# Patient Record
Sex: Female | Born: 1970
Health system: Southern US, Community
[De-identification: ages and names within clinical notes are randomized; demographics above are authoritative.]

## PROBLEM LIST (undated history)

## (undated) ENCOUNTER — Ambulatory Visit (HOSPITAL_COMMUNITY): Admission: EM | Payer: 59

## (undated) DIAGNOSIS — R011 Cardiac murmur, unspecified: Secondary | ICD-10-CM

## (undated) DIAGNOSIS — Z9889 Other specified postprocedural states: Secondary | ICD-10-CM

## (undated) DIAGNOSIS — J45909 Unspecified asthma, uncomplicated: Secondary | ICD-10-CM

## (undated) DIAGNOSIS — F32A Depression, unspecified: Secondary | ICD-10-CM

## (undated) DIAGNOSIS — E079 Disorder of thyroid, unspecified: Secondary | ICD-10-CM

## (undated) DIAGNOSIS — G43909 Migraine, unspecified, not intractable, without status migrainosus: Secondary | ICD-10-CM

## (undated) DIAGNOSIS — E063 Autoimmune thyroiditis: Secondary | ICD-10-CM

## (undated) DIAGNOSIS — L409 Psoriasis, unspecified: Secondary | ICD-10-CM

## (undated) DIAGNOSIS — K219 Gastro-esophageal reflux disease without esophagitis: Secondary | ICD-10-CM

## (undated) DIAGNOSIS — F329 Major depressive disorder, single episode, unspecified: Secondary | ICD-10-CM

## (undated) DIAGNOSIS — T7840XA Allergy, unspecified, initial encounter: Secondary | ICD-10-CM

## (undated) DIAGNOSIS — R112 Nausea with vomiting, unspecified: Secondary | ICD-10-CM

## (undated) DIAGNOSIS — M1990 Unspecified osteoarthritis, unspecified site: Secondary | ICD-10-CM

## (undated) DIAGNOSIS — R7303 Prediabetes: Secondary | ICD-10-CM

## (undated) DIAGNOSIS — Z8489 Family history of other specified conditions: Secondary | ICD-10-CM

## (undated) HISTORY — DX: Migraine, unspecified, not intractable, without status migrainosus: G43.909

## (undated) HISTORY — DX: Disorder of thyroid, unspecified: E07.9

## (undated) HISTORY — PX: BREAST SURGERY: SHX581

## (undated) HISTORY — PX: CHOLECYSTECTOMY: SHX55

## (undated) HISTORY — PX: WISDOM TOOTH EXTRACTION: SHX21

## (undated) HISTORY — DX: Psoriasis, unspecified: L40.9

## (undated) HISTORY — DX: Depression, unspecified: F32.A

## (undated) HISTORY — DX: Allergy, unspecified, initial encounter: T78.40XA

## (undated) HISTORY — DX: Autoimmune thyroiditis: E06.3

## (undated) HISTORY — PX: KNEE ARTHROSCOPY W/ LATERAL RELEASE: SHX1873

## (undated) HISTORY — PX: REDUCTION MAMMAPLASTY: SUR839

## (undated) HISTORY — DX: Unspecified asthma, uncomplicated: J45.909

## (undated) HISTORY — DX: Cardiac murmur, unspecified: R01.1

---
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
## 2015-12-13 ENCOUNTER — Encounter: Payer: Self-pay | Admitting: Gastroenterology

## 2019-07-29 ENCOUNTER — Ambulatory Visit (INDEPENDENT_AMBULATORY_CARE_PROVIDER_SITE_OTHER): Payer: No Typology Code available for payment source | Admitting: Family Medicine

## 2019-07-29 ENCOUNTER — Encounter: Payer: Self-pay | Admitting: Family Medicine

## 2019-07-29 DIAGNOSIS — M7918 Myalgia, other site: Secondary | ICD-10-CM | POA: Diagnosis not present

## 2019-07-29 DIAGNOSIS — J302 Other seasonal allergic rhinitis: Secondary | ICD-10-CM | POA: Diagnosis not present

## 2019-07-29 DIAGNOSIS — F988 Other specified behavioral and emotional disorders with onset usually occurring in childhood and adolescence: Secondary | ICD-10-CM | POA: Diagnosis not present

## 2019-07-29 DIAGNOSIS — G43909 Migraine, unspecified, not intractable, without status migrainosus: Secondary | ICD-10-CM

## 2019-07-29 DIAGNOSIS — L409 Psoriasis, unspecified: Secondary | ICD-10-CM | POA: Insufficient documentation

## 2019-07-29 DIAGNOSIS — M542 Cervicalgia: Secondary | ICD-10-CM | POA: Insufficient documentation

## 2019-07-29 DIAGNOSIS — E063 Autoimmune thyroiditis: Secondary | ICD-10-CM | POA: Insufficient documentation

## 2019-07-29 DIAGNOSIS — J45909 Unspecified asthma, uncomplicated: Secondary | ICD-10-CM | POA: Insufficient documentation

## 2019-07-29 DIAGNOSIS — Z7689 Persons encountering health services in other specified circumstances: Secondary | ICD-10-CM

## 2019-07-29 DIAGNOSIS — J4520 Mild intermittent asthma, uncomplicated: Secondary | ICD-10-CM

## 2019-07-29 NOTE — Progress Notes (Signed)
Virtual Visit via Video Note  I connected with Barbara Wood on 07/29/19 at 11:00 AM EDT by a video enabled telemedicine application and verified that I am speaking with the correct person using two identifiers. Location patient: home Location provider: home office Persons participating in the virtual visit: patient, provider  I discussed the limitations of evaluation and management by telemedicine and the availability of in person appointments. The patient expressed understanding and agreed to proceed.  Chief Complaint  Patient presents with  . Establish Care  . Allergies     HPI: Barbara Wood is a 49 y.o. female to establish care with our office. 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 She is married, 2 daughters (2 grandchildren), 3 dogs.  She has a h/o hypothyroidism, diagnosed in her 77's. She has allergies, asthma, and migraine headaches (complex migraines). She needs referral to allergy and neuro. Pt also notes lots of "arthritis issues" along with strong fam hx of autoimmune disorders. She has never seen rheum.  Pt states she is taking zyrtec BID, rhinocort 1 spray BID, singulair 10mg  daily but is still having significant allergy symptoms. This is triggering her asthma and is requiring her to use her albuterol inhaler once daily most days. She has a steroid inhaler but typically uses it spring and fall. She was recommended to have allergy shots in the past but has not been able to follow thru with this as she has moved often.   She takes skelaxin, phenergan or zofran, and rarely norco for migraines. She cannot tolerate triptans. She has taken 1 norco tab in the past 2 mo and less than 30 tabs per year. No refills needed at this time. She is on atenolol 100mg  BID for prophylaxis. She gets 1-2 migraines per week, previously 5/wk without atenolol. She tried botox injections in the past, but she had 3 days of consistent headache after each treatment.  She is interested  in finding a D.O. who does OMT specifically related to myofascial release treating cervical spine and upper thoracic region as muscular tightness in this area increases her migraine frequency.  She had ADD diagnosed via neuropsych testing and she has been on meds since college. She was on adderall x 3-4 years and has been on vyvanse 50mg  daily x 3 years.  Past Medical History:  Diagnosis Date  . Allergy Age 13  . Asthma Since age 89  . Depression Age 59  . Heart murmur Mitral valve prolapse age 78  . Thyroid disease Since age 23    Past Surgical History:  Procedure Laterality Date  . BREAST SURGERY  Reduction age 36  . CHOLECYSTECTOMY  Age 29    Family History  Problem Relation Age of Onset  . Asthma Sister   . Miscarriages / Stillbirths Sister   . Cancer Mother   . Depression Mother     Social History   Tobacco Use  . Smoking status: Never Smoker  . Smokeless tobacco: Never Used  Substance Use Topics  . Alcohol use: Yes    Comment: 1-2 drinks per month  . Drug use: Never     Current Outpatient Medications:  .  albuterol (PROVENTIL HFA) 108 (90 Base) MCG/ACT inhaler, , Disp: , Rfl:  .  ARIPiprazole (ABILIFY) 5 MG tablet, Take 1 tablet by mouth daily., Disp: , Rfl:  .  atenolol (TENORMIN) 100 MG tablet, Take 100 mg by mouth 2 (two) times daily., Disp: , Rfl:  .  Budesonide (RHINOCORT  ALLERGY NA), Use 1 spray in each nostril twice daily., Disp: , Rfl:  .  cetirizine (ZYRTEC ALLERGY) 10 MG tablet, Take 1 tablet by mouth 2 (two) times a day., Disp: , Rfl:  .  DICLOFENAC PO, Take 75 mg by mouth as needed., Disp: , Rfl:  .  HYDROcodone-acetaminophen (NORCO/VICODIN) 5-325 MG tablet, Take by mouth., Disp: , Rfl:  .  levothyroxine (SYNTHROID) 125 MCG tablet, Take 1 tablet by mouth daily., Disp: , Rfl:  .  lidocaine (LIDODERM) 5 %, , Disp: , Rfl:  .  liothyronine (CYTOMEL) 5 MCG tablet, Take 1 tablet by mouth 3 (three) times daily., Disp: , Rfl:  .  metaxalone (SKELAXIN) 800 MG  tablet, Take by mouth., Disp: , Rfl:  .  montelukast (SINGULAIR) 10 MG tablet, Take 1 tablet by mouth at bedtime., Disp: , Rfl:  .  promethazine (PHENERGAN) 25 MG tablet, , Disp: , Rfl:  .  traMADol (ULTRAM) 50 MG tablet, Take 100 mg by mouth 2 (two) times a day., Disp: , Rfl:  .  VYVANSE 50 MG capsule, Take 1 capsule by mouth daily., Disp: , Rfl:   Allergies  Allergen Reactions  . Amoxicillin-Pot Clavulanate Nausea And Vomiting  . Sertraline Other (See Comments)    hallucinations   . Sulfamethoxazole-Trimethoprim Hives  . Triptans Other (See Comments)    Chest pain       ROS: See pertinent positives and negatives per HPI.   EXAM:  VITALS per patient if applicable: There were no vitals taken for this visit.   GENERAL: alert, oriented, appears well and in no acute distress  HEENT: atraumatic, conjunctiva clear, no obvious abnormalities on inspection of external nose and ears  NECK: normal movements of the head and neck  LUNGS: on inspection no signs of respiratory distress, breathing rate appears normal, no obvious gross SOB, gasping or wheezing, no conversational dyspnea  CV: no obvious cyanosis  MS: moves all visible extremities without noticeable abnormality  PSYCH/NEURO: pleasant and cooperative, no obvious depression or anxiety, speech and thought processing grossly intact   ASSESSMENT AND PLAN: 1. Encounter to establish care with new doctor  2. Seasonal allergies - pt will increase use of nasal saline spray - pt will increase rhinocort to 2 sprays BID x 2 wks - switch from zyrtec to xyzal  - cont singulair, PRN benadryl - Ambulatory referral to Allergy  3. Myofascial pain - pt looking for D.O. who does OMT, specifically related to myofascial release to treat cervical and upper thoracic regions. I explained to pt that I do not do OMT but will check with Dr. Zigmund Daniel to see if he does  4. Migraine without status migrainosus, not intractable, unspecified  migraine type - cont atenolol 100mg  BID, as well as PRN skelaxin, norco, phenergan - Ambulatory referral to Neurology  5. Attention deficit disorder, unspecified hyperactivity presence - stable x 3 years on Vyvanse 50mg  daily - no refill needed at this time, but will refill when appropriate  6. Mild intermittent asthma without complication - cont with albuterol PRN - Ambulatory referral to Allergy    I discussed the assessment and treatment plan with the patient. The patient was provided an opportunity to ask questions and all were answered. The patient agreed with the plan and demonstrated an understanding of the instructions.   The patient was advised to call back or seek an in-person evaluation if the symptoms worsen or if the condition fails to improve as anticipated.   Letta Median, DO

## 2019-08-21 ENCOUNTER — Other Ambulatory Visit: Payer: Self-pay | Admitting: Family Medicine

## 2019-08-21 MED ORDER — NURTEC 75 MG PO TBDP: 75.0000 mg | ORAL_TABLET | Freq: Every day | ORAL | 1 refills | Status: DC | PRN

## 2019-08-21 MED ORDER — SYNTHROID 125 MCG PO TABS: 125.0000 ug | ORAL_TABLET | Freq: Every day | ORAL | 3 refills | Status: DC

## 2019-08-21 NOTE — Telephone Encounter (Signed)
Dr.C please advise

## 2019-08-21 NOTE — Telephone Encounter (Signed)
Both Rx sent to pharm on file

## 2019-08-21 NOTE — Telephone Encounter (Signed)
Requested medication (s) are due for refill today:  yes  Requested medication (s) are on the active medication list:  yes  Future visit scheduled:  No   Last Refill: historical provider  Note to Clinic:  Pt. Needs the brand name Synthroid.  Also is requesting Rx for [name] 75 mg. Tablet, for Migraine headache.  Stated she could not remember the name of this when she had Virtual appt. With Dr. Bryan Lemma.  Requested Prescriptions  Pending Prescriptions Disp Refills   levothyroxine (SYNTHROID) 125 MCG tablet       Sig: Take 1 tablet (125 mcg total) by mouth daily.     Endocrinology:  Hypothyroid Agents Failed - 08/21/2019 12:22 PM      Failed - TSH needs to be rechecked within 3 months after an abnormal result. Refill until TSH is due.      Failed - TSH in normal range and within 360 days    No results found for: TSH       Passed - Valid encounter within last 12 months    Recent Outpatient Visits          3 weeks ago Seasonal allergies   [facility], [place], [place]

## 2019-08-21 NOTE — Telephone Encounter (Signed)
Call placed to pt. To discuss her request for refill on Levothyroxine and [name].  Pt. wanted to make Dr. Bryan Lemma aware of the name of the migraine medication -[name] 75 mg.  Reported her previous dose was to take one tab prn for migraine headache, and could take up to 2 x/ day.  Also stated she needs to have the brand name Synthroid, and not the generic.   Will forward pt's request to Dr. Bryan Lemma.

## 2019-08-21 NOTE — Telephone Encounter (Signed)
Sent mychart message to pt to make her aware.

## 2019-08-21 NOTE — Telephone Encounter (Signed)
Medication: levothyroxine (SYNTHROID) 125 MCG tablet [id number] , Nurtic 75mg -Dr C. Is not the historical provider for these medications.   Has the patient contacted their pharmacy? Yes  (Agent: If no, request that the patient contact the pharmacy for the refill.) (Agent: If yes, when and what did the pharmacy advise?)  Preferred Pharmacy (with phone number or street name): [id number] - [place], [place] [phone] (Phone) [phone] (Fax    Agent: Please be advised that RX refills may take up to 3 business days. We ask that you follow-up with your pharmacy.

## 2019-09-02 ENCOUNTER — Ambulatory Visit: Payer: No Typology Code available for payment source | Admitting: Allergy

## 2019-09-03 ENCOUNTER — Telehealth: Payer: Self-pay | Admitting: Family Medicine

## 2019-09-03 DIAGNOSIS — L0292 Furuncle, unspecified: Secondary | ICD-10-CM

## 2019-09-03 MED ORDER — CEPHALEXIN 500 MG PO CAPS: 500.0000 mg | ORAL_CAPSULE | Freq: Two times a day (BID) | ORAL | 0 refills | Status: AC

## 2019-09-03 NOTE — Telephone Encounter (Signed)
Patient called and complains of a "bad boil" on her lower abdomen x 3 days. Increasing pain, redness, slightly warm to touch. No drainage. No fever, chills, n/v. Pt had not done anything or applied anything. 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 Agreed to try abx and warm compress BID but advised pt if symptoms worsen (spreading redness, fever, chills, increasing pain) especially after 48 hrs on abx, she need to be seen. Will send Rx for keflex x 10 days to pharm on file. Pt will start warm compress BID and ibuprofen.

## 2019-09-10 ENCOUNTER — Telehealth: Payer: Self-pay

## 2019-09-10 NOTE — Telephone Encounter (Signed)
Questions for Screening COVID-19  Symptom onset: None  Travel or Contacts: None  During this illness, did/does the patient experience any of the following symptoms? Fever >100.62F []   Yes [x]   No []   Unknown Subjective fever (felt feverish) []   Yes [x]   No []   Unknown Chills []   Yes [x]   No []   Unknown Muscle aches (myalgia) []   Yes [x]   No []   Unknown Runny nose (rhinorrhea) []   Yes [x]   No []   Unknown Sore throat []   Yes [x]   No []   Unknown Cough (new onset or worsening of chronic cough) []   Yes [x]   No []   Unknown Shortness of breath (dyspnea) []   Yes [x]   No []   Unknown Nausea or vomiting []   Yes [x]   No []   Unknown Headache []   Yes [x]   No []   Unknown Abdominal pain  []   Yes [x]   No []   Unknown Diarrhea (?3 loose/looser than normal stools/24hr period) []   Yes [x]   No []   Unknown Other, specify:  Patient risk factors: Smoker? []   Current []   Former []   Never If female, currently pregnant? []   Yes []   No  Patient Active Problem List   Diagnosis Date Noted  . Asthma 07/29/2019  . Hashimoto's disease 07/29/2019  . Myofascial pain 07/29/2019  . Neck pain 07/29/2019  . Psoriasis 07/29/2019  . Migraine 07/26/1986    Plan:  []   High risk for COVID-19 with red flags go to ED (with CP, SOB, weak/lightheaded, or fever > 101.5). Call ahead.  []   High risk for COVID-19 but stable. Inform provider and coordinate time for [facility] visit.   []   No red flags but URI signs or symptoms okay for [facility] visit.

## 2019-09-11 ENCOUNTER — Ambulatory Visit: Payer: No Typology Code available for payment source | Admitting: Family Medicine

## 2019-09-21 ENCOUNTER — Other Ambulatory Visit: Payer: Self-pay | Admitting: Family Medicine

## 2019-09-21 ENCOUNTER — Encounter: Payer: Self-pay | Admitting: Family Medicine

## 2019-09-21 MED ORDER — LISDEXAMFETAMINE DIMESYLATE 50 MG PO CAPS: 50.0000 mg | ORAL_CAPSULE | Freq: Every day | ORAL | 0 refills | Status: DC

## 2019-10-07 ENCOUNTER — Encounter: Payer: Self-pay | Admitting: Family Medicine

## 2019-10-08 ENCOUNTER — Encounter: Payer: Self-pay | Admitting: Podiatry

## 2019-10-08 ENCOUNTER — Other Ambulatory Visit: Payer: Self-pay

## 2019-10-08 ENCOUNTER — Ambulatory Visit: Payer: No Typology Code available for payment source | Admitting: Podiatry

## 2019-10-08 VITALS — BP 103/60 | HR 65

## 2019-10-08 DIAGNOSIS — M79671 Pain in right foot: Secondary | ICD-10-CM | POA: Diagnosis not present

## 2019-10-08 DIAGNOSIS — G5761 Lesion of plantar nerve, right lower limb: Secondary | ICD-10-CM | POA: Diagnosis not present

## 2019-10-08 MED ORDER — TRAMADOL HCL 50 MG PO TABS: 100.0000 mg | ORAL_TABLET | Freq: Two times a day (BID) | ORAL | 0 refills | Status: DC

## 2019-10-08 NOTE — Patient Instructions (Signed)

## 2019-10-09 ENCOUNTER — Other Ambulatory Visit: Payer: Self-pay | Admitting: Family Medicine

## 2019-10-09 ENCOUNTER — Encounter: Payer: Self-pay | Admitting: Family Medicine

## 2019-10-09 MED ORDER — TRAMADOL HCL 50 MG PO TABS: 100.0000 mg | ORAL_TABLET | Freq: Two times a day (BID) | ORAL | 0 refills | Status: DC

## 2019-10-09 NOTE — Progress Notes (Signed)
Rx sent yesterday was for #60 which is 15 days supply for pt, not 30 day. New Rx sent now with correct tab number

## 2019-10-19 NOTE — Progress Notes (Signed)
Subjective:   Patient ID: Barbara Wood, female   DOB: 50 y.o.   MRN: [id number]   HPI 49 year old female presents the office today for concerns of right foot pain and concern for neuroma.  She states that she gets burning and numbness of her toes particularly when hiking.  During the day when she is working she does not get significant discomfort.  She had no recent treatment.  No other complaints.   Review of Systems  All other systems reviewed and are negative.  Past Medical History:  Diagnosis Date  . Allergy Age 74  . Asthma Since age 67  . Depression Age 58  . Heart murmur Mitral valve prolapse age 69  . Thyroid disease Since age 23    Past Surgical History:  Procedure Laterality Date  . BREAST SURGERY  Reduction age 81  . CHOLECYSTECTOMY  Age 84     Current Outpatient Medications:  .  albuterol (PROVENTIL HFA) 108 (90 Base) MCG/ACT inhaler, , Disp: , Rfl:  .  ARIPiprazole (ABILIFY) 5 MG tablet, Take 1 tablet by mouth daily., Disp: , Rfl:  .  atenolol (TENORMIN) 100 MG tablet, Take 100 mg by mouth 2 (two) times daily., Disp: , Rfl:  .  Budesonide (RHINOCORT ALLERGY NA), Use 1 spray in each nostril twice daily., Disp: , Rfl:  .  budesonide-formoterol (SYMBICORT) 160-4.5 MCG/ACT inhaler, , Disp: , Rfl:  .  cetirizine (ZYRTEC ALLERGY) 10 MG tablet, Take 1 tablet by mouth 2 (two) times a day., Disp: , Rfl:  .  diclofenac (VOLTAREN) 75 MG EC tablet, , Disp: , Rfl:  .  DICLOFENAC PO, Take 75 mg by mouth as needed., Disp: , Rfl:  .  fluconazole (DIFLUCAN) 150 MG tablet, TAKE 1 TABLET BY MOUTH EVERY 3 DAYS FOR 2 DOSES, Disp: , Rfl:  .  Fluocinolone Acetonide Scalp 0.01 % OIL, , Disp: , Rfl:  .  HYDROcodone-acetaminophen (NORCO/VICODIN) 5-325 MG tablet, Take by mouth., Disp: , Rfl:  .  lidocaine (LIDODERM) 5 %, , Disp: , Rfl:  .  liothyronine (CYTOMEL) 5 MCG tablet, Take 1 tablet by mouth 3 (three) times daily., Disp: , Rfl:  .  lisdexamfetamine (VYVANSE) 50 MG capsule, Take 1  capsule (50 mg total) by mouth daily., Disp: 30 capsule, Rfl: 0 .  metaxalone (SKELAXIN) 800 MG tablet, Take by mouth., Disp: , Rfl:  .  montelukast (SINGULAIR) 10 MG tablet, Take 1 tablet by mouth at bedtime., Disp: , Rfl:  .  promethazine (PHENERGAN) 25 MG tablet, , Disp: , Rfl:  .  Rimegepant Sulfate ([name]) 75 MG TBDP, Take 75 mg by mouth daily as needed., Disp: 30 tablet, Rfl: 1 .  SYNTHROID 125 MCG tablet, Take 1 tablet (125 mcg total) by mouth daily before breakfast., Disp: 90 tablet, Rfl: 3 .  traMADol (ULTRAM) 50 MG tablet, Take 2 tablets (100 mg total) by mouth 2 (two) times daily., Disp: 120 tablet, Rfl: 0  Allergies  Allergen Reactions  . Amoxicillin-Pot Clavulanate Nausea And Vomiting  . Sertraline Other (See Comments)    hallucinations   . Sulfamethoxazole-Trimethoprim Hives  . Triptans Other (See Comments)    Chest pain          Objective:  Physical Exam  General: AAO x3, NAD  Dermatological: Skin is warm, dry and supple bilateral. Nails x 10 are well manicured; remaining integument appears unremarkable at this time. There are no open sores, no preulcerative lesions, no rash or signs of infection present.  Vascular: Dorsalis  Pedis artery and Posterior Tibial artery pedal pulses are 2/4 bilateral with immedate capillary fill time. Pedal hair growth present. No varicosities and no lower extremity edema present bilateral. There is no pain with calf compression, swelling, warmth, erythema.   Neruologic: Grossly intact via light touch bilateral. Vibratory intact via tuning fork bilateral. Protective threshold with Semmes Wienstein monofilament intact to all pedal sites bilateral.  Negative Tinel sign.  Musculoskeletal: There is tenderness on the third interspace of the right foot and there is a palpable neuroma identified with a positive click.  There is no area pinpoint tenderness.  No significant edema.  Muscular strength 5/5 in all groups tested bilateral.  Gait:  Unassisted, Nonantalgic.       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  -Steroid injection performed today.  See procedure note below.  Neuroma pad.  Discussed shoe modifications as well. We discussed surgical intervention if needed.  Also discussed dehydrated alcohol injection.  Procedure: Injection Tendon/Ligament Discussed alternatives, risks, complications and verbal consent was obtained.  Location: Right foot neuroma Skin Prep: Alcohol Injectate: 0.5cc 0.5% marcaine plain, 0.5 cc 2% lidocaine plain and, 1 cc kenalog 10. Disposition: Patient tolerated procedure well. Injection site dressed with a band-aid.  Post-injection care was discussed and return precautions discussed.   Trula Slade DPM

## 2019-10-22 ENCOUNTER — Other Ambulatory Visit: Payer: Self-pay

## 2019-10-22 ENCOUNTER — Ambulatory Visit (INDEPENDENT_AMBULATORY_CARE_PROVIDER_SITE_OTHER): Payer: 59 | Admitting: Family Medicine

## 2019-10-22 ENCOUNTER — Encounter: Payer: Self-pay | Admitting: Family Medicine

## 2019-10-22 DIAGNOSIS — G43909 Migraine, unspecified, not intractable, without status migrainosus: Secondary | ICD-10-CM

## 2019-10-22 DIAGNOSIS — K529 Noninfective gastroenteritis and colitis, unspecified: Secondary | ICD-10-CM

## 2019-10-22 MED ORDER — PROMETHAZINE HCL 25 MG PO TABS: 25.0000 mg | ORAL_TABLET | Freq: Four times a day (QID) | ORAL | 3 refills | Status: DC | PRN

## 2019-10-22 NOTE — Progress Notes (Signed)
Virtual Visit via Telephone Note  I connected with Barbara Wood on 10/22/19 at  1:30 PM EDT by telephone and verified that I am speaking with the correct person using two identifiers.   I discussed the limitations, risks, security and privacy concerns of performing an evaluation and management service by telephone and the availability of in person appointments. I also discussed with the patient that there may be a patient responsible charge related to this service. The patient expressed understanding and agreed to proceed.  Location patient: home Location provider: work or home office Participants present for the call: patient, provider Patient did not have a visit in the prior 7 days to address this/these issue(s).  Chief Complaint  Patient presents with  . Nausea  . Emesis  . Diarrhea    History of Present Illness: Barbara Wood is a 49 y.o. female who complains of 3 days of n/v/d, chills. Pt denies fever, headache, cough, SOB, myalgias, rash. She denies blood in stool. No abdominal cramping. Vomiting has resolved. Still w/ nausea and diarrhea. covid test - negative. Pt states she was exposed last week to a patient who has since tested positive for covid.  She has phenergan for nausea and states that helps but does make her very tired. zofran causes her significant constipation.  No recent abx use. Pt would like to return to work Monday and requests note for this.  Past Medical History:  Diagnosis Date  . Allergy Age 79  . Asthma Since age 49  . Depression Age 50  . Heart murmur Mitral valve prolapse age 11  . Thyroid disease Since age 69    Past Surgical History:  Procedure Laterality Date  . BREAST SURGERY  Reduction age 3  . CHOLECYSTECTOMY  Age 31    Social History   Tobacco Use  . Smoking status: Never Smoker  . Smokeless tobacco: Never Used  Substance Use Topics  . Alcohol use: Yes    Comment: 1-2 drinks per month  . Drug use: Never    Family History  Problem  Relation Age of Onset  . Asthma Sister   . Miscarriages / Stillbirths Sister   . Cancer Mother   . Depression Mother     Outpatient Encounter Medications as of 10/22/2019  Medication Sig  . albuterol (PROVENTIL HFA) 108 (90 Base) MCG/ACT inhaler   . ARIPiprazole (ABILIFY) 5 MG tablet Take 1 tablet by mouth daily.  Marland Kitchen atenolol (TENORMIN) 100 MG tablet Take 100 mg by mouth 2 (two) times daily.  . Budesonide (RHINOCORT ALLERGY NA) Use 1 spray in each nostril twice daily.  . budesonide-formoterol (SYMBICORT) 160-4.5 MCG/ACT inhaler   . cetirizine (ZYRTEC ALLERGY) 10 MG tablet Take 1 tablet by mouth 2 (two) times a day.  . diclofenac (VOLTAREN) 75 MG EC tablet   . DICLOFENAC PO Take 75 mg by mouth as needed.  . fluconazole (DIFLUCAN) 150 MG tablet TAKE 1 TABLET BY MOUTH EVERY 3 DAYS FOR 2 DOSES  . Fluocinolone Acetonide Scalp 0.01 % OIL   . HYDROcodone-acetaminophen (NORCO/VICODIN) 5-325 MG tablet Take by mouth.  . lidocaine (LIDODERM) 5 %   . liothyronine (CYTOMEL) 5 MCG tablet Take 1 tablet by mouth 3 (three) times daily.  Marland Kitchen lisdexamfetamine (VYVANSE) 50 MG capsule Take 1 capsule (50 mg total) by mouth daily.  . metaxalone (SKELAXIN) 800 MG tablet Take by mouth.  . montelukast (SINGULAIR) 10 MG tablet Take 1 tablet by mouth at bedtime.  . promethazine (PHENERGAN) 25 MG tablet Take  1 tablet (25 mg total) by mouth every 6 (six) hours as needed for nausea or vomiting.  . Rimegepant Sulfate ([name]) 75 MG TBDP Take 75 mg by mouth daily as needed.  Marland Kitchen SYNTHROID 125 MCG tablet Take 1 tablet (125 mcg total) by mouth daily before breakfast.  . traMADol (ULTRAM) 50 MG tablet Take 2 tablets (100 mg total) by mouth 2 (two) times daily.  . [DISCONTINUED] promethazine (PHENERGAN) 25 MG tablet    No facility-administered encounter medications on file as of 10/22/2019.      Allergies  Allergen Reactions  . Amoxicillin-Pot Clavulanate Nausea And Vomiting  . Sertraline Other (See Comments)     hallucinations   . Sulfamethoxazole-Trimethoprim Hives  . Triptans Other (See Comments)    Chest pain       ROS: See pertinent positives and negatives per HPI.   Observations/Objective: Patient sounds cheerful and well on the phone. I do not appreciate any SOB. Speech and thought processing are grossly intact. Patient reported vitals:  There were no vitals taken for this visit.   Assessment and Plan:  1. Migraine without status migrainosus, not intractable, unspecified migraine type Refill: - promethazine (PHENERGAN) 25 MG tablet; Take 1 tablet (25 mg total) by mouth every 6 (six) hours as needed for nausea or vomiting.  Dispense: 30 tablet; Refill: 3  2. Gastroenteritis - x 3 days, slow improvement - cont with increased fluid intake, BRAT diet, rest Refill: - promethazine (PHENERGAN) 25 MG tablet; Take 1 tablet (25 mg total) by mouth every 6 (six) hours as needed for nausea or vomiting.  Dispense: 30 tablet; Refill: 3 - work note done and sent via MyChart to patient - f/u PRN or if symptoms worsen or do not continue to improve over the next 3-4 days  I did not refer this patient for an OV in the next 24 hours for this/these issue(s).  I discussed the assessment and treatment plan with the patient. The patient was provided an opportunity to ask questions and all were answered. The patient agreed with the plan and demonstrated an understanding of the instructions.   The patient was advised to call back or seek an in-person evaluation if the symptoms worsen or if the condition fails to improve as anticipated.  I provided 11 minutes of non-face-to-face time during this encounter.   Letta Median, DO

## 2019-11-02 ENCOUNTER — Other Ambulatory Visit: Payer: Self-pay

## 2019-11-02 ENCOUNTER — Ambulatory Visit: Payer: No Typology Code available for payment source | Admitting: Podiatry

## 2019-11-02 DIAGNOSIS — G5761 Lesion of plantar nerve, right lower limb: Secondary | ICD-10-CM | POA: Diagnosis not present

## 2019-11-02 NOTE — Progress Notes (Signed)
Subjective: 49 year old female presents the office today for evaluation of right foot neuroma.  Overall she said that she is doing better the injection was helpful.  She states that she was wearing her hiking boots today and she is able to wear them for about 3-1/2 hours before she started get numbness of the third and fourth toes.  No significant burning. Denies any systemic complaints such as fevers, chills, nausea, vomiting. No acute changes since last appointment, and no other complaints at this time.   Objective: AAO x3, NAD DP/PT pulses palpable bilaterally, CRT less than 3 seconds There is tenderness on the third interspace of the right foot.  There is a clicking sensation with medial to lateral compression metatarsals.  No area pinpoint tenderness.  No edema, erythema.  No pain with calf compression, swelling, warmth, erythema  Assessment: Right foot neuroma  Plan: -All treatment options discussed with the patient including all alternatives, risks, complications.  -Steroid injection performed today.  See procedure note below.  Dispensed and applied neuroma pads to her hiking boot insert.  We discussed other treatment options including alcohol sclerosing injections but she does not want to do this.  She states that if we have to go that route she would rather have surgery.  Discussed with her the surgery.  If she elects to have surgery we will get an ultrasound for surgical planning. -Patient encouraged to call the office with any questions, concerns, change in symptoms.   Trula Slade DPM

## 2019-11-02 NOTE — Patient Instructions (Signed)

## 2019-11-09 ENCOUNTER — Other Ambulatory Visit: Payer: Self-pay

## 2019-11-09 MED ORDER — LIOTHYRONINE SODIUM 5 MCG PO TABS: 5.0000 ug | ORAL_TABLET | Freq: Three times a day (TID) | ORAL | 2 refills | Status: DC

## 2019-11-16 ENCOUNTER — Emergency Department (HOSPITAL_COMMUNITY): Payer: 59

## 2019-11-16 ENCOUNTER — Other Ambulatory Visit: Payer: Self-pay

## 2019-11-16 ENCOUNTER — Emergency Department (HOSPITAL_COMMUNITY)
Admission: EM | Admit: 2019-11-16 | Discharge: 2019-11-17 | Disposition: A | Discharge status: Home / Self Care | Payer: 59 | Attending: Emergency Medicine | Admitting: Emergency Medicine

## 2019-11-16 ENCOUNTER — Other Ambulatory Visit: Payer: Self-pay | Admitting: Family Medicine

## 2019-11-16 ENCOUNTER — Encounter (HOSPITAL_COMMUNITY): Payer: Self-pay | Admitting: Emergency Medicine

## 2019-11-16 ENCOUNTER — Encounter: Payer: Self-pay | Admitting: Family Medicine

## 2019-11-16 DIAGNOSIS — Z79899 Other long term (current) drug therapy: Secondary | ICD-10-CM | POA: Diagnosis not present

## 2019-11-16 DIAGNOSIS — J4541 Moderate persistent asthma with (acute) exacerbation: Secondary | ICD-10-CM | POA: Diagnosis not present

## 2019-11-16 DIAGNOSIS — Z20828 Contact with and (suspected) exposure to other viral communicable diseases: Secondary | ICD-10-CM | POA: Insufficient documentation

## 2019-11-16 DIAGNOSIS — Z20822 Contact with and (suspected) exposure to covid-19: Secondary | ICD-10-CM

## 2019-11-16 DIAGNOSIS — R000 Tachycardia, unspecified: Secondary | ICD-10-CM | POA: Diagnosis not present

## 2019-11-16 DIAGNOSIS — J40 Bronchitis, not specified as acute or chronic: Secondary | ICD-10-CM

## 2019-11-16 DIAGNOSIS — R0602 Shortness of breath: Secondary | ICD-10-CM | POA: Diagnosis not present

## 2019-11-16 DIAGNOSIS — R05 Cough: Secondary | ICD-10-CM | POA: Diagnosis not present

## 2019-11-16 LAB — CBC WITH DIFFERENTIAL/PLATELET
Abs Immature Granulocytes: 0.04 10*3/uL (ref 0.00–0.07)
Basophils Absolute: 0 10*3/uL (ref 0.0–0.1)
Basophils Relative: 0 %
Eosinophils Absolute: 0 10*3/uL (ref 0.0–0.5)
Eosinophils Relative: 0 %
HCT: 38.9 % (ref 36.0–46.0)
Hemoglobin: 12.6 g/dL (ref 12.0–15.0)
Immature Granulocytes: 0 %
Lymphocytes Relative: 10 %
Lymphs Abs: 1 10*3/uL (ref 0.7–4.0)
MCH: 28.2 pg (ref 26.0–34.0)
MCHC: 32.4 g/dL (ref 30.0–36.0)
MCV: 87 fL (ref 80.0–100.0)
Monocytes Absolute: 0.3 10*3/uL (ref 0.1–1.0)
Monocytes Relative: 3 %
Neutro Abs: 8.4 10*3/uL — ABNORMAL HIGH (ref 1.7–7.7)
Neutrophils Relative %: 87 %
Platelets: 287 10*3/uL (ref 150–400)
RBC: 4.47 MIL/uL (ref 3.87–5.11)
RDW: 12.4 % (ref 11.5–15.5)
WBC: 9.8 10*3/uL (ref 4.0–10.5)
nRBC: 0 % (ref 0.0–0.2)

## 2019-11-16 LAB — D-DIMER, QUANTITATIVE: D-Dimer, Quant: 0.3 ug/mL-FEU (ref 0.00–0.50)

## 2019-11-16 LAB — BASIC METABOLIC PANEL
Anion gap: 12 (ref 5–15)
BUN: 12 mg/dL (ref 6–20)
CO2: 20 mmol/L — ABNORMAL LOW (ref 22–32)
Calcium: 9.4 mg/dL (ref 8.9–10.3)
Chloride: 108 mmol/L (ref 98–111)
Creatinine, Ser: 0.78 mg/dL (ref 0.44–1.00)
GFR calc Af Amer: >60 mL/min (ref >60)
GFR calc non Af Amer: >60 mL/min (ref >60)
Glucose, Bld: 173 mg/dL — ABNORMAL HIGH (ref 70–99)
Potassium: 4 mmol/L (ref 3.5–5.1)
Sodium: 140 mmol/L (ref 135–145)

## 2019-11-16 LAB — HEPATIC FUNCTION PANEL
ALT: 18 U/L (ref 0–44)
AST: 23 U/L (ref 15–41)
Albumin: 3.6 g/dL (ref 3.5–5.0)
Alkaline Phosphatase: 81 U/L (ref 38–126)
Bilirubin, Direct: 0.3 mg/dL — ABNORMAL HIGH (ref 0.0–0.2)
Indirect Bilirubin: 0.6 mg/dL (ref 0.3–0.9)
Total Bilirubin: 0.9 mg/dL (ref 0.3–1.2)
Total Protein: 7.1 g/dL (ref 6.5–8.1)

## 2019-11-16 LAB — TROPONIN I (HIGH SENSITIVITY): Troponin I (High Sensitivity): 3 ng/L (ref <18)

## 2019-11-16 MED ORDER — ALBUTEROL SULFATE (2.5 MG/3ML) 0.083% IN NEBU: 5.0000 mg | INHALATION_SOLUTION | Freq: Once | RESPIRATORY_TRACT | Status: DC

## 2019-11-16 MED ORDER — PREDNISONE 20 MG PO TABS: ORAL_TABLET | ORAL | 0 refills | Status: DC

## 2019-11-16 MED ORDER — ALBUTEROL SULFATE HFA 108 (90 BASE) MCG/ACT IN AERS
6.0000 | INHALATION_SPRAY | Freq: Once | RESPIRATORY_TRACT | Status: AC
  Administered 2019-11-16: 23:00:00 6 via RESPIRATORY_TRACT
  Filled 2019-11-16: qty 6.7

## 2019-11-16 MED ORDER — MAGNESIUM SULFATE 2 GM/50ML IV SOLN
2.0000 g | Freq: Once | INTRAVENOUS | Status: AC
  Administered 2019-11-16: 2 g via INTRAVENOUS
  Filled 2019-11-16: qty 50

## 2019-11-16 MED ORDER — SODIUM CHLORIDE 0.9 % IV SOLN
Freq: Once | INTRAVENOUS | Status: AC
  Administered 2019-11-16: 23:00:00 via INTRAVENOUS

## 2019-11-16 MED ORDER — DIPHENHYDRAMINE HCL 25 MG PO CAPS
50.0000 mg | ORAL_CAPSULE | Freq: Once | ORAL | Status: AC
  Administered 2019-11-17: 50 mg via ORAL
  Filled 2019-11-16: qty 2

## 2019-11-16 MED ORDER — DEXAMETHASONE SODIUM PHOSPHATE 10 MG/ML IJ SOLN
10.0000 mg | Freq: Once | INTRAMUSCULAR | Status: AC
  Administered 2019-11-17: 10 mg via INTRAVENOUS
  Filled 2019-11-16: qty 1

## 2019-11-16 MED ORDER — AEROCHAMBER PLUS FLO-VU LARGE MISC
1.0000 | Freq: Once | Status: AC
  Administered 2019-11-16: 23:00:00 1

## 2019-11-16 MED ORDER — ALBUTEROL SULFATE HFA 108 (90 BASE) MCG/ACT IN AERS: 2.0000 | INHALATION_SPRAY | RESPIRATORY_TRACT | 3 refills | Status: DC | PRN

## 2019-11-16 MED ORDER — BENZONATATE 100 MG PO CAPS: 100.0000 mg | ORAL_CAPSULE | Freq: Three times a day (TID) | ORAL | 1 refills | Status: DC | PRN

## 2019-11-16 MED ORDER — IPRATROPIUM BROMIDE HFA 17 MCG/ACT IN AERS
2.0000 | INHALATION_SPRAY | Freq: Once | RESPIRATORY_TRACT | Status: AC
  Administered 2019-11-17: 01:00:00 2 via RESPIRATORY_TRACT
  Filled 2019-11-16: qty 12.9

## 2019-11-16 NOTE — ED Provider Notes (Signed)
[facility] EMERGENCY DEPARTMENT Provider Note   CSN: [id number] Arrival date & time: 11/16/19  2137     History   Chief Complaint Chief Complaint  Patient presents with  . Shortness of Breath    HPI Barbara Wood is a 50 y.o. female with a past medical history of Hashimoto's disease, asthma, depression, ADD, who presents today for evaluation of cough and shortness of breath.  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   She reports that 3 days ago she started developing cough and shortness of breath.  Her cough is productive, she reports it is producing foul tasting sputum that takes like prednisone.  She denies any fevers.  She reports she has taken hydrocodone cough syrup without significant relief in her symptoms.  She reports that her daughter and husband are both sick also, however states that they do not have asthma and her not as severe as she is.  She reports that her sense of taste is diminished, however her sense of smell is increased.  She reports that she coughs so much that she gags, however denies vomiting, constipation, or diarrhea.  No abdominal pain.  She reports that she took 60 mg of prednisone p.o. this morning.  She is also taking a total of 75 mg of Benadryl today.  She has been using her albuterol nebulizer once, and her albuterol inhaler 2 puffs 3 times today without significant relief.  She has never required hospitalization, ER visits, BiPAP or intubation for her asthma before.  She denies any new exposures.       HPI  Past Medical History:  Diagnosis Date  . Allergy Age 53  . Asthma Since age 28  . Depression Age 51  . Heart murmur Mitral valve prolapse age 44  . Thyroid disease Since age 65    Patient Active Problem List   Diagnosis Date Noted  . Asthma 07/29/2019  . Hashimoto's disease 07/29/2019  . Myofascial pain 07/29/2019  . Neck pain 07/29/2019  . Psoriasis 07/29/2019  . Migraine 07/26/1986    Past  Surgical History:  Procedure Laterality Date  . BREAST SURGERY  Reduction age 77  . CHOLECYSTECTOMY  Age 79     OB History   No obstetric history on file.      Home Medications    Prior to Admission medications   Medication Sig Start Date End Date Taking? Authorizing Provider  albuterol (PROVENTIL HFA) 108 (90 Base) MCG/ACT inhaler Inhale 2 puffs into the lungs every 4 (four) hours as needed for wheezing or shortness of breath. 11/16/19   Cirigliano, Mary K, DO  ARIPiprazole (ABILIFY) 5 MG tablet Take 1 tablet by mouth daily. 07/26/10   [provider]  atenolol (TENORMIN) 100 MG tablet Take 100 mg by mouth 2 (two) times daily. 07/23/19   [provider]  benzonatate (TESSALON) 100 MG capsule Take 1 capsule (100 mg total) by mouth 3 (three) times daily as needed for cough. 11/16/19   Cirigliano, Garvin Fila, DO  Budesonide (RHINOCORT ALLERGY NA) Use 1 spray in each nostril twice daily.    [provider]  budesonide-formoterol [employer]) 160-4.5 MCG/ACT inhaler  04/22/19   [provider]  cetirizine (ZYRTEC ALLERGY) 10 MG tablet Take 1 tablet by mouth 2 (two) times a day.    [provider]  diclofenac (VOLTAREN) 75 MG EC tablet  06/09/19   [provider]  DICLOFENAC PO Take 75 mg by mouth as  needed.    [provider]  fluconazole (DIFLUCAN) 150 MG tablet TAKE 1 TABLET BY MOUTH EVERY 3 DAYS FOR 2 DOSES 09/12/19   [provider]  Fluocinolone Acetonide Scalp 0.01 % OIL  05/04/19   [provider]  HYDROcodone-acetaminophen (NORCO/VICODIN) 5-325 MG tablet Take by mouth.    [provider]  HYDROcodone-homatropine (HYCODAN) 5-1.5 MG/5ML syrup Take 5 mLs by mouth every 6 (six) hours as needed for cough. 11/17/19   Lorin Glass, PA-C  lidocaine (LIDODERM) 5 %     [provider]  liothyronine (CYTOMEL) 5 MCG tablet Take 1 tablet (5 mcg total) by mouth 3 (three) times daily. 11/09/19    Cirigliano, Garvin Fila, DO  lisdexamfetamine (VYVANSE) 50 MG capsule Take 1 capsule (50 mg total) by mouth daily. 09/21/19   Luetta Nutting, DO  metaxalone (SKELAXIN) 800 MG tablet Take by mouth.    [provider]  montelukast (SINGULAIR) 10 MG tablet Take 1 tablet by mouth at bedtime.    [provider]  predniSONE (DELTASONE) 20 MG tablet 3 tabs po x 3 days, then 2 tabs po x 3 days, then 1 tab po x 3 days, then 1/2 tab po x 3 days 11/16/19   Cirigliano, Garvin Fila, DO  promethazine (PHENERGAN) 25 MG tablet Take 1 tablet (25 mg total) by mouth every 6 (six) hours as needed for nausea or vomiting. 10/22/19   Cirigliano, Garvin Fila, DO  Rimegepant Sulfate ([name]) 75 MG TBDP Take 75 mg by mouth daily as needed. 08/21/19   Cirigliano, Garvin Fila, DO  SYNTHROID 125 MCG tablet Take 1 tablet (125 mcg total) by mouth daily before breakfast. 08/21/19   Cirigliano, Garvin Fila, DO  traMADol (ULTRAM) 50 MG tablet Take 2 tablets (100 mg total) by mouth 2 (two) times daily. 10/09/19   CiriglianoGarvin Fila, DO    Family History Family History  Problem Relation Age of Onset  . Asthma Sister   . Miscarriages / Stillbirths Sister   . Cancer Mother   . Depression Mother     Social History Social History   Tobacco Use  . Smoking status: Never Smoker  . Smokeless tobacco: Never Used  Substance Use Topics  . Alcohol use: Yes    Comment: 1-2 drinks per month  . Drug use: Never     Allergies   Amoxicillin-pot clavulanate, Sertraline, Sulfamethoxazole-trimethoprim, and Triptans   Review of Systems Review of Systems  Constitutional: Negative for chills and fever.  HENT: Negative for congestion, sinus pressure and sinus pain.   Eyes: Negative for visual disturbance.  Respiratory: Positive for cough, chest tightness, shortness of breath and wheezing.   Cardiovascular: Negative for palpitations and leg swelling.  Gastrointestinal: Negative for abdominal pain, diarrhea, nausea and vomiting.  Genitourinary:  Negative for dysuria.  Musculoskeletal: Negative for back pain and neck pain.  Skin: Negative for color change, rash and wound.  Neurological: Negative for weakness and headaches.  Psychiatric/Behavioral: Negative for confusion.  All other systems reviewed and are negative.    Physical Exam Updated Vital Signs BP 119/66   Pulse 92   Temp 97.8 F (36.6 C) (Oral)   Resp 17   Ht 5\' 7"  (1.702 m)   Wt 95.3 kg   SpO2 97%   BMI 32.89 kg/m   Physical Exam Vitals signs and nursing note reviewed.  Constitutional:      General: She is not in acute distress.    Appearance: She is well-developed.  HENT:  Head: Normocephalic and atraumatic.  Eyes:     Conjunctiva/sclera: Conjunctivae normal.  Neck:     Musculoskeletal: Neck supple.  Cardiovascular:     Rate and Rhythm: Regular rhythm. Tachycardia present.     Pulses:          Radial pulses are 2+ on the right side and 2+ on the left side.       Dorsalis pedis pulses are 2+ on the right side and 2+ on the left side.       Posterior tibial pulses are 2+ on the right side and 2+ on the left side.     Heart sounds: No murmur.  Pulmonary:     Effort: Tachypnea and accessory muscle usage (mild) present. No respiratory distress.     Breath sounds: Examination of the right-upper field reveals wheezing. Examination of the left-upper field reveals wheezing. Examination of the right-middle field reveals wheezing. Examination of the left-middle field reveals wheezing. Examination of the right-lower field reveals wheezing. Examination of the left-lower field reveals wheezing. Wheezing present.  Chest:     Chest wall: No tenderness.  Abdominal:     Palpations: Abdomen is soft.     Tenderness: There is no abdominal tenderness.  Musculoskeletal:     Right lower leg: She exhibits no tenderness. No edema.     Left lower leg: She exhibits no tenderness. No edema.  Skin:    General: Skin is warm and dry.  Neurological:     General: No focal  deficit present.     Mental Status: She is alert.     Cranial Nerves: No cranial nerve deficit.  Psychiatric:        Mood and Affect: Mood normal.        Behavior: Behavior normal.      ED Treatments / Results  Labs (all labs ordered are listed, but only abnormal results are displayed) Labs Reviewed  CBC WITH DIFFERENTIAL/PLATELET - Abnormal; Notable for the following components:      Result Value   Neutro Abs 8.4 (*)    All other components within normal limits  BASIC METABOLIC PANEL - Abnormal; Notable for the following components:   CO2 20 (*)    Glucose, Bld 173 (*)    All other components within normal limits  HEPATIC FUNCTION PANEL - Abnormal; Notable for the following components:   Bilirubin, Direct 0.3 (*)    All other components within normal limits  SARS CORONAVIRUS 2 (TAT 6-24 HRS)  RESPIRATORY PANEL BY PCR  D-DIMER, QUANTITATIVE (NOT AT [facility])  I-STAT BETA HCG BLOOD, ED ([facility], [facility], [facility] ONLY)  TROPONIN I (HIGH SENSITIVITY)  TROPONIN I (HIGH SENSITIVITY)    EKG EKG Interpretation  Date/Time:  Monday November 16 2019 21:51:04 EST Ventricular Rate:  106 PR Interval:  148 QRS Duration: 84 QT Interval:  332 QTC Calculation: 441 R Axis:   28 Text Interpretation: Sinus tachycardia Cannot rule out Anterior infarct , age undetermined Abnormal ECG No previous ECGs available Confirmed by Wandra Arthurs [id number]) on 11/16/2019 10:23:58 PM   Radiology Dg Chest 2 View  Result Date: 11/16/2019 CLINICAL DATA:  Cough, shortness of breath EXAM: CHEST - 2 VIEW COMPARISON:  None. FINDINGS: Heart and mediastinal contours are within normal limits. No focal opacities or effusions. No acute bony abnormality. IMPRESSION: No active cardiopulmonary disease. Electronically Signed   By: Rolm Baptise M.D.   On: 11/16/2019 22:15    Procedures Procedures (including critical care time)  Medications Ordered in ED  Medications  AeroChamber Plus Flo-Vu Large MISC 1 each (1 each Other Given  11/16/19 2317)  ipratropium (ATROVENT HFA) inhaler 2 puff (2 puffs Inhalation Given 11/17/19 0053)  albuterol (VENTOLIN HFA) 108 (90 Base) MCG/ACT inhaler 6 puff (6 puffs Inhalation Given 11/16/19 2307)  magnesium sulfate IVPB 2 g 50 mL (0 g Intravenous Stopped 11/17/19 0006)  0.9 %  sodium chloride infusion ( Intravenous Stopped 11/17/19 0243)  dexamethasone (DECADRON) injection 10 mg (10 mg Intravenous Given 11/17/19 0013)  diphenhydrAMINE (BENADRYL) capsule 50 mg (50 mg Oral Given 11/17/19 0012)     Initial Impression / Assessment and Plan / ED Course  I have reviewed the triage vital signs and the nursing notes.  Pertinent labs & imaging results that were available during my care of the patient were reviewed by me and considered in my medical decision making (see chart for details).  Clinical Course as of Nov 17 243  Mon Nov 16, 2019  2353 Patient reevaluated lung sounds are slightly improved.  She just got her albuterol, still is waiting ipratropium and Decadron.  She last had Benadryl about 6 hours ago and is requesting additional dose due to rhinorrhea.  P.o. Benadryl ordered.   [EH]  Tue Nov 17, 2019  0126 Patient was able to ambulate at bedside without shortness of breath or desaturation.  She feels better.  Lungs are significantly improved.   [EH]  [id number] Troponin I (High Sensitivity) [EH]    Clinical Course User Index [EH] Lorin Glass, PA-C      Patient presents today for evaluation of shortness of breath and wheezing. Over the past 3 days she has had worsening cough and shortness of breath that is productive. On initial evaluation she had significant diffuse bilateral wheezing. While in the emergency room she was treated with albuterol, IV Decadron, Atrovent, IV magnesium, p.o. Benadryl and given maintenance fluids. Labs are obtained and reviewed, troponin x2 is normal. D-dimer is not elevated. BMP shows mild hyperglycemia at 173, CBC is only remarkable for  neutrophils of 8.4 which I suspect is secondary to prednisone this morning. Chest x-ray without evidence of infiltrate, edema or other acute abnormalities. EKG without evidence of ischemia, arrhythmia, or other cause for her symptoms today.  Given CXR with out infiltrate, normal white count and afebrile no indication for antibiotics at this time.   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 Coronavirus test and RVP were both sent.    After treatment patient was able to ambulate at the bedside without hypoxia or becoming symptomatic and reported feeling overall much better.    She already has prescriptions at home for p.o. steroids, albuterol. She is given a prescription for Hycodan cough syrup (after [place] PMP was consulted)  for use at night as needed. She did have mild tachycardia, however this is attributable to albuterol use in the setting of negative D-dimer.   Return precautions were discussed with patient who states their understanding.  At the time of discharge patient denied any unaddressed complaints or concerns.  Patient is agreeable for discharge home.  Marilou Bo was evaluated in Emergency Department on 11/17/2019 for the symptoms described in the history of present illness. She was evaluated in the context of the global COVID-19 pandemic, which necessitated consideration that the patient might be at risk for infection with the SARS-CoV-2 virus that causes COVID-19. Institutional protocols and algorithms that pertain to the evaluation of patients at risk for COVID-19  are in a state of rapid change based on information released by regulatory bodies including the CDC and federal and state organizations. These policies and algorithms were followed during the patient's care in the ED.    Final Clinical Impressions(s) / ED Diagnoses   Final diagnoses:  Moderate persistent asthma with exacerbation  Suspected COVID-19 virus infection     ED Discharge Orders         Ordered    HYDROcodone-homatropine (HYCODAN) 5-1.5 MG/5ML syrup  Every 6 hours PRN     11/17/19 0231           Lorin Glass, PA-C 11/17/19 [id number]    Merryl Hacker, MD 11/17/19 [phone]

## 2019-11-16 NOTE — ED Triage Notes (Addendum)
Pt here for SOB since today. Pt tested for COVID today. Results are not back yet. Pt has been taking her nebulizer treatments and inhalers at home. Pt has a dry cough. No known COVID exposures. Pt has history of asthma.

## 2019-11-17 ENCOUNTER — Encounter: Payer: Self-pay | Admitting: Family Medicine

## 2019-11-17 ENCOUNTER — Encounter (HOSPITAL_COMMUNITY): Payer: Self-pay

## 2019-11-17 DIAGNOSIS — J4541 Moderate persistent asthma with (acute) exacerbation: Secondary | ICD-10-CM | POA: Diagnosis not present

## 2019-11-17 DIAGNOSIS — Z20828 Contact with and (suspected) exposure to other viral communicable diseases: Secondary | ICD-10-CM | POA: Diagnosis not present

## 2019-11-17 DIAGNOSIS — Z79899 Other long term (current) drug therapy: Secondary | ICD-10-CM | POA: Diagnosis not present

## 2019-11-17 LAB — RESPIRATORY PANEL BY PCR

## 2019-11-17 LAB — I-STAT BETA HCG BLOOD, ED (MC, WL, AP ONLY): I-stat hCG, quantitative: <5 m[IU]/mL (ref <5)

## 2019-11-17 LAB — TROPONIN I (HIGH SENSITIVITY): Troponin I (High Sensitivity): 5 ng/L (ref <18)

## 2019-11-17 LAB — SARS CORONAVIRUS 2 (TAT 6-24 HRS): SARS Coronavirus 2: NEGATIVE

## 2019-11-17 MED ORDER — HYDROCODONE-HOMATROPINE 5-1.5 MG/5ML PO SYRP: 5.0000 mL | ORAL_SOLUTION | Freq: Four times a day (QID) | ORAL | 0 refills | Status: DC | PRN

## 2019-11-17 NOTE — ED Notes (Signed)
Patient verbalizes understanding of discharge instructions. Opportunity for questioning and answers were provided. Armband removed by staff, pt discharged from ED ambulatory.   

## 2019-11-17 NOTE — Discharge Instructions (Addendum)
Thank you for allowing me to take care of you today.  You may use your atrovent (ipratropium) inhaler 2 puffs every 6 hours as needed, however this does not replace albuterol and albuterol should remain your primary rescue type inhaler.    As you know benadryl and hycodan may make you sleepy so please do not drive or operate heavy equipment while taking it.    You have a COVID and respiratory viral panel pending.   Please quarantine at home.   If your symptoms worsen, or you have additional concerns please seek additional medical care and evaluation.  If your test is negative and you still have symptoms I would recommend getting retested in three days.

## 2019-11-22 ENCOUNTER — Encounter: Payer: Self-pay | Admitting: Family Medicine

## 2019-11-23 ENCOUNTER — Other Ambulatory Visit: Payer: Self-pay | Admitting: Family Medicine

## 2019-11-23 NOTE — Telephone Encounter (Signed)
Medication: traMADol (ULTRAM) 50 MG tablet [id number] , lisdexamfetamine (VYVANSE) 50 MG capsule [id number]   Has the patient contacted their pharmacy? YES (Agent: If no, request that the patient contact the pharmacy for the refill.) (Agent: If yes, when and what did the pharmacy advise?)  Preferred Pharmacy (with phone number or street name): [id number] [place], [place] [phone] (Phone) [phone] (Fax)    Agent: Please be advised that RX refills may take up to 3 business days. We ask that you follow-up with your pharmacy.

## 2019-11-24 NOTE — Telephone Encounter (Signed)
Patient is calling about her medication refill she is all out and would like the prescription to be refilled as soon as possible

## 2019-11-24 NOTE — Telephone Encounter (Signed)
Requested medication (s) are due for refill today: tramadol yes  Requested medication (s) are on the active medication list: yes  Last refill: 10/09/2019  Future visit scheduled: no  Notes to clinic: not delegated  Requested medication (s) are due for refill today: lisedxamfetamine  Requested medication (s) are on the active medication list: yes  Last refill: 09/21/2019  Future visit scheduled: no  Notes to clinic: not delegated   Requested Prescriptions  Pending Prescriptions Disp Refills   traMADol (ULTRAM) 50 MG tablet 120 tablet 0    Sig: Take 2 tablets (100 mg total) by mouth 2 (two) times daily.     Not Delegated - Analgesics:  Opioid Agonists Failed - 11/23/2019  4:43 PM      Failed - This refill cannot be delegated      Failed - Urine Drug Screen completed in last 360 days.      Passed - Valid encounter within last 6 months    Recent Outpatient Visits          1 month ago Migraine without status migrainosus, not intractable, unspecified migraine type   [facility] [place], [place] K, DO   3 months ago Seasonal allergies   [facility] [place], Mary K, DO              lisdexamfetamine (VYVANSE) 50 MG capsule 30 capsule 0    Sig: Take 1 capsule (50 mg total) by mouth daily.     Not Delegated - Psychiatry:  Stimulants/ADHD Failed - 11/23/2019  4:43 PM      Failed - This refill cannot be delegated      Failed - Urine Drug Screen completed in last 360 days.      Passed - Valid encounter within last 3 months    Recent Outpatient Visits          1 month ago Migraine without status migrainosus, not intractable, unspecified migraine type   [facility] [place], [place] K, DO   3 months ago Seasonal allergies   [facility] [place] [place], [place]

## 2019-11-25 MED ORDER — LISDEXAMFETAMINE DIMESYLATE 50 MG PO CAPS: 50.0000 mg | ORAL_CAPSULE | Freq: Every day | ORAL | 0 refills | Status: DC

## 2019-11-25 MED ORDER — TRAMADOL HCL 50 MG PO TABS: 100.0000 mg | ORAL_TABLET | Freq: Two times a day (BID) | ORAL | 0 refills | Status: DC

## 2019-11-30 ENCOUNTER — Other Ambulatory Visit: Payer: Self-pay

## 2019-11-30 ENCOUNTER — Ambulatory Visit: Payer: No Typology Code available for payment source | Admitting: Podiatry

## 2019-11-30 DIAGNOSIS — G5761 Lesion of plantar nerve, right lower limb: Secondary | ICD-10-CM | POA: Diagnosis not present

## 2019-12-06 DIAGNOSIS — G5761 Lesion of plantar nerve, right lower limb: Secondary | ICD-10-CM | POA: Insufficient documentation

## 2019-12-06 NOTE — Progress Notes (Signed)
Subjective: 49 year old female presents the office today for evaluation of right foot neuroma.  She states that overall she is doing much better.  She has been wearing her hiking boots more and she can wear them for at least 3 to 4 hours.  She has no pain currently injections have been helpful.  She has no new concerns denies any recent changes otherwise. Denies any systemic complaints such as fevers, chills, nausea, vomiting. No acute changes since last appointment, and no other complaints at this time.   Objective: AAO x3, NAD DP/PT pulses palpable bilaterally, CRT less than 3 seconds There is currently no tenderness on the third interspace of the right foot.  There is a minimal clicking sensation with medial to lateral compression metatarsals.  No area pinpoint tenderness.  No edema, erythema.  No pain with calf compression, swelling, warmth, erythema  Assessment: Right foot neuroma-with improvement  Plan: -All treatment options discussed with the patient including all alternatives, risks, complications.  -Injections have been helpful.  Will hold off another injection today.  I do want her to wear an insert inside of her hiking boots and we can include a neuroma pad.  Return if symptoms worsen or fail to improve.  Trula Slade DPM

## 2019-12-21 ENCOUNTER — Other Ambulatory Visit: Payer: Self-pay | Admitting: Family Medicine

## 2019-12-21 ENCOUNTER — Encounter: Payer: Self-pay | Admitting: Family Medicine

## 2019-12-21 MED ORDER — LISDEXAMFETAMINE DIMESYLATE 50 MG PO CAPS: 50.0000 mg | ORAL_CAPSULE | Freq: Every day | ORAL | 0 refills | Status: DC

## 2019-12-21 MED ORDER — TRAMADOL HCL 50 MG PO TABS: 100.0000 mg | ORAL_TABLET | Freq: Two times a day (BID) | ORAL | 0 refills | Status: DC

## 2020-01-02 ENCOUNTER — Other Ambulatory Visit: Payer: Self-pay | Admitting: Family Medicine

## 2020-01-08 ENCOUNTER — Telehealth: Payer: Self-pay | Admitting: Family Medicine

## 2020-01-08 MED ORDER — LISDEXAMFETAMINE DIMESYLATE 50 MG PO CAPS: 50.0000 mg | ORAL_CAPSULE | Freq: Every day | ORAL | 0 refills | Status: DC

## 2020-01-08 MED ORDER — TRAMADOL HCL 50 MG PO TABS: 100.0000 mg | ORAL_TABLET | Freq: Two times a day (BID) | ORAL | 0 refills | Status: DC

## 2020-01-08 NOTE — Telephone Encounter (Signed)
Last OV 10/22/19 Last fill for both 12/21/19 #30/0   #120/0

## 2020-01-08 NOTE — Telephone Encounter (Signed)
Rx refilled as requested.  

## 2020-01-08 NOTE — Telephone Encounter (Signed)
Patient is calling and wanted to get a medication refill for Tramadol and [facility]se [facility] CB is [phone].

## 2020-01-13 ENCOUNTER — Telehealth: Payer: Self-pay | Admitting: Family Medicine

## 2020-01-13 NOTE — Telephone Encounter (Signed)
Please call pts pharm ([facility]) to give permission to fill pts tramadol and vyvanse early as pt is going out of town 1/16-1/24. Refills were sent in on 01/08/20 but verbal ok/permission is needed from office to fill.

## 2020-01-15 NOTE — Telephone Encounter (Signed)
Pt is aware of potential insurance issue.

## 2020-01-15 NOTE — Telephone Encounter (Signed)
I called pharmacy and they informed me that insurance may not cover for med to be filled this early.  Patient may have to pay out of pocket. They placed the verbal order to have it filled.

## 2020-02-04 ENCOUNTER — Other Ambulatory Visit: Payer: Self-pay

## 2020-02-05 ENCOUNTER — Other Ambulatory Visit: Payer: Self-pay | Admitting: Family Medicine

## 2020-02-05 ENCOUNTER — Ambulatory Visit (INDEPENDENT_AMBULATORY_CARE_PROVIDER_SITE_OTHER): Payer: 59 | Admitting: Family Medicine

## 2020-02-05 ENCOUNTER — Encounter: Payer: Self-pay | Admitting: Family Medicine

## 2020-02-05 VITALS — BP 124/80 | HR 82 | Temp 96.7°F | Ht 67.0 in | Wt 219.0 lb

## 2020-02-05 DIAGNOSIS — R140 Abdominal distension (gaseous): Secondary | ICD-10-CM

## 2020-02-05 DIAGNOSIS — D2260 Melanocytic nevi of unspecified upper limb, including shoulder: Secondary | ICD-10-CM

## 2020-02-05 DIAGNOSIS — R5383 Other fatigue: Secondary | ICD-10-CM | POA: Diagnosis not present

## 2020-02-05 DIAGNOSIS — L409 Psoriasis, unspecified: Secondary | ICD-10-CM | POA: Diagnosis not present

## 2020-02-05 DIAGNOSIS — G43909 Migraine, unspecified, not intractable, without status migrainosus: Secondary | ICD-10-CM | POA: Diagnosis not present

## 2020-02-05 DIAGNOSIS — R6881 Early satiety: Secondary | ICD-10-CM | POA: Diagnosis not present

## 2020-02-05 MED ORDER — TRAZODONE HCL 50 MG PO TABS: 25.0000 mg | ORAL_TABLET | Freq: Every evening | ORAL | 1 refills | Status: DC | PRN

## 2020-02-05 MED ORDER — EMGALITY 120 MG/ML ~~LOC~~ SOAJ: 1.0000 mL | SUBCUTANEOUS | 5 refills | Status: DC

## 2020-02-05 MED ORDER — MONTELUKAST SODIUM 10 MG PO TABS: 10.0000 mg | ORAL_TABLET | Freq: Every day | ORAL | 3 refills | Status: DC

## 2020-02-05 MED ORDER — LISDEXAMFETAMINE DIMESYLATE 50 MG PO CAPS: 50.0000 mg | ORAL_CAPSULE | Freq: Every day | ORAL | 0 refills | Status: DC

## 2020-02-05 MED ORDER — BETAMETHASONE DIPROPIONATE 0.05 % EX CREA: TOPICAL_CREAM | Freq: Two times a day (BID) | CUTANEOUS | 5 refills | Status: DC

## 2020-02-05 MED ORDER — TRAMADOL HCL 50 MG PO TABS: 100.0000 mg | ORAL_TABLET | Freq: Two times a day (BID) | ORAL | 0 refills | Status: DC

## 2020-02-05 MED ORDER — ATENOLOL 100 MG PO TABS: 100.0000 mg | ORAL_TABLET | Freq: Two times a day (BID) | ORAL | 3 refills | Status: DC

## 2020-02-05 MED ORDER — FLUOCINOLONE ACETONIDE SCALP 0.01 % EX OIL: TOPICAL_OIL | CUTANEOUS | 5 refills | Status: DC

## 2020-02-05 NOTE — Progress Notes (Signed)
Barbara Wood is a 50 y.o. female  Chief Complaint  Patient presents with  . Establish Care    Pt is not fasting for labs today, she is asking for TSH and Vit D.  Pt c/o nasusea everytime she drinks water x 6 months.  Pt is requesting something for sleep.  Pt also is requesting a mole to be looked at on rt side collar bone x 30yr.    HPI: Barbara Wood is a 50 y.o. female  1. She goes to bed 8-9pm and is exhausted. Symptoms x years/chronic. Wants to TSH and Vit D checked, CBC as well.  2. Mole on Rt collarbone area x 1 year. It has increased in size. It is "bothersome" if something touches it. No bleeding. + color change - strong fam h/o basal cell  3.Raynaud's acting during recent winter months. Pt cannot diltiazem d/t increase migraines and hole in teeth and hair falling out. 4. Pt complains of nausea with water intake if she drinks it on an empty stomach x 36mo. No issue with lemonade or other drinks. Less heartburn symptoms - takes prilosec 1 tab every 1-2 wks. No unintentional weight loss. + early satiety - not new, had CT abd, EGD (mild gastritis) & colonoscopy and given rifaximin for SIBO which helped for 6 mo or so. + bloated and abdominal distention. BRBPR with BM - not new or worse. She has h/o IBS.  5.  Migraine 3-4x/wk. Long (10+ year) h/o migraines w/ nausea. Tried multiple oral meds, botox but could not tolerate d/t side effects. Was on emgality x 4 mo and had significant improvement in migraine frequency and intensity. Would like to restart that.  6. Needs refills of singulair, betamethasone cream, derma-sooth oil for psoriasis.  7. Was on trazodone 25-50mg  qHS PRN for sleep in the past. Has been using melatonin which is helpful but not great. She goes to bed early but has difficulty sleep thru the night.   She is due for mammo, PAP. Will RTO for this    Past Medical History:  Diagnosis Date  . Allergy Age 32  . Asthma Since age 16  . Depression Age 89  . Heart murmur Mitral  valve prolapse age 64  . Thyroid disease Since age 61    Past Surgical History:  Procedure Laterality Date  . BREAST SURGERY  Reduction age 30  . CHOLECYSTECTOMY  Age 79    Social History   Socioeconomic History  . Marital status: Married    Spouse name: Not on file  . Number of children: Not on file  . Years of education: Not on file  . Highest education level: Not on file  Occupational History  . Not on file  Tobacco Use  . Smoking status: Never Smoker  . Smokeless tobacco: Never Used  Substance and Sexual Activity  . Alcohol use: Yes    Comment: 1-2 drinks per month  . Drug use: Never  . Sexual activity: Yes    Comment: Infertile  Other Topics Concern  . Not on file  Social History Narrative  . Not on file   Social Determinants of Health   Financial Resource Strain:   . Difficulty of Paying Living Expenses: Not on file  Food Insecurity:   . Worried About Charity fundraiser in the Last Year: Not on file  . Ran Out of Food in the Last Year: Not on file  Transportation Needs:   . Lack of Transportation (Medical): Not on file  .  Lack of Transportation (Non-Medical): Not on file  Physical Activity:   . Days of Exercise per Week: Not on file  . Minutes of Exercise per Session: Not on file  Stress:   . Feeling of Stress : Not on file  Social Connections:   . Frequency of Communication with Friends and Family: Not on file  . Frequency of Social Gatherings with Friends and Family: Not on file  . Attends Religious Services: Not on file  . Active Member of Clubs or Organizations: Not on file  . Attends Archivist Meetings: Not on file  . Marital Status: Not on file  Intimate Partner Violence:   . Fear of Current or Ex-Partner: Not on file  . Emotionally Abused: Not on file  . Physically Abused: Not on file  . Sexually Abused: Not on file    Family History  Problem Relation Age of Onset  . Asthma Sister   . Miscarriages / Stillbirths Sister   .  Cancer Mother   . Depression Mother      Immunization History  Administered Date(s) Administered  . Influenza,inj,Quad PF,6+ Mos 09/30/2019  . Influenza-Unspecified 11/02/2013    Outpatient Encounter Medications as of 02/05/2020  Medication Sig  . albuterol (PROVENTIL HFA) 108 (90 Base) MCG/ACT inhaler Inhale 2 puffs into the lungs every 4 (four) hours as needed for wheezing or shortness of breath.  . ARIPiprazole (ABILIFY) 5 MG tablet Take 1 tablet by mouth daily.  Marland Kitchen atenolol (TENORMIN) 100 MG tablet Take 100 mg by mouth 2 (two) times daily.  . benzonatate (TESSALON) 100 MG capsule Take 1 capsule (100 mg total) by mouth 3 (three) times daily as needed for cough.  . Budesonide (RHINOCORT ALLERGY NA) Use 1 spray in each nostril twice daily.  . budesonide-formoterol (SYMBICORT) 160-4.5 MCG/ACT inhaler   . cetirizine (ZYRTEC ALLERGY) 10 MG tablet Take 1 tablet by mouth 2 (two) times a day.  . diclofenac (VOLTAREN) 75 MG EC tablet   . DICLOFENAC PO Take 75 mg by mouth as needed.  . fluconazole (DIFLUCAN) 150 MG tablet TAKE 1 TABLET BY MOUTH EVERY 3 DAYS FOR 2 DOSES  . Fluocinolone Acetonide Scalp 0.01 % OIL   . HYDROcodone-acetaminophen (NORCO/VICODIN) 5-325 MG tablet Take by mouth.  Marland Kitchen HYDROcodone-homatropine (HYCODAN) 5-1.5 MG/5ML syrup Take 5 mLs by mouth every 6 (six) hours as needed for cough.  . lidocaine (LIDODERM) 5 %   . liothyronine (CYTOMEL) 5 MCG tablet TAKE 1 TABLET (5 MCG TOTAL) BY MOUTH 3 (THREE) TIMES DAILY.  Marland Kitchen lisdexamfetamine (VYVANSE) 50 MG capsule Take 1 capsule (50 mg total) by mouth daily.  . metaxalone (SKELAXIN) 800 MG tablet Take by mouth.  . montelukast (SINGULAIR) 10 MG tablet Take 1 tablet by mouth at bedtime.  . predniSONE (DELTASONE) 20 MG tablet 3 tabs po x 3 days, then 2 tabs po x 3 days, then 1 tab po x 3 days, then 1/2 tab po x 3 days  . promethazine (PHENERGAN) 25 MG tablet Take 1 tablet (25 mg total) by mouth every 6 (six) hours as needed for nausea or  vomiting.  . Rimegepant Sulfate ([name]) 75 MG TBDP Take 75 mg by mouth daily as needed.  Marland Kitchen SYNTHROID 125 MCG tablet Take 1 tablet (125 mcg total) by mouth daily before breakfast.  . traMADol (ULTRAM) 50 MG tablet Take 2 tablets (100 mg total) by mouth 2 (two) times daily.   No facility-administered encounter medications on file as of 02/05/2020.  ROS: Pertinent positives and negatives noted in HPI. Remainder of ROS non-contributory    Allergies  Allergen Reactions  . Amoxicillin-Pot Clavulanate Nausea And Vomiting  . Sertraline Other (See Comments)    hallucinations   . Sulfamethoxazole-Trimethoprim Hives  . Triptans Other (See Comments)    Chest pain     BP 124/80 (BP Location: Left Arm, Patient Position: Sitting, Cuff Size: Normal)   Pulse 82   Temp (!) 96.7 F (35.9 C) (Temporal)   Ht 5\' 7"  (1.702 m)   Wt 219 lb (99.3 kg)   SpO2 96%   BMI 34.30 kg/m   Physical Exam  Constitutional: She is oriented to person, place, and time. She appears well-developed and well-nourished. No distress.  Pulmonary/Chest: No respiratory distress.  Neurological: She is alert and oriented to person, place, and time.  Psychiatric: She has a normal mood and affect. Her behavior is normal.     A/P:  1. Atypical nevus of clavicle 2. Psoriasis - Ambulatory referral to Dermatology - refill derma-soothe oil  3. Early satiety 4. Abdominal bloating - Ambulatory referral to Gastroenterology - work up for early satiety 1+ year ago in [place] (EGD - mild gastritis as per pt, colo WNL, CT abd WNL, Rx'd rifaximin) - now significant nausea w/ water intake, bloating, distention, early satiery   5. Migraine without status migrainosus, not intractable, unspecified migraine type - x 10+ years - tried on PO meds as well as botox but unable to tolerate d/t side effects or not effective - has great improvement/relief with emgality in the past and would like to restart as she is currently having 3-4  migraines/wk Rx: - Galcanezumab-gnlm (EMGALITY) 120 MG/ML SOAJ; Inject 1 mL into the skin every 30 (thirty) days.  Dispense: 3 mL; Refill: 5  6. Fatigue, unspecified type - CBC - TSH - VITAMIN D 25 Hydroxy (Vit-D Deficiency, Fractures)    This visit occurred during the SARS-CoV-2 public health emergency.  Safety protocols were in place, including screening questions prior to the visit, additional usage of staff PPE, and extensive cleaning of exam room while observing appropriate contact time as indicated for disinfecting solutions.

## 2020-02-06 LAB — CBC
HCT: 37.1 % (ref 35.0–45.0)
Hemoglobin: 12.3 g/dL (ref 11.7–15.5)
MCH: 28.7 pg (ref 27.0–33.0)
MCHC: 33.2 g/dL (ref 32.0–36.0)
MCV: 86.7 fL (ref 80.0–100.0)
MPV: 10.2 fL (ref 7.5–12.5)
Platelets: 270 10*3/uL (ref 140–400)
RBC: 4.28 10*6/uL (ref 3.80–5.10)
RDW: 12.3 % (ref 11.0–15.0)
WBC: 9.4 10*3/uL (ref 3.8–10.8)

## 2020-02-06 LAB — TSH: TSH: 0.32 mIU/L — ABNORMAL LOW

## 2020-02-06 LAB — VITAMIN D 25 HYDROXY (VIT D DEFICIENCY, FRACTURES): Vit D, 25-Hydroxy: 15 ng/mL — ABNORMAL LOW (ref 30–100)

## 2020-02-11 ENCOUNTER — Encounter: Payer: Self-pay | Admitting: Family Medicine

## 2020-02-11 NOTE — Telephone Encounter (Signed)
Rivkah Dayrit Key: [id number] - PA Case [id number] - Rx #[id number] Need help? Call us at [phone] Outcome Approvedon February 5 Your PA request has been approved. Additional information will be provided in the approval communication. (Message 1145) Drug Emgality 120MG /ML auto-injectors (migraine) Form Merchandiser, retail PA Form Original Claim Info

## 2020-02-17 NOTE — Telephone Encounter (Signed)
[employer] sent letter of approval for medication, [name] ODT 75MG   effective for a maximum of 6 refills from 02/11/2020 to 08/09/2020.

## 2020-02-19 ENCOUNTER — Telehealth: Payer: Self-pay | Admitting: Family Medicine

## 2020-02-19 DIAGNOSIS — M7918 Myalgia, other site: Secondary | ICD-10-CM

## 2020-02-19 DIAGNOSIS — F988 Other specified behavioral and emotional disorders with onset usually occurring in childhood and adolescence: Secondary | ICD-10-CM

## 2020-02-19 MED ORDER — LISDEXAMFETAMINE DIMESYLATE 50 MG PO CAPS: 50.0000 mg | ORAL_CAPSULE | Freq: Every day | ORAL | 0 refills | Status: DC

## 2020-02-19 MED ORDER — TRAMADOL HCL 50 MG PO TABS: 100.0000 mg | ORAL_TABLET | Freq: Two times a day (BID) | ORAL | 0 refills | Status: DC

## 2020-02-19 MED FILL — VYVANSE 50 MG CAPSULE: 50 | 30 days supply | Qty: 30 | Fill #0

## 2020-02-19 NOTE — Telephone Encounter (Signed)
Pt states insurance is requiring her to switch all of her chronic/maintenance meds from [facility] to [facility]. Currently she just needs vyvanse and tramadol sent to [place].

## 2020-02-22 ENCOUNTER — Encounter: Payer: Self-pay | Admitting: Gastroenterology

## 2020-03-19 ENCOUNTER — Other Ambulatory Visit: Payer: Self-pay | Admitting: Family Medicine

## 2020-03-19 DIAGNOSIS — F988 Other specified behavioral and emotional disorders with onset usually occurring in childhood and adolescence: Secondary | ICD-10-CM

## 2020-03-19 DIAGNOSIS — M7918 Myalgia, other site: Secondary | ICD-10-CM

## 2020-03-21 NOTE — Telephone Encounter (Signed)
Refill request patients last visit 02/05/20 medications pending for your approval/denial.

## 2020-03-23 ENCOUNTER — Other Ambulatory Visit: Payer: Self-pay | Admitting: Family Medicine

## 2020-03-23 DIAGNOSIS — F988 Other specified behavioral and emotional disorders with onset usually occurring in childhood and adolescence: Secondary | ICD-10-CM

## 2020-03-23 DIAGNOSIS — M7918 Myalgia, other site: Secondary | ICD-10-CM

## 2020-03-23 NOTE — Telephone Encounter (Signed)
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 She will be taking the last dose of medications tomorrow. She said that she understands and respects Dr. Vivia Ewing schedule being Wednesday-Friday but asked if there is any coverage on her off days & vacation days to prevent pts from running out of medications.   Pt asking that medication be sent in the morning 03/24/2020 please.  [place], [place] - [address]. Phone:  [phone]  Fax:  [phone]

## 2020-03-24 MED ORDER — TRAMADOL HCL 50 MG PO TABS: 100.0000 mg | ORAL_TABLET | Freq: Two times a day (BID) | ORAL | 0 refills | Status: DC

## 2020-03-24 MED ORDER — VITAMIN D (ERGOCALCIFEROL) 1.25 MG (50000 UNIT) PO CAPS: 50000.0000 [IU] | ORAL_CAPSULE | ORAL | 0 refills | Status: DC

## 2020-03-24 MED ORDER — LISDEXAMFETAMINE DIMESYLATE 50 MG PO CAPS: 50.0000 mg | ORAL_CAPSULE | Freq: Every day | ORAL | 0 refills | Status: DC

## 2020-03-24 MED FILL — traMADol HCL 50 MG TABS: 50 | 30 days supply | Qty: 120 | Fill #0

## 2020-03-24 MED FILL — VYVANSE 50 MG CAPSULE: 50 | 30 days supply | Qty: 30 | Fill #0

## 2020-03-24 MED FILL — VIT D2 1.25 MG (50,000 UNIT: 1.25 MG | 84 days supply | Qty: 12 | Fill #0

## 2020-03-24 NOTE — Telephone Encounter (Signed)
Dr. Loletha Grayer please advise, pt stated she never got VIt D you were going ot send in to [facility] as mention below.   [place] for me to send in new rx? Please advise

## 2020-03-24 NOTE — Telephone Encounter (Signed)
Both meds refilled as requested. In an instance like this, request could be forwarded to "doc of the day" to handle in my absence so pt is not left without meds.

## 2020-03-24 NOTE — Addendum Note (Signed)
Addended byShawnie Pons on: 03/24/2020 03:22 PM   Modules accepted: Orders

## 2020-03-24 NOTE — Telephone Encounter (Signed)
Dr. Loletha Grayer please advise  Last refill was 02/19/2020 for both meds Last 3 PMP checked was  Tramadol:  [facility]   [facility]   [facility]  [facility]19/20[facility]   [facility]   [facility]

## 2020-03-27 ENCOUNTER — Other Ambulatory Visit: Payer: Self-pay | Admitting: Family Medicine

## 2020-03-28 MED FILL — traZODone HCL 50 MG TABS: 50 | 90 days supply | Qty: 90 | Fill #0

## 2020-03-28 MED FILL — MONTELUKAST SOD 10 MG TAB: 10 | 90 days supply | Qty: 90 | Fill #0

## 2020-03-28 MED FILL — SYNTHROID 125 MCG TABLET: 125 | 90 days supply | Qty: 90 | Fill #0

## 2020-03-28 NOTE — Telephone Encounter (Signed)
Last OV 02/05/20 Last fill 02/05/20 #90/1

## 2020-03-29 MED FILL — NURTEC 75 MG TBDP: 75 | 30 days supply | Qty: 8 | Fill #0

## 2020-03-29 NOTE — Telephone Encounter (Deleted)
Thank you :)

## 2020-04-06 ENCOUNTER — Encounter: Payer: Self-pay | Admitting: Gastroenterology

## 2020-04-06 ENCOUNTER — Ambulatory Visit: Payer: 59 | Admitting: Gastroenterology

## 2020-04-06 VITALS — BP 120/80 | HR 88 | Temp 98.5°F | Ht 67.0 in | Wt 220.0 lb

## 2020-04-06 DIAGNOSIS — Z9049 Acquired absence of other specified parts of digestive tract: Secondary | ICD-10-CM

## 2020-04-06 DIAGNOSIS — R110 Nausea: Secondary | ICD-10-CM

## 2020-04-06 DIAGNOSIS — K580 Irritable bowel syndrome with diarrhea: Secondary | ICD-10-CM

## 2020-04-06 DIAGNOSIS — R140 Abdominal distension (gaseous): Secondary | ICD-10-CM

## 2020-04-06 DIAGNOSIS — K219 Gastro-esophageal reflux disease without esophagitis: Secondary | ICD-10-CM

## 2020-04-06 DIAGNOSIS — K9089 Other intestinal malabsorption: Secondary | ICD-10-CM

## 2020-04-06 MED ORDER — COLESTIPOL HCL 1 G PO TABS: 1.0000 g | ORAL_TABLET | Freq: Two times a day (BID) | ORAL | 3 refills | Status: DC

## 2020-04-06 MED ORDER — OMEPRAZOLE 20 MG PO CPDR
20.0000 mg | DELAYED_RELEASE_CAPSULE | Freq: Every day | ORAL | 3 refills | Status: AC
Start: 2020-04-06 — End: ?

## 2020-04-06 MED FILL — OMEPRAZOLE DR 20 MG CAPSULE: 20 | 30 days supply | Qty: 30 | Fill #0

## 2020-04-06 MED FILL — COLESTIPOL HCL 1 GM TABLET: 1 | 30 days supply | Qty: 60 | Fill #0

## 2020-04-06 NOTE — Patient Instructions (Signed)
Please have your fasting lab work on or around 04/14/20. Go directly to the basement lab.   We have sent the following medications to your pharmacy for you to pick up at your convenience: omeprazole and colestid.   Avoid taking the colestid within 3-4 hours with any other meds to avoid drug interaction.   Due to recent changes in healthcare laws, you may see the results of your imaging and laboratory studies on MyChart before your provider has had a chance to review them.  We understand that in some cases there may be results that are confusing or concerning to you. Not all laboratory results come back in the same time frame and the provider may be waiting for multiple results in order to interpret others.  Please give [place] 48 hours in order for your provider to thoroughly review all the results before contacting the office for clarification of your results.

## 2020-04-06 NOTE — Progress Notes (Signed)
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  She recently moved here from [place] last year in August.  Change in bowel habits with increased bowel frequency, diarrhea 3-4 bowel movements per day associated with abdominal bloating in the past 3 to 4 years  S/p cholecystectomy at age 50  She underwent EGD and colonoscopy in [place] 2 years ago for abdominal bloating, showed gastritis and biopsies were negative for H.pylori No colon polyps and recall recommended in 10 years.  Reports are not available to review during this visit   She was treated with Xifaxin which improved her symptoms significantly but she started having recurrent abdominal bloating in the past few months but not as worse as before  Started having nausea in the morning when she tries to drink water on empty stomach since October 2020.  Nausea gets better when she eats.  No history of diabetes or hypoglycemia  Denies any  vomiting, abdominal pain, melena or bright red blood per rectum   She has Hashimoto's thyroiditis and is on thyroid replacement  She has family history of autoimmune disease and allergies Her siblings have history of eosinophilic esophagitis, ankylosing spondylitis  and celiac disease   Outpatient Encounter Medications as of 04/06/2020  Medication Sig  . albuterol (PROVENTIL HFA) 108 (90 Base) MCG/ACT inhaler Inhale 2 puffs into the lungs every 4 (four) hours as needed for wheezing or shortness of breath.  . ARIPiprazole (ABILIFY) 5 MG tablet Take 1 tablet by mouth daily.  Marland Kitchen atenolol (TENORMIN) 100 MG tablet Take 1 tablet (100 mg total) by mouth 2 (two) times daily.  . benzonatate (TESSALON) 100 MG capsule Take 1 capsule (100 mg total) by mouth 3 (three)  times daily as needed for cough.  . betamethasone dipropionate 0.05 % cream Apply topically 2 (two) times daily.  . Budesonide (RHINOCORT ALLERGY NA) Use 1 spray in each nostril twice daily.  . budesonide-formoterol (SYMBICORT) 160-4.5 MCG/ACT inhaler   . cetirizine (ZYRTEC ALLERGY) 10 MG tablet Take 1 tablet by mouth 2 (two) times a day.  . diclofenac (VOLTAREN) 75 MG EC tablet   . Fluocinolone Acetonide Scalp (DERMA-SMOOTHE/FS SCALP) 0.01 % OIL Apply TID  . Galcanezumab-gnlm (EMGALITY) 120 MG/ML SOAJ Inject 1 mL into the skin every 30 (thirty) days.  Marland Kitchen lidocaine (LIDODERM) 5 %   . liothyronine (CYTOMEL) 5 MCG tablet TAKE 1 TABLET (5 MCG TOTAL) BY MOUTH 3 (THREE) TIMES DAILY.  Marland Kitchen lisdexamfetamine (VYVANSE) 50 MG capsule Take 1 capsule (50 mg total) by mouth daily.  . metaxalone (SKELAXIN) 800 MG tablet Take by mouth.  . montelukast (SINGULAIR) 10 MG tablet TAKE 1 TABLET BY MOUTH EVERYDAY AT BEDTIME  . promethazine (PHENERGAN) 25 MG tablet Take 1 tablet (25 mg total) by mouth every 6 (six) hours as needed for nausea or vomiting.  . Rimegepant Sulfate ([name]) 75 MG TBDP Take 75 mg by mouth daily as needed.  Marland Kitchen SYNTHROID 125 MCG tablet Take 1 tablet (125 mcg total) by mouth daily before breakfast.  . traMADol (ULTRAM) 50 MG tablet Take 2 tablets (100 mg total) by mouth 2 (two) times daily.  . traZODone (DESYREL) 50 MG tablet TAKE 0.5-1 TABLETS (25-50 MG TOTAL) BY MOUTH  AT BEDTIME AS NEEDED FOR SLEEP.  . Vitamin D, Ergocalciferol, (DRISDOL) 1.25 MG (50000 UNIT) CAPS capsule Take 1 capsule (50,000 Units total) by mouth every 7 (seven) days. After 16 wks--otc vitamin D 2000-5000 IU daily.  Marland Kitchen HYDROcodone-acetaminophen (NORCO/VICODIN) 5-325 MG tablet Take by mouth.  . [DISCONTINUED] DICLOFENAC PO Take 75 mg by mouth as needed.  . [DISCONTINUED] fluconazole (DIFLUCAN) 150 MG tablet TAKE 1 TABLET BY MOUTH EVERY 3 DAYS FOR 2 DOSES  . [DISCONTINUED] Fluocinolone Acetonide Scalp 0.01 % OIL   .  [DISCONTINUED] HYDROcodone-homatropine (HYCODAN) 5-1.5 MG/5ML syrup Take 5 mLs by mouth every 6 (six) hours as needed for cough.  . [DISCONTINUED] predniSONE (DELTASONE) 20 MG tablet 3 tabs po x 3 days, then 2 tabs po x 3 days, then 1 tab po x 3 days, then 1/2 tab po x 3 days   No facility-administered encounter medications on file as of 04/06/2020.    Allergies as of 04/06/2020 - Review Complete 02/05/2020  Allergen Reaction Noted  . Amoxicillin-pot clavulanate Nausea And Vomiting 04/21/2013  . Sertraline Other (See Comments) 04/21/2013  . Sulfamethoxazole-trimethoprim Hives 07/26/2017  . Triptans Other (See Comments) 04/21/2013    Past Medical History:  Diagnosis Date  . Allergy Age 5  . Asthma Since age 50  . Depression Age 50  . Heart murmur Mitral valve prolapse age 50  . Thyroid disease Since age 50    Past Surgical History:  Procedure Laterality Date  . BREAST SURGERY  Reduction age 50  . CHOLECYSTECTOMY  Age 50    Family History  Problem Relation Age of Onset  . Asthma Sister   . Miscarriages / Stillbirths Sister   . Cancer Mother   . Depression Mother     Social History   Socioeconomic History  . Marital status: Married    Spouse name: Not on file  . Number of children: Not on file  . Years of education: Not on file  . Highest education level: Not on file  Occupational History  . Not on file  Tobacco Use  . Smoking status: Never Smoker  . Smokeless tobacco: Never Used  Substance and Sexual Activity  . Alcohol use: Yes    Comment: 1-2 drinks per month  . Drug use: Never  . Sexual activity: Yes    Comment: Infertile  Other Topics Concern  . Not on file  Social History Narrative  . Not on file   Social Determinants of Health   Financial Resource Strain:   . Difficulty of Paying Living Expenses:   Food Insecurity:   . Worried About Charity fundraiser in the Last Year:   . Arboriculturist in the Last Year:   Transportation Needs:   . Lexicographer (Medical):   Marland Kitchen Lack of Transportation (Non-Medical):   Physical Activity:   . Days of Exercise per Week:   . Minutes of Exercise per Session:   Stress:   . Feeling of Stress :   Social Connections:   . Frequency of Communication with Friends and Family:   . Frequency of Social Gatherings with Friends and Family:   . Attends Religious Services:   . Active Member of Clubs or Organizations:   . Attends Archivist Meetings:   Marland Kitchen Marital Status:   Intimate Partner Violence:   . Fear of Current or Ex-Partner:   . Emotionally Abused:   Marland Kitchen Physically Abused:   . Sexually Abused:  Review of systems: Positive for allergy/sinus trouble, neck pain with arthritis, depression stable, fatigue and chronic headaches All other review of systems negative except as mentioned in the HPI.   Physical Exam: Vitals:   04/06/20 1408  BP: 120/80  Pulse: 88  Temp: 98.5 F (36.9 C)   Body mass index is 34.46 kg/m. Gen:      No acute distress Neuro: alert and oriented x 3 Psych: normal mood and affect  Data Reviewed:  Reviewed labs, radiology imaging, old records and pertinent past GI work up   Assessment and Plan/Recommendations:  51 year old female with history of Hashimoto thyroiditis on levothyroxine, s/p cholecystectomy here with complaints of nausea, abdominal bloating and increased bowel frequency  Nausea: Unclear etiology Uncontrolled GERD or silent gastroesophageal reflux possible trigger We will do trial of omeprazole 20 mg daily for 8 weeks, advised patient to take in the morning before breakfast Antireflux measures Also check fasting blood glucose, BMP and a.m. cortisol level to exclude adrenal insufficiency  Abdominal bloating: Family history of celiac disease, check TTG IgA antibody and IgA level Possible small intestinal bacterial overgrowth/IBS, given symptoms are stable will hold off repeat course of Xifaxan  Increased bowel  frequency/diarrhea: Possible bile salt induced diarrhea versus irritable bowel syndrome predominant diarrhea Start Colestid 1 g twice daily Avoid taking Colestid within 3 to 4 hours of other meds to avoid drug interaction  Will obtain records of EGD and colonoscopy from previous gastroenterology office in [place]  Return in 2 months   The patient was provided an opportunity to ask questions and all were answered. The patient agreed with the plan and demonstrated an understanding of the instructions.  Damaris Hippo , MD    CC: Ronnald Nian, DO

## 2020-04-12 ENCOUNTER — Encounter: Payer: Self-pay | Admitting: Nurse Practitioner

## 2020-04-12 ENCOUNTER — Other Ambulatory Visit: Payer: Self-pay

## 2020-04-12 ENCOUNTER — Telehealth (INDEPENDENT_AMBULATORY_CARE_PROVIDER_SITE_OTHER): Payer: 59 | Admitting: Nurse Practitioner

## 2020-04-12 VITALS — Ht 67.0 in | Wt 215.0 lb

## 2020-04-12 DIAGNOSIS — N3000 Acute cystitis without hematuria: Secondary | ICD-10-CM

## 2020-04-12 MED ORDER — FLUCONAZOLE 150 MG PO TABS: 150.0000 mg | ORAL_TABLET | Freq: Every day | ORAL | 0 refills | Status: DC

## 2020-04-12 MED ORDER — CEPHALEXIN 500 MG PO CAPS: 500.0000 mg | ORAL_CAPSULE | Freq: Three times a day (TID) | ORAL | 0 refills | Status: DC

## 2020-04-12 MED FILL — FLUCONAZOLE 150 MG TABLET: 150 | 4 days supply | Qty: 2 | Fill #0

## 2020-04-12 MED FILL — CEPHALEXIN 500 MG CAPSULE: 500 | 5 days supply | Qty: 15 | Fill #0

## 2020-04-12 NOTE — Progress Notes (Signed)
Virtual Visit via Video Note  I connected with@ on 04/12/20 at 10:00 AM EDT by a video enabled telemedicine application and verified that I am speaking with the correct person using two identifiers.  Location: Patient:Home Provider: Office Participants: patient and provider  I discussed the limitations of evaluation and management by telemedicine and the availability of in person appointments. I also discussed with the patient that there may be a patient responsible charge related to this service. The patient expressed understanding and agreed to proceed.  CC:Pt states over last 24 hours frequent urination she said 15X so far//pt reports no pain or blood in urine//pt at work so no means to get vitals.  History of Present Illness: Urinary Tract Infection  This is a new problem. The current episode started yesterday. The problem occurs every urination. The problem has been unchanged. The patient is experiencing no pain. There has been no fever. She is sexually active. There is no history of pyelonephritis. Associated symptoms include frequency and urgency. Pertinent negatives include no chills, discharge, flank pain, hematuria, hesitancy, nausea, possible pregnancy, sweats or vomiting. She has tried nothing for the symptoms. There is no history of catheterization, kidney stones, recurrent UTIs, a single kidney, urinary stasis or a urological procedure.  LMP 4months ago, infertile per medical history. Denies any vaginal symptoms No recent oral abx used  Reviewed medication list, medical and surgical hx.  Observations/Objective: Physical Exam  Constitutional: She is oriented to person, place, and time. No distress.  Pulmonary/Chest: Effort normal.  Neurological: She is alert and oriented to person, place, and time.  limited exam due to video appt  Assessment and Plan: Barbara Wood was seen today for urinary tract infection.  Diagnoses and all orders for this visit:  Acute cystitis without  hematuria -     cephALEXin (KEFLEX) 500 MG capsule; Take 1 capsule (500 mg total) by mouth 3 (three) times daily. -     fluconazole (DIFLUCAN) 150 MG tablet; Take 1 tablet (150 mg total) by mouth daily. Take second tab 3days apart from first tab   Follow Up Instructions: See avs   I discussed the assessment and treatment plan with the patient. The patient was provided an opportunity to ask questions and all were answered. The patient agreed with the plan and demonstrated an understanding of the instructions.   The patient was advised to call back or seek an in-person evaluation if the symptoms worsen or if the condition fails to improve as anticipated.  Wilfred Lacy, NP

## 2020-04-18 ENCOUNTER — Other Ambulatory Visit: Payer: Self-pay | Admitting: Family Medicine

## 2020-04-18 DIAGNOSIS — F988 Other specified behavioral and emotional disorders with onset usually occurring in childhood and adolescence: Secondary | ICD-10-CM

## 2020-04-18 DIAGNOSIS — M7918 Myalgia, other site: Secondary | ICD-10-CM

## 2020-04-19 MED ORDER — LISDEXAMFETAMINE DIMESYLATE 50 MG PO CAPS: 50.0000 mg | ORAL_CAPSULE | Freq: Every day | ORAL | 0 refills | Status: DC

## 2020-04-19 MED ORDER — TRAMADOL HCL 50 MG PO TABS: 100.0000 mg | ORAL_TABLET | Freq: Two times a day (BID) | ORAL | 0 refills | Status: DC

## 2020-04-19 NOTE — Telephone Encounter (Signed)
Last OV 02/05/20 Last fill for Tramadol 03/24/20 #120/0 Last fill for Vyvanse 03/24/20 #30/0

## 2020-04-20 ENCOUNTER — Other Ambulatory Visit: Payer: Self-pay | Admitting: Family Medicine

## 2020-04-20 DIAGNOSIS — G43909 Migraine, unspecified, not intractable, without status migrainosus: Secondary | ICD-10-CM

## 2020-04-20 MED ORDER — PROMETHAZINE HCL 25 MG PO TABS: 25.0000 mg | ORAL_TABLET | Freq: Four times a day (QID) | ORAL | 3 refills | Status: DC | PRN

## 2020-04-20 MED ORDER — ARIPIPRAZOLE 5 MG PO TABS: 10.0000 mg | ORAL_TABLET | Freq: Every day | ORAL | 3 refills | Status: DC

## 2020-04-20 MED ORDER — HYDROCODONE-ACETAMINOPHEN 5-325 MG PO TABS: 1.0000 | ORAL_TABLET | Freq: Four times a day (QID) | ORAL | 0 refills | Status: DC | PRN

## 2020-04-20 MED FILL — HYDROCODON-APAP 5-325: 5-325 | 7 days supply | Qty: 30 | Fill #0

## 2020-04-20 MED FILL — PROMETHAZINE 25 MG TABLET: 25 | 7 days supply | Qty: 30 | Fill #0

## 2020-04-21 ENCOUNTER — Other Ambulatory Visit (INDEPENDENT_AMBULATORY_CARE_PROVIDER_SITE_OTHER): Payer: 59

## 2020-04-21 DIAGNOSIS — K580 Irritable bowel syndrome with diarrhea: Secondary | ICD-10-CM | POA: Diagnosis not present

## 2020-04-21 DIAGNOSIS — K9089 Other intestinal malabsorption: Secondary | ICD-10-CM

## 2020-04-21 DIAGNOSIS — R110 Nausea: Secondary | ICD-10-CM

## 2020-04-21 DIAGNOSIS — R140 Abdominal distension (gaseous): Secondary | ICD-10-CM

## 2020-04-21 LAB — BASIC METABOLIC PANEL
BUN: 10 mg/dL (ref 6–23)
CO2: 30 mEq/L (ref 19–32)
Calcium: 9.2 mg/dL (ref 8.4–10.5)
Chloride: 103 mEq/L (ref 96–112)
Creatinine, Ser: 0.7 mg/dL (ref 0.40–1.20)
GFR: 89.56 mL/min (ref >60.00)
Glucose, Bld: 101 mg/dL — ABNORMAL HIGH (ref 70–99)
Potassium: 3.9 mEq/L (ref 3.5–5.1)
Sodium: 141 mEq/L (ref 135–145)

## 2020-04-21 LAB — CORTISOL: Cortisol, Plasma: 10.9 ug/dL

## 2020-04-21 LAB — IGA: IgA: 209 mg/dL (ref 68–378)

## 2020-04-21 MED FILL — traMADol HCL 50 MG TABS: 50 | 30 days supply | Qty: 120 | Fill #0

## 2020-04-21 MED FILL — VYVANSE 50 MG CAPSULE: 50 | 30 days supply | Qty: 30 | Fill #0

## 2020-04-22 LAB — TISSUE TRANSGLUTAMINASE, IGA: (tTG) Ab, IgA: 1 U/mL

## 2020-04-26 ENCOUNTER — Telehealth: Payer: Self-pay | Admitting: Family Medicine

## 2020-04-26 NOTE — Telephone Encounter (Signed)
Barbara Wood is calling and wanted to speak to someone regarding a aripiprazole that was sent for pharmacy. CB is [phone]

## 2020-04-26 NOTE — Telephone Encounter (Signed)
I spoke with Barbara Wood from, [place] and he informed me that pt's insurance will only pay for pt to take 1, 10mg  tablet daily, verses pt taking 2, 5mg  tablets daily.  I called pt to make her aware of change.

## 2020-04-27 ENCOUNTER — Other Ambulatory Visit: Payer: Self-pay | Admitting: Family Medicine

## 2020-04-27 DIAGNOSIS — L910 Hypertrophic scar: Secondary | ICD-10-CM

## 2020-04-27 MED FILL — ARIPIPRAZOLE 10 MG TABS: 10 | 90 days supply | Qty: 90 | Fill #0

## 2020-05-17 ENCOUNTER — Other Ambulatory Visit: Payer: Self-pay | Admitting: Family Medicine

## 2020-05-17 DIAGNOSIS — M7918 Myalgia, other site: Secondary | ICD-10-CM

## 2020-05-17 DIAGNOSIS — F988 Other specified behavioral and emotional disorders with onset usually occurring in childhood and adolescence: Secondary | ICD-10-CM

## 2020-05-19 ENCOUNTER — Other Ambulatory Visit: Payer: Self-pay | Admitting: Family Medicine

## 2020-05-19 DIAGNOSIS — M7918 Myalgia, other site: Secondary | ICD-10-CM

## 2020-05-19 DIAGNOSIS — F988 Other specified behavioral and emotional disorders with onset usually occurring in childhood and adolescence: Secondary | ICD-10-CM

## 2020-05-19 MED ORDER — LISDEXAMFETAMINE DIMESYLATE 50 MG PO CAPS: 50.0000 mg | ORAL_CAPSULE | Freq: Every day | ORAL | 0 refills | Status: DC

## 2020-05-19 MED ORDER — NURTEC 75 MG PO TBDP: 75.0000 mg | ORAL_TABLET | Freq: Every day | ORAL | 3 refills | Status: DC | PRN

## 2020-05-19 MED ORDER — TRAMADOL HCL 50 MG PO TABS: 100.0000 mg | ORAL_TABLET | Freq: Two times a day (BID) | ORAL | 0 refills | Status: DC

## 2020-05-19 MED FILL — VYVANSE 50 MG CAPSULE: 50 | 30 days supply | Qty: 30 | Fill #0

## 2020-05-19 MED FILL — traMADol HCL 50 MG TABS: 50 | 30 days supply | Qty: 120 | Fill #0

## 2020-05-19 MED FILL — NURTEC 75 MG TBDP: 75 | 30 days supply | Qty: 8 | Fill #0

## 2020-05-23 ENCOUNTER — Ambulatory Visit: Payer: 59 | Admitting: Gastroenterology

## 2020-06-08 ENCOUNTER — Telehealth: Payer: Self-pay | Admitting: Family Medicine

## 2020-06-08 ENCOUNTER — Other Ambulatory Visit: Payer: Self-pay

## 2020-06-08 MED ORDER — ATENOLOL 100 MG PO TABS: 100.0000 mg | ORAL_TABLET | Freq: Two times a day (BID) | ORAL | 3 refills | Status: DC

## 2020-06-08 NOTE — Telephone Encounter (Signed)
Barbara Wood is calling and requesting a new prescription for atenolol be sent to [facility]. CB is [phone]

## 2020-06-08 NOTE — Telephone Encounter (Signed)
Rx filled and sent pharmacy.

## 2020-06-08 NOTE — Telephone Encounter (Signed)
Last VV 04/12/20 Last fill 02/15/20  #180/3

## 2020-06-20 ENCOUNTER — Telehealth: Payer: Self-pay | Admitting: Family Medicine

## 2020-06-20 DIAGNOSIS — F988 Other specified behavioral and emotional disorders with onset usually occurring in childhood and adolescence: Secondary | ICD-10-CM

## 2020-06-20 DIAGNOSIS — M7918 Myalgia, other site: Secondary | ICD-10-CM

## 2020-06-20 MED ORDER — LISDEXAMFETAMINE DIMESYLATE 50 MG PO CAPS: 50.0000 mg | ORAL_CAPSULE | Freq: Every day | ORAL | 0 refills | Status: DC

## 2020-06-20 MED ORDER — TRAMADOL HCL 50 MG PO TABS: 100.0000 mg | ORAL_TABLET | Freq: Two times a day (BID) | ORAL | 0 refills | Status: DC

## 2020-06-20 MED FILL — traMADol HCL 50 MG TABS: 50 | 8 days supply | Qty: 30 | Fill #0

## 2020-06-20 MED FILL — VYVANSE 50 MG CAPSULE: 50 | 30 days supply | Qty: 30 | Fill #0

## 2020-06-20 NOTE — Telephone Encounter (Signed)
Patient states she is out of Tramadol and Vyvanse and needs refills today. Cannot wait until Dr. Bryan Lemma comes back on Wednesday.

## 2020-06-20 NOTE — Telephone Encounter (Signed)
Please see message and advise.  Thank you. Last VV 04/16/20 Last fill for Tram 05/19/20  #120/0 Last fill for Vy 05/19/20 #30/0

## 2020-06-20 NOTE — Telephone Encounter (Signed)
Pt informed

## 2020-06-20 NOTE — Telephone Encounter (Signed)
Sent 30tabs for each medication. Need to discuss additional refill and need for next OV with pcp.

## 2020-06-20 NOTE — Telephone Encounter (Signed)
Patient states she needs 75 more tramadol pills to get her to her next appt which is scheduled on July 15th. She has already picked up the 30 pill refill sent today.

## 2020-06-21 NOTE — Telephone Encounter (Signed)
Dr C please advise.  Pt is asking for 75 more tramadol pills. [facility] sent in 14 yesterday 06/20/20. Shes asking for enough to last her till her appointment with you on 07/14/20.

## 2020-06-22 MED ORDER — TRAMADOL HCL 50 MG PO TABS: 100.0000 mg | ORAL_TABLET | Freq: Two times a day (BID) | ORAL | 0 refills | Status: DC

## 2020-06-22 NOTE — Telephone Encounter (Signed)
Rx sent 

## 2020-06-22 NOTE — Telephone Encounter (Signed)
Pt was notified an verbally understood. 

## 2020-06-22 NOTE — Addendum Note (Signed)
Addended by: Ronnald Nian on: 06/22/2020 07:39 AM   Modules accepted: Orders

## 2020-06-24 ENCOUNTER — Telehealth: Payer: Self-pay | Admitting: Family Medicine

## 2020-06-24 MED FILL — traMADol HCL 50 MG TABS: 50 | 18 days supply | Qty: 75 | Fill #0

## 2020-06-24 NOTE — Telephone Encounter (Signed)
I sent 75 tab of tramdaol on 06/22/20 and [facility] sent 30 tabs on 6/21 while I was out of the office. I'm unclear as to what the issue/confusion is

## 2020-06-24 NOTE — Telephone Encounter (Signed)
I spoke with Erin from pharmacy and she just wanted to inform Dr. Loletha Grayer that they had fill the pt's rx 3 days early.  Dr. Loletha Grayer is aware.

## 2020-06-24 NOTE — Telephone Encounter (Signed)
Notified patient of lab results.  Patient verbalized understanding.  

## 2020-06-24 NOTE — Telephone Encounter (Signed)
Barbara Wood with [facility] called to say the tramadol prescription was mistakenly sent in for just 7 days supply. She wanted to let you know they are filling it today which is three days early. Please call pharmacy to clarify prescription. [phone].

## 2020-06-28 MED FILL — NURTEC 75 MG TBDP: 75 | 30 days supply | Qty: 8 | Fill #1

## 2020-06-29 MED FILL — MONTELUKAST SOD 10 MG TAB: 10 | 90 days supply | Qty: 90 | Fill #1

## 2020-06-29 MED FILL — SYNTHROID 125 MCG TABLET: 125 | 90 days supply | Qty: 90 | Fill #1

## 2020-07-13 ENCOUNTER — Other Ambulatory Visit: Payer: Self-pay

## 2020-07-14 ENCOUNTER — Encounter: Payer: Self-pay | Admitting: Family Medicine

## 2020-07-14 ENCOUNTER — Ambulatory Visit: Payer: 59 | Admitting: Family Medicine

## 2020-07-14 ENCOUNTER — Ambulatory Visit (INDEPENDENT_AMBULATORY_CARE_PROVIDER_SITE_OTHER): Payer: 59

## 2020-07-14 ENCOUNTER — Other Ambulatory Visit: Payer: Self-pay | Admitting: Family Medicine

## 2020-07-14 VITALS — BP 118/80 | HR 74 | Temp 96.8°F | Ht 67.0 in | Wt 213.8 lb

## 2020-07-14 DIAGNOSIS — F988 Other specified behavioral and emotional disorders with onset usually occurring in childhood and adolescence: Secondary | ICD-10-CM

## 2020-07-14 DIAGNOSIS — M79643 Pain in unspecified hand: Secondary | ICD-10-CM

## 2020-07-14 DIAGNOSIS — D2260 Melanocytic nevi of unspecified upper limb, including shoulder: Secondary | ICD-10-CM

## 2020-07-14 DIAGNOSIS — Z832 Family history of diseases of the blood and blood-forming organs and certain disorders involving the immune mechanism: Secondary | ICD-10-CM

## 2020-07-14 DIAGNOSIS — M7918 Myalgia, other site: Secondary | ICD-10-CM | POA: Diagnosis not present

## 2020-07-14 DIAGNOSIS — M7989 Other specified soft tissue disorders: Secondary | ICD-10-CM

## 2020-07-14 DIAGNOSIS — B0229 Other postherpetic nervous system involvement: Secondary | ICD-10-CM | POA: Diagnosis not present

## 2020-07-14 DIAGNOSIS — M79641 Pain in right hand: Secondary | ICD-10-CM | POA: Diagnosis not present

## 2020-07-14 DIAGNOSIS — L409 Psoriasis, unspecified: Secondary | ICD-10-CM | POA: Diagnosis not present

## 2020-07-14 DIAGNOSIS — G43909 Migraine, unspecified, not intractable, without status migrainosus: Secondary | ICD-10-CM | POA: Diagnosis not present

## 2020-07-14 DIAGNOSIS — M79642 Pain in left hand: Secondary | ICD-10-CM | POA: Diagnosis not present

## 2020-07-14 LAB — C-REACTIVE PROTEIN: CRP: 1.3 mg/dL (ref 0.5–20.0)

## 2020-07-14 LAB — SEDIMENTATION RATE: Sed Rate: 20 mm/hr (ref 0–20)

## 2020-07-14 MED ORDER — LISDEXAMFETAMINE DIMESYLATE 50 MG PO CAPS: 50.0000 mg | ORAL_CAPSULE | Freq: Every day | ORAL | 0 refills | Status: DC

## 2020-07-14 MED ORDER — LIDOCAINE 5 % EX PTCH: 1.0000 | MEDICATED_PATCH | CUTANEOUS | 2 refills | Status: DC

## 2020-07-14 MED ORDER — TRAMADOL HCL 50 MG PO TABS: 100.0000 mg | ORAL_TABLET | Freq: Two times a day (BID) | ORAL | 0 refills | Status: DC

## 2020-07-14 MED ORDER — EMGALITY 120 MG/ML ~~LOC~~ SOAJ: 1.0000 mL | SUBCUTANEOUS | 3 refills | Status: DC

## 2020-07-14 MED ORDER — PROMETHAZINE HCL 25 MG PO TABS: 25.0000 mg | ORAL_TABLET | Freq: Four times a day (QID) | ORAL | 3 refills | Status: DC | PRN

## 2020-07-14 MED FILL — traMADol HCL 50 MG TABS: 50 | 30 days supply | Qty: 120 | Fill #0

## 2020-07-14 MED FILL — LIDOCAINE PATCH 5%: 5 | 30 days supply | Qty: 30 | Fill #0

## 2020-07-14 MED FILL — PROMETHAZINE 25 MG TABLET: 25 | 7 days supply | Qty: 30 | Fill #0

## 2020-07-14 NOTE — Progress Notes (Signed)
Barbara Wood is a 50 y.o. female  Chief Complaint  Patient presents with  . Follow-up    Medication refill.  Pt c/o moles, pt's mole on her lt collar bone has started bleeding x 3weeks ago, Pt has one on her bottom that has gotten bigger in the past yr.  Pt need another referral to Dermatology.      HPI: Barbara Wood is a 50 y.o. female  1. Derm referral needed for nevus on Lt clavicle x 1.5 years. Recently had episode of spontaneous bleeding. Also has a nevus on lower back just above buttock x years. Pt has a h/o psoriasis.  2. She has appt with Dr. Marla Roe (plastic surgery) on 07/19/20 - hypertrophic scars from breast reduction surgery 3. She needs refills - lidocaine patches, vyvanse, tramadol, emgality. Pt states [facility] told her 90 day supplies of controlled meds can now be sent 4. She is now having intermittent hand swelling, stiffness, pain. She also notes fam h/o multiple autoimmune conditions. She has psoriasis and wonders about psoriatic arthritis.    Past Medical History:  Diagnosis Date  . Allergy Age 49  . Asthma Since age 53  . Depression Age 66  . Heart murmur Mitral valve prolapse age 67  . Thyroid disease Since age 21    Past Surgical History:  Procedure Laterality Date  . BREAST SURGERY  Reduction age 43  . CHOLECYSTECTOMY  Age 44    Social History   Socioeconomic History  . Marital status: Married    Spouse name: Not on file  . Number of children: Not on file  . Years of education: Not on file  . Highest education level: Not on file  Occupational History  . Not on file  Tobacco Use  . Smoking status: Never Smoker  . Smokeless tobacco: Never Used  Substance and Sexual Activity  . Alcohol use: Yes    Comment: 1-2 drinks per month  . Drug use: Never  . Sexual activity: Yes    Comment: Infertile  Other Topics Concern  . Not on file  Social History Narrative  . Not on file   Social Determinants of Health   Financial Resource Strain:   .  Difficulty of Paying Living Expenses:   Food Insecurity:   . Worried About Charity fundraiser in the Last Year:   . Arboriculturist in the Last Year:   Transportation Needs:   . Film/video editor (Medical):   Marland Kitchen Lack of Transportation (Non-Medical):   Physical Activity:   . Days of Exercise per Week:   . Minutes of Exercise per Session:   Stress:   . Feeling of Stress :   Social Connections:   . Frequency of Communication with Friends and Family:   . Frequency of Social Gatherings with Friends and Family:   . Attends Religious Services:   . Active Member of Clubs or Organizations:   . Attends Archivist Meetings:   Marland Kitchen Marital Status:   Intimate Partner Violence:   . Fear of Current or Ex-Partner:   . Emotionally Abused:   Marland Kitchen Physically Abused:   . Sexually Abused:     Family History  Problem Relation Age of Onset  . Asthma Sister   . Miscarriages / Stillbirths Sister   . Cancer Mother   . Depression Mother      Immunization History  Administered Date(s) Administered  . Influenza,inj,Quad PF,6+ Mos 09/30/2019  . Influenza-Unspecified 11/02/2013  Outpatient Encounter Medications as of 07/14/2020  Medication Sig  . albuterol (PROVENTIL HFA) 108 (90 Base) MCG/ACT inhaler Inhale 2 puffs into the lungs every 4 (four) hours as needed for wheezing or shortness of breath.  . ARIPiprazole (ABILIFY) 10 MG tablet Take 10 mg by mouth daily.  . ARIPiprazole (ABILIFY) 5 MG tablet Take 2 tablets (10 mg total) by mouth daily.  Marland Kitchen atenolol (TENORMIN) 100 MG tablet Take 1 tablet (100 mg total) by mouth 2 (two) times daily.  . benzonatate (TESSALON) 100 MG capsule Take 1 capsule (100 mg total) by mouth 3 (three) times daily as needed for cough.  . betamethasone dipropionate 0.05 % cream Apply topically 2 (two) times daily.  . Budesonide (RHINOCORT ALLERGY NA) Use 1 spray in each nostril twice daily.  . budesonide-formoterol (SYMBICORT) 160-4.5 MCG/ACT inhaler   . cetirizine  (ZYRTEC ALLERGY) 10 MG tablet Take 1 tablet by mouth 2 (two) times a day.  . diclofenac (VOLTAREN) 75 MG EC tablet   . Fluocinolone Acetonide Scalp (DERMA-SMOOTHE/FS SCALP) 0.01 % OIL Apply TID  . Galcanezumab-gnlm (EMGALITY) 120 MG/ML SOAJ Inject 1 mL into the skin every 30 (thirty) days.  Marland Kitchen lidocaine (LIDODERM) 5 %   . liothyronine (CYTOMEL) 5 MCG tablet TAKE 1 TABLET (5 MCG TOTAL) BY MOUTH 3 (THREE) TIMES DAILY.  Marland Kitchen lisdexamfetamine (VYVANSE) 50 MG capsule Take 1 capsule (50 mg total) by mouth daily.  . metaxalone (SKELAXIN) 800 MG tablet Take by mouth.  . montelukast (SINGULAIR) 10 MG tablet TAKE 1 TABLET BY MOUTH EVERYDAY AT BEDTIME  . omeprazole (PRILOSEC) 20 MG capsule Take 1 capsule (20 mg total) by mouth daily.  . promethazine (PHENERGAN) 25 MG tablet Take 1 tablet (25 mg total) by mouth every 6 (six) hours as needed for nausea or vomiting.  . Rimegepant Sulfate ([name]) 75 MG TBDP Take 75 mg by mouth daily as needed.  Marland Kitchen SYNTHROID 125 MCG tablet Take 1 tablet (125 mcg total) by mouth daily before breakfast.  . traMADol (ULTRAM) 50 MG tablet Take 2 tablets (100 mg total) by mouth 2 (two) times daily.  . traZODone (DESYREL) 50 MG tablet TAKE 0.5-1 TABLETS (25-50 MG TOTAL) BY MOUTH AT BEDTIME AS NEEDED FOR SLEEP.  . cephALEXin (KEFLEX) 500 MG capsule Take 1 capsule (500 mg total) by mouth 3 (three) times daily.  . colestipol (COLESTID) 1 g tablet Take 1 tablet (1 g total) by mouth 2 (two) times daily. (Patient not taking: Reported on 07/14/2020)  . fluconazole (DIFLUCAN) 150 MG tablet Take 1 tablet (150 mg total) by mouth daily. Take second tab 3days apart from first tab (Patient not taking: Reported on 07/14/2020)  . Vitamin D, Ergocalciferol, (DRISDOL) 1.25 MG (50000 UNIT) CAPS capsule Take 1 capsule (50,000 Units total) by mouth every 7 (seven) days. After 16 wks--otc vitamin D 2000-5000 IU daily. (Patient not taking: Reported on 07/14/2020)   No facility-administered encounter medications  on file as of 07/14/2020.     ROS: Pertinent positives and negatives noted in HPI. Remainder of ROS non-contributory    Allergies  Allergen Reactions  . Amoxicillin-Pot Clavulanate Nausea And Vomiting  . Sertraline Other (See Comments)    hallucinations   . Sulfamethoxazole-Trimethoprim Hives  . Triptans Other (See Comments)    Chest pain     BP 118/80 (BP Location: Left Arm, Patient Position: Sitting, Cuff Size: Normal)   Pulse 74   Temp (!) 96.8 F (36 C) (Temporal)   Ht 5\' 7"  (1.702 m)   Wt  213 lb 12.8 oz (97 kg)   LMP 10/31/2019   SpO2 99%   BMI 33.49 kg/m   Physical Exam   A/P:  1. Myofascial pain - at baseline - database reviewed and appropriate Refill: - traMADol (ULTRAM) 50 MG tablet; Take 2 tablets (100 mg total) by mouth 2 (two) times daily.  Dispense: 360 tablet; Refill: 0 - f/u in 3 mo  2. Attention deficit disorder, unspecified hyperactivity presence - stable, controlled - database reviewed and appropriate Refill: - lisdexamfetamine (VYVANSE) 50 MG capsule; Take 1 capsule (50 mg total) by mouth daily.  Dispense: 90 capsule; Refill: 0 - f/u in 3 mo  3. Migraine without status migrainosus, not intractable, unspecified migraine type - stable and improved on emgality Refill: - Galcanezumab-gnlm (EMGALITY) 120 MG/ML SOAJ; Inject 1 mL into the skin every 30 (thirty) days.  Dispense: 3 mL; Refill: 3 - promethazine (PHENERGAN) 25 MG tablet; Take 1 tablet (25 mg total) by mouth every 6 (six) hours as needed for nausea or vomiting.  Dispense: 30 tablet; Refill: 3  4. Atypical nevus of clavicle - Ambulatory referral to Dermatology  5. Psoriasis 6. Intermittent pain and swelling of hand 7. Family history of autoimmune disorder - ANA - C-reactive protein - Sedimentation rate - Rheumatoid factor - Cyclic citrul peptide antibody, IgG (QUEST) - DG Hand Complete Right - DG Hand Complete Left - lidocaine (LIDODERM) 5 %; Place 1 patch onto the skin daily.  Remove & Discard patch within 12 hours or as directed by MD  Dispense: 30 patch; Refill: 2 - Ambulatory referral to Rheumatology  8. Postherpetic neuralgia Refill: - lidocaine (LIDODERM) 5 %; Place 1 patch onto the skin daily. Remove & Discard patch within 12 hours or as directed by MD  Dispense: 30 patch; Refill: 2   This visit occurred during the SARS-CoV-2 public health emergency.  Safety protocols were in place, including screening questions prior to the visit, additional usage of staff PPE, and extensive cleaning of exam room while observing appropriate contact time as indicated for disinfecting solutions.

## 2020-07-15 ENCOUNTER — Encounter: Payer: Self-pay | Admitting: Family Medicine

## 2020-07-15 DIAGNOSIS — H52223 Regular astigmatism, bilateral: Secondary | ICD-10-CM | POA: Diagnosis not present

## 2020-07-15 DIAGNOSIS — H524 Presbyopia: Secondary | ICD-10-CM | POA: Diagnosis not present

## 2020-07-15 DIAGNOSIS — H5212 Myopia, left eye: Secondary | ICD-10-CM | POA: Diagnosis not present

## 2020-07-15 LAB — RHEUMATOID FACTOR: Rheumatoid fact SerPl-aCnc: <14 IU/mL (ref <14)

## 2020-07-15 LAB — CYCLIC CITRUL PEPTIDE ANTIBODY, IGG: Cyclic Citrullin Peptide Ab: <16 UNITS

## 2020-07-15 LAB — ANA: Anti Nuclear Antibody (ANA): NEGATIVE

## 2020-07-18 MED FILL — traMADol HCL 50 MG TABS: 50 | 90 days supply | Qty: 360 | Fill #0

## 2020-07-18 MED FILL — VYVANSE 50 MG CAPSULE: 50 | 90 days supply | Qty: 90 | Fill #0

## 2020-07-19 ENCOUNTER — Encounter: Payer: Self-pay | Admitting: Plastic Surgery

## 2020-07-19 ENCOUNTER — Ambulatory Visit: Payer: 59 | Admitting: Plastic Surgery

## 2020-07-19 ENCOUNTER — Other Ambulatory Visit: Payer: Self-pay

## 2020-07-19 VITALS — BP 127/84 | HR 103 | Temp 98.3°F | Ht 67.0 in | Wt 218.6 lb

## 2020-07-19 DIAGNOSIS — M7918 Myalgia, other site: Secondary | ICD-10-CM

## 2020-07-19 DIAGNOSIS — Z719 Counseling, unspecified: Secondary | ICD-10-CM

## 2020-07-19 DIAGNOSIS — L989 Disorder of the skin and subcutaneous tissue, unspecified: Secondary | ICD-10-CM | POA: Insufficient documentation

## 2020-07-19 NOTE — Progress Notes (Signed)
Patient ID: Barbara Wood, female    DOB: 12/14/1970, 50 y.o.   MRN: [id number]   Chief Complaint  Patient presents with  . Advice Only    Patient is a 50 year old white female here for evaluation of her breasts.  The patient underwent a breast reduction when she was 50 years old.  She has noticed some tenderness around the scar areas and was concerned the something might need to be done.  Her sternal notch to right nipple distance is 27 cm and on the left 28 cm.  She is 5 feet 7 inches tall weighs 218 pounds.  She is not a smoker.  She has an autoimmune thyroid disease, migraines and psoriasis.  The scars seem to have healed very nicely.  There are no signs of skin breakdown.  She is due for a mammogram.  She does have some point tenderness around her chest area.  She works as a Archivist.   She is a Rodney Booze 1 also has an area that she is concerned about on her left chest it is a little pearly around the edges with an inner crater.  This is concerning for basal cell carcinoma.   Review of Systems  Constitutional: Negative for activity change and appetite change.  Eyes: Negative.   Respiratory: Negative.   Cardiovascular: Negative.   Genitourinary: Negative.   Musculoskeletal: Negative.   Hematological: Negative.     Past Medical History:  Diagnosis Date  . Allergy Age 18  . Asthma Since age 34  . Depression Age 6  . Heart murmur Mitral valve prolapse age 50  . Thyroid disease Since age 19    Past Surgical History:  Procedure Laterality Date  . BREAST SURGERY  Reduction age 50  . CHOLECYSTECTOMY  Age 50      Current Outpatient Medications:  .  albuterol (PROVENTIL HFA) 108 (90 Base) MCG/ACT inhaler, Inhale 2 puffs into the lungs every 4 (four) hours as needed for wheezing or shortness of breath., Disp: 8 g, Rfl: 3 .  ARIPiprazole (ABILIFY) 10 MG tablet, Take 10 mg by mouth daily., Disp: , Rfl:  .  ARIPiprazole (ABILIFY) 5 MG tablet, Take 2 tablets (10 mg  total) by mouth daily., Disp: 180 tablet, Rfl: 3 .  atenolol (TENORMIN) 100 MG tablet, Take 1 tablet (100 mg total) by mouth 2 (two) times daily., Disp: 180 tablet, Rfl: 3 .  benzonatate (TESSALON) 100 MG capsule, Take 1 capsule (100 mg total) by mouth 3 (three) times daily as needed for cough., Disp: 30 capsule, Rfl: 1 .  betamethasone dipropionate 0.05 % cream, Apply topically 2 (two) times daily., Disp: 45 g, Rfl: 5 .  Budesonide (RHINOCORT ALLERGY NA), Use 1 spray in each nostril twice daily., Disp: , Rfl:  .  budesonide-formoterol (SYMBICORT) 160-4.5 MCG/ACT inhaler, , Disp: , Rfl:  .  cetirizine (ZYRTEC ALLERGY) 10 MG tablet, Take 1 tablet by mouth 2 (two) times a day., Disp: , Rfl:  .  diclofenac (VOLTAREN) 75 MG EC tablet, , Disp: , Rfl:  .  Fluocinolone Acetonide Scalp (DERMA-SMOOTHE/FS SCALP) 0.01 % OIL, Apply TID, Disp: 118.28 mL, Rfl: 5 .  Galcanezumab-gnlm (EMGALITY) 120 MG/ML SOAJ, Inject 1 mL into the skin every 30 (thirty) days., Disp: 3 mL, Rfl: 3 .  lidocaine (LIDODERM) 5 %, , Disp: , Rfl:  .  lidocaine (LIDODERM) 5 %, Place 1 patch onto the skin daily. Remove & Discard patch within 12 hours or as directed by MD,  Disp: 30 patch, Rfl: 2 .  liothyronine (CYTOMEL) 5 MCG tablet, TAKE 1 TABLET (5 MCG TOTAL) BY MOUTH 3 (THREE) TIMES DAILY., Disp: 270 tablet, Rfl: 1 .  lisdexamfetamine (VYVANSE) 50 MG capsule, Take 1 capsule (50 mg total) by mouth daily., Disp: 90 capsule, Rfl: 0 .  metaxalone (SKELAXIN) 800 MG tablet, Take by mouth., Disp: , Rfl:  .  montelukast (SINGULAIR) 10 MG tablet, TAKE 1 TABLET BY MOUTH EVERYDAY AT BEDTIME, Disp: 90 tablet, Rfl: 3 .  omeprazole (PRILOSEC) 20 MG capsule, Take 1 capsule (20 mg total) by mouth daily., Disp: 30 capsule, Rfl: 3 .  promethazine (PHENERGAN) 25 MG tablet, Take 1 tablet (25 mg total) by mouth every 6 (six) hours as needed for nausea or vomiting., Disp: 30 tablet, Rfl: 3 .  Rimegepant Sulfate ([name]) 75 MG TBDP, Take 75 mg by mouth daily  as needed., Disp: 30 tablet, Rfl: 3 .  SYNTHROID 125 MCG tablet, Take 1 tablet (125 mcg total) by mouth daily before breakfast., Disp: 90 tablet, Rfl: 3 .  traMADol (ULTRAM) 50 MG tablet, Take 2 tablets (100 mg total) by mouth 2 (two) times daily., Disp: 360 tablet, Rfl: 0 .  traZODone (DESYREL) 50 MG tablet, TAKE 0.5-1 TABLETS (25-50 MG TOTAL) BY MOUTH AT BEDTIME AS NEEDED FOR SLEEP., Disp: 90 tablet, Rfl: 3   Objective:   Vitals:   07/19/20 1424  BP: 127/84  Pulse: (!) 103  Temp: 98.3 F (36.8 C)  SpO2: 98%    Physical Exam Vitals and nursing note reviewed.  Constitutional:      Appearance: Normal appearance.  Cardiovascular:     Rate and Rhythm: Normal rate.     Pulses: Normal pulses.  Pulmonary:     Effort: Pulmonary effort is normal. No respiratory distress.  Chest:    Skin:    General: Skin is warm.  Neurological:     Mental Status: She is alert and oriented to person, place, and time.  Psychiatric:        Mood and Affect: Mood normal.        Behavior: Behavior normal.        Thought Content: Thought content normal.     Assessment & Plan:  Myofascial pain  Changing skin lesion  Recommend excision of changing skin lesion that is concerning for basal cell carcinoma on her left tenderness.  A week or so for the excision.  I will plan to see her a month after her PT in follow up.  She agrees with the plan.    Pictures were obtained of the patient and placed in the chart with the patient's or guardian's permission.   [place], DO

## 2020-07-25 MED FILL — EMGALITY 120 MG/ML SOAJ: 120 | 90 days supply | Qty: 3 | Fill #0

## 2020-07-29 DIAGNOSIS — L578 Other skin changes due to chronic exposure to nonionizing radiation: Secondary | ICD-10-CM | POA: Diagnosis not present

## 2020-07-29 DIAGNOSIS — C44519 Basal cell carcinoma of skin of other part of trunk: Secondary | ICD-10-CM | POA: Diagnosis not present

## 2020-07-29 DIAGNOSIS — Z85828 Personal history of other malignant neoplasm of skin: Secondary | ICD-10-CM | POA: Diagnosis not present

## 2020-07-29 DIAGNOSIS — D225 Melanocytic nevi of trunk: Secondary | ICD-10-CM | POA: Diagnosis not present

## 2020-08-10 MED FILL — EMGALITY 120 MG/ML SOAJ: 120 | 90 days supply | Qty: 3 | Fill #0

## 2020-09-08 DIAGNOSIS — D485 Neoplasm of uncertain behavior of skin: Secondary | ICD-10-CM | POA: Diagnosis not present

## 2020-09-08 DIAGNOSIS — D225 Melanocytic nevi of trunk: Secondary | ICD-10-CM | POA: Diagnosis not present

## 2020-09-08 DIAGNOSIS — Z85828 Personal history of other malignant neoplasm of skin: Secondary | ICD-10-CM | POA: Diagnosis not present

## 2020-09-08 MED FILL — ATENOLOL 100 MG TAB: 100 | 90 days supply | Qty: 180 | Fill #1

## 2020-09-19 ENCOUNTER — Telehealth: Payer: Self-pay

## 2020-09-19 MED FILL — NURTEC 75 MG TBDP: 75 | 30 days supply | Qty: 8 | Fill #2

## 2020-09-19 MED FILL — MONTELUKAST SOD 10 MG TAB: 10 | 90 days supply | Qty: 90 | Fill #2

## 2020-09-19 NOTE — Telephone Encounter (Signed)
PA started for [name] 75mg  dispersible tablets.  Key: [id number]

## 2020-09-20 ENCOUNTER — Ambulatory Visit: Payer: 59 | Admitting: Plastic Surgery

## 2020-09-20 NOTE — Telephone Encounter (Signed)
PA for [name] ODT 75MG  tablet was approved, qty limit 18.0 with a day supply limit of 30.0

## 2020-09-20 NOTE — Telephone Encounter (Signed)
[employer] letter received for Approval with maxiumum refills of 12 from 09/19/2020 to 09/18/2021.

## 2020-09-22 MED FILL — FLUARIX QUADRIVALENT 0.5 ML: 0.5 | 1 days supply | Qty: 1 | Fill #0

## 2020-09-26 ENCOUNTER — Other Ambulatory Visit: Payer: Self-pay | Admitting: Family Medicine

## 2020-09-26 MED FILL — SYNTHROID 125 MCG TABLET: 125 | 30 days supply | Qty: 30 | Fill #0

## 2020-10-12 ENCOUNTER — Other Ambulatory Visit: Payer: Self-pay | Admitting: Family Medicine

## 2020-10-12 ENCOUNTER — Telehealth: Payer: Self-pay | Admitting: Family Medicine

## 2020-10-12 DIAGNOSIS — M7918 Myalgia, other site: Secondary | ICD-10-CM

## 2020-10-12 DIAGNOSIS — F988 Other specified behavioral and emotional disorders with onset usually occurring in childhood and adolescence: Secondary | ICD-10-CM

## 2020-10-12 MED ORDER — TRAMADOL HCL 50 MG PO TABS: 100.0000 mg | ORAL_TABLET | Freq: Two times a day (BID) | ORAL | 0 refills | Status: DC

## 2020-10-12 MED ORDER — LISDEXAMFETAMINE DIMESYLATE 50 MG PO CAPS: 50.0000 mg | ORAL_CAPSULE | Freq: Every day | ORAL | 0 refills | Status: DC

## 2020-10-12 NOTE — Telephone Encounter (Signed)
Patient notified VIA phone that prescriptions were sent to the pharmacy.  Dm/cma

## 2020-10-12 NOTE — Telephone Encounter (Signed)
Patient is calling and requesting a refill for Vyvanse and Tramadol sent to [facility], please advise. CB is [phone]

## 2020-10-12 NOTE — Telephone Encounter (Signed)
rxs refilled. Pt has appt in 1 wk

## 2020-10-12 NOTE — Telephone Encounter (Signed)
Hey Dr C,  Patient is requesting a refill on her two meds:  Vyvance   LR 07/14/20, #90, 0 rf  Tramadol LR 07/14/20, #360, 0 rf  LOV 07/14/20 FOV 10/19/20  Please review and advise.  Thanks.  Dm/cma

## 2020-10-17 MED FILL — VYVANSE 50 MG CAPSULE: 50 | 90 days supply | Qty: 90 | Fill #0

## 2020-10-17 MED FILL — traMADol HCL 50 MG TABS: 50 | 90 days supply | Qty: 360 | Fill #0

## 2020-10-18 ENCOUNTER — Other Ambulatory Visit: Payer: Self-pay

## 2020-10-19 ENCOUNTER — Encounter: Payer: Self-pay | Admitting: Family Medicine

## 2020-10-19 ENCOUNTER — Other Ambulatory Visit: Payer: Self-pay | Admitting: Family Medicine

## 2020-10-19 ENCOUNTER — Ambulatory Visit: Payer: 59 | Admitting: Family Medicine

## 2020-10-19 VITALS — BP 120/76 | HR 76 | Temp 97.8°F | Ht 67.0 in | Wt 213.6 lb

## 2020-10-19 DIAGNOSIS — F988 Other specified behavioral and emotional disorders with onset usually occurring in childhood and adolescence: Secondary | ICD-10-CM

## 2020-10-19 DIAGNOSIS — Z1231 Encounter for screening mammogram for malignant neoplasm of breast: Secondary | ICD-10-CM | POA: Diagnosis not present

## 2020-10-19 DIAGNOSIS — I340 Nonrheumatic mitral (valve) insufficiency: Secondary | ICD-10-CM

## 2020-10-19 DIAGNOSIS — G43909 Migraine, unspecified, not intractable, without status migrainosus: Secondary | ICD-10-CM | POA: Diagnosis not present

## 2020-10-19 DIAGNOSIS — Z79899 Other long term (current) drug therapy: Secondary | ICD-10-CM

## 2020-10-19 DIAGNOSIS — E559 Vitamin D deficiency, unspecified: Secondary | ICD-10-CM | POA: Diagnosis not present

## 2020-10-19 DIAGNOSIS — E063 Autoimmune thyroiditis: Secondary | ICD-10-CM | POA: Diagnosis not present

## 2020-10-19 DIAGNOSIS — M7918 Myalgia, other site: Secondary | ICD-10-CM | POA: Diagnosis not present

## 2020-10-19 DIAGNOSIS — R7301 Impaired fasting glucose: Secondary | ICD-10-CM

## 2020-10-19 DIAGNOSIS — R079 Chest pain, unspecified: Secondary | ICD-10-CM | POA: Diagnosis not present

## 2020-10-19 LAB — ALT: ALT: 11 U/L (ref 0–35)

## 2020-10-19 LAB — AST: AST: 16 U/L (ref 0–37)

## 2020-10-19 LAB — VITAMIN D 25 HYDROXY (VIT D DEFICIENCY, FRACTURES): VITD: 29.5 ng/mL — ABNORMAL LOW (ref 30.00–100.00)

## 2020-10-19 MED ORDER — LIOTHYRONINE SODIUM 5 MCG PO TABS: 5.0000 ug | ORAL_TABLET | Freq: Three times a day (TID) | ORAL | 1 refills | Status: DC

## 2020-10-19 MED ORDER — ATENOLOL 50 MG PO TABS: 50.0000 mg | ORAL_TABLET | Freq: Two times a day (BID) | ORAL | 3 refills | Status: DC

## 2020-10-19 MED ORDER — METAXALONE 800 MG PO TABS: 800.0000 mg | ORAL_TABLET | Freq: Three times a day (TID) | ORAL | 3 refills | Status: DC

## 2020-10-19 MED ORDER — SYNTHROID 125 MCG PO TABS: 125.0000 ug | ORAL_TABLET | Freq: Every day | ORAL | 3 refills | Status: DC

## 2020-10-19 MED FILL — LIOTHYRONINE SODIUM 5 MCG T: 5 | 90 days supply | Qty: 270 | Fill #0

## 2020-10-19 MED FILL — METAXALONE 800 MG TABLET: 800 | 30 days supply | Qty: 90 | Fill #0

## 2020-10-19 MED FILL — ATENOLOL 50 MG TABLET: 50 | 90 days supply | Qty: 180 | Fill #0

## 2020-10-19 NOTE — Progress Notes (Signed)
Barbara Wood is a 50 y.o. female  Chief Complaint  Patient presents with  . Follow-up    3 month f/u ADHD/med refills.     HPI: Barbara Wood is a 50 y.o. female seen today for routine f/u on chronic medical issues including ADD, myofascial pain, migraine headaches, hypothyroidism. She had an episode recently of non-reproducible chest pain that lasted a coupld of hours then resolved. She took as aspirin and a muscle relaxant. No associated symptoms. No fam h/o CAD, MI, CVA. fam h/o HTN.  Pt notes h/o ? Mitral regurg dx decades ago. Migraines overall well-controlled on current regimen. She would like to decrease her metoprolol from 100mg  BID which she takes soleley for migraine prophylaxis.  TFTs slightly low in 02/2020 but pt states this is typical for current med dose and where she feels best. Will cont.  Increased stress - in marital counseling, daughter is questioning if she identifies more as a female vs female. Periods had stopped x 11 mo and then pt started having period every 3+ wks.  Needs mammo referral. Overdue and mother has a h/o breast cancer s/p lumpectomy.  Pt is UTD on flu and covid vaccines.   Patient Active Problem List   Diagnosis Date Noted  . Changing skin lesion 07/19/2020  . 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 Asthma 07/29/2019  . Hashimoto's disease 07/29/2019  . Myofascial pain 07/29/2019  . Neck pain 07/29/2019  . Psoriasis 07/29/2019  . Migraine 07/26/1986   Past Medical History:  Diagnosis Date  . Allergy Age 24  . Asthma Since age 35  . Depression Age 89  . Heart murmur Mitral valve prolapse age 74  . Thyroid disease Since age 55    Past Surgical History:  Procedure Laterality Date  . BREAST SURGERY  Reduction age 77  . CHOLECYSTECTOMY  Age 47    Social History   Socioeconomic History  . Marital status: Married    Spouse name: Not on file  . Number of children: Not on file  . Years of education: Not on file  . Highest education level: Not  on file  Occupational History  . Not on file  Tobacco Use  . Smoking status: Never Smoker  . Smokeless tobacco: Never Used  Substance and Sexual Activity  . Alcohol use: Yes    Comment: 1-2 drinks per month  . Drug use: Never  . Sexual activity: Yes    Comment: Infertile  Other Topics Concern  . Not on file  Social History Narrative  . Not on file   Social Determinants of Health   Financial Resource Strain:   . Difficulty of Paying Living Expenses: Not on file  Food Insecurity:   . Worried About Charity fundraiser in the Last Year: Not on file  . Ran Out of Food in the Last Year: Not on file  Transportation Needs:   . Lack of Transportation (Medical): Not on file  . Lack of Transportation (Non-Medical): Not on file  Physical Activity:   . Days of Exercise per Week: Not on file  . Minutes of Exercise per Session: Not on file  Stress:   . Feeling of Stress : Not on file  Social Connections:   . Frequency of Communication with Friends and Family: Not on file  . Frequency of Social Gatherings with Friends and Family: Not on file  . Attends Religious Services: Not on file  . Active Member of Clubs or Organizations: Not on file  .  Attends Archivist Meetings: Not on file  . Marital Status: Not on file  Intimate Partner Violence:   . Fear of Current or Ex-Partner: Not on file  . Emotionally Abused: Not on file  . Physically Abused: Not on file  . Sexually Abused: Not on file    Family History  Problem Relation Age of Onset  . Asthma Sister   . Miscarriages / Stillbirths Sister   . Cancer Mother   . Depression Mother      Immunization History  Administered Date(s) Administered  . Influenza,inj,Quad PF,6+ Mos 09/30/2019, 09/22/2020  . Influenza-Unspecified 11/02/2013  . PFIZER SARS-COV-2 Vaccination 12/29/2019, 01/26/2020    Outpatient Encounter Medications as of 10/19/2020  Medication Sig  . albuterol (PROVENTIL HFA) 108 (90 Base) MCG/ACT inhaler  Inhale 2 puffs into the lungs every 4 (four) hours as needed for wheezing or shortness of breath.  . ARIPiprazole (ABILIFY) 10 MG tablet Take 10 mg by mouth daily.  . ARIPiprazole (ABILIFY) 5 MG tablet Take 2 tablets (10 mg total) by mouth daily.  . betamethasone dipropionate 0.05 % cream Apply topically 2 (two) times daily.  . Budesonide (RHINOCORT ALLERGY NA) Use 1 spray in each nostril twice daily.  . budesonide-formoterol (SYMBICORT) 160-4.5 MCG/ACT inhaler   . cetirizine (ZYRTEC ALLERGY) 10 MG tablet Take 1 tablet by mouth 2 (two) times a day.  . diclofenac (VOLTAREN) 75 MG EC tablet   . Fluocinolone Acetonide Scalp (DERMA-SMOOTHE/FS SCALP) 0.01 % OIL Apply TID  . Galcanezumab-gnlm (EMGALITY) 120 MG/ML SOAJ Inject 1 mL into the skin every 30 (thirty) days.  Marland Kitchen lidocaine (LIDODERM) 5 %   . lidocaine (LIDODERM) 5 % Place 1 patch onto the skin daily. Remove & Discard patch within 12 hours or as directed by MD  . liothyronine (CYTOMEL) 5 MCG tablet Take 1 tablet (5 mcg total) by mouth 3 (three) times daily.  Marland Kitchen lisdexamfetamine (VYVANSE) 50 MG capsule Take 1 capsule (50 mg total) by mouth daily.  . metaxalone (SKELAXIN) 800 MG tablet Take 1 tablet (800 mg total) by mouth 3 (three) times daily.  . montelukast (SINGULAIR) 10 MG tablet TAKE 1 TABLET BY MOUTH EVERYDAY AT BEDTIME  . omeprazole (PRILOSEC) 20 MG capsule Take 1 capsule (20 mg total) by mouth daily.  . promethazine (PHENERGAN) 25 MG tablet Take 1 tablet (25 mg total) by mouth every 6 (six) hours as needed for nausea or vomiting.  . Rimegepant Sulfate ([name]) 75 MG TBDP Take 75 mg by mouth daily as needed.  Marland Kitchen SYNTHROID 125 MCG tablet Take 1 tablet (125 mcg total) by mouth daily before breakfast.  . traMADol (ULTRAM) 50 MG tablet Take 2 tablets (100 mg total) by mouth 2 (two) times daily.  . traZODone (DESYREL) 50 MG tablet TAKE 0.5-1 TABLETS (25-50 MG TOTAL) BY MOUTH AT BEDTIME AS NEEDED FOR SLEEP.  . [DISCONTINUED] atenolol (TENORMIN)  100 MG tablet Take 1 tablet (100 mg total) by mouth 2 (two) times daily.  . [DISCONTINUED] liothyronine (CYTOMEL) 5 MCG tablet TAKE 1 TABLET (5 MCG TOTAL) BY MOUTH 3 (THREE) TIMES DAILY.  . [DISCONTINUED] metaxalone (SKELAXIN) 800 MG tablet Take by mouth.  . [DISCONTINUED] SYNTHROID 125 MCG tablet TAKE 1 TABLET BY MOUTH DAILY BEFORE BREAKFAST  . atenolol (TENORMIN) 50 MG tablet Take 1 tablet (50 mg total) by mouth 2 (two) times daily.  . benzonatate (TESSALON) 100 MG capsule Take 1 capsule (100 mg total) by mouth 3 (three) times daily as needed for cough. (Patient not taking:  Reported on 10/19/2020)   No facility-administered encounter medications on file as of 10/19/2020.     ROS: Pertinent positives and negatives noted in HPI. Remainder of ROS non-contributory  Allergies  Allergen Reactions  . Amoxicillin-Pot Clavulanate Nausea And Vomiting  . Sertraline Other (See Comments)    hallucinations   . Sulfamethoxazole-Trimethoprim Hives  . Triptans Other (See Comments)    Chest pain     BP 120/76   Pulse 76   Temp 97.8 F (36.6 C) (Temporal)   Ht 5\' 7"  (1.702 m)   Wt 213 lb 9.6 oz (96.9 kg)   SpO2 97%   BMI 33.45 kg/m    BP Readings from Last 3 Encounters:  10/19/20 120/76  07/19/20 127/84  07/14/20 118/80   Pulse Readings from Last 3 Encounters:  10/19/20 76  07/19/20 (!) 103  07/14/20 74   Wt Readings from Last 3 Encounters:  10/19/20 213 lb 9.6 oz (96.9 kg)  07/19/20 218 lb 9.6 oz (99.2 kg)  07/14/20 213 lb 12.8 oz (97 kg)    Physical Exam Constitutional:      General: She is not in acute distress.    Appearance: Normal appearance. She is not ill-appearing.  Cardiovascular:     Rate and Rhythm: Normal rate and regular rhythm.     Pulses: Normal pulses.  Pulmonary:     Effort: Pulmonary effort is normal. No respiratory distress.     Breath sounds: Normal breath sounds. No wheezing.  Chest:     Chest wall: No tenderness.  Musculoskeletal:     Right lower  leg: No edema.     Left lower leg: No edema.  Neurological:     Mental Status: She is alert and oriented to person, place, and time.  Psychiatric:        Mood and Affect: Mood normal.        Behavior: Behavior normal.      A/P:  1. High risk medications (not anticoagulants) long-term use - DRUG MONITORING, PANEL 8 WITH CONFIRMATION, URINE  2. Attention deficit disorder, unspecified hyperactivity presence - cont vyvanse - database reviewed and appropriate - needs UDS and controlled substance agreement - done today - f/u in 3 mo  3. Myofascial pain - at baseline Refill: - metaxalone (SKELAXIN) 800 MG tablet; Take 1 tablet (800 mg total) by mouth 3 (three) times daily.  Dispense: 90 tablet; Refill: 3 - cont ultram 100mg  BID - database reviewed and appropriate - needs UDS and controlled substance agreement - done today - f/u in 3 mo  4. Migraine without status migrainosus, not intractable, unspecified migraine type - well-controlled on current med regimen Decrease: - atenolol (TENORMIN) 50 MG tablet; Take 1 tablet (50 mg total) by mouth 2 (two) times daily.  Dispense: 180 tablet; Refill: 3 - down from 100mg  BID - cont emgality, PRN nurtec. If decrease in atenolol causes increase in headaches, will change nurtec to QOD maintenance dosing  5. Hashimoto's disease - stable Refill: - liothyronine (CYTOMEL) 5 MCG tablet; Take 1 tablet (5 mcg total) by mouth 3 (three) times daily.  Dispense: 270 tablet; Refill: 1 - SYNTHROID 125 MCG tablet; Take 1 tablet (125 mcg total) by mouth daily before breakfast.  Dispense: 90 tablet; Refill: 3  6. Screening mammogram for breast cancer - MM DIGITAL SCREENING BILATERAL; Future  7. Elevated fasting glucose - Hemoglobin A1c  8. Vitamin D deficiency - VITAMIN D 25 Hydroxy (Vit-D Deficiency, Fractures)  9. Chest pain, unspecified type 10. Mitral valve  insufficiency, unspecified etiology - unclear etiology - ? MSK vs stress/anxiety vs  possibly cardiac - pt needs FLP but is not fasting and just ate lunch - EKG 12-Lead - NSR @ 66bpm, no acute changes and no q waves - ECHOCARDIOGRAM COMPLETE; Future   This visit occurred during the SARS-CoV-2 public health emergency.  Safety protocols were in place, including screening questions prior to the visit, additional usage of staff PPE, and extensive cleaning of exam room while observing appropriate contact time as indicated for disinfecting solutions.

## 2020-10-20 ENCOUNTER — Encounter: Payer: Self-pay | Admitting: Family Medicine

## 2020-10-20 LAB — HEMOGLOBIN A1C: Hgb A1c MFr Bld: 6.2 % (ref 4.6–6.5)

## 2020-10-20 MED FILL — SYNTHROID 125 MCG TABLET: 125 | 90 days supply | Qty: 90 | Fill #0

## 2020-10-21 LAB — DRUG MONITORING, PANEL 8 WITH CONFIRMATION, URINE
6 Acetylmorphine: NEGATIVE ng/mL (ref <10)
Alcohol Metabolites: NEGATIVE ng/mL
Amphetamine: 7198 ng/mL — ABNORMAL HIGH (ref <250)
Amphetamines: POSITIVE ng/mL — AB (ref <500)
Benzodiazepines: NEGATIVE ng/mL (ref <100)
Buprenorphine, Urine: NEGATIVE ng/mL (ref <5)
Cocaine Metabolite: NEGATIVE ng/mL (ref <150)
Creatinine: 84.5 mg/dL
MDMA: NEGATIVE ng/mL (ref <500)
Marijuana Metabolite: NEGATIVE ng/mL (ref <20)
Methamphetamine: NEGATIVE ng/mL (ref <250)
Opiates: NEGATIVE ng/mL (ref <100)
Oxidant: NEGATIVE ug/mL
Oxycodone: NEGATIVE ng/mL (ref <100)
pH: 5.2 (ref 4.5–9.0)

## 2020-10-21 LAB — DM TEMPLATE

## 2020-10-26 ENCOUNTER — Encounter (HOSPITAL_COMMUNITY): Payer: Self-pay | Admitting: Family Medicine

## 2020-11-07 ENCOUNTER — Telehealth (HOSPITAL_COMMUNITY): Payer: Self-pay | Admitting: Family Medicine

## 2020-11-07 NOTE — Telephone Encounter (Signed)
Just an FYI. We have made several attempts to contact this patient including sending a letter to schedule or reschedule their echocardiogram. We will be removing the patient from the echo [place].   10/26/2020 MAILED LETTER [facility]  10/26/20 Called and NVM set up @ 9:36/[facility]  10/24/20 Called and NVM set up @ 3:46/[facility]  10/20/19 called and NVM set up @ 3:13/[facility]      Thank you

## 2020-11-09 NOTE — Telephone Encounter (Signed)
Spoke to patient regarding them trying to get in touch with her for the Echo.  Found out the phone number for her to call [facility] [phone] and number for [place] for mammogram [phone].   Patient will call and schedule those appointments.   Dm/cma

## 2020-11-09 NOTE — Telephone Encounter (Signed)
Please call pt to let her know they called her 3 times to scheduled echo. If she wants to proceed with having it done, please re-enter referral

## 2020-11-14 MED FILL — ARIPIPRAZOLE 10 MG TABS: 10 | 90 days supply | Qty: 90 | Fill #1

## 2020-11-14 MED FILL — NURTEC 75 MG TBDP: 75 | 30 days supply | Qty: 8 | Fill #3

## 2020-11-14 MED FILL — EMGALITY 120 MG/ML SOAJ: 120 | 90 days supply | Qty: 3 | Fill #1

## 2020-12-05 NOTE — Progress Notes (Signed)
Office Visit Note  Patient: Barbara Wood             Date of Birth: [birth year]           MRN: [id number]             PCP: Ronnald Nian, DO Referring: Ronnald Nian, DO Visit Date: 12/16/2020 Occupation: _0 @  Subjective:  No chief complaint on file.   History of Present Illness: Barbara Wood is a 50 y.o. female seen in consultation per request of her PCP.  According to her she started having symptoms of psoriasis when she was in her 75s.  It has been treated with topical agents over the years.  Psoriasis is mostly in the scalp and lower extremities.  She was recently evaluated by Dr. Martin Majestic at [facility].  Who recommended methotrexate but she was hesitant to go on methotrexate due to the pandemic and also history of frequent upper respiratory tract infections.  She states for the last 3 years she has been experiencing increased pain and stiffness in her hands and decreased grip strength.  She also has discomfort in her knee joints she has history of right lateral release in 2019.  She has history of discomfort in her cervical spine, shoulders, lateral epicondylitis, SI joint discomfort, hip joint pain, trochanteric bursitis and plantar fasciitis.  She states she gets plantar fasciitis episodes about every couple of years and the last for about 6 months.  She notices swelling in her hands.  She gives history of Raynaud's phenomenon for the last 10 years.  She had an episode of iritis about 10 years ago which was associated with an infection without any recurrence.  She has a strong family history of psoriatic arthritis, ankylosing spondylitis and eosinophilic esophagitis.  He is gravida 1, para 1.  Activities of Daily Living:  Patient reports morning stiffness for 1-2 hours.   Patient Reports nocturnal pain.  Difficulty dressing/grooming: Denies Difficulty climbing stairs: Reports Difficulty getting out of chair: Reports Difficulty using hands for taps,  buttons, cutlery, and/or writing: Reports  Review of Systems  Constitutional: Positive for fatigue. Negative for night sweats, weight gain and weight loss.  HENT: Positive for mouth dryness. Negative for mouth sores, trouble swallowing, trouble swallowing and nose dryness.   Eyes: Positive for itching and dryness. Negative for pain, redness and visual disturbance.  Respiratory: Negative for cough, shortness of breath and difficulty breathing.   Cardiovascular: Negative for chest pain, palpitations, hypertension, irregular heartbeat and swelling in legs/feet.  Gastrointestinal: Negative for blood in stool, constipation and diarrhea.  Endocrine: Negative for increased urination.  Genitourinary: Negative for difficulty urinating and vaginal dryness.  Musculoskeletal: Positive for arthralgias, joint pain, joint swelling, myalgias, morning stiffness, muscle tenderness and myalgias. Negative for muscle weakness.  Skin: Positive for color change, rash and sensitivity to sunlight. Negative for hair loss, skin tightness and ulcers.  Allergic/Immunologic: Negative for susceptible to infections.  Neurological: Positive for dizziness, numbness, headaches and weakness. Negative for memory loss and night sweats.  Hematological: Positive for bruising/bleeding tendency. Negative for swollen glands.  Psychiatric/Behavioral: Positive for depressed mood and sleep disturbance. Negative for confusion. The patient is not nervous/anxious.     PMFS History:  Patient Active Problem List   Diagnosis Date Noted  . Changing skin lesion 07/19/2020  . 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 Asthma 07/29/2019  . Hashimoto's disease 07/29/2019  . Myofascial pain 07/29/2019  . Neck pain 07/29/2019  . Psoriasis 07/29/2019  . Migraine  07/26/1986    Past Medical History:  Diagnosis Date  . Allergy Age 34  . Asthma Since age 6  . Depression Age 89  . Heart murmur Mitral valve prolapse age 55  . Thyroid disease Since age  48    Family History  Problem Relation Age of Onset  . Asthma Sister   . Miscarriages / Stillbirths Sister   . Depression Sister   . Psoriasis Sister   . Cancer Mother   . Depression Mother   . Hashimoto's thyroiditis Mother   . Diabetes Mother   . Bipolar disorder Mother   . Hypertension Father   . Ankylosing spondylitis Father   . Psoriasis Father   . Psoriasis Brother   . Migraines Brother   . ADD / ADHD Brother   . Eczema Brother   . Allergies Brother   . Psoriasis Maternal Aunt   . Psoriasis Sister   . Arthritis Sister        psoriatic arthritis   . Psoriasis Sister   . Rheum arthritis Sister   . Depression Sister   . Anxiety disorder Sister   . Eczema Sister   . Psoriasis Brother   . ADD / ADHD Daughter   . Eczema Daughter   . Asthma Daughter    Past Surgical History:  Procedure Laterality Date  . BREAST SURGERY  Reduction age 72  . CHOLECYSTECTOMY  Age 61  . KNEE ARTHROSCOPY W/ LATERAL RELEASE Right    Social History   Social History Narrative  . Not on file   Immunization History  Administered Date(s) Administered  . Influenza,inj,Quad PF,6+ Mos 09/30/2019, 09/22/2020  . Influenza-Unspecified 11/02/2013  . PFIZER SARS-COV-2 Vaccination 12/29/2019, 01/26/2020, 11/28/2020     Objective: Vital Signs: BP 127/86 (BP Location: Right Arm, Patient Position: Sitting, Cuff Size: Normal)   Pulse 76   Resp 16   Ht 5' 6.5" (1.689 m)   Wt 212 lb 12.8 oz (96.5 kg)   BMI 33.83 kg/m    Physical Exam Vitals and nursing note reviewed.  Constitutional:      Appearance: She is well-developed and well-nourished.  HENT:     Head: Normocephalic and atraumatic.  Eyes:     Extraocular Movements: EOM normal.     Conjunctiva/sclera: Conjunctivae normal.  Cardiovascular:     Rate and Rhythm: Normal rate and regular rhythm.     Pulses: Intact distal pulses.     Heart sounds: Normal heart sounds.  Pulmonary:     Effort: Pulmonary effort is normal.     Breath  sounds: Normal breath sounds.  Abdominal:     General: Bowel sounds are normal.     Palpations: Abdomen is soft.  Musculoskeletal:     Cervical back: Normal range of motion.  Lymphadenopathy:     Cervical: No cervical adenopathy.  Skin:    General: Skin is warm and dry.     Capillary Refill: Capillary refill takes less than 2 seconds.     Comments: Psoriasis patches were noted in the scalp.  No nail pitting was noted.  No sclerodactyly or nailbed capillary changes were noted.  Neurological:     Mental Status: She is alert and oriented to person, place, and time.  Psychiatric:        Mood and Affect: Mood and affect normal.        Behavior: Behavior normal.      Musculoskeletal Exam: Stiffness and discomfort range of motion of the cervical spine.  Shoulder joints, elbow  joints, wrist joints, MCPs PIPs and DIPs with good range of motion with no synovitis.  Hip joints, knee joints, ankles, MTPs and PIPs with good range of motion with no synovitis.  She had tenderness over bilateral trochanteric bursa and some tenderness over SI joints and lower lumbar region.  She had bilateral pes cavus with callus formation on the plantar surface of her MTPs.  CDAI Exam: CDAI Score: -- Patient Global: --; Provider Global: -- Swollen: --; Tender: -- Joint Exam 12/16/2020   No joint exam has been documented for this visit   There is currently no information documented on the homunculus. Go to the Rheumatology activity and complete the homunculus joint exam.  Investigation: No additional findings.  Imaging: ECHOCARDIOGRAM COMPLETE  Result Date: 12/07/2020    ECHOCARDIOGRAM REPORT   Patient Name:   DR. Courtney Heys Date of Exam: 12/07/2020 Medical Rec #:  [id number]        Height:       67.0 in Accession #:    [id number]       Weight:       213.6 lb Date of Birth:  [birth year]       BSA:          2.080 m Patient Age:    76 years         BP:           120/76 mmHg Patient Gender: F                HR:            68 bpm. Exam Location:  [facility] [place] Procedure: 2D Echo, Cardiac Doppler, Color Doppler and Intracardiac            Opacification Agent Indications:    R07.9 Chest pain  History:        Patient has no prior history of Echocardiogram examinations.  Sonographer:    Jessee Avers, RDCS Referring Phys: [id number] Marquez Comments: Technically difficult study due to poor echo windows and suboptimal apical window. IMPRESSIONS  1. Left ventricular ejection fraction, by estimation, is 60 to 65%. The left ventricle has normal function. The left ventricle has no regional wall motion abnormalities. Left ventricular diastolic parameters were normal.  2. Right ventricular systolic function is normal. The right ventricular size is normal. Tricuspid regurgitation signal is inadequate for assessing PA pressure.  3. The mitral valve is normal in structure. Trace mitral valve regurgitation. No evidence of mitral stenosis.  4. The aortic valve is tricuspid. Aortic valve regurgitation is not visualized. No aortic stenosis is present.  5. The inferior vena cava is normal in size with greater than 50% respiratory variability, suggesting right atrial pressure of 3 mmHg. Conclusion(s)/Recommendation(s): Normal biventricular function without evidence of hemodynamically significant valvular heart disease. FINDINGS  Left Ventricle: Left ventricular ejection fraction, by estimation, is 60 to 65%. The left ventricle has normal function. The left ventricle has no regional wall motion abnormalities. Definity contrast agent was given IV to delineate the left ventricular  endocardial borders. The left ventricular internal cavity size was normal in size. There is no left ventricular hypertrophy. Left ventricular diastolic parameters were normal. Right Ventricle: The right ventricular size is normal. No increase in right ventricular wall thickness. Right ventricular systolic function is normal. Tricuspid regurgitation  signal is inadequate for assessing PA pressure. Left Atrium: Left atrial size was normal in size. Right Atrium: Right atrial size was normal in size. Pericardium: There is  no evidence of pericardial effusion. Mitral Valve: The mitral valve is normal in structure. Trivial mitral valve regurgitation. No evidence of mitral valve stenosis. Tricuspid Valve: The tricuspid valve is normal in structure. Tricuspid valve regurgitation is trivial. No evidence of tricuspid stenosis. Aortic Valve: The aortic valve is tricuspid. Aortic valve regurgitation is not visualized. No aortic stenosis is present. Pulmonic Valve: The pulmonic valve was normal in structure. Pulmonic valve regurgitation is not visualized. No evidence of pulmonic stenosis. Aorta: The aortic root is normal in size and structure. Venous: The inferior vena cava was not well visualized. The inferior vena cava is normal in size with greater than 50% respiratory variability, suggesting right atrial pressure of 3 mmHg. IAS/Shunts: The atrial septum is grossly normal.  LEFT VENTRICLE PLAX 2D LVIDd:         4.00 cm  Diastology LVIDs:         2.80 cm  LV e' medial:    8.05 cm/s LV PW:         0.90 cm  LV E/e' medial:  9.0 LV IVS:        0.70 cm  LV e' lateral:   11.60 cm/s LVOT diam:     1.90 cm  LV E/e' lateral: 6.3 LV SV:         54 LV SV Index:   26 LVOT Area:     2.84 cm  RIGHT VENTRICLE RV Basal diam:  3.20 cm RV S prime:     11.30 cm/s TAPSE (M-mode): 2.5 cm LEFT ATRIUM             Index       RIGHT ATRIUM           Index LA diam:        4.10 cm 1.97 cm/m  RA Pressure: 3.00 mmHg LA Vol (A2C):   26.4 ml 12.69 ml/m RA Area:     13.30 cm LA Vol (A4C):   45.0 ml 21.64 ml/m RA Volume:   30.60 ml  14.71 ml/m LA Biplane Vol: 36.0 ml 17.31 ml/m  AORTIC VALVE LVOT Vmax:   96.60 cm/s LVOT Vmean:  66.600 cm/s LVOT VTI:    0.190 m  AORTA Ao Root diam: 3.25 cm Ao Asc diam:  3.60 cm MITRAL VALVE               TRICUSPID VALVE                            Estimated RAP:   3.00 mmHg  MV E velocity: 72.80 cm/s  SHUNTS MV A velocity: 74.60 cm/s  Systemic VTI:  0.19 m MV E/A ratio:  0.98        Systemic Diam: 1.90 cm Cherlynn Kaiser MD Electronically signed by Cherlynn Kaiser MD Signature Date/Time: 12/07/2020/2:30:22 PM    Final     Recent Labs: Lab Results  Component Value Date   WBC 9.4 02/05/2020   HGB 12.3 02/05/2020   PLT 270 02/05/2020   NA 141 04/21/2020   K 3.9 04/21/2020   CL 103 04/21/2020   CO2 30 04/21/2020   GLUCOSE 101 (H) 04/21/2020   BUN 10 04/21/2020   CREATININE 0.70 04/21/2020   BILITOT 0.9 11/16/2019   ALKPHOS 81 11/16/2019   AST 16 10/19/2020   ALT 11 10/19/2020   PROT 7.1 11/16/2019   ALBUMIN 3.6 11/16/2019   CALCIUM 9.2 04/21/2020   GFRAA >60 11/16/2019  Speciality Comments: No specialty comments available.  Procedures:  No procedures performed Allergies: Amoxicillin-pot clavulanate, Sertraline, Sulfamethoxazole-trimethoprim, and Triptans   Assessment / Plan:     Visit Diagnoses: Intermittent pain and swelling of hand - 07/14/20: ANA negative, CRP 1.3, ESR 20, anti-CCP:16, RF<14 -she gives history of intermittent pain and swelling in her hands.  She had no synovitis on my examination today.  Plan: XR Hand 2 View Right, XR Hand 2 View Left,  x-ray of bilateral hands were consistent with osteoarthritis.  Sedimentation rate.  High plan to schedule ultrasound of her bilateral hands.  Chronic pain of both knees -she had right knee joint lateral release when she was a teenager.  She continues to have pain and discomfort in her bilateral knee joints.  No warmth swelling or effusion was noted.  Plan: XR KNEE 3 VIEW RIGHT, XR KNEE 3 VIEW LEFT.  Left mild chondromalacia patella was noted.  Neck pain -she has discomfort and stiffness in her cervical spine.  Plan: XR Cervical Spine 2 or 3 views.  C5-C6 narrowing and anterior osteophytes were noted.  Chronic SI joint pain -she complains of off and on SI joint discomfort.  Plan: XR Pelvis  1-2 Views.  X-ray of SI joints were unremarkable.   Pes cavus-she has bilateral pes cavus and callus formation under the ball of her feet.  Wearing shoes with good arch support was discussed.  She also gives history of frequent plantar fasciitis.  Psoriasis-she has psoriasis patches on her scalp.  She states she has psoriasis on her lower extremities at times.  She states her scalp psoriasis was quite severe this year.  Dr. April Manson discussed methotrexate use but the patient declined that she is concerned about the pandemic and frequent upper respiratory tract infections.  She states she was given steroids for bronchitis which cleared up her psoriasis.  She uses topical agents.  Bilateral trochanteric bursitis-she has ongoing trochanteric bursitis which could be related to myofascial pain.  She also gives history of recurrent lateral epicondylitis, plantar fasciitis and bicipital tendinitis.  Raynaud's disease without gangrene-she states her hands does take cold and sometimes change color when she is in the grocery store but otherwise Raynauds is not very active.  Myofascial pain-she continues to have some generalized pain and discomfort.  Other fatigue-related to insomnia and myofascial pain.  Primary insomnia-she takes medications.  Plantar neuroma, right  Hashimoto's disease  History of asthma  Hx of migraines  History of depression  Attention deficit hyperactivity disorder (ADHD), combined type  Gastroesophageal reflux disease without esophagitis  Family history of psoriatic arthritis - Sister.  Family history of ankylosing spondylitis - Father  Family history of eosinophilic esophagitis - Brother and sister  Orders: Orders Placed This Encounter  Procedures  . XR Cervical Spine 2 or 3 views  . XR Pelvis 1-2 Views  . XR Hand 2 View Right  . XR Hand 2 View Left  . XR KNEE 3 VIEW RIGHT  . XR KNEE 3 VIEW LEFT  . Sedimentation rate   No orders of the defined types were  placed in this encounter. .  Follow-Up Instructions: Return for Psoriasis, joint pain.   Bo Merino, MD  Note - This record has been created using Editor, commissioning.  Chart creation errors have been sought, but may not always  have been located. Such creation errors do not reflect on  the standard of medical care.,

## 2020-12-07 ENCOUNTER — Ambulatory Visit (HOSPITAL_COMMUNITY): Payer: 59 | Attending: Internal Medicine

## 2020-12-07 ENCOUNTER — Other Ambulatory Visit: Payer: Self-pay

## 2020-12-07 DIAGNOSIS — I340 Nonrheumatic mitral (valve) insufficiency: Secondary | ICD-10-CM | POA: Diagnosis not present

## 2020-12-07 DIAGNOSIS — R079 Chest pain, unspecified: Secondary | ICD-10-CM | POA: Diagnosis not present

## 2020-12-07 LAB — ECHOCARDIOGRAM COMPLETE
Area-P 1/2: 2.62 cm2
S' Lateral: 2.8 cm

## 2020-12-07 MED ORDER — PERFLUTREN LIPID MICROSPHERE
1.0000 mL | INTRAVENOUS | Status: AC | PRN
  Administered 2020-12-07: 1 mL via INTRAVENOUS

## 2020-12-16 ENCOUNTER — Ambulatory Visit: Payer: Self-pay

## 2020-12-16 ENCOUNTER — Ambulatory Visit: Payer: 59 | Admitting: Rheumatology

## 2020-12-16 ENCOUNTER — Encounter: Payer: Self-pay | Admitting: Rheumatology

## 2020-12-16 ENCOUNTER — Other Ambulatory Visit: Payer: Self-pay

## 2020-12-16 VITALS — BP 127/86 | HR 76 | Resp 16 | Ht 66.5 in | Wt 212.8 lb

## 2020-12-16 DIAGNOSIS — L409 Psoriasis, unspecified: Secondary | ICD-10-CM | POA: Diagnosis not present

## 2020-12-16 DIAGNOSIS — M79643 Pain in unspecified hand: Secondary | ICD-10-CM

## 2020-12-16 DIAGNOSIS — G8929 Other chronic pain: Secondary | ICD-10-CM

## 2020-12-16 DIAGNOSIS — M7918 Myalgia, other site: Secondary | ICD-10-CM

## 2020-12-16 DIAGNOSIS — K219 Gastro-esophageal reflux disease without esophagitis: Secondary | ICD-10-CM

## 2020-12-16 DIAGNOSIS — Z8379 Family history of other diseases of the digestive system: Secondary | ICD-10-CM

## 2020-12-16 DIAGNOSIS — Z8261 Family history of arthritis: Secondary | ICD-10-CM

## 2020-12-16 DIAGNOSIS — M79642 Pain in left hand: Secondary | ICD-10-CM

## 2020-12-16 DIAGNOSIS — Q667 Congenital pes cavus, unspecified foot: Secondary | ICD-10-CM

## 2020-12-16 DIAGNOSIS — M79641 Pain in right hand: Secondary | ICD-10-CM | POA: Diagnosis not present

## 2020-12-16 DIAGNOSIS — R5383 Other fatigue: Secondary | ICD-10-CM

## 2020-12-16 DIAGNOSIS — M25562 Pain in left knee: Secondary | ICD-10-CM

## 2020-12-16 DIAGNOSIS — E063 Autoimmune thyroiditis: Secondary | ICD-10-CM

## 2020-12-16 DIAGNOSIS — M7062 Trochanteric bursitis, left hip: Secondary | ICD-10-CM

## 2020-12-16 DIAGNOSIS — M7989 Other specified soft tissue disorders: Secondary | ICD-10-CM

## 2020-12-16 DIAGNOSIS — Z8709 Personal history of other diseases of the respiratory system: Secondary | ICD-10-CM

## 2020-12-16 DIAGNOSIS — F5101 Primary insomnia: Secondary | ICD-10-CM | POA: Diagnosis not present

## 2020-12-16 DIAGNOSIS — F902 Attention-deficit hyperactivity disorder, combined type: Secondary | ICD-10-CM

## 2020-12-16 DIAGNOSIS — Z8669 Personal history of other diseases of the nervous system and sense organs: Secondary | ICD-10-CM

## 2020-12-16 DIAGNOSIS — G5761 Lesion of plantar nerve, right lower limb: Secondary | ICD-10-CM

## 2020-12-16 DIAGNOSIS — M25561 Pain in right knee: Secondary | ICD-10-CM

## 2020-12-16 DIAGNOSIS — Z8659 Personal history of other mental and behavioral disorders: Secondary | ICD-10-CM

## 2020-12-16 DIAGNOSIS — M7061 Trochanteric bursitis, right hip: Secondary | ICD-10-CM

## 2020-12-16 DIAGNOSIS — I7300 Raynaud's syndrome without gangrene: Secondary | ICD-10-CM

## 2020-12-16 DIAGNOSIS — M533 Sacrococcygeal disorders, not elsewhere classified: Secondary | ICD-10-CM | POA: Diagnosis not present

## 2020-12-16 DIAGNOSIS — Z8269 Family history of other diseases of the musculoskeletal system and connective tissue: Secondary | ICD-10-CM

## 2020-12-16 DIAGNOSIS — M542 Cervicalgia: Secondary | ICD-10-CM

## 2020-12-16 DIAGNOSIS — Z840 Family history of diseases of the skin and subcutaneous tissue: Secondary | ICD-10-CM

## 2020-12-16 LAB — SEDIMENTATION RATE: Sed Rate: 11 mm/h (ref 0–20)

## 2020-12-16 NOTE — Patient Instructions (Signed)
Journal for Nurse Practitioners, 15(4), 263-267. Retrieved October 06, 2018 from [email or web address].com/[email or web address]">  °Knee Exercises °Ask your health care provider which exercises are safe for you. Do exercises exactly as told by your health care provider and adjust them as directed. It is normal to feel mild stretching, pulling, tightness, or discomfort as you do these exercises. Stop right away if you feel sudden pain or your pain gets worse. Do not begin these exercises until told by your health care provider. °Stretching and range-of-motion exercises °These exercises warm up your muscles and joints and improve the movement and flexibility of your knee. These exercises also help to relieve pain and swelling. °Knee extension, prone °1. Lie on your abdomen (prone position) on a bed. °2. Place your left / right knee just beyond the edge of the surface so your knee is not on the bed. You can put a towel under your left / right thigh just above your kneecap for comfort. °3. Relax your leg muscles and allow gravity to straighten your knee (extension). You should feel a stretch behind your left / right knee. °4. Hold this position for __________ seconds. °5. Scoot up so your knee is supported between repetitions. °Repeat __________ times. Complete this exercise __________ times a day. °Knee flexion, active ° °1. Lie on your back with both legs straight. If this causes back discomfort, bend your left / right knee so your foot is flat on the floor. °2. Slowly slide your left / right heel back toward your buttocks. Stop when you feel a gentle stretch in the front of your knee or thigh (flexion). °3. Hold this position for __________ seconds. °4. Slowly slide your left / right heel back to the starting position. °Repeat __________ times. Complete this exercise __________ times a day. °Quadriceps stretch, prone ° °1. Lie on your abdomen on a firm surface, such as a bed or padded floor. °2. Bend your left / right knee and hold  your ankle. If you cannot reach your ankle or pant leg, loop a belt around your foot and grab the belt instead. °3. Gently pull your heel toward your buttocks. Your knee should not slide out to the side. You should feel a stretch in the front of your thigh and knee (quadriceps). °4. Hold this position for __________ seconds. °Repeat __________ times. Complete this exercise __________ times a day. °Hamstring, supine °1. Lie on your back (supine position). °2. Loop a belt or towel over the ball of your left / right foot. The ball of your foot is on the walking surface, right under your toes. °3. Straighten your left / right knee and slowly pull on the belt to raise your leg until you feel a gentle stretch behind your knee (hamstring). °? Do not let your knee bend while you do this. °? Keep your other leg flat on the floor. °4. Hold this position for __________ seconds. °Repeat __________ times. Complete this exercise __________ times a day. °Strengthening exercises °These exercises build strength and endurance in your knee. Endurance is the ability to use your muscles for a long time, even after they get tired. °Quadriceps, isometric °This exercise stretches the muscles in front of your thigh (quadriceps) without moving your knee joint (isometric). °1. Lie on your back with your left / right leg extended and your other knee bent. Put a rolled towel or small pillow under your knee if told by your health care provider. °2. Slowly tense the muscles in the front of your left /   right thigh. You should see your kneecap slide up toward your hip or see increased dimpling just above the knee. This motion will push the back of the knee toward the floor. °3. For __________ seconds, hold the muscle as tight as you can without increasing your pain. °4. Relax the muscles slowly and completely. °Repeat __________ times. Complete this exercise __________ times a day. °Straight leg raises °This exercise stretches the muscles in front  of your thigh (quadriceps) and the muscles that move your hips (hip flexors). °1. Lie on your back with your left / right leg extended and your other knee bent. °2. Tense the muscles in the front of your left / right thigh. You should see your kneecap slide up or see increased dimpling just above the knee. Your thigh may even shake a bit. °3. Keep these muscles tight as you raise your leg 4-6 inches (10-15 cm) off the floor. Do not let your knee bend. °4. Hold this position for __________ seconds. °5. Keep these muscles tense as you lower your leg. °6. Relax your muscles slowly and completely after each repetition. °Repeat __________ times. Complete this exercise __________ times a day. °Hamstring, isometric °1. Lie on your back on a firm surface. °2. Bend your left / right knee about __________ degrees. °3. Dig your left / right heel into the surface as if you are trying to pull it toward your buttocks. Tighten the muscles in the back of your thighs (hamstring) to "dig" as hard as you can without increasing any pain. °4. Hold this position for __________ seconds. °5. Release the tension gradually and allow your muscles to relax completely for __________ seconds after each repetition. °Repeat __________ times. Complete this exercise __________ times a day. °Hamstring curls °If told by your health care provider, do this exercise while wearing ankle weights. Begin with __________ lb weights. Then increase the weight by 1 lb (0.5 kg) increments. Do not wear ankle weights that are more than __________ lb. °1. Lie on your abdomen with your legs straight. °2. Bend your left / right knee as far as you can without feeling pain. Keep your hips flat against the floor. °3. Hold this position for __________ seconds. °4. Slowly lower your leg to the starting position. °Repeat __________ times. Complete this exercise __________ times a day. °Squats °This exercise strengthens the muscles in front of your thigh and knee  (quadriceps). °1. Stand in front of a table, with your feet and knees pointing straight ahead. You may rest your hands on the table for balance but not for support. °2. Slowly bend your knees and lower your hips like you are going to sit in a chair. °? Keep your weight over your heels, not over your toes. °? Keep your lower legs upright so they are parallel with the table legs. °? Do not let your hips go lower than your knees. °? Do not bend lower than told by your health care provider. °? If your knee pain increases, do not bend as low. °3. Hold the squat position for __________ seconds. °4. Slowly push with your legs to return to standing. Do not use your hands to pull yourself to standing. °Repeat __________ times. Complete this exercise __________ times a day. °Wall slides °This exercise strengthens the muscles in front of your thigh and knee (quadriceps). °1. Lean your back against a smooth wall or door, and walk your feet out 18-24 inches (46-61 cm) from it. °2. Place your feet hip-width apart. °3.   Slowly slide down the wall or door until your knees bend __________ degrees. Keep your knees over your heels, not over your toes. Keep your knees in line with your hips. 4. Hold this position for __________ seconds. Repeat __________ times. Complete this exercise __________ times a day. Straight leg raises This exercise strengthens the muscles that rotate the leg at the hip and move it away from your body (hip abductors). 1. Lie on your side with your left / right leg in the top position. Lie so your head, shoulder, knee, and hip line up. You may bend your bottom knee to help you keep your balance. 2. Roll your hips slightly forward so your hips are stacked directly over each other and your left / right knee is facing forward. 3. Leading with your heel, lift your top leg 4-6 inches (10-15 cm). You should feel the muscles in your outer hip lifting. ? Do not let your foot drift forward. ? Do not let your knee  roll toward the ceiling. 4. Hold this position for __________ seconds. 5. Slowly return your leg to the starting position. 6. Let your muscles relax completely after each repetition. Repeat __________ times. Complete this exercise __________ times a day. Straight leg raises This exercise stretches the muscles that move your hips away from the front of the pelvis (hip extensors). 1. Lie on your abdomen on a firm surface. You can put a pillow under your hips if that is more comfortable. 2. Tense the muscles in your buttocks and lift your left / right leg about 4-6 inches (10-15 cm). Keep your knee straight as you lift your leg. 3. Hold this position for __________ seconds. 4. Slowly lower your leg to the starting position. 5. Let your leg relax completely after each repetition. Repeat __________ times. Complete this exercise __________ times a day. This information is not intended to replace advice given to you by your health care provider. Make sure you discuss any questions you have with your health care provider. Document Revised: 10/07/2018 Document Reviewed: 10/07/2018 Elsevier Patient Education  [place]. Cervical Strain and Sprain Rehab Ask your health care provider which exercises are safe for you. Do exercises exactly as told by your health care provider and adjust them as directed. It is normal to feel mild stretching, pulling, tightness, or discomfort as you do these exercises. Stop right away if you feel sudden pain or your pain gets worse. Do not begin these exercises until told by your health care provider. Stretching and range-of-motion exercises Cervical side bending  6. Using good posture, sit on a stable chair or stand up. 7. Without moving your shoulders, slowly tilt your left / right ear to your shoulder until you feel a stretch in the opposite side neck muscles. You should be looking straight ahead. 8. Hold for __________ seconds. 9. Repeat with the other side of  your neck. Repeat __________ times. Complete this exercise __________ times a day. Cervical rotation  5. Using good posture, sit on a stable chair or stand up. 6. Slowly turn your head to the side as if you are looking over your left / right shoulder. ? Keep your eyes level with the ground. ? Stop when you feel a stretch along the side and the back of your neck. 7. Hold for __________ seconds. 8. Repeat this by turning to your other side. Repeat __________ times. Complete this exercise __________ times a day. Thoracic extension and pectoral stretch 5. Roll a towel or a  small blanket so it is about 4 inches (10 cm) in diameter. 6. Lie down on your back on a firm surface. 7. Put the towel lengthwise, under your spine in the middle of your back. It should not be under your shoulder blades. The towel should line up with your spine from your middle back to your lower back. 8. Put your hands behind your head and let your elbows fall out to your sides. 9. Hold for __________ seconds. Repeat __________ times. Complete this exercise __________ times a day. Strengthening exercises Isometric upper cervical flexion 5. Lie on your back with a thin pillow behind your head and a small rolled-up towel under your neck. 6. Gently tuck your chin toward your chest and nod your head down to look toward your feet. Do not lift your head off the pillow. 7. Hold for __________ seconds. 8. Release the tension slowly. Relax your neck muscles completely before you repeat this exercise. Repeat __________ times. Complete this exercise __________ times a day. Isometric cervical extension  5. Stand about 6 inches (15 cm) away from a wall, with your back facing the wall. 6. Place a soft object, about 6-8 inches (15-20 cm) in diameter, between the back of your head and the wall. A soft object could be a small pillow, a ball, or a folded towel. 7. Gently tilt your head back and press into the soft object. Keep your jaw  and forehead relaxed. 8. Hold for __________ seconds. 9. Release the tension slowly. Relax your neck muscles completely before you repeat this exercise. Repeat __________ times. Complete this exercise __________ times a day. Posture and body mechanics Body mechanics refers to the movements and positions of your body while you do your daily activities. Posture is part of body mechanics. Good posture and healthy body mechanics can help to relieve stress in your body's tissues and joints. Good posture means that your spine is in its natural S-curve position (your spine is neutral), your shoulders are pulled back slightly, and your head is not tipped forward. The following are general guidelines for applying improved posture and body mechanics to your everyday activities. Sitting  7. When sitting, keep your spine neutral and keep your feet flat on the floor. Use a footrest, if necessary, and keep your thighs parallel to the floor. Avoid rounding your shoulders, and avoid tilting your head forward. 8. When working at a desk or a computer, keep your desk at a height where your hands are slightly lower than your elbows. Slide your chair under your desk so you are close enough to maintain good posture. 9. When working at a computer, place your monitor at a height where you are looking straight ahead and you do not have to tilt your head forward or downward to look at the screen. Standing   When standing, keep your spine neutral and keep your feet about hip-width apart. Keep a slight bend in your knees. Your ears, shoulders, and hips should line up.  When you do a task in which you stand in one place for a long time, place one foot up on a stable object that is 2-4 inches (5-10 cm) high, such as a footstool. This helps keep your spine neutral. Resting When lying down and resting, avoid positions that are most painful for you. Try to support your neck in a neutral position. You can use a contour pillow or a  small rolled-up towel. Your pillow should support your neck but not push on it.  This information is not intended to replace advice given to you by your health care provider. Make sure you discuss any questions you have with your health care provider. Document Revised: 04/08/2019 Document Reviewed: 09/17/2018 Elsevier Patient Education  [place]. Hand Exercises Hand exercises can be helpful for almost anyone. These exercises can strengthen the hands, improve flexibility and movement, and increase blood flow to the hands. These results can make work and daily tasks easier. Hand exercises can be especially helpful for people who have joint pain from arthritis or have nerve damage from overuse (carpal tunnel syndrome). These exercises can also help people who have injured a hand. Exercises Most of these hand exercises are gentle stretching and motion exercises. It is usually safe to do them often throughout the day. Warming up your hands before exercise may help to reduce stiffness. You can do this with gentle massage or by placing your hands in warm water for 10-15 minutes. It is normal to feel some stretching, pulling, tightness, or mild discomfort as you begin new exercises. This will gradually improve. Stop an exercise right away if you feel sudden, severe pain or your pain gets worse. Ask your health care provider which exercises are best for you. Knuckle bend or "claw" fist 1. Stand or sit with your arm, hand, and all five fingers pointed straight up. Make sure to keep your wrist straight during the exercise. 2. Gently bend your fingers down toward your palm until the tips of your fingers are touching the top of your palm. Keep your big knuckle straight and just bend the small knuckles in your fingers. 3. Hold this position for __________ seconds. 4. Straighten (extend) your fingers back to the starting position. Repeat this exercise 5-10 times with each hand. Full finger fist 1. Stand or  sit with your arm, hand, and all five fingers pointed straight up. Make sure to keep your wrist straight during the exercise. 2. Gently bend your fingers into your palm until the tips of your fingers are touching the middle of your palm. 3. Hold this position for __________ seconds. 4. Extend your fingers back to the starting position, stretching every joint fully. Repeat this exercise 5-10 times with each hand. Straight fist 1. Stand or sit with your arm, hand, and all five fingers pointed straight up. Make sure to keep your wrist straight during the exercise. 2. Gently bend your fingers at the big knuckle, where your fingers meet your hand, and the middle knuckle. Keep the knuckle at the tips of your fingers straight and try to touch the bottom of your palm. 3. Hold this position for __________ seconds. 4. Extend your fingers back to the starting position, stretching every joint fully. Repeat this exercise 5-10 times with each hand. Tabletop 1. Stand or sit with your arm, hand, and all five fingers pointed straight up. Make sure to keep your wrist straight during the exercise. 2. Gently bend your fingers at the big knuckle, where your fingers meet your hand, as far down as you can while keeping the small knuckles in your fingers straight. Think of forming a tabletop with your fingers. 3. Hold this position for __________ seconds. 4. Extend your fingers back to the starting position, stretching every joint fully. Repeat this exercise 5-10 times with each hand. Finger spread 1. Place your hand flat on a table with your palm facing down. Make sure your wrist stays straight as you do this exercise. 2. Spread your fingers and thumb apart from  each other as far as you can until you feel a gentle stretch. Hold this position for __________ seconds. 3. Bring your fingers and thumb tight together again. Hold this position for __________ seconds. Repeat this exercise 5-10 times with each hand. Making  circles 1. Stand or sit with your arm, hand, and all five fingers pointed straight up. Make sure to keep your wrist straight during the exercise. 2. Make a circle by touching the tip of your thumb to the tip of your index finger. 3. Hold for __________ seconds. Then open your hand wide. 4. Repeat this motion with your thumb and each finger on your hand. Repeat this exercise 5-10 times with each hand. Thumb motion 1. Sit with your forearm resting on a table and your wrist straight. Your thumb should be facing up toward the ceiling. Keep your fingers relaxed as you move your thumb. 2. Lift your thumb up as high as you can toward the ceiling. Hold for __________ seconds. 3. Bend your thumb across your palm as far as you can, reaching the tip of your thumb for the small finger (pinkie) side of your palm. Hold for __________ seconds. Repeat this exercise 5-10 times with each hand. Grip strengthening  1. Hold a stress ball or other soft ball in the middle of your hand. 2. Slowly increase the pressure, squeezing the ball as much as you can without causing pain. Think of bringing the tips of your fingers into the middle of your palm. All of your finger joints should bend when doing this exercise. 3. Hold your squeeze for __________ seconds, then relax. Repeat this exercise 5-10 times with each hand. Contact a health care provider if:  Your hand pain or discomfort gets much worse when you do an exercise.  Your hand pain or discomfort does not improve within 2 hours after you exercise. If you have any of these problems, stop doing these exercises right away. Do not do them again unless your health care provider says that you can. Get help right away if:  You develop sudden, severe hand pain or swelling. If this happens, stop doing these exercises right away. Do not do them again unless your health care provider says that you can. This information is not intended to replace advice given to you by your  health care provider. Make sure you discuss any questions you have with your health care provider. Document Revised: 04/09/2019 Document Reviewed: 12/18/2018 Elsevier Patient Education  [place].

## 2020-12-18 NOTE — Progress Notes (Signed)
Sed rate is normal

## 2020-12-20 MED FILL — MONTELUKAST SOD 10 MG TAB: 10 | 60 days supply | Qty: 60 | Fill #3

## 2020-12-21 DIAGNOSIS — F411 Generalized anxiety disorder: Secondary | ICD-10-CM | POA: Diagnosis not present

## 2020-12-21 DIAGNOSIS — F330 Major depressive disorder, recurrent, mild: Secondary | ICD-10-CM | POA: Diagnosis not present

## 2021-01-02 MED FILL — traZODone HCL 50 MG TABS: 50 | 59 days supply | Qty: 59 | Fill #1

## 2021-01-02 MED FILL — NURTEC 75 MG TBDP: 75 | 30 days supply | Qty: 8 | Fill #4

## 2021-01-04 ENCOUNTER — Other Ambulatory Visit: Payer: Self-pay

## 2021-01-04 ENCOUNTER — Ambulatory Visit
Admission: RE | Admit: 2021-01-04 | Discharge: 2021-01-04 | Disposition: A | Discharge status: Home / Self Care | Payer: 59 | Source: Ambulatory Visit | Attending: Family Medicine | Admitting: Family Medicine

## 2021-01-04 DIAGNOSIS — Z1231 Encounter for screening mammogram for malignant neoplasm of breast: Secondary | ICD-10-CM | POA: Diagnosis not present

## 2021-01-05 ENCOUNTER — Other Ambulatory Visit: Payer: Self-pay | Admitting: Family Medicine

## 2021-01-05 ENCOUNTER — Telehealth: Payer: Self-pay | Admitting: Family Medicine

## 2021-01-05 ENCOUNTER — Telehealth: Payer: Self-pay | Admitting: Pharmacist

## 2021-01-05 DIAGNOSIS — M7918 Myalgia, other site: Secondary | ICD-10-CM

## 2021-01-05 DIAGNOSIS — F988 Other specified behavioral and emotional disorders with onset usually occurring in childhood and adolescence: Secondary | ICD-10-CM

## 2021-01-05 DIAGNOSIS — L409 Psoriasis, unspecified: Secondary | ICD-10-CM

## 2021-01-05 MED ORDER — APREMILAST 10 & 20 & 30 MG PO TBPK
ORAL_TABLET | ORAL | 0 refills | Status: DC
Start: 2021-01-05 — End: 2021-01-16

## 2021-01-05 MED ORDER — TRAMADOL HCL 50 MG PO TABS: 100.0000 mg | ORAL_TABLET | Freq: Two times a day (BID) | ORAL | 0 refills | Status: DC

## 2021-01-05 MED ORDER — LISDEXAMFETAMINE DIMESYLATE 50 MG PO CAPS
50.0000 mg | ORAL_CAPSULE | Freq: Every day | ORAL | 0 refills | Status: DC
Start: 2021-01-05 — End: 2021-01-25

## 2021-01-05 NOTE — Telephone Encounter (Signed)
Called patient to schedule an appointment for the [facility]. I was unable to reach the patient so I left a HIPAA-compliant message requesting that the patient return my call.   

## 2021-01-05 NOTE — Telephone Encounter (Signed)
Patient notified VIA phone.  No questions.  Dm/cma ? ?

## 2021-01-05 NOTE — Telephone Encounter (Signed)
Spoke to patient and she hasn't taken the [place] before.  But with her psoriasis is flaring up and would like to try it.  She is willing to make an appt if needed.   Dm/cma

## 2021-01-05 NOTE — Telephone Encounter (Signed)
Is patient requesting 30mg  dose of otezla or the starter/titration pack? Is this something she is currently on, as it is not on active med list?

## 2021-01-05 NOTE — Telephone Encounter (Signed)
Rx sent for start pack as pt needs to titrate to maintenance dose of 30mg  BID

## 2021-01-05 NOTE — Telephone Encounter (Signed)
Refill request for: Vyvance 50 mg LR 10/12/20. #90, 0 rfs  Tramadol 50 mg LR 10/12/20, #360, 0 rfs LOV 10/19/20 FOV 01/25/21  Also wants RX for the [facility] for psoriasis.   Please review and advise.   Thanks. Dm/cma

## 2021-01-05 NOTE — Telephone Encounter (Signed)
Caller Name: Myrle Call back phone #: [phone]  MEDICATION(S): Vyvanse & Tramadol - has 1 wk left but knows Dr. Salena Saner is out Monday and Tuesday  Pt asking if she can get RX for [facility] for psoriasis  Preferred Pharmacy:[facility]

## 2021-01-11 ENCOUNTER — Ambulatory Visit: Payer: 59 | Admitting: Dermatology

## 2021-01-16 ENCOUNTER — Ambulatory Visit (HOSPITAL_BASED_OUTPATIENT_CLINIC_OR_DEPARTMENT_OTHER): Payer: 59 | Admitting: Pharmacist

## 2021-01-16 ENCOUNTER — Other Ambulatory Visit: Payer: Self-pay | Admitting: Internal Medicine

## 2021-01-16 ENCOUNTER — Other Ambulatory Visit: Payer: Self-pay

## 2021-01-16 DIAGNOSIS — L409 Psoriasis, unspecified: Secondary | ICD-10-CM

## 2021-01-16 DIAGNOSIS — Z79899 Other long term (current) drug therapy: Secondary | ICD-10-CM

## 2021-01-16 MED ORDER — APREMILAST 10 & 20 & 30 MG PO TBPK: ORAL_TABLET | ORAL | 0 refills | Status: DC

## 2021-01-16 MED FILL — VYVANSE 50 MG CAPSULE: 50 | 90 days supply | Qty: 90 | Fill #0

## 2021-01-16 MED FILL — ATENOLOL 50 MG TABLET: 50 | 90 days supply | Qty: 180 | Fill #1

## 2021-01-16 MED FILL — traMADol HCL 50 MG TABS: 50 | 90 days supply | Qty: 360 | Fill #0

## 2021-01-16 NOTE — Progress Notes (Signed)
  S: Patient presents for review of their specialty medication therapy.  Patient is currently taking Otezla for psoriasis. Patient is managed by Dr. Bryan Lemma for this.   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 Titrate upward by additional 10 mg per day on days 2 to 5 as follows: Day 2: 10 mg twice daily; Day 3: 10 mg in the morning and 20 mg in the evening; Day 4: 20 mg twice daily; Day 5: 20 mg in the morning and 30 mg in the evening. Maintenance dose: 30 mg twice daily starting on day 6  CrCl <30 mL/minute: Initial: 10 mg in the morning on days 1 to 3; titrate using morning doses only (skip evening doses) to 20 mg on days 4 and 5. Maintenance dose: 30 mg once daily in the morning starting on day 6.    Current adverse effects: Headache: has not yet started; counseling given GI upset: has not yet started; counseling given Weight loss: has not yet started; counseling given Neuropsychiatric effects: has not yet started; counseling given  O:     Lab Results  Component Value Date   WBC 9.4 02/05/2020   HGB 12.3 02/05/2020   HCT 37.1 02/05/2020   MCV 86.7 02/05/2020   PLT 270 02/05/2020      Chemistry      Component Value Date/Time   NA 141 04/21/2020 0733   K 3.9 04/21/2020 0733   CL 103 04/21/2020 0733   CO2 30 04/21/2020 0733   BUN 10 04/21/2020 0733   CREATININE 0.70 04/21/2020 0733      Component Value Date/Time   CALCIUM 9.2 04/21/2020 0733   ALKPHOS 81 11/16/2019 2235   AST 16 10/19/2020 1444   ALT 11 10/19/2020 1444   BILITOT 0.9 11/16/2019 2235       A/P: 1. Medication review: patient is currently prescribed Otezla for psoriasis. Reviewed the medication including the following: apremilast inhibits phosphodiesterase 4 (PDE4) specific for cyclic adenosine monophosphate (cAMP) which results in increased intracellular cAMP levels  and regulation of numerous inflammatory mediators (eg, decreased expression of nitric oxide synthase, TNF-alpha, and interleukin [IL]-23, as well as increased IL-10. Patient educated on purpose, proper use and potential adverse effects of Otezla. Possible adverse effects include weight loss, GI upset, headache, and mood changes. Renal function should be routinely monitored. Administer without regard to food. Do not crush, chew, or split tablets. No recommendations for any changes at this time.   Benard Halsted, PharmD, [place], [place] [phone]

## 2021-01-22 NOTE — Progress Notes (Addendum)
Office Visit Note  Patient: Barbara Wood             Date of Birth: 27-Apr-1970           MRN: [id number]             PCP: Ronnald Nian, DO Referring: Ronnald Nian, DO Visit Date: 02/01/2021 Occupation: @GUAROCC @  Subjective:  Follow-up (Bil hand pain and swelling)   History of Present Illness: Alessandra Sawdey is a 51 y.o. female with history of psoriasis and osteoarthritis.  She states she has been having intermittent swelling in her hands.  Today she presented with some inflammation in her left hand.  She is left-handed and and is on computer all day seeing patients.  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  She continues to have some trochanteric bursitis.  None of the other joints are painful today.  She states she was seen by her PCP recently and will be starting [facility] for ongoing severe psoriasis.  Activities of Daily Living:  Patient reports morning stiffness for 1.5 hours.   Patient Reports nocturnal pain.  Difficulty dressing/grooming: Denies Difficulty climbing stairs: Denies Difficulty getting out of chair: Denies Difficulty using hands for taps, buttons, cutlery, and/or writing: Reports  Review of Systems  Constitutional: Positive for fatigue.  HENT: Positive for mouth dryness.   Eyes: Negative for dryness.  Respiratory: Negative for shortness of breath.   Cardiovascular: Negative for swelling in legs/feet.  Gastrointestinal: Positive for constipation and diarrhea.  Endocrine: Positive for cold intolerance and heat intolerance.  Genitourinary: Negative for difficulty urinating.  Musculoskeletal: Positive for arthralgias, joint pain, joint swelling and morning stiffness.  Skin: Positive for redness.  Allergic/Immunologic: Positive for susceptible to infections.  Neurological: Negative for numbness.  Hematological: Negative for bruising/bleeding tendency.  Psychiatric/Behavioral: Negative for sleep disturbance.     PMFS History:  Patient Active Problem List   Diagnosis Date Noted  . Changing skin lesion 07/19/2020  . 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 Asthma 07/29/2019  . Hashimoto's disease 07/29/2019  . Myofascial pain 07/29/2019  . Neck pain 07/29/2019  . Psoriasis 07/29/2019  . Migraine 07/26/1986    Past Medical History:  Diagnosis Date  . Allergy Age 56  . Asthma Since age 23  . Depression Age 89  . Hashimoto's disease   . Heart murmur Mitral valve prolapse age 20  . Migraine   . Psoriasis   . Thyroid disease Since age 53    Family History  Problem Relation Age of Onset  . Asthma Sister   . Miscarriages / Stillbirths Sister   . Depression Sister   . Psoriasis Sister   . Cancer Mother   . Depression Mother   . Hashimoto's thyroiditis Mother   . Diabetes Mother   . Bipolar disorder Mother   . Hypertension Father   . Ankylosing spondylitis Father   . Psoriasis Father   . Psoriasis Brother   . Migraines Brother   . ADD / ADHD Brother   . Eczema Brother   . Allergies Brother   . Psoriasis Maternal Aunt   . Psoriasis Sister   . Arthritis Sister        psoriatic arthritis   . Psoriasis Sister   . Rheum arthritis Sister   . Depression Sister   . Anxiety disorder Sister   . Eczema Sister   . Psoriasis Brother   . ADD / ADHD Daughter   .  Eczema Daughter   . Asthma Daughter    Past Surgical History:  Procedure Laterality Date  . BREAST SURGERY  Reduction age 87  . CHOLECYSTECTOMY  Age 5  . KNEE ARTHROSCOPY W/ LATERAL RELEASE Right   . WISDOM TOOTH EXTRACTION     Social History   Social History Narrative  . Not on file   Immunization History  Administered Date(s) Administered  . Influenza,inj,Quad PF,6+ Mos 09/30/2019, 09/22/2020  . Influenza-Unspecified 11/02/2013  . PFIZER(Purple Top)SARS-COV-2 Vaccination 12/29/2019, 01/26/2020, 11/28/2020     Objective: Vital Signs: BP 113/70 (BP Location: Left Arm, Patient Position: Sitting, Cuff Size: Normal)    Pulse 89   Resp 15   Ht 5' 7"  (1.702 m)   Wt 210 lb (95.3 kg)   BMI 32.89 kg/m    Physical Exam Vitals and nursing note reviewed.  Constitutional:      Appearance: She is well-developed and well-nourished.  HENT:     Head: Normocephalic and atraumatic.  Eyes:     Extraocular Movements: EOM normal.     Conjunctiva/sclera: Conjunctivae normal.  Cardiovascular:     Rate and Rhythm: Normal rate and regular rhythm.     Pulses: Intact distal pulses.     Heart sounds: Normal heart sounds.  Pulmonary:     Effort: Pulmonary effort is normal.     Breath sounds: Normal breath sounds.  Abdominal:     General: Bowel sounds are normal.     Palpations: Abdomen is soft.  Musculoskeletal:     Cervical back: Normal range of motion.  Lymphadenopathy:     Cervical: No cervical adenopathy.  Skin:    General: Skin is warm and dry.     Capillary Refill: Capillary refill takes less than 2 seconds.  Neurological:     Mental Status: She is alert and oriented to person, place, and time.  Psychiatric:        Mood and Affect: Mood and affect normal.        Behavior: Behavior normal.      Musculoskeletal Exam: C-spine was in good range of motion.  Shoulder joints, elbow joints, wrist joints, MCPs PIPs and DIPs with good range of motion.  She had tendinitis of left extensor indicis.  No synovitis was noted over MCPs PIPs or DIPs.  Hip joints, knee joints were in good range of motion.  She had no tenderness over ankles or MTPs.  CDAI Exam: CDAI Score: - Patient Global: -; Provider Global: - Swollen: -; Tender: - Joint Exam 02/01/2021   No joint exam has been documented for this visit   There is currently no information documented on the homunculus. Go to the Rheumatology activity and complete the homunculus joint exam.  Investigation: No additional findings.  Imaging: [place] COMPLETE JOINT SPACE STRUCTURES UP BILAT  Result Date: 02/01/2021 Ultrasound examination of the bilateral hands was  performed per EULAR recommendations. Using 15 MHz transducer, grayscale and power Doppler bilateral second, third, and fifth MCP joints, bilateral PIPs and DIPs of second, third and fifth digits and bilateral wrist joints both dorsal and volar aspects were evaluated to look for synovitis or tenosynovitis. The findings were there was no synovitis on ultrasound examination.  Left extensor indices tendon showed  tenosynovitis.  Right median nerve was 0.09 cm squares which was within normal limits and left median nerve was 0.10 cm squares which was within normal limits. Impression: Ultrasound examination showed left extensor indicis tendinitis.  Bilateral median nerves within normal limits.  MM 3D SCREEN  BREAST BILATERAL  Result Date: 01/04/2021 CLINICAL DATA:  Screening. EXAM: DIGITAL SCREENING BILATERAL MAMMOGRAM WITH TOMO AND CAD COMPARISON:  Previous exam(s). ACR Breast Density Category a: The breast tissue is almost entirely fatty. FINDINGS: There are no findings suspicious for malignancy. Images were processed with CAD. IMPRESSION: No mammographic evidence of malignancy. A result letter of this screening mammogram will be mailed directly to the patient. RECOMMENDATION: Screening mammogram in one year. (Code:SM-B-01Y) BI-RADS CATEGORY  1: Negative. Electronically Signed   By: Audie Pinto M.D.   On: 01/04/2021 14:15    Recent Labs: Lab Results  Component Value Date   WBC 9.4 02/05/2020   HGB 12.3 02/05/2020   PLT 270 02/05/2020   NA 141 04/21/2020   K 3.9 04/21/2020   CL 103 04/21/2020   CO2 30 04/21/2020   GLUCOSE 101 (H) 04/21/2020   BUN 10 04/21/2020   CREATININE 0.70 04/21/2020   BILITOT 0.9 11/16/2019   ALKPHOS 81 11/16/2019   AST 16 10/19/2020   ALT 11 10/19/2020   PROT 7.1 11/16/2019   ALBUMIN 3.6 11/16/2019   CALCIUM 9.2 04/21/2020   GFRAA >60 11/16/2019   07/14/20: ANA negative, CRP 1.3, ESR 20, anti-CCP:16, RF<14   Speciality Comments: No specialty comments  available.  Procedures:  No procedures performed Allergies: Amoxicillin-pot clavulanate, Sertraline, Sulfamethoxazole-trimethoprim, and Triptans   Assessment / Plan:     Visit Diagnoses: Intermittent pain and swelling of hand - all autoimmune labs were negative.  X-rays were consistent with osteoarthritis.  She is left-handed.  Left extensor tendon tendinitis was noted on the physical examination.  Ultrasound of bilateral hands was obtained today. - Plan: [place] COMPLETE JOINT SPACE STRUCTURES UP BILAT.  No synovitis was noted in the MCPs, PIPs or DIPs.  Wrist joint did not show any synovitis.  She had tenosynovitis of the left extensor indices tendon.  Use of topical diclofenac gel was discussed.  She will be starting [place] for psoriasis.  I believe the tenosynovitis may improve it is related to psoriasis.  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  She also does  glass work.  Which could be strenuous on her left hand.  She will be returning on as needed basis  Chronic pain of both knees - X-ray of left knee joint showed mild chondromalacia patella.  History of right knee joint lateral release as a teenager.  DDD (degenerative disc disease), cervical - C5-C6 narrowing and anterior osteophytes are noted.  She has some chronic discomfort in her cervical spine.  She will continue with the C-spine exercises.  Chronic SI joint pain - X-ray was unremarkable.  Trochanteric bursitis of both hips-she has off-and-on discomfort in bilateral trochanteric bursa.  Doing stretching exercises will be helpful.  Pes cavus - Bilateral pes cavus and callus formation was noted.  History of plantar fasciitis.  She has been trying to wear shoes with good arch support.  Plantar neuroma, right-history of intermittent discomfort.  Psoriasis - History of psoriasis on his scalp and on lower extremities.  Followed by Dr. April Manson.  She is planning to start [facility] for psoriasis.  Raynaud's disease without  gangrene-mildly active.  Myofascial pain - She has generalized arthralgias, history of recurrent epicondylitis, bicipital tendinitis, trochanteric bursitis and plantar fasciitis.  Other fatigue  Primary insomnia  History of asthma  Hx of migraines  Hashimoto's disease  History of depression  Attention deficit hyperactivity disorder (ADHD), combined type  Gastroesophageal reflux disease without esophagitis  Family history of psoriatic  arthritis - Her sister  Family history of ankylosing spondylitis - Father  Family history of eosinophilic esophagitis - Brother and sister  Orders: Orders Placed This Encounter  Procedures  . [place] COMPLETE JOINT SPACE STRUCTURES UP BILAT   No orders of the defined types were placed in this encounter.    Follow-Up Instructions: Return if symptoms worsen or fail to improve, for RSS, osteoarthritis, tendinitis.   Bo Merino, MD  Note - This record has been created using Editor, commissioning.  Chart creation errors have been sought, but may not always  have been located. Such creation errors do not reflect on  the standard of medical care.

## 2021-01-25 ENCOUNTER — Other Ambulatory Visit: Payer: Self-pay

## 2021-01-25 ENCOUNTER — Encounter: Payer: Self-pay | Admitting: Family Medicine

## 2021-01-25 ENCOUNTER — Other Ambulatory Visit: Payer: Self-pay | Admitting: Family Medicine

## 2021-01-25 ENCOUNTER — Ambulatory Visit: Payer: 59 | Admitting: Family Medicine

## 2021-01-25 VITALS — BP 118/84 | HR 86 | Temp 97.5°F | Ht 66.5 in | Wt 210.0 lb

## 2021-01-25 DIAGNOSIS — E559 Vitamin D deficiency, unspecified: Secondary | ICD-10-CM

## 2021-01-25 DIAGNOSIS — F988 Other specified behavioral and emotional disorders with onset usually occurring in childhood and adolescence: Secondary | ICD-10-CM

## 2021-01-25 DIAGNOSIS — R7301 Impaired fasting glucose: Secondary | ICD-10-CM

## 2021-01-25 DIAGNOSIS — M7918 Myalgia, other site: Secondary | ICD-10-CM | POA: Diagnosis not present

## 2021-01-25 DIAGNOSIS — E063 Autoimmune thyroiditis: Secondary | ICD-10-CM

## 2021-01-25 DIAGNOSIS — G43909 Migraine, unspecified, not intractable, without status migrainosus: Secondary | ICD-10-CM | POA: Diagnosis not present

## 2021-01-25 MED ORDER — TRAZODONE HCL 50 MG PO TABS
75.0000 mg | ORAL_TABLET | Freq: Every evening | ORAL | 3 refills | Status: DC | PRN
Start: 2021-01-25 — End: 2021-01-25

## 2021-01-25 MED ORDER — ATENOLOL 25 MG PO TABS
25.0000 mg | ORAL_TABLET | Freq: Two times a day (BID) | ORAL | 1 refills | Status: DC
Start: 2021-01-25 — End: 2021-01-25

## 2021-01-25 MED ORDER — TRAMADOL HCL 50 MG PO TABS
100.0000 mg | ORAL_TABLET | Freq: Two times a day (BID) | ORAL | 0 refills | Status: DC
Start: 2021-01-25 — End: 2021-01-25

## 2021-01-25 MED ORDER — LISDEXAMFETAMINE DIMESYLATE 50 MG PO CAPS
50.0000 mg | ORAL_CAPSULE | Freq: Every day | ORAL | 0 refills | Status: DC
Start: 2021-01-25 — End: 2021-01-25

## 2021-01-25 MED ORDER — NURTEC 75 MG PO TBDP: 75.0000 mg | ORAL_TABLET | Freq: Every day | ORAL | 11 refills | Status: DC | PRN

## 2021-01-25 MED FILL — ATENOLOL 25 MG TABLET: 25 | 90 days supply | Qty: 180 | Fill #0

## 2021-01-25 NOTE — Progress Notes (Signed)
Barbara Wood is a 51 y.o. female  Chief Complaint  Patient presents with  . Follow-up    3 month f/u meds.      HPI: Barbara Wood is a 51 y.o. female seen today for routine f/u on chronic medical issues including ADD, chronic myofascial pain, migraines. She is due for medication refills. She takes vyvnase 50mg  daily for ADD. She takes tramadol 100mg  BID for her chronic myofascial pain.  Migraines overall controlled. She is on emgality and PRN nurtec. She needs refills of these. She is taking Vit D 10,000units daily.   She is also due for f/u A1C. Lab Results  Component Value Date   HGBA1C 6.2 10/19/2020  she has lost 3.5lbs since last OV. She has down on carbs. She is down to 1 8oz can of Dr. Malachi Bonds daily.  She cut out chocolate milk which she used to drink daily in afternoon. She has cut down on sweets about 50%. She has increased protein.   She is due for repeat TFTs, last in 02/2020. She is on synthroid 137mcg daily.  Lab Results  Component Value Date   TSH 0.32 (L) 02/05/2020    Past Medical History:  Diagnosis Date  . Allergy Age 11  . Asthma Since age 70  . Depression Age 77  . Heart murmur Mitral valve prolapse age 82  . Thyroid disease Since age 59    Past Surgical History:  Procedure Laterality Date  . BREAST SURGERY  Reduction age 38  . CHOLECYSTECTOMY  Age 56  . KNEE ARTHROSCOPY W/ LATERAL RELEASE Right     Social History   Socioeconomic History  . Marital status: Married    Spouse name: Not on file  . Number of children: Not on file  . Years of education: Not on file  . Highest education level: Not on file  Occupational History  . Not on file  Tobacco Use  . Smoking status: Never Smoker  . Smokeless tobacco: Never Used  Vaping Use  . Vaping Use: Never used  Substance and Sexual Activity  . Alcohol use: Yes    Comment: 1-2 drinks per month  . Drug use: Never  . Sexual activity: Yes    Comment: Infertile  Other Topics Concern  . Not on file   Social History Narrative  . Not on file   Social Determinants of Health   Financial Resource Strain: Not on file  Food Insecurity: Not on file  Transportation Needs: Not on file  Physical Activity: Not on file  Stress: Not on file  Social Connections: Not on file  Intimate Partner Violence: Not on file    Family History  Problem Relation Age of Onset  . Asthma Sister   . Miscarriages / Stillbirths Sister   . Depression Sister   . Psoriasis Sister   . Cancer Mother   . Depression Mother   . Hashimoto's thyroiditis Mother   . Diabetes Mother   . Bipolar disorder Mother   . Hypertension Father   . Ankylosing spondylitis Father   . Psoriasis Father   . Psoriasis Brother   . Migraines Brother   . ADD / ADHD Brother   . Eczema Brother   . Allergies Brother   . Psoriasis Maternal Aunt   . Psoriasis Sister   . Arthritis Sister        psoriatic arthritis   . Psoriasis Sister   . Rheum arthritis Sister   . Depression Sister   . Anxiety  disorder Sister   . Eczema Sister   . Psoriasis Brother   . ADD / ADHD Daughter   . Eczema Daughter   . Asthma Daughter      Immunization History  Administered Date(s) Administered  . Influenza,inj,Quad PF,6+ Mos 09/30/2019, 09/22/2020  . Influenza-Unspecified 11/02/2013  . PFIZER(Purple Top)SARS-COV-2 Vaccination 12/29/2019, 01/26/2020, 11/28/2020    Outpatient Encounter Medications as of 01/25/2021  Medication Sig  . albuterol (PROVENTIL HFA) 108 (90 Base) MCG/ACT inhaler Inhale 2 puffs into the lungs every 4 (four) hours as needed for wheezing or shortness of breath.  . Apremilast 10 & 20 & 30 MG TBPK Take as directed  . ARIPiprazole (ABILIFY) 5 MG tablet Take 2 tablets (10 mg total) by mouth daily.  Marland Kitchen atenolol (TENORMIN) 50 MG tablet Take 1 tablet (50 mg total) by mouth 2 (two) times daily.  . betamethasone dipropionate 0.05 % cream Apply topically 2 (two) times daily.  . Budesonide (RHINOCORT ALLERGY NA) Use 1 spray in each  nostril twice daily.  . budesonide-formoterol (SYMBICORT) 160-4.5 MCG/ACT inhaler as needed.  . cetirizine (ZYRTEC) 10 MG tablet Take 1 tablet by mouth 2 (two) times a day.  . diclofenac (VOLTAREN) 75 MG EC tablet as needed.  . Fluocinolone Acetonide Scalp (DERMA-SMOOTHE/FS SCALP) 0.01 % OIL Apply TID  . Galcanezumab-gnlm (EMGALITY) 120 MG/ML SOAJ Inject 1 mL into the skin every 30 (thirty) days.  Marland Kitchen lidocaine (LIDODERM) 5 % Place 1 patch onto the skin daily. Remove & Discard patch within 12 hours or as directed by MD  . liothyronine (CYTOMEL) 5 MCG tablet Take 1 tablet (5 mcg total) by mouth 3 (three) times daily.  Marland Kitchen lisdexamfetamine (VYVANSE) 50 MG capsule Take 1 capsule (50 mg total) by mouth daily.  . metaxalone (SKELAXIN) 800 MG tablet Take 1 tablet (800 mg total) by mouth 3 (three) times daily. (Patient taking differently: Take 800 mg by mouth as needed.)  . montelukast (SINGULAIR) 10 MG tablet TAKE 1 TABLET BY MOUTH EVERYDAY AT BEDTIME  . omeprazole (PRILOSEC) 20 MG capsule Take 1 capsule (20 mg total) by mouth daily.  . promethazine (PHENERGAN) 25 MG tablet Take 1 tablet (25 mg total) by mouth every 6 (six) hours as needed for nausea or vomiting.  . Rimegepant Sulfate ([name]) 75 MG TBDP Take 75 mg by mouth daily as needed.  Marland Kitchen SYNTHROID 125 MCG tablet Take 1 tablet (125 mcg total) by mouth daily before breakfast.  . traMADol (ULTRAM) 50 MG tablet Take 2 tablets (100 mg total) by mouth 2 (two) times daily.  . traZODone (DESYREL) 50 MG tablet TAKE 0.5-1 TABLETS (25-50 MG TOTAL) BY MOUTH AT BEDTIME AS NEEDED FOR SLEEP. (Patient taking differently: Take 75 mg by mouth at bedtime as needed for sleep.)   No facility-administered encounter medications on file as of 01/25/2021.     ROS: Pertinent positives and negatives noted in HPI. Remainder of ROS non-contributory    Allergies  Allergen Reactions  . Amoxicillin-Pot Clavulanate Nausea And Vomiting  . Sertraline Other (See Comments)     hallucinations   . Sulfamethoxazole-Trimethoprim Hives  . Triptans Other (See Comments)    Chest pain     BP 118/84   Pulse 86   Temp (!) 97.5 F (36.4 C) (Temporal)   Ht 5' 6.5" (1.689 m)   Wt 210 lb (95.3 kg)   SpO2 99%   BMI 33.39 kg/m    BP Readings from Last 3 Encounters:  01/25/21 118/84  12/16/20 127/86  10/19/20 120/76  Pulse Readings from Last 3 Encounters:  01/25/21 86  12/16/20 76  10/19/20 76   Wt Readings from Last 3 Encounters:  01/25/21 210 lb (95.3 kg)  12/16/20 212 lb 12.8 oz (96.5 kg)  10/19/20 213 lb 9.6 oz (96.9 kg)     Physical Exam Constitutional:      General: She is not in acute distress.    Appearance: Normal appearance. She is not ill-appearing.  HENT:     Head: Normocephalic and atraumatic.  Cardiovascular:     Rate and Rhythm: Normal rate and regular rhythm.     Heart sounds: Normal heart sounds.  Pulmonary:     Effort: Pulmonary effort is normal. No respiratory distress.     Breath sounds: Normal breath sounds. No wheezing or rhonchi.  Neurological:     Mental Status: She is alert and oriented to person, place, and time.  Psychiatric:        Mood and Affect: Mood normal.        Behavior: Behavior normal.      A/P:  1. Attention deficit disorder, unspecified hyperactivity presence - stable, controlled - database reviewed and appropriate - UDS UTD, controlled substance agreement on file Refill: - lisdexamfetamine (VYVANSE) 50 MG capsule; Take 1 capsule (50 mg total) by mouth daily.  Dispense: 90 capsule; Refill: 0 - f/u in 3 mo or sooner PRN  2. Myofascial pain - stable - cont tramadol 100mg  BID  3. Migraine without status migrainosus, not intractable, unspecified migraine type - controlled, at baseline Refill: - atenolol (TENORMIN) 25 MG tablet; Take 1 tablet (25 mg total) by mouth 2 (two) times daily.  Dispense: 180 tablet; Refill: 1 - Rimegepant Sulfate ([name]) 75 MG TBDP; Take 75 mg by mouth daily as needed.   Dispense: 8 tablet; Refill: 11 - cont emgality  4. IFG (impaired fasting glucose) - Hemoglobin A1c  5. Hashimoto's disease - on synthroid 158mcg daily - TSH - T4, free  6. Vitamin D deficiency - taking 10,000IU daily - VITAMIN D 25 Hydroxy (Vit-D Deficiency, Fractures)   This visit occurred during the SARS-CoV-2 public health emergency.  Safety protocols were in place, including screening questions prior to the visit, additional usage of staff PPE, and extensive cleaning of exam room while observing appropriate contact time as indicated for disinfecting solutions.

## 2021-01-26 ENCOUNTER — Encounter: Payer: Self-pay | Admitting: Family Medicine

## 2021-01-26 LAB — T4, FREE: Free T4: 0.64 ng/dL (ref 0.60–1.60)

## 2021-01-26 LAB — VITAMIN D 25 HYDROXY (VIT D DEFICIENCY, FRACTURES): VITD: 56.51 ng/mL (ref 30.00–100.00)

## 2021-01-26 LAB — TSH: TSH: 0.6 u[IU]/mL (ref 0.35–4.50)

## 2021-01-26 LAB — HEMOGLOBIN A1C: Hgb A1c MFr Bld: 5.9 % (ref 4.6–6.5)

## 2021-01-26 MED FILL — NURTEC 75 MG TBDP: 75 | 8 days supply | Qty: 8 | Fill #0

## 2021-01-30 MED FILL — SYNTHROID 125 MCG TABLET: 125 | 90 days supply | Qty: 90 | Fill #1

## 2021-01-30 MED FILL — LIOTHYRONINE SODIUM 5 MCG T: 5 | 90 days supply | Qty: 270 | Fill #1

## 2021-02-01 ENCOUNTER — Encounter: Payer: Self-pay | Admitting: Rheumatology

## 2021-02-01 ENCOUNTER — Ambulatory Visit: Payer: 59

## 2021-02-01 ENCOUNTER — Ambulatory Visit (INDEPENDENT_AMBULATORY_CARE_PROVIDER_SITE_OTHER): Payer: 59 | Admitting: Rheumatology

## 2021-02-01 ENCOUNTER — Other Ambulatory Visit: Payer: Self-pay

## 2021-02-01 VITALS — BP 113/70 | HR 89 | Resp 15 | Ht 67.0 in | Wt 210.0 lb

## 2021-02-01 DIAGNOSIS — M25562 Pain in left knee: Secondary | ICD-10-CM

## 2021-02-01 DIAGNOSIS — G8929 Other chronic pain: Secondary | ICD-10-CM

## 2021-02-01 DIAGNOSIS — M79643 Pain in unspecified hand: Secondary | ICD-10-CM | POA: Diagnosis not present

## 2021-02-01 DIAGNOSIS — M25561 Pain in right knee: Secondary | ICD-10-CM | POA: Diagnosis not present

## 2021-02-01 DIAGNOSIS — Z8379 Family history of other diseases of the digestive system: Secondary | ICD-10-CM

## 2021-02-01 DIAGNOSIS — M7918 Myalgia, other site: Secondary | ICD-10-CM

## 2021-02-01 DIAGNOSIS — Z8709 Personal history of other diseases of the respiratory system: Secondary | ICD-10-CM

## 2021-02-01 DIAGNOSIS — M79642 Pain in left hand: Secondary | ICD-10-CM | POA: Diagnosis not present

## 2021-02-01 DIAGNOSIS — Z8261 Family history of arthritis: Secondary | ICD-10-CM

## 2021-02-01 DIAGNOSIS — M7061 Trochanteric bursitis, right hip: Secondary | ICD-10-CM | POA: Diagnosis not present

## 2021-02-01 DIAGNOSIS — L409 Psoriasis, unspecified: Secondary | ICD-10-CM | POA: Diagnosis not present

## 2021-02-01 DIAGNOSIS — Z8659 Personal history of other mental and behavioral disorders: Secondary | ICD-10-CM

## 2021-02-01 DIAGNOSIS — M5030 Other cervical disc degeneration, unspecified cervical region: Secondary | ICD-10-CM

## 2021-02-01 DIAGNOSIS — E063 Autoimmune thyroiditis: Secondary | ICD-10-CM

## 2021-02-01 DIAGNOSIS — R5383 Other fatigue: Secondary | ICD-10-CM

## 2021-02-01 DIAGNOSIS — Z8669 Personal history of other diseases of the nervous system and sense organs: Secondary | ICD-10-CM

## 2021-02-01 DIAGNOSIS — M7989 Other specified soft tissue disorders: Secondary | ICD-10-CM

## 2021-02-01 DIAGNOSIS — M7062 Trochanteric bursitis, left hip: Secondary | ICD-10-CM

## 2021-02-01 DIAGNOSIS — G5761 Lesion of plantar nerve, right lower limb: Secondary | ICD-10-CM

## 2021-02-01 DIAGNOSIS — Z840 Family history of diseases of the skin and subcutaneous tissue: Secondary | ICD-10-CM

## 2021-02-01 DIAGNOSIS — M659 Synovitis and tenosynovitis, unspecified: Secondary | ICD-10-CM | POA: Diagnosis not present

## 2021-02-01 DIAGNOSIS — M533 Sacrococcygeal disorders, not elsewhere classified: Secondary | ICD-10-CM | POA: Diagnosis not present

## 2021-02-01 DIAGNOSIS — F5101 Primary insomnia: Secondary | ICD-10-CM

## 2021-02-01 DIAGNOSIS — Q667 Congenital pes cavus, unspecified foot: Secondary | ICD-10-CM | POA: Diagnosis not present

## 2021-02-01 DIAGNOSIS — F902 Attention-deficit hyperactivity disorder, combined type: Secondary | ICD-10-CM

## 2021-02-01 DIAGNOSIS — Z8269 Family history of other diseases of the musculoskeletal system and connective tissue: Secondary | ICD-10-CM

## 2021-02-01 DIAGNOSIS — M79641 Pain in right hand: Secondary | ICD-10-CM

## 2021-02-01 DIAGNOSIS — I7300 Raynaud's syndrome without gangrene: Secondary | ICD-10-CM

## 2021-02-01 DIAGNOSIS — K219 Gastro-esophageal reflux disease without esophagitis: Secondary | ICD-10-CM

## 2021-02-01 MED FILL — NURTEC 75 MG TBDP: 75 | 8 days supply | Qty: 8 | Fill #0

## 2021-02-08 ENCOUNTER — Telehealth: Payer: Self-pay

## 2021-02-08 NOTE — Telephone Encounter (Signed)
PA for [place] filled out through cover my meds.  Awaiting response.  Dm/cma  Key: [id number]

## 2021-02-09 NOTE — Telephone Encounter (Signed)
PA approved from 02/09/21 - 08/08/2021. Pharmacy notified [place] phone. Dm/cma

## 2021-02-13 ENCOUNTER — Other Ambulatory Visit: Payer: Self-pay | Admitting: Family Medicine

## 2021-02-13 MED FILL — traZODone HCL 50 MG TABS: 50 | 90 days supply | Qty: 135 | Fill #0

## 2021-02-13 MED FILL — OTEZLA 10 & 20 & 30 MG TBPK: 10 & 20 & 3 | 30 days supply | Qty: 55 | Fill #0

## 2021-02-13 NOTE — Telephone Encounter (Signed)
Patient is calling to get a PA on her Emgality. She said that she needs this done as soon as possible and it can't wait until Dr. Bryan Lemma comes back on Wednesday. Please call her at [phone] if you have any questions.

## 2021-02-15 ENCOUNTER — Other Ambulatory Visit: Payer: Self-pay | Admitting: Family Medicine

## 2021-02-15 ENCOUNTER — Telehealth: Payer: Self-pay | Admitting: Family Medicine

## 2021-02-15 ENCOUNTER — Telehealth: Payer: Self-pay

## 2021-02-15 MED FILL — MONTELUKAST SOD 10 MG TAB: 10 | 90 days supply | Qty: 90 | Fill #0

## 2021-02-15 NOTE — Telephone Encounter (Signed)
Can you please f/u on emgality PA asap? Thanks

## 2021-02-15 NOTE — Telephone Encounter (Signed)
Pt is due for a refill of her emgality and insurance is requiring PA. This has been done before but is due again. Pt states she has called office x 2 but has not heard back. Please f/u with pt regarding status of PA.

## 2021-02-15 NOTE — Telephone Encounter (Signed)
Patient notified VIA phone that PA was submitted this morning and just waiting to hear back..  It can take 24-72 hours.    She states that she was due to take it on Monday and gets feeling bad when she doesn't take it on time. Dm/cma

## 2021-02-15 NOTE — Telephone Encounter (Signed)
PA was done this morning.  Awaiting response.  Patient aware. Dm/cma

## 2021-02-15 NOTE — Telephone Encounter (Signed)
PA for [facility]  submitted through cover my meds to [place]. Waiting on response. Dm/cma  Key: [id number])

## 2021-02-17 NOTE — Telephone Encounter (Signed)
Received a form asking for additional information from provider.  Filled out and faxed back to [place] @ [phone]. Patient notified [place] phone.   Dm/cma

## 2021-02-20 MED FILL — METAXALONE 800 MG TABS: 800 | 30 days supply | Qty: 90 | Fill #1

## 2021-02-20 MED FILL — EMGALITY 120 MG/ML SOAJ: 120 | 90 days supply | Qty: 3 | Fill #2

## 2021-02-20 NOTE — Telephone Encounter (Signed)
PA was approved for Emgality 120 mg/ml from 02/18/21 - 02/17/22.  Pharmacy notified VIA phone and unable to leave VM for patient due to no mailbox set up.   Dm/cma

## 2021-02-22 ENCOUNTER — Encounter: Payer: Self-pay | Admitting: Family Medicine

## 2021-02-22 ENCOUNTER — Other Ambulatory Visit: Payer: Self-pay | Admitting: Family Medicine

## 2021-02-22 DIAGNOSIS — M7918 Myalgia, other site: Secondary | ICD-10-CM

## 2021-02-22 MED ORDER — DICLOFENAC SODIUM 75 MG PO TBEC
75.0000 mg | DELAYED_RELEASE_TABLET | Freq: Two times a day (BID) | ORAL | 2 refills | Status: DC | PRN
Start: 2021-02-22 — End: 2021-02-22

## 2021-02-22 MED ORDER — METAXALONE 800 MG PO TABS: 800.0000 mg | ORAL_TABLET | Freq: Three times a day (TID) | ORAL | 2 refills | Status: DC | PRN

## 2021-02-22 MED FILL — DICLOFENAC SOD EC 75 MG TAB: 75 | 30 days supply | Qty: 60 | Fill #0

## 2021-02-28 NOTE — Progress Notes (Deleted)
[place] Phone: [phone] Subjective:    I'm seeing this patient by the request  of:  Cirigliano, Garvin Fila, DO  CC:   [id number]  Barbara Wood is a 51 y.o. female coming in with complaint of neck pain. Patient states      Past Medical History:  Diagnosis Date  . Allergy Age 71  . Asthma Since age 79  . Depression Age 50  . Hashimoto's disease   . Heart murmur Mitral valve prolapse age 42  . Migraine   . Psoriasis   . Thyroid disease Since age 50   Past Surgical History:  Procedure Laterality Date  . BREAST SURGERY  Reduction age 70  . CHOLECYSTECTOMY  Age 54  . KNEE ARTHROSCOPY W/ LATERAL RELEASE Right   . WISDOM TOOTH EXTRACTION     Social History   Socioeconomic History  . Marital status: Married    Spouse name: Not on file  . Number of children: Not on file  . Years of education: Not on file  . Highest education level: Not on file  Occupational History  . Not on file  Tobacco Use  . Smoking status: Never Smoker  . Smokeless tobacco: Never Used  Vaping Use  . Vaping Use: Never used  Substance and Sexual Activity  . Alcohol use: Yes    Comment: Rarely  . Drug use: Never  . Sexual activity: Yes    Comment: Infertile  Other Topics Concern  . Not on file  Social History Narrative  . Not on file   Social Determinants of Health   Financial Resource Strain: Not on file  Food Insecurity: Not on file  Transportation Needs: Not on file  Physical Activity: Not on file  Stress: Not on file  Social Connections: Not on file   Allergies  Allergen Reactions  . Amoxicillin-Pot Clavulanate Nausea And Vomiting  . Sertraline Other (See Comments)    hallucinations   . Sulfamethoxazole-Trimethoprim Hives  . Triptans Other (See Comments)    Chest pain    Family History  Problem Relation Age of Onset  . Asthma Sister   . Miscarriages / Stillbirths Sister   . Depression Sister   .  Psoriasis Sister   . Cancer Mother   . Depression Mother   . Hashimoto's thyroiditis Mother   . Diabetes Mother   . Bipolar disorder Mother   . Hypertension Father   . Ankylosing spondylitis Father   . Psoriasis Father   . Psoriasis Brother   . Migraines Brother   . ADD / ADHD Brother   . Eczema Brother   . Allergies Brother   . Psoriasis Maternal Aunt   . Psoriasis Sister   . Arthritis Sister        psoriatic arthritis   . Psoriasis Sister   . Rheum arthritis Sister   . Depression Sister   . Anxiety disorder Sister   . Eczema Sister   . Psoriasis Brother   . ADD / ADHD Daughter   . Eczema Daughter   . Asthma Daughter     Current Outpatient Medications (Endocrine & Metabolic):  .  liothyronine (CYTOMEL) 5 MCG tablet, Take 1 tablet (5 mcg total) by mouth 3 (three) times daily. Marland Kitchen  SYNTHROID 125 MCG tablet, Take 1 tablet (125 mcg total) by mouth daily before breakfast.  Current Outpatient Medications (Cardiovascular):  .  atenolol (TENORMIN) 25 MG tablet, Take 1 tablet (25 mg total)  by mouth 2 (two) times daily.  Current Outpatient Medications (Respiratory):  .  albuterol (PROVENTIL HFA) 108 (90 Base) MCG/ACT inhaler, Inhale 2 puffs into the lungs every 4 (four) hours as needed for wheezing or shortness of breath. .  Budesonide (RHINOCORT ALLERGY NA), Use 1 spray in each nostril twice daily. .  budesonide-formoterol (SYMBICORT) 160-4.5 MCG/ACT inhaler, as needed. .  cetirizine (ZYRTEC) 10 MG tablet, Take 1 tablet by mouth 2 (two) times a day. .  montelukast (SINGULAIR) 10 MG tablet, TAKE 1 TABLET BY MOUTH AT BEDTIME EVERY DAY .  promethazine (PHENERGAN) 25 MG tablet, Take 1 tablet (25 mg total) by mouth every 6 (six) hours as needed for nausea or vomiting.  Current Outpatient Medications (Analgesics):  Marland Kitchen  Apremilast 10 & 20 & 30 MG TBPK, Take as directed .  diclofenac (VOLTAREN) 75 MG EC tablet, Take 1 tablet (75 mg total) by mouth 2 (two) times daily as needed. .   Galcanezumab-gnlm (EMGALITY) 120 MG/ML SOAJ, Inject 1 mL into the skin every 30 (thirty) days. .  Rimegepant Sulfate ([name]) 75 MG TBDP, Take 75 mg by mouth daily as needed. .  traMADol (ULTRAM-ER) 100 MG 24 hr tablet, Take 100 mg by mouth in the morning and at bedtime.   Current Outpatient Medications (Other):  Marland Kitchen  ARIPiprazole (ABILIFY) 5 MG tablet, Take 2 tablets (10 mg total) by mouth daily. .  betamethasone dipropionate 0.05 % cream, Apply topically 2 (two) times daily. .  Fluocinolone Acetonide Scalp (DERMA-SMOOTHE/FS SCALP) 0.01 % OIL, Apply TID .  lidocaine (LIDODERM) 5 %, Place 1 patch onto the skin daily. Remove & Discard patch within 12 hours or as directed by MD .  lisdexamfetamine (VYVANSE) 50 MG capsule, Take 1 capsule (50 mg total) by mouth daily. .  metaxalone (SKELAXIN) 800 MG tablet, Take 1 tablet (800 mg total) by mouth 3 (three) times daily as needed for muscle spasms. Marland Kitchen  omeprazole (PRILOSEC) 20 MG capsule, Take 1 capsule (20 mg total) by mouth daily. .  traZODone (DESYREL) 50 MG tablet, Take 1.5 tablets (75 mg total) by mouth at bedtime as needed for sleep.   Reviewed prior external information including notes and imaging from  primary care provider As well as notes that were available from care everywhere and other healthcare systems.  Past medical history, social, surgical and family history all reviewed in electronic medical record.  No pertanent information unless stated regarding to the chief complaint.   Review of Systems:  No headache, visual changes, nausea, vomiting, diarrhea, constipation, dizziness, abdominal pain, skin rash, fevers, chills, night sweats, weight loss, swollen lymph nodes, body aches, joint swelling, chest pain, shortness of breath, mood changes. POSITIVE muscle aches  Objective  There were no vitals taken for this visit.   General: No apparent distress alert and oriented x3 mood and affect normal, dressed appropriately.  HEENT: Pupils  equal, extraocular movements intact  Respiratory: Patient's speak in full sentences and does not appear short of breath  Cardiovascular: No lower extremity edema, non tender, no erythema  Gait normal with good balance and coordination.  MSK:  Non tender with full range of motion and good stability and symmetric strength and tone of shoulders, elbows, wrist, hip, knee and ankles bilaterally.     Impression and Recommendations:     The above documentation has been reviewed and is accurate and complete Lyndal Pulley, DO

## 2021-03-01 ENCOUNTER — Ambulatory Visit: Payer: 59 | Admitting: Family Medicine

## 2021-03-02 ENCOUNTER — Encounter: Payer: Self-pay | Admitting: Family Medicine

## 2021-03-07 NOTE — Progress Notes (Signed)
Barbara Wood Phone: [phone] Subjective:   [name], am serving as a scribe for Dr. Hulan Saas. This visit occurred during the SARS-CoV-2 public health emergency.  Safety protocols were in place, including screening questions prior to the visit, additional usage of staff PPE, and extensive cleaning of exam room while observing appropriate contact time as indicated for disinfecting solutions.   I'm seeing this patient by the request  of:  Cirigliano, Garvin Fila, DO  CC: Neck pain   [id number]  Barbara Wood is a 51 y.o. female coming in with complaint of cervical spine pain. Pain for past 15 years. Also suffers from migraines that are secondary to neck pain and tightness. Has about one a week which is down from 25 a month. No radicular symptoms. Using theracane and lidocaine patches.  Patient has had chronic lung difficulty for quite some time in her life.  Was diagnosed with more of a myofascial pain and potential fibromyalgia in her 42s.  Was found to have Hashimoto disease but continues to have myofascial pain.  Has responded to osteopathic manipulation in the past.  Continues to be able to stay active.  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  Xray cervical 11/2020 C5-C6 narrowing with anterior osteophytes was noted. Syndesmophytes were  noted. Mild facet joint arthropathy was noted.   Impression: C5-C6 narrowing with anterior osteophytes was noted.       Past Medical History:  Diagnosis Date  . Allergy Age 50  . Asthma Since age 6  . Depression Age 63  . Hashimoto's disease   . Heart murmur Mitral valve prolapse age 63  . Migraine   . Psoriasis   . Thyroid disease Since age 84   Past Surgical History:  Procedure Laterality Date  . BREAST SURGERY  Reduction age 51  . CHOLECYSTECTOMY  Age 73  . KNEE ARTHROSCOPY W/ LATERAL  RELEASE Right   . WISDOM TOOTH EXTRACTION     Social History   Socioeconomic History  . Marital status: Married    Spouse name: Not on file  . Number of children: Not on file  . Years of education: Not on file  . Highest education level: Not on file  Occupational History  . Not on file  Tobacco Use  . Smoking status: Never Smoker  . Smokeless tobacco: Never Used  Vaping Use  . Vaping Use: Never used  Substance and Sexual Activity  . Alcohol use: Yes    Comment: Rarely  . Drug use: Never  . Sexual activity: Yes    Comment: Infertile  Other Topics Concern  . Not on file  Social History Narrative  . Not on file   Social Determinants of Health   Financial Resource Strain: Not on file  Food Insecurity: Not on file  Transportation Needs: Not on file  Physical Activity: Not on file  Stress: Not on file  Social Connections: Not on file   Allergies  Allergen Reactions  . Amoxicillin-Pot Clavulanate Nausea And Vomiting  . Sertraline Other (See Comments)    hallucinations   . Sulfamethoxazole-Trimethoprim Hives  . Triptans Other (See Comments)    Chest pain    Family History  Problem Relation Age of Onset  . Asthma Sister   . Miscarriages / Stillbirths Sister   . Depression Sister   . Psoriasis Sister   . Cancer  Mother   . Depression Mother   . Hashimoto's thyroiditis Mother   . Diabetes Mother   . Bipolar disorder Mother   . Hypertension Father   . Ankylosing spondylitis Father   . Psoriasis Father   . Psoriasis Brother   . Migraines Brother   . ADD / ADHD Brother   . Eczema Brother   . Allergies Brother   . Psoriasis Maternal Aunt   . Psoriasis Sister   . Arthritis Sister        psoriatic arthritis   . Psoriasis Sister   . Rheum arthritis Sister   . Depression Sister   . Anxiety disorder Sister   . Eczema Sister   . Psoriasis Brother   . ADD / ADHD Daughter   . Eczema Daughter   . Asthma Daughter     Current Outpatient Medications (Endocrine &  Metabolic):  .  liothyronine (CYTOMEL) 5 MCG tablet, Take 1 tablet (5 mcg total) by mouth 3 (three) times daily. Marland Kitchen  SYNTHROID 125 MCG tablet, Take 1 tablet (125 mcg total) by mouth daily before breakfast.  Current Outpatient Medications (Cardiovascular):  .  atenolol (TENORMIN) 25 MG tablet, Take 1 tablet (25 mg total) by mouth 2 (two) times daily.  Current Outpatient Medications (Respiratory):  .  albuterol (PROVENTIL HFA) 108 (90 Base) MCG/ACT inhaler, Inhale 2 puffs into the lungs every 4 (four) hours as needed for wheezing or shortness of breath. .  Budesonide (RHINOCORT ALLERGY NA), Use 1 spray in each nostril twice daily. .  budesonide-formoterol (SYMBICORT) 160-4.5 MCG/ACT inhaler, as needed. .  cetirizine (ZYRTEC) 10 MG tablet, Take 1 tablet by mouth 2 (two) times a day. .  montelukast (SINGULAIR) 10 MG tablet, TAKE 1 TABLET BY MOUTH AT BEDTIME EVERY DAY .  promethazine (PHENERGAN) 25 MG tablet, Take 1 tablet (25 mg total) by mouth every 6 (six) hours as needed for nausea or vomiting.  Current Outpatient Medications (Analgesics):  Marland Kitchen  Apremilast 10 & 20 & 30 MG TBPK, Take as directed .  diclofenac (VOLTAREN) 75 MG EC tablet, Take 1 tablet (75 mg total) by mouth 2 (two) times daily as needed. .  Galcanezumab-gnlm (EMGALITY) 120 MG/ML SOAJ, Inject 1 mL into the skin every 30 (thirty) days. .  Rimegepant Sulfate ([name]) 75 MG TBDP, Take 75 mg by mouth daily as needed. .  traMADol (ULTRAM-ER) 100 MG 24 hr tablet, Take 100 mg by mouth in the morning and at bedtime.   Current Outpatient Medications (Other):  Marland Kitchen  ARIPiprazole (ABILIFY) 5 MG tablet, Take 2 tablets (10 mg total) by mouth daily. .  betamethasone dipropionate 0.05 % cream, Apply topically 2 (two) times daily. .  Fluocinolone Acetonide Scalp (DERMA-SMOOTHE/FS SCALP) 0.01 % OIL, Apply TID .  lidocaine (LIDODERM) 5 %, Place 1 patch onto the skin daily. Remove & Discard patch within 12 hours or as directed by MD .   lisdexamfetamine (VYVANSE) 50 MG capsule, Take 1 capsule (50 mg total) by mouth daily. .  metaxalone (SKELAXIN) 800 MG tablet, Take 1 tablet (800 mg total) by mouth 3 (three) times daily as needed for muscle spasms. Marland Kitchen  omeprazole (PRILOSEC) 20 MG capsule, Take 1 capsule (20 mg total) by mouth daily. .  traZODone (DESYREL) 50 MG tablet, Take 1.5 tablets (75 mg total) by mouth at bedtime as needed for sleep.   Reviewed prior external information including notes and imaging from  primary care provider As well as notes that were available from care everywhere and other  healthcare systems.  Past medical history, social, surgical and family history all reviewed in electronic medical record.  No pertanent information unless stated regarding to the chief complaint.   Review of Systems:  No  visual changes, nausea, vomiting, diarrhea, constipation, dizziness, abdominal pain, skin rash, fevers, chills, night sweats, weight loss, swollen lymph nodes, joint swelling, chest pain, shortness of breath, mood changes. POSITIVE muscle aches, body aches, headache  Objective  Blood pressure 116/82, pulse (!) 112, height 5\' 7"  (1.702 m), weight 208 lb (94.3 kg), SpO2 98 %.   General: No apparent distress alert and oriented x3 mood and affect normal, dressed appropriately.  HEENT: Pupils equal, extraocular movements intact  Respiratory: Patient's speak in full sentences and does not appear short of breath  Cardiovascular: No lower extremity edema, non tender, no erythema  Gait normal with good balance and coordination.  MSK:   Neck exam shows very mild loss of lordosis.  Patient though does have poor posture.  Patient does have some mild protraction of the shoulders bilaterally.  Patient has very mild scapular dyskinesis right greater than left.  Negative Spurling's noted today.  5-5 strength of the upper extremities.  Patient has some mild weakness of hip abductors and does have mild loss of lordosis of the  lumbar spine.  Mild tightness with FABER test.  Neurovascularly intact in all extremities.  Osteopathic findings C6 flexed rotated and side bent left T5 extended rotated and side bent left inhaled third rib T9 extended rotated and side bent right L1 flexed rotated and side bent right Sacrum right on right     Impression and Recommendations:     The above documentation has been reviewed and is accurate and complete Lyndal Pulley, DO

## 2021-03-08 ENCOUNTER — Other Ambulatory Visit: Payer: Self-pay

## 2021-03-08 ENCOUNTER — Ambulatory Visit: Payer: 59 | Admitting: Family Medicine

## 2021-03-08 ENCOUNTER — Encounter: Payer: Self-pay | Admitting: Family Medicine

## 2021-03-08 VITALS — BP 116/82 | HR 112 | Ht 67.0 in | Wt 208.0 lb

## 2021-03-08 DIAGNOSIS — M5030 Other cervical disc degeneration, unspecified cervical region: Secondary | ICD-10-CM | POA: Diagnosis not present

## 2021-03-08 DIAGNOSIS — M999 Biomechanical lesion, unspecified: Secondary | ICD-10-CM | POA: Insufficient documentation

## 2021-03-08 DIAGNOSIS — M7918 Myalgia, other site: Secondary | ICD-10-CM | POA: Diagnosis not present

## 2021-03-08 NOTE — Patient Instructions (Signed)
Good to see you Scapular exercises B complex with 1000 mcg of B12 and 100 mg of B6 See me again in 6-8 weeks

## 2021-03-08 NOTE — Assessment & Plan Note (Signed)
Patient does have x-rays showing the patient does have an anterior osteophyte that does show narrowing at the C5-C6 region.  Mild facet arthropathy otherwise.

## 2021-03-08 NOTE — Assessment & Plan Note (Signed)

## 2021-03-08 NOTE — Assessment & Plan Note (Signed)
Patient has had more myofascial pain over the course of time.  Does have some degenerative disc disease of the cervical spine.  Patient feels that the myofascial pain could have came from patient's Hashimoto's disease and there is still a possibility of a psoriatic arthritis.  Patient now has responded well to osteopathic manipulation in the past and we did attempt it again today.  Patient did tolerate the procedure very well.  Patient given home exercises to work on posture and ergonomics.  Patient has muscle relaxers if necessary from another provider.  Has also taken anti-inflammatories.  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  Patient will follow up with me again in 6 weeks.

## 2021-03-29 ENCOUNTER — Emergency Department (HOSPITAL_BASED_OUTPATIENT_CLINIC_OR_DEPARTMENT_OTHER)
Admission: EM | Admit: 2021-03-29 | Discharge: 2021-03-29 | Disposition: A | Discharge status: Home / Self Care | Payer: 59 | Attending: Emergency Medicine | Admitting: Emergency Medicine

## 2021-03-29 ENCOUNTER — Other Ambulatory Visit: Payer: Self-pay

## 2021-03-29 ENCOUNTER — Emergency Department (HOSPITAL_BASED_OUTPATIENT_CLINIC_OR_DEPARTMENT_OTHER): Payer: 59

## 2021-03-29 ENCOUNTER — Encounter (HOSPITAL_BASED_OUTPATIENT_CLINIC_OR_DEPARTMENT_OTHER): Payer: Self-pay | Admitting: Emergency Medicine

## 2021-03-29 DIAGNOSIS — R110 Nausea: Secondary | ICD-10-CM | POA: Diagnosis not present

## 2021-03-29 DIAGNOSIS — R1031 Right lower quadrant pain: Secondary | ICD-10-CM | POA: Diagnosis not present

## 2021-03-29 DIAGNOSIS — K5900 Constipation, unspecified: Secondary | ICD-10-CM | POA: Insufficient documentation

## 2021-03-29 DIAGNOSIS — J45909 Unspecified asthma, uncomplicated: Secondary | ICD-10-CM | POA: Diagnosis not present

## 2021-03-29 DIAGNOSIS — K760 Fatty (change of) liver, not elsewhere classified: Secondary | ICD-10-CM | POA: Diagnosis not present

## 2021-03-29 DIAGNOSIS — R109 Unspecified abdominal pain: Secondary | ICD-10-CM | POA: Diagnosis not present

## 2021-03-29 LAB — CBC WITH DIFFERENTIAL/PLATELET
Abs Immature Granulocytes: 0.04 10*3/uL (ref 0.00–0.07)
Basophils Absolute: 0.1 10*3/uL (ref 0.0–0.1)
Basophils Relative: 1 %
Eosinophils Absolute: 0.2 10*3/uL (ref 0.0–0.5)
Eosinophils Relative: 2 %
HCT: 39.1 % (ref 36.0–46.0)
Hemoglobin: 12.5 g/dL (ref 12.0–15.0)
Immature Granulocytes: 0 %
Lymphocytes Relative: 21 %
Lymphs Abs: 2.1 10*3/uL (ref 0.7–4.0)
MCH: 27.3 pg (ref 26.0–34.0)
MCHC: 32 g/dL (ref 30.0–36.0)
MCV: 85.4 fL (ref 80.0–100.0)
Monocytes Absolute: 0.6 10*3/uL (ref 0.1–1.0)
Monocytes Relative: 6 %
Neutro Abs: 7 10*3/uL (ref 1.7–7.7)
Neutrophils Relative %: 70 %
Platelets: 275 10*3/uL (ref 150–400)
RBC: 4.58 MIL/uL (ref 3.87–5.11)
RDW: 13.2 % (ref 11.5–15.5)
WBC: 10 10*3/uL (ref 4.0–10.5)
nRBC: 0 % (ref 0.0–0.2)

## 2021-03-29 LAB — COMPREHENSIVE METABOLIC PANEL
ALT: 9 U/L (ref 0–44)
AST: 12 U/L — ABNORMAL LOW (ref 15–41)
Albumin: 4.1 g/dL (ref 3.5–5.0)
Alkaline Phosphatase: 73 U/L (ref 38–126)
Anion gap: 9 (ref 5–15)
BUN: 11 mg/dL (ref 6–20)
CO2: 26 mmol/L (ref 22–32)
Calcium: 8.7 mg/dL — ABNORMAL LOW (ref 8.9–10.3)
Chloride: 104 mmol/L (ref 98–111)
Creatinine, Ser: 0.63 mg/dL (ref 0.44–1.00)
GFR, Estimated: >60 mL/min (ref >60)
Glucose, Bld: 91 mg/dL (ref 70–99)
Potassium: 3.5 mmol/L (ref 3.5–5.1)
Sodium: 139 mmol/L (ref 135–145)
Total Bilirubin: 0.6 mg/dL (ref 0.3–1.2)
Total Protein: 6.9 g/dL (ref 6.5–8.1)

## 2021-03-29 LAB — URINALYSIS, ROUTINE W REFLEX MICROSCOPIC
Bilirubin Urine: NEGATIVE
Glucose, UA: NEGATIVE mg/dL
Hgb urine dipstick: NEGATIVE
Ketones, ur: 15 mg/dL — AB
Nitrite: NEGATIVE
Specific Gravity, Urine: 1.031 — ABNORMAL HIGH (ref 1.005–1.030)
pH: 5 (ref 5.0–8.0)

## 2021-03-29 LAB — LIPASE, BLOOD: Lipase: 10 U/L — ABNORMAL LOW (ref 11–51)

## 2021-03-29 LAB — PREGNANCY, URINE: Preg Test, Ur: NEGATIVE

## 2021-03-29 MED ORDER — SODIUM CHLORIDE 0.9 % IV BOLUS: 1000.0000 mL | Freq: Once | INTRAVENOUS | Status: DC

## 2021-03-29 MED ORDER — DIPHENHYDRAMINE HCL 50 MG/ML IJ SOLN
25.0000 mg | Freq: Once | INTRAMUSCULAR | Status: AC
  Administered 2021-03-29: 25 mg via INTRAVENOUS
  Filled 2021-03-29: qty 1

## 2021-03-29 MED ORDER — IOHEXOL 300 MG/ML  SOLN
100.0000 mL | Freq: Once | INTRAMUSCULAR | Status: AC | PRN
  Administered 2021-03-29: 80 mL via INTRAVENOUS

## 2021-03-29 MED ORDER — PROCHLORPERAZINE EDISYLATE 10 MG/2ML IJ SOLN
10.0000 mg | Freq: Once | INTRAMUSCULAR | Status: AC
  Administered 2021-03-29: 10 mg via INTRAVENOUS
  Filled 2021-03-29: qty 2

## 2021-03-29 MED ORDER — MORPHINE SULFATE (PF) 4 MG/ML IV SOLN
4.0000 mg | Freq: Once | INTRAVENOUS | Status: AC
  Administered 2021-03-29: 4 mg via INTRAVENOUS
  Filled 2021-03-29: qty 1

## 2021-03-29 NOTE — ED Notes (Signed)
Patient transported to CT 

## 2021-03-29 NOTE — ED Triage Notes (Signed)
Pt stated that she has been experiencing lower back pain 2 days ago. Last night the Pain has  radiated to the RLQ and it is the only place that the Pt is currently experiencing pain (6 out 10)

## 2021-03-29 NOTE — ED Provider Notes (Signed)
[facility] EMERGENCY DEPT Provider Note   CSN: [id number] Arrival date & time: 03/29/21  1307     History Chief Complaint  Patient presents with  . Abdominal Pain    Barbara Wood is a 51 y.o. female.  51 yo F with a chief complaints of right lower quadrant abdominal pain.  She has been having some right-sided flank discomfort over the past 48 hours.  Started to have some abdominal cramping.  Has now moved into the right lower quadrant.  Was a bit uncomfortable and seem to come and go but now is constant and described as a more sharp and boring pain.  Worse with palpation.  Nauseated and not felt like eating throughout the day today.  She denies trauma.  Denies fevers.  The history is provided by the patient.  Abdominal Pain Pain location:  RLQ Pain quality: sharp   Pain quality: not aching   Pain radiates to:  Does not radiate Pain severity:  Moderate Onset quality:  Gradual Duration:  2 days Timing:  Constant Progression:  Worsening Chronicity:  New Relieved by:  Nothing Worsened by:  Movement and palpation Ineffective treatments:  None tried Associated symptoms: constipation and nausea   Associated symptoms: no chest pain, no chills, no dysuria, no fever, no shortness of breath and no vomiting        Past Medical History:  Diagnosis Date  . Allergy Age 49  . Asthma Since age 25  . Depression Age 50  . Hashimoto's disease   . Heart murmur Mitral valve prolapse age 89  . Migraine   . Psoriasis   . Thyroid disease Since age 49    Patient Active Problem List   Diagnosis Date Noted  . Nonallopathic lesion of cervical region 03/08/2021  . Degenerative disc disease, cervical 03/08/2021  . Changing skin lesion 07/19/2020  . 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 Asthma 07/29/2019  . Hashimoto's disease 07/29/2019  . Myofascial pain 07/29/2019  . Neck pain 07/29/2019  . Psoriasis 07/29/2019  . Migraine 07/26/1986    Past Surgical History:   Procedure Laterality Date  . BREAST SURGERY  Reduction age 32  . CHOLECYSTECTOMY  Age 88  . KNEE ARTHROSCOPY W/ LATERAL RELEASE Right   . WISDOM TOOTH EXTRACTION       OB History   No obstetric history on file.     Family History  Problem Relation Age of Onset  . Asthma Sister   . Miscarriages / Stillbirths Sister   . Depression Sister   . Psoriasis Sister   . Cancer Mother   . Depression Mother   . Hashimoto's thyroiditis Mother   . Diabetes Mother   . Bipolar disorder Mother   . Hypertension Father   . Ankylosing spondylitis Father   . Psoriasis Father   . Psoriasis Brother   . Migraines Brother   . ADD / ADHD Brother   . Eczema Brother   . Allergies Brother   . Psoriasis Maternal Aunt   . Psoriasis Sister   . Arthritis Sister        psoriatic arthritis   . Psoriasis Sister   . Rheum arthritis Sister   . Depression Sister   . Anxiety disorder Sister   . Eczema Sister   . Psoriasis Brother   . ADD / ADHD Daughter   . Eczema Daughter   . Asthma Daughter     Social History   Tobacco Use  . Smoking status: Never Smoker  . Smokeless  tobacco: Never Used  Vaping Use  . Vaping Use: Never used  Substance Use Topics  . Alcohol use: Yes    Comment: Rarely  . Drug use: Never    Home Medications Prior to Admission medications   Medication Sig Start Date End Date Taking? Authorizing Provider  albuterol (PROVENTIL HFA) 108 (90 Base) MCG/ACT inhaler Inhale 2 puffs into the lungs every 4 (four) hours as needed for wheezing or shortness of breath. 11/16/19   Cirigliano, Mary K, DO  Apremilast 10 & 20 & 30 MG TBPK Take as directed 01/16/21   Tresa Garter, MD  ARIPiprazole (ABILIFY) 5 MG tablet Take 2 tablets (10 mg total) by mouth daily. 04/20/20   Cirigliano, Garvin Fila, DO  atenolol (TENORMIN) 25 MG tablet Take 1 tablet (25 mg total) by mouth 2 (two) times daily. 01/25/21   Cirigliano, Garvin Fila, DO  betamethasone dipropionate 0.05 % cream Apply topically 2 (two)  times daily. 02/05/20   Cirigliano, Garvin Fila, DO  Budesonide (RHINOCORT ALLERGY NA) Use 1 spray in each nostril twice daily.    [provider]  budesonide-formoterol (SYMBICORT) 160-4.5 MCG/ACT inhaler as needed. 04/22/19   [provider]  cetirizine (ZYRTEC) 10 MG tablet Take 1 tablet by mouth 2 (two) times a day.    [provider]  diclofenac (VOLTAREN) 75 MG EC tablet Take 1 tablet (75 mg total) by mouth 2 (two) times daily as needed. 02/22/21   Ronnald Nian, DO  Fluocinolone Acetonide Scalp (DERMA-SMOOTHE/FS SCALP) 0.01 % OIL Apply TID 02/05/20   Cirigliano, Mary K, DO  Galcanezumab-gnlm (EMGALITY) 120 MG/ML SOAJ Inject 1 mL into the skin every 30 (thirty) days. 07/14/20   Cirigliano, Mary K, DO  lidocaine (LIDODERM) 5 % Place 1 patch onto the skin daily. Remove & Discard patch within 12 hours or as directed by MD 07/14/20   Cirigliano, Garvin Fila, DO  liothyronine (CYTOMEL) 5 MCG tablet Take 1 tablet (5 mcg total) by mouth 3 (three) times daily. 10/19/20   Cirigliano, Garvin Fila, DO  lisdexamfetamine (VYVANSE) 50 MG capsule Take 1 capsule (50 mg total) by mouth daily. 01/25/21   Cirigliano, Garvin Fila, DO  metaxalone (SKELAXIN) 800 MG tablet Take 1 tablet (800 mg total) by mouth 3 (three) times daily as needed for muscle spasms. 02/22/21   Cirigliano, Mary K, DO  montelukast (SINGULAIR) 10 MG tablet TAKE 1 TABLET BY MOUTH AT BEDTIME EVERY DAY 02/15/21   Cirigliano, Mary K, DO  omeprazole (PRILOSEC) 20 MG capsule Take 1 capsule (20 mg total) by mouth daily. 04/06/20   Mauri Pole, MD  promethazine (PHENERGAN) 25 MG tablet Take 1 tablet (25 mg total) by mouth every 6 (six) hours as needed for nausea or vomiting. 07/14/20   Cirigliano, Garvin Fila, DO  Rimegepant Sulfate ([name]) 75 MG TBDP Take 75 mg by mouth daily as needed. 01/25/21   Cirigliano, Garvin Fila, DO  SYNTHROID 125 MCG tablet Take 1 tablet (125 mcg total) by mouth daily before breakfast. 10/19/20   Cirigliano, Garvin Fila, DO   traMADol (ULTRAM-ER) 100 MG 24 hr tablet Take 100 mg by mouth in the morning and at bedtime.    [provider]  traZODone (DESYREL) 50 MG tablet Take 1.5 tablets (75 mg total) by mouth at bedtime as needed for sleep. 01/25/21   Cirigliano, Garvin Fila, DO    Allergies    Amoxicillin-pot clavulanate, Sertraline, Sulfamethoxazole-trimethoprim, and Triptans  Review of Systems   Review of Systems  Constitutional:  Negative for chills and fever.  HENT: Negative for congestion and rhinorrhea.   Eyes: Negative for redness and visual disturbance.  Respiratory: Negative for shortness of breath and wheezing.   Cardiovascular: Negative for chest pain and palpitations.  Gastrointestinal: Positive for abdominal pain, constipation and nausea. Negative for vomiting.  Genitourinary: Negative for dysuria and urgency.  Musculoskeletal: Negative for arthralgias and myalgias.  Skin: Negative for pallor and wound.  Neurological: Negative for dizziness and headaches.    Physical Exam Updated Vital Signs BP 110/72 (BP Location: Right Arm)   Pulse 94   Temp 98.5 F (36.9 C) (Oral)   Resp 16   Ht 5\' 7"  (1.702 m)   Wt 93 kg   SpO2 97%   BMI 32.11 kg/m   Physical Exam Vitals and nursing note reviewed.  Constitutional:      General: She is not in acute distress.    Appearance: She is well-developed. She is not diaphoretic.  HENT:     Head: Normocephalic and atraumatic.  Eyes:     Pupils: Pupils are equal, round, and reactive to light.  Cardiovascular:     Rate and Rhythm: Normal rate and regular rhythm.     Heart sounds: No murmur heard. No friction rub. No gallop.   Pulmonary:     Effort: Pulmonary effort is normal.     Breath sounds: No wheezing or rales.  Abdominal:     General: There is no distension.     Palpations: Abdomen is soft.     Tenderness: There is no abdominal tenderness.     Comments: Positive Rosvigs and psoas signs.  Pain with shaking of the bed.  Pain worse with  palpation of the right lower quadrant.  Musculoskeletal:        General: No tenderness.     Cervical back: Normal range of motion and neck supple.  Skin:    General: Skin is warm and dry.  Neurological:     Mental Status: She is alert and oriented to person, place, and time.  Psychiatric:        Behavior: Behavior normal.     ED Results / Procedures / Treatments   Labs (all labs ordered are listed, but only abnormal results are displayed) Labs Reviewed  COMPREHENSIVE METABOLIC PANEL - Abnormal; Notable for the following components:      Result Value   Calcium 8.7 (*)    AST 12 (*)    All other components within normal limits  LIPASE, BLOOD - Abnormal; Notable for the following components:   Lipase 10 (*)    All other components within normal limits  URINALYSIS, ROUTINE W REFLEX MICROSCOPIC - Abnormal; Notable for the following components:   APPearance HAZY (*)    Specific Gravity, Urine 1.031 (*)    Ketones, ur 15 (*)    Protein, ur TRACE (*)    Leukocytes,Ua MODERATE (*)    Bacteria, UA RARE (*)    All other components within normal limits  CBC WITH DIFFERENTIAL/PLATELET  PREGNANCY, URINE    EKG None  Radiology CT ABDOMEN PELVIS W CONTRAST  Result Date: 03/29/2021 CLINICAL DATA:  Abdominal pain, primarily right cited EXAM: CT ABDOMEN AND PELVIS WITH CONTRAST TECHNIQUE: Multidetector CT imaging of the abdomen and pelvis was performed using the standard protocol following bolus administration of intravenous contrast. CONTRAST:  87mL OMNIPAQUE IOHEXOL 300 MG/ML  SOLN COMPARISON:  None. FINDINGS: Lower chest: Lung bases are clear. Hepatobiliary: There is hepatic steatosis. No focal liver lesions are  appreciable. The gallbladder is absent. There is no biliary duct dilatation. Pancreas: There is no pancreatic mass or inflammatory focus. Spleen: No splenic lesions are evident. Adrenals/Urinary Tract: Adrenals bilaterally appear normal. There is no evident renal mass or  hydronephrosis on either side. There is no appreciable renal or ureteral calculus on either side. Urinary bladder is midline with wall thickness within normal limits. Stomach/Bowel: There is no appreciable bowel wall or mesenteric thickening. No evident bowel obstruction. The terminal ileum appears normal. Appendix is rather diminutive. No appendiceal inflammation evident. No free air or portal venous air. Vascular/Lymphatic: No abdominal aortic aneurysm. No arterial vascular lesions are evident. Major venous structures appear patent. There is no evident adenopathy in the abdomen or pelvis. Reproductive: The uterus is anteverted. No evident adnexal mass beyond apparent dominant follicle in the left ovary measuring 1.7 x 1.5 cm. Other: No evident ascites or abscess in the abdomen or pelvis. There is mild fat in the umbilicus. Musculoskeletal: No blastic or lytic bone lesions. No intramuscular lesions are evident. IMPRESSION: 1. No bowel wall thickening or bowel obstruction. No abscess in the abdomen or pelvis. Appendix appears unremarkable. 2.  Hepatic steatosis. 3. No renal or ureteral calculus. No hydronephrosis. Urinary bladder wall thickness normal. 4.  Gallbladder absent. Electronically Signed   By: Lowella Grip III M.D.   On: 03/29/2021 15:06    Procedures Procedures   Medications Ordered in ED Medications  sodium chloride 0.9 % bolus 1,000 mL (1,000 mLs Intravenous Not Given 03/29/21 1515)  morphine 4 MG/ML injection 4 mg (4 mg Intravenous Given 03/29/21 1421)  prochlorperazine (COMPAZINE) injection 10 mg (10 mg Intravenous Given 03/29/21 1415)  diphenhydrAMINE (BENADRYL) injection 25 mg (25 mg Intravenous Given 03/29/21 1408)  iohexol (OMNIPAQUE) 300 MG/ML solution 100 mL (80 mLs Intravenous Contrast Given 03/29/21 1428)    ED Course  I have reviewed the triage vital signs and the nursing notes.  Pertinent labs & imaging results that were available during my care of the patient were reviewed  by me and considered in my medical decision making (see chart for details).    MDM Rules/Calculators/A&P                          51 yo F with a chief complaint of right lower quadrant abdominal pain.  Concern for appendicitis.  Will obtain blood work and CT imaging.  Treat pain and nausea.  CT scan is negative.  Blood work without leukocytosis no anemia no LFT elevation lipase is normal.  UA without obvious infection.  I discussed results with the patient.  We will have her follow-up with her family doctor.  Of note the patient has been having some referred back pain, wonder if this is a possible cause of her symptoms.  Also consider constipation as she does have a increase stool burden in the area of discomfort.  3:19 PM:  I have discussed the diagnosis/risks/treatment options with the patient and believe the pt to be eligible for discharge home to follow-up with PCP. We also discussed returning to the ED immediately if new or worsening sx occur. We discussed the sx which are most concerning (e.g., sudden worsening pain, fever, inability to tolerate by mouth) that necessitate immediate return. Medications administered to the patient during their visit and any new prescriptions provided to the patient are listed below.  Medications given during this visit Medications  sodium chloride 0.9 % bolus 1,000 mL (1,000 mLs Intravenous Not Given  03/29/21 1515)  morphine 4 MG/ML injection 4 mg (4 mg Intravenous Given 03/29/21 1421)  prochlorperazine (COMPAZINE) injection 10 mg (10 mg Intravenous Given 03/29/21 1415)  diphenhydrAMINE (BENADRYL) injection 25 mg (25 mg Intravenous Given 03/29/21 1408)  iohexol (OMNIPAQUE) 300 MG/ML solution 100 mL (80 mLs Intravenous Contrast Given 03/29/21 1428)     The patient appears reasonably screen and/or stabilized for discharge and I doubt any other medical condition or other [facility] requiring further screening, evaluation, or treatment in the ED at this time prior to  discharge.   Final Clinical Impression(s) / ED Diagnoses Final diagnoses:  RLQ abdominal pain    Rx / DC Orders ED Discharge Orders    None       Deno Etienne, DO 03/29/21 1519

## 2021-03-29 NOTE — Discharge Instructions (Signed)
Follow up with your doctor.  Return for worsening pain, fever, inability to eat or drink.

## 2021-04-01 ENCOUNTER — Other Ambulatory Visit (HOSPITAL_COMMUNITY): Payer: Self-pay

## 2021-04-04 ENCOUNTER — Other Ambulatory Visit (HOSPITAL_COMMUNITY): Payer: Self-pay

## 2021-04-04 MED FILL — Rimegepant Sulfate Tab Disint 75 MG: ORAL | 30 days supply | Qty: 8 | Fill #0 | Status: AC

## 2021-04-12 ENCOUNTER — Other Ambulatory Visit: Payer: Self-pay | Admitting: Family Medicine

## 2021-04-12 DIAGNOSIS — F988 Other specified behavioral and emotional disorders with onset usually occurring in childhood and adolescence: Secondary | ICD-10-CM

## 2021-04-12 NOTE — Telephone Encounter (Signed)
Last OV 01/25/21 Last fill 01/25/21  #90/0

## 2021-04-12 NOTE — Telephone Encounter (Signed)
What is the name of the medication? lisdexamfetamine (VYVANSE) 50 MG capsule [[id number]] and Tramadol.  Have you contacted your pharmacy to request a refill? Pt is needing a refill for these scripts.   Which pharmacy would you like this sent to? [facility]  [facility]  [address] [place] [address]  Phone:  [phone] Fax:  [phone]  DEA #:  [id number]     Patient notified that their request is being sent to the clinical staff for review and that they should receive a call once it is complete. If they do not receive a call within 72 hours they can check with their pharmacy or our office.

## 2021-04-12 NOTE — Telephone Encounter (Signed)
Request sent to the doctor of the day.

## 2021-04-13 ENCOUNTER — Telehealth: Payer: Self-pay

## 2021-04-13 ENCOUNTER — Other Ambulatory Visit (HOSPITAL_COMMUNITY): Payer: Self-pay

## 2021-04-13 DIAGNOSIS — M7918 Myalgia, other site: Secondary | ICD-10-CM

## 2021-04-13 MED ORDER — TRAMADOL HCL 50 MG PO TABS
50.0000 mg | ORAL_TABLET | Freq: Two times a day (BID) | ORAL | 0 refills | Status: DC | PRN
  Filled 2021-04-13 (×2): qty 30, 15d supply, fill #0

## 2021-04-13 MED ORDER — LISDEXAMFETAMINE DIMESYLATE 50 MG PO CAPS
50.0000 mg | ORAL_CAPSULE | Freq: Every day | ORAL | 0 refills | Status: DC
  Filled 2021-04-13 (×3): qty 90, 90d supply, fill #0

## 2021-04-13 MED ORDER — TRAMADOL HCL ER 100 MG PO TB24
100.0000 mg | ORAL_TABLET | Freq: Two times a day (BID) | ORAL | 0 refills | Status: DC
  Filled 2021-04-13: qty 30, 15d supply, fill #0

## 2021-04-13 NOTE — Telephone Encounter (Signed)
Pt called again stating that the medication Tramadol (Ultram-ER) 50mg , need to be resent, as she said that she does not take the ER, but traMADol (ULTRAM) 50 MG tablet.  Please resend medication, as the pt will be leaving to go out of town today.  Thank you.

## 2021-04-13 NOTE — Telephone Encounter (Signed)
Dr. Ethelene Hal has filled pt's Vyvanse today.  Tramadol, Historical provider.  Please see message and advise.

## 2021-04-13 NOTE — Telephone Encounter (Signed)
Pt is calling regarding her request yesterday for refills. Her Vyvanse was sent in but not the Tramadol. It was last filled by Dr C on 01/25/21. She wants to know why or can it be sent to [facility]. Her [facility] cell # is [phone]  (I spoke to [facility] about it earlier. Advised pt was last prescribed that by a historical provider. I'm not sure what to tell the patient. Does she need to have an ov to get a refill?)  Thanks

## 2021-04-14 ENCOUNTER — Other Ambulatory Visit (HOSPITAL_COMMUNITY): Payer: Self-pay

## 2021-04-17 ENCOUNTER — Other Ambulatory Visit (HOSPITAL_COMMUNITY): Payer: Self-pay

## 2021-04-19 ENCOUNTER — Other Ambulatory Visit (HOSPITAL_COMMUNITY): Payer: Self-pay

## 2021-04-19 ENCOUNTER — Ambulatory Visit: Payer: 59 | Admitting: Family Medicine

## 2021-04-19 ENCOUNTER — Other Ambulatory Visit: Payer: Self-pay | Admitting: Family Medicine

## 2021-04-19 DIAGNOSIS — M7918 Myalgia, other site: Secondary | ICD-10-CM

## 2021-04-19 MED ORDER — TRAMADOL HCL 50 MG PO TABS: 100.0000 mg | ORAL_TABLET | Freq: Two times a day (BID) | ORAL | 0 refills | Status: DC | PRN

## 2021-04-19 MED ORDER — TRAMADOL HCL 50 MG PO TABS
100.0000 mg | ORAL_TABLET | Freq: Two times a day (BID) | ORAL | 0 refills | Status: DC | PRN
  Filled 2021-04-19: qty 360, 90d supply, fill #0

## 2021-04-19 NOTE — Telephone Encounter (Signed)
I Rx tramadol every 3 mo for pt, last on 12/2020. 3 mo supply sent today. Please call pt to make her aware

## 2021-04-19 NOTE — Progress Notes (Signed)
Pt out of town in [place] and needs 7 day supply of tramadol sent to local pharm there until she returns home to pick up Rx at [facility]

## 2021-04-19 NOTE — Addendum Note (Signed)
Addended by: Ronnald Nian on: 04/19/2021 11:30 AM   Modules accepted: Orders

## 2021-04-25 NOTE — Progress Notes (Signed)
[facility] [place] Phone: [phone] Subjective:   [name], am serving as a scribe for Dr. Hulan Saas. This visit occurred during the SARS-CoV-2 public health emergency.  Safety protocols were in place, including screening questions prior to the visit, additional usage of staff PPE, and extensive cleaning of exam room while observing appropriate contact time as indicated for disinfecting solutions.   I'm seeing this patient by the request  of:  Cirigliano, Garvin Fila, DO  CC: Low back pain and right hip pain  JSE:[id number]  Barbara Wood is a 51 y.o. female coming in with complaint of back and neck pain. OMT 03/08/2021. Patient states that she did injury her R groin since last visit.  Patient was having significant amount of pain and then went to the emergency room.  Had a CT scan that did not show any significant finding.  Pain with IR and ER for past 4 weeks. Patient turned over in bed and had sharp pain. Patient has been working on her neck pain and does some exercises while at work.   Medications patient has been prescribed: None       Xray cervical 11/2020 C5-C6 narrowing with anterior osteophytes was noted. Syndesmophytes were  noted. Mild facet joint arthropathy was noted.   Impression: C5-C6 narrowing with anterior osteophytes was noted.   Reviewed prior external information including notes and imaging from previsou exam, outside providers and external EMR if available.   As well as notes that were available from care everywhere and other healthcare systems.  Past medical history, social, surgical and family history all reviewed in electronic medical record.  No pertanent information unless stated regarding to the chief complaint.   Past Medical History:  Diagnosis Date  . Allergy Age 51  . Asthma Since age 51  . Depression Age 51  . Hashimoto's disease   . Heart murmur Mitral valve prolapse age 51   . Migraine   . Psoriasis   . Thyroid disease Since age 16    Allergies  Allergen Reactions  . Amoxicillin-Pot Clavulanate Nausea And Vomiting  . Sertraline Other (See Comments)    hallucinations   . Sulfamethoxazole-Trimethoprim Hives  . Triptans Other (See Comments)    Chest pain      Review of Systems:  No headache, visual changes, nausea, vomiting, diarrhea, constipation, dizziness, abdominal pain, skin rash, fevers, chills, night sweats, weight loss, swollen lymph nodes, body aches, joint swelling, chest pain, shortness of breath, mood changes. POSITIVE muscle aches  Objective  Blood pressure 112/78, pulse (!) 102, height 5\' 7"  (1.702 m), weight 206 lb (93.4 kg), SpO2 98 %.   General: No apparent distress alert and oriented x3 mood and affect normal, dressed appropriately.  HEENT: Pupils equal, extraocular movements intact  Respiratory: Patient's speak in full sentences and does not appear short of breath  Cardiovascular: No lower extremity edema, non tender, no erythema  Gait normal with good balance and coordination.  MSK: Patient does have significant discomfort with palpation of multiple different joints. Back -low back exam does have significant loss of lordosis.  Patient's right hip exam shows the patient has fairly decent internal and external range of motion with mild 5 degree limitation of both ways.  Patient does have pain with resisted hip flexion.  No tenderness to palpation of the origin of the hip flexor in the lumbar spine as well.  Mild positive Corky Sox right greater than left.  Mild pain  over the piriformis.  Osteopathic findings  C2 flexed rotated and side bent right T3 extended rotated and side bent right inhaled rib T7 extended rotated and side bent left L2 flexed rotated and side bent right Sacrum right on right       Assessment and Plan:  Degenerative disc disease, cervical Known degenerative disc disease.  Responds relatively well though to  osteopathic manipulation.  Discussed which activities to do which wants to avoid.  He does also have some myofascial pain in the history of Hashimoto's disease that could also be contributing to some of it.  Discussed with patient that Back pain and hip pain seems to be more secondary to some mild to moderate hip arthritis as well as significant tightness of the hip flexors.  Discussed posture and ergonomics, which activities to do which wants to avoid.  Follow-up with me again in 6 to 8 weeks we will  Right hip pain Patient did have some right hip pain.  Responded well though to lower body manipulation today.  Patient does have tightness what appears to be more of the hip flexor than truly the joint itself.  X-rays were ordered and independently visualized by me today showing the patient had mild to moderate osteoarthritic changes of the joint.  Patient given home exercises for more stretching, discussed ergonomics at work that could be beneficial.  Follow-up with me otherwise in 6 to 8 weeks    Nonallopathic problems  Decision today to treat with OMT was based on Physical Exam  After verbal consent patient was treated with HVLA, ME, FPR techniques in cervical, rib, thoracic, lumbar, and sacral  areas  Patient tolerated the procedure well with improvement in symptoms  Patient given exercises, stretches and lifestyle modifications  See medications in patient instructions if given  Patient will follow up in 4-8 weeks      The above documentation has been reviewed and is accurate and complete Barbara Pulley, DO       Note: This dictation was prepared with Dragon dictation along with smaller phrase technology. Any transcriptional errors that result from this process are unintentional.

## 2021-04-26 ENCOUNTER — Encounter: Payer: Self-pay | Admitting: Family Medicine

## 2021-04-26 ENCOUNTER — Ambulatory Visit: Payer: 59 | Admitting: Family Medicine

## 2021-04-26 ENCOUNTER — Other Ambulatory Visit: Payer: Self-pay

## 2021-04-26 ENCOUNTER — Ambulatory Visit (INDEPENDENT_AMBULATORY_CARE_PROVIDER_SITE_OTHER): Payer: 59

## 2021-04-26 VITALS — BP 112/78 | HR 102 | Ht 67.0 in | Wt 206.0 lb

## 2021-04-26 DIAGNOSIS — M9903 Segmental and somatic dysfunction of lumbar region: Secondary | ICD-10-CM

## 2021-04-26 DIAGNOSIS — M5030 Other cervical disc degeneration, unspecified cervical region: Secondary | ICD-10-CM | POA: Diagnosis not present

## 2021-04-26 DIAGNOSIS — M7918 Myalgia, other site: Secondary | ICD-10-CM | POA: Diagnosis not present

## 2021-04-26 DIAGNOSIS — M25551 Pain in right hip: Secondary | ICD-10-CM

## 2021-04-26 DIAGNOSIS — M9908 Segmental and somatic dysfunction of rib cage: Secondary | ICD-10-CM | POA: Diagnosis not present

## 2021-04-26 DIAGNOSIS — M9902 Segmental and somatic dysfunction of thoracic region: Secondary | ICD-10-CM | POA: Diagnosis not present

## 2021-04-26 DIAGNOSIS — M9901 Segmental and somatic dysfunction of cervical region: Secondary | ICD-10-CM

## 2021-04-26 DIAGNOSIS — M9904 Segmental and somatic dysfunction of sacral region: Secondary | ICD-10-CM | POA: Diagnosis not present

## 2021-04-26 NOTE — Assessment & Plan Note (Signed)
Patient did have some right hip pain.  Responded well though to lower body manipulation today.  Patient does have tightness what appears to be more of the hip flexor than truly the joint itself.  X-rays were ordered and independently visualized by me today showing the patient had mild to moderate osteoarthritic changes of the joint.  Patient given home exercises for more stretching, discussed ergonomics at work that could be beneficial.  Follow-up with me otherwise in 6 to 8 weeks

## 2021-04-26 NOTE — Assessment & Plan Note (Signed)
Known degenerative disc disease.  Responds relatively well though to osteopathic manipulation.  Discussed which activities to do which wants to avoid.  He does also have some myofascial pain in the history of Hashimoto's disease that could also be contributing to some of it.  Discussed with patient that Back pain and hip pain seems to be more secondary to some mild to moderate hip arthritis as well as significant tightness of the hip flexors.  Discussed posture and ergonomics, which activities to do which wants to avoid.  Follow-up with me again in 6 to 8 weeks we will

## 2021-04-26 NOTE — Patient Instructions (Signed)
Good to see you Hip flexor exercises Stand more at work See me again in 6-8 weeks

## 2021-04-27 ENCOUNTER — Other Ambulatory Visit: Payer: Self-pay | Admitting: Family Medicine

## 2021-04-27 ENCOUNTER — Other Ambulatory Visit (HOSPITAL_COMMUNITY): Payer: Self-pay

## 2021-04-27 DIAGNOSIS — M7918 Myalgia, other site: Secondary | ICD-10-CM

## 2021-04-27 MED ORDER — TRAMADOL HCL 50 MG PO TABS
100.0000 mg | ORAL_TABLET | Freq: Two times a day (BID) | ORAL | 0 refills | Status: DC
  Filled 2021-04-27: qty 360, 90d supply, fill #0

## 2021-04-28 ENCOUNTER — Other Ambulatory Visit (HOSPITAL_COMMUNITY): Payer: Self-pay

## 2021-05-01 ENCOUNTER — Other Ambulatory Visit (HOSPITAL_COMMUNITY): Payer: Self-pay

## 2021-05-01 MED FILL — Levothyroxine Sodium Tab 125 MCG: ORAL | 90 days supply | Qty: 90 | Fill #0 | Status: AC

## 2021-05-01 MED FILL — Galcanezumab-gnlm Subcutaneous Soln Auto-Injector 120 MG/ML: SUBCUTANEOUS | 90 days supply | Qty: 3 | Fill #0 | Status: AC

## 2021-05-02 ENCOUNTER — Other Ambulatory Visit (HOSPITAL_COMMUNITY): Payer: Self-pay

## 2021-05-03 ENCOUNTER — Other Ambulatory Visit (HOSPITAL_COMMUNITY): Payer: Self-pay

## 2021-05-10 ENCOUNTER — Other Ambulatory Visit (HOSPITAL_COMMUNITY): Payer: Self-pay

## 2021-05-10 MED FILL — Trazodone HCl Tab 50 MG: ORAL | 90 days supply | Qty: 135 | Fill #0 | Status: AC

## 2021-05-14 MED FILL — Rimegepant Sulfate Tab Disint 75 MG: ORAL | 30 days supply | Qty: 8 | Fill #1 | Status: AC

## 2021-05-15 ENCOUNTER — Other Ambulatory Visit (HOSPITAL_COMMUNITY): Payer: Self-pay

## 2021-05-21 MED FILL — Montelukast Sodium Tab 10 MG (Base Equiv): ORAL | 90 days supply | Qty: 90 | Fill #0 | Status: AC

## 2021-05-22 ENCOUNTER — Other Ambulatory Visit (HOSPITAL_COMMUNITY): Payer: Self-pay

## 2021-05-29 ENCOUNTER — Other Ambulatory Visit: Payer: Self-pay | Admitting: Family Medicine

## 2021-05-29 DIAGNOSIS — E063 Autoimmune thyroiditis: Secondary | ICD-10-CM

## 2021-05-31 ENCOUNTER — Other Ambulatory Visit (HOSPITAL_COMMUNITY): Payer: Self-pay

## 2021-05-31 NOTE — Telephone Encounter (Signed)
Refill request for: Liothyroinine 5  Mg LR  10/19/20, #270, 1 rf  Abilify 10 mg LR 04/27/20, #90, 3 rf LOV 01/25/21 FOV  None scheduled.   Please review and advise.  Thanks.  Dm/cma

## 2021-06-01 ENCOUNTER — Other Ambulatory Visit (HOSPITAL_COMMUNITY): Payer: Self-pay

## 2021-06-01 MED ORDER — ARIPIPRAZOLE 10 MG PO TABS
10.0000 mg | ORAL_TABLET | Freq: Every day | ORAL | 3 refills | Status: DC
  Filled 2021-06-01: qty 90, 90d supply, fill #0
  Filled 2021-11-26: qty 90, 90d supply, fill #1
  Filled 2022-04-16: qty 90, 90d supply, fill #2

## 2021-06-01 MED ORDER — LIOTHYRONINE SODIUM 5 MCG PO TABS
5.0000 ug | ORAL_TABLET | Freq: Three times a day (TID) | ORAL | 3 refills | Status: DC
  Filled 2021-06-01: qty 270, 90d supply, fill #0
  Filled 2021-10-03: qty 270, 90d supply, fill #1
  Filled 2022-02-06: qty 270, 90d supply, fill #2

## 2021-06-02 ENCOUNTER — Other Ambulatory Visit (HOSPITAL_COMMUNITY): Payer: Self-pay

## 2021-06-02 MED ORDER — CARESTART COVID-19 HOME TEST VI KIT
PACK | 0 refills | Status: DC
  Filled 2021-06-02: qty 4, 4d supply, fill #0

## 2021-06-06 NOTE — Progress Notes (Signed)
[place] [address] [place] Phone: [phone] Subjective:   I Kandace Blitz am serving as a Education administrator for Dr. Hulan Saas.  This visit occurred during the SARS-CoV-2 public health emergency.  Safety protocols were in place, including screening questions prior to the visit, additional usage of staff PPE, and extensive cleaning of exam room while observing appropriate contact time as indicated for disinfecting solutions.   I'm seeing this patient by the request  of:  Cirigliano, Garvin Fila, DO  CC: neck and back pain   DGL:[id number]  Myeesha Shane is a 51 y.o. female coming in with complaint of back and neck pain. OMT 04/26/2021. Patient states she is not doing too bad right now. Has been walking a little more. Not doing the neck exercises as much as she should. Working a lot lately.   Medications patient has been prescribed: None          Reviewed prior external information including notes and imaging from previsou exam, outside providers and external EMR if available.   As well as notes that were available from care everywhere and other healthcare systems.  Past medical history, social, surgical and family history all reviewed in electronic medical record.  No pertanent information unless stated regarding to the chief complaint.   Past Medical History:  Diagnosis Date  . Allergy Age 76  . Asthma Since age 22  . Depression Age 28  . Hashimoto's disease   . Heart murmur Mitral valve prolapse age 48  . Migraine   . Psoriasis   . Thyroid disease Since age 19    Allergies  Allergen Reactions  . Amoxicillin-Pot Clavulanate Nausea And Vomiting  . Sertraline Other (See Comments)    hallucinations   . Sulfamethoxazole-Trimethoprim Hives  . Triptans Other (See Comments)    Chest pain      Review of Systems:  No headache, visual changes, nausea, vomiting, diarrhea, constipation, dizziness, abdominal pain, skin rash, fevers,  chills, night sweats, weight loss, swollen lymph nodes, joint swelling, chest pain, shortness of breath, mood changes. POSITIVE muscle aches, body aches   Objective  Blood pressure (!) 142/82, pulse (!) 112, height 5\' 7"  (1.702 m), weight 205 lb (93 kg), SpO2 98 %.   General: No apparent distress alert and oriented x3 mood and affect normal, dressed appropriately.  HEENT: Pupils equal, extraocular movements intact  Respiratory: Patient's speak in full sentences and does not appear short of breath  Cardiovascular: No lower extremity edema, non tender, no erythema  Gait normal with good balance and coordination.   Back -tightness noted in the parascapular region.  Seems to be more on the right than the left.  Tightness noted in the scalenes on the right side of the neck also noted. Low back exam does show some right sacroiliac pain as well.  Severely tender to palpation.  Mild worsening pain with Corky Sox.  Osteopathic findings  C2 flexed rotated and side bent right T3 extended rotated and side bent right inhaled rib T7 extended rotated and side bent left L2 flexed rotated and side bent right Sacrum right on right       Assessment and Plan: Degenerative disc disease, cervical Known degenerative disc disease and tightness of the neck.  Has been responsive to the manipulation somewhat.  Nothing that stopping her from activity.  Continuing her other medications including her lidocaine patch, Skelaxin as well as pain medications when needed.  Hopefully the manipulation will continue to help  somewhat we can decrease medication at some point.  Follow-up with me again in 2 months     Nonallopathic problems  Decision today to treat with OMT was based on Physical Exam  After verbal consent patient was treated with HVLA, ME, FPR techniques in cervical, rib, thoracic, lumbar, and sacral  areas  Patient tolerated the procedure well with improvement in symptoms  Patient given exercises,  stretches and lifestyle modifications  See medications in patient instructions if given  Patient will follow up in 4-8 weeks      The above documentation has been reviewed and is accurate and complete Lyndal Pulley, DO       Note: This dictation was prepared with Dragon dictation along with smaller phrase technology. Any transcriptional errors that result from this process are unintentional.

## 2021-06-07 ENCOUNTER — Other Ambulatory Visit: Payer: Self-pay

## 2021-06-07 ENCOUNTER — Encounter: Payer: Self-pay | Admitting: Family Medicine

## 2021-06-07 ENCOUNTER — Ambulatory Visit: Payer: 59 | Admitting: Family Medicine

## 2021-06-07 VITALS — BP 142/82 | HR 112 | Ht 67.0 in | Wt 205.0 lb

## 2021-06-07 DIAGNOSIS — M9901 Segmental and somatic dysfunction of cervical region: Secondary | ICD-10-CM

## 2021-06-07 DIAGNOSIS — M5030 Other cervical disc degeneration, unspecified cervical region: Secondary | ICD-10-CM | POA: Diagnosis not present

## 2021-06-07 DIAGNOSIS — M9908 Segmental and somatic dysfunction of rib cage: Secondary | ICD-10-CM | POA: Diagnosis not present

## 2021-06-07 DIAGNOSIS — M9903 Segmental and somatic dysfunction of lumbar region: Secondary | ICD-10-CM | POA: Diagnosis not present

## 2021-06-07 DIAGNOSIS — M9904 Segmental and somatic dysfunction of sacral region: Secondary | ICD-10-CM | POA: Diagnosis not present

## 2021-06-07 DIAGNOSIS — M9902 Segmental and somatic dysfunction of thoracic region: Secondary | ICD-10-CM | POA: Diagnosis not present

## 2021-06-07 NOTE — Patient Instructions (Addendum)
Good to see you Overall not terrible Find time for yourself [facility] See me again in 2 months

## 2021-06-07 NOTE — Assessment & Plan Note (Signed)
Known degenerative disc disease and tightness of the neck.  Has been responsive to the manipulation somewhat.  Nothing that stopping her from activity.  Continuing her other medications including her lidocaine patch, Skelaxin as well as pain medications when needed.  Hopefully the manipulation will continue to help somewhat we can decrease medication at some point.  Follow-up with me again in 2 months

## 2021-06-12 ENCOUNTER — Other Ambulatory Visit (HOSPITAL_COMMUNITY): Payer: Self-pay

## 2021-06-16 ENCOUNTER — Other Ambulatory Visit (HOSPITAL_COMMUNITY): Payer: Self-pay

## 2021-06-16 ENCOUNTER — Emergency Department (HOSPITAL_BASED_OUTPATIENT_CLINIC_OR_DEPARTMENT_OTHER)
Admission: EM | Admit: 2021-06-16 | Discharge: 2021-06-16 | Disposition: A | Discharge status: Home / Self Care | Payer: 59 | Attending: Emergency Medicine | Admitting: Emergency Medicine

## 2021-06-16 ENCOUNTER — Encounter (HOSPITAL_BASED_OUTPATIENT_CLINIC_OR_DEPARTMENT_OTHER): Payer: Self-pay | Admitting: Emergency Medicine

## 2021-06-16 ENCOUNTER — Other Ambulatory Visit: Payer: Self-pay

## 2021-06-16 ENCOUNTER — Telehealth: Payer: Self-pay | Admitting: Family Medicine

## 2021-06-16 DIAGNOSIS — R000 Tachycardia, unspecified: Secondary | ICD-10-CM | POA: Diagnosis not present

## 2021-06-16 DIAGNOSIS — J45909 Unspecified asthma, uncomplicated: Secondary | ICD-10-CM | POA: Insufficient documentation

## 2021-06-16 DIAGNOSIS — Z79899 Other long term (current) drug therapy: Secondary | ICD-10-CM | POA: Insufficient documentation

## 2021-06-16 DIAGNOSIS — R002 Palpitations: Secondary | ICD-10-CM | POA: Insufficient documentation

## 2021-06-16 MED FILL — Rimegepant Sulfate Tab Disint 75 MG: ORAL | 30 days supply | Qty: 8 | Fill #2 | Status: AC

## 2021-06-16 NOTE — ED Triage Notes (Signed)
Pt presents to ED Pov. Pt c/o tachycardia since 1245. Begin at rest, highest seen at 147. Pt reports feeling nauseous and migraine this morning. Pt reports that she has been weaning off atenolol for the past year. Was taking for migraines.

## 2021-06-16 NOTE — ED Provider Notes (Signed)
[facility] EMERGENCY DEPT Provider Note   CSN: [id number] Arrival date & time: 06/16/21  1616     History Chief Complaint  Patient presents with   Tachycardia    Barbara Wood is a 51 y.o. female.  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 Her apple watch was reading heart rates in the 140s.  Had significant heart racing at the time.  No chest pain or dyspnea. Had migraine earlier, felt like typical migraine, took medication including zyrtec and it improved.  No longer has headache, felt typical of migraines. No fever. No cough, congestion, black or bloody stools, nausea, vomiting or diarrhea.  No chest pain, dyspnea, leg pain or swelling, estrogen, immobilization/surgeries. Had symptoms like this one time before when she was initially diagnosed with thyroid abnormalities.   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     Family History  Problem  Relation Age of Onset   Asthma Sister    Miscarriages / [place] Sister    Depression Sister    Psoriasis Sister    Cancer Mother    Depression Mother    Hashimoto's thyroiditis Mother    Diabetes Mother    Bipolar disorder Mother    Hypertension Father    Ankylosing spondylitis Father    Psoriasis Father    Psoriasis Brother    Migraines Brother    ADD / ADHD Brother    Eczema Brother    Allergies Brother    Psoriasis Maternal Aunt    Psoriasis Sister    Arthritis Sister        psoriatic arthritis    Psoriasis Sister    Rheum arthritis Sister    Depression Sister    Anxiety disorder Sister    Eczema Sister    Psoriasis Brother    ADD / ADHD Daughter    Eczema Daughter    Asthma Daughter     Social History   Tobacco Use   Smoking status: Never   Smokeless tobacco: Never  Vaping Use   Vaping Use: Never used  Substance Use Topics   Alcohol use: Yes    Comment: Rarely   Drug use: Never    Home Medications Prior to Admission medications   Medication Sig Start Date End Date Taking? Authorizing Provider  albuterol (PROVENTIL HFA) 108 (90 Base)  MCG/ACT inhaler Inhale 2 puffs into the lungs every 4 (four) hours as needed for wheezing or shortness of breath. 11/16/19  Yes Cirigliano, Mary K, DO  ARIPiprazole (ABILIFY) 10 MG tablet Take 1 tablet (10 mg total) by mouth daily. 06/01/21  Yes Cirigliano, Mary K, DO  ARIPiprazole (ABILIFY) 5 MG tablet Take 2 tablets (10 mg total) by mouth daily. 04/20/20  Yes Cirigliano, Mary K, DO  Budesonide (RHINOCORT ALLERGY NA) Use 1 spray in each nostril twice daily.   Yes [provider]  budesonide-formoterol (SYMBICORT) 160-4.5 MCG/ACT inhaler as needed. 04/22/19  Yes [provider]  cetirizine (ZYRTEC) 10 MG tablet Take 1 tablet by mouth 2 (two) times a day.   Yes [provider]  diclofenac (VOLTAREN) 75 MG EC tablet TAKE 1 TABLET (75 MG TOTAL) BY MOUTH 2 (TWO) TIMES DAILY AS NEEDED. 02/22/21 02/22/22 Yes  Cirigliano, Garvin Fila, DO  Fluocinolone Acetonide Scalp (DERMA-SMOOTHE/FS SCALP) 0.01 % OIL Apply TID 02/05/20  Yes Cirigliano, Mary K, DO  Galcanezumab-gnlm 120 MG/ML SOAJ INJECT 1 ML INTO THE SKIN EVERY 30 (THIRTY) DAYS. 07/14/20 08/01/21 Yes Cirigliano, Mary K, DO  lidocaine (LIDODERM) 5 % Place 1 patch onto the skin daily. Remove & Discard patch within 12 hours or as directed by MD 07/14/20  Yes Cirigliano, Garvin Fila, DO  liothyronine (CYTOMEL) 5 MCG tablet Take 1 tablet (5 mcg total) by mouth in the morning, at noon, and at bedtime. 06/01/21  Yes Cirigliano, Mary K, DO  lisdexamfetamine (VYVANSE) 50 MG capsule TAKE 1 CAPSULE (50 MG TOTAL) BY MOUTH DAILY. 04/13/21  Yes Libby Maw, MD  metaxalone (SKELAXIN) 800 MG tablet TAKE 1 TABLET (800 MG TOTAL) BY MOUTH 3 (THREE) TIMES DAILY AS NEEDED FOR MUSCLE SPASMS. 02/22/21 02/22/22 Yes Cirigliano, Mary K, DO  montelukast (SINGULAIR) 10 MG tablet TAKE 1 TABLET BY MOUTH AT BEDTIME EVERY DAY 02/15/21 02/15/22 Yes Cirigliano, Mary K, DO  omeprazole (PRILOSEC) 20 MG capsule Take 1 capsule (20 mg total) by mouth daily. 04/06/20  Yes Nandigam, Venia Minks, MD  Rimegepant Sulfate 75 MG TBDP TAKE 1 TABLET BY MOUTH DAILY AS NEEDED 01/25/21 01/25/22 Yes Cirigliano, Mary K, DO  SYNTHROID 125 MCG tablet TAKE 1 TABLET (125 MCG TOTAL) BY MOUTH DAILY BEFORE BREAKFAST. 10/19/20 10/19/21 Yes Cirigliano, Mary K, DO  traMADol (ULTRAM) 50 MG tablet Take 2 tablets (100 mg total) by mouth 2 (two) times daily. 04/27/21  Yes Cirigliano, Mary K, DO  traZODone (DESYREL) 50 MG tablet TAKE 1 & 1/2 TABLETS BY MOUTH ONCE DAILY AT BEDTIME AS NEEDED FOR SLEEP. 01/25/21 01/25/22 Yes Cirigliano, Mary K, DO  Apremilast 10 & 20 & 30 MG TBPK TAKE AS DIRECTED 01/16/21 01/16/22  Tresa Garter, MD  atenolol (TENORMIN) 25 MG tablet TAKE 1 TABLET BY MOUTH TWICE DAILY. 01/25/21 01/25/22  Cirigliano, Garvin Fila, DO  betamethasone dipropionate 0.05 % cream Apply topically 2 (two) times daily. 02/05/20   Ronnald Nian, DO  COVID-19 At Home Antigen Test [facility] COVID-19 HOME TEST) KIT Use as directed Patient taking differently: Use as directed 06/02/21   Jefm Bryant, RPH  promethazine (PHENERGAN) 25 MG tablet Take 1 tablet (25 mg total) by mouth every 6 (six) hours as needed for nausea or vomiting. 07/14/20   Cirigliano, Garvin Fila, DO  Rimegepant Sulfate 75 MG TBDP TAKE 75 MG BY MOUTH DAILY AS NEEDED. 01/25/21 01/25/22  Ronnald Nian, DO    Allergies    Amoxicillin-pot clavulanate, Sertraline, Sulfamethoxazole-trimethoprim, and Triptans  Review of Systems   Review of Systems  Constitutional:  Negative for fever.  Respiratory:  Negative for cough and shortness of breath.   Cardiovascular:  Positive for palpitations. Negative for chest pain.  Gastrointestinal:  Negative for abdominal pain, blood in stool, diarrhea, nausea and vomiting.  Musculoskeletal:  Negative for back pain and neck pain.  Skin:  Negative for rash.  Neurological:  Negative for syncope and headaches (improved).   Physical Exam Updated Vital Signs BP 128/80   Pulse 99   Temp 98.4 F (36.9 C) (Oral)   Resp (!) 25   Ht 5' 7"  (1.702 m)   Wt 91.2 kg   SpO2 100%   BMI 31.48 kg/m   Physical Exam Vitals and nursing note reviewed.  Constitutional:      General: She is not in acute distress.    Appearance: She is well-developed. She is not diaphoretic.  HENT:     Head: Normocephalic and atraumatic.  Eyes:     General: No scleral icterus. Cardiovascular:     Rate and Rhythm: Normal rate and regular rhythm.     Heart sounds: Normal heart sounds. No murmur heard.   No friction rub. No gallop.  Pulmonary:     Effort: Pulmonary effort is normal. No respiratory distress.     Breath sounds: Normal breath sounds. No wheezing or rales.  Abdominal:     General: There is no distension.     Palpations: Abdomen is soft.     Tenderness: There is no abdominal tenderness. There is no guarding.  Musculoskeletal:         General: No tenderness.     Cervical back: Normal range of motion.  Skin:    General: Skin is warm and dry.     Findings: No erythema or rash.  Neurological:     Mental Status: She is alert and oriented to person, place, and time.    ED Results / Procedures / Treatments   Labs (all labs ordered are listed, but only abnormal results are displayed) Labs Reviewed - No data to display  EKG EKG Interpretation  Date/Time:  Friday June 16 2021 16:24:51 EDT Ventricular Rate:  89 PR Interval:  130 QRS Duration: 92 QT Interval:  339 QTC Calculation: 413 R Axis:   56 Text Interpretation: Sinus rhythm No significant change since last tracing Confirmed by Gareth Morgan [phone]) on 06/16/2021 4:53:33 PM  Radiology No results found.  Procedures Procedures   Medications Ordered in ED Medications - No data to display  ED Course  I have reviewed the triage vital signs and the nursing notes.  Pertinent labs & imaging results that were available during my care of the patient were reviewed by me and considered in my medical decision making (see chart for details).    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  On arrival to the emergency department she is now in normal sinus rhythm.  Discussed possibility of labs to evaluate for anemia or electrolyte abnormalities and agree that we have low suspicion for significant abnormalities that would result in change of care today. Low suspicion for PE given no dyspnea, no chest pain, no other risk factors.  Suspect tachyarrhythmia however unable to capture rhythm  in the ED.  Recommend Cardiology follow up.     Final Clinical Impression(s) / ED Diagnoses Final diagnoses:  Palpitations  Tachycardia    Rx / DC Orders ED Discharge Orders     None         Gareth Morgan, MD 06/17/21 [phone]

## 2021-06-16 NOTE — Telephone Encounter (Signed)
Pt called in concerned over her heart rate. She says her heart rate is usually 60-70/per minute and today when she stands up, her heart rate is 140. I transferred her over to the Nurse Triage. She also is experiencing a headache and nausea.

## 2021-06-18 MED FILL — Atenolol Tab 25 MG: ORAL | 90 days supply | Qty: 180 | Fill #0 | Status: AC

## 2021-06-19 ENCOUNTER — Other Ambulatory Visit (HOSPITAL_COMMUNITY): Payer: Self-pay

## 2021-06-20 ENCOUNTER — Other Ambulatory Visit (HOSPITAL_COMMUNITY): Payer: Self-pay

## 2021-06-25 ENCOUNTER — Encounter: Payer: Self-pay | Admitting: Family Medicine

## 2021-06-25 ENCOUNTER — Other Ambulatory Visit: Payer: Self-pay | Admitting: Family Medicine

## 2021-06-25 DIAGNOSIS — F988 Other specified behavioral and emotional disorders with onset usually occurring in childhood and adolescence: Secondary | ICD-10-CM

## 2021-06-26 ENCOUNTER — Other Ambulatory Visit (HOSPITAL_COMMUNITY): Payer: Self-pay

## 2021-06-27 ENCOUNTER — Other Ambulatory Visit: Payer: Self-pay

## 2021-06-28 ENCOUNTER — Other Ambulatory Visit (HOSPITAL_COMMUNITY)
Admission: RE | Admit: 2021-06-28 | Discharge: 2021-06-28 | Disposition: A | Discharge status: Home / Self Care | Payer: 59 | Source: Ambulatory Visit | Attending: Family Medicine | Admitting: Family Medicine

## 2021-06-28 ENCOUNTER — Encounter: Payer: Self-pay | Admitting: Family Medicine

## 2021-06-28 ENCOUNTER — Ambulatory Visit: Payer: 59 | Admitting: Family Medicine

## 2021-06-28 ENCOUNTER — Other Ambulatory Visit (HOSPITAL_COMMUNITY): Payer: Self-pay

## 2021-06-28 VITALS — BP 119/80 | HR 96 | Temp 97.3°F | Ht 67.0 in | Wt 205.6 lb

## 2021-06-28 DIAGNOSIS — R002 Palpitations: Secondary | ICD-10-CM | POA: Diagnosis not present

## 2021-06-28 DIAGNOSIS — L409 Psoriasis, unspecified: Secondary | ICD-10-CM | POA: Diagnosis not present

## 2021-06-28 DIAGNOSIS — R7301 Impaired fasting glucose: Secondary | ICD-10-CM | POA: Diagnosis not present

## 2021-06-28 DIAGNOSIS — F988 Other specified behavioral and emotional disorders with onset usually occurring in childhood and adolescence: Secondary | ICD-10-CM | POA: Diagnosis not present

## 2021-06-28 DIAGNOSIS — M7918 Myalgia, other site: Secondary | ICD-10-CM | POA: Diagnosis not present

## 2021-06-28 DIAGNOSIS — G43909 Migraine, unspecified, not intractable, without status migrainosus: Secondary | ICD-10-CM

## 2021-06-28 DIAGNOSIS — Z124 Encounter for screening for malignant neoplasm of cervix: Secondary | ICD-10-CM | POA: Insufficient documentation

## 2021-06-28 LAB — HEMOGLOBIN A1C: Hgb A1c MFr Bld: 6 % (ref 4.6–6.5)

## 2021-06-28 MED ORDER — PROMETHAZINE HCL 25 MG PO TABS
25.0000 mg | ORAL_TABLET | Freq: Four times a day (QID) | ORAL | 3 refills | Status: DC | PRN
  Filled 2021-06-28: qty 30, 8d supply, fill #0

## 2021-06-28 MED ORDER — HYDROCODONE-ACETAMINOPHEN 5-325 MG PO TABS
1.0000 | ORAL_TABLET | Freq: Four times a day (QID) | ORAL | 0 refills | Status: DC | PRN
  Filled 2021-06-28: qty 30, 8d supply, fill #0

## 2021-06-28 MED ORDER — TRAMADOL HCL 50 MG PO TABS
100.0000 mg | ORAL_TABLET | Freq: Two times a day (BID) | ORAL | 0 refills | Status: DC
  Filled 2021-06-28 – 2021-07-26 (×2): qty 360, 90d supply, fill #0

## 2021-06-28 MED ORDER — LISDEXAMFETAMINE DIMESYLATE 50 MG PO CAPS
50.0000 mg | ORAL_CAPSULE | Freq: Every day | ORAL | 0 refills | Status: DC
  Filled 2021-06-28 – 2021-07-11 (×2): qty 90, 90d supply, fill #0

## 2021-06-28 MED ORDER — BETAMETHASONE DIPROPIONATE 0.05 % EX CREA
TOPICAL_CREAM | Freq: Two times a day (BID) | CUTANEOUS | 5 refills | Status: AC
  Filled 2021-06-28: qty 45, 20d supply, fill #0

## 2021-06-28 MED ORDER — ATENOLOL 25 MG PO TABS
25.0000 mg | ORAL_TABLET | Freq: Two times a day (BID) | ORAL | 3 refills | Status: DC
  Filled 2021-06-28: qty 180, fill #0
  Filled 2021-12-20: qty 180, 90d supply, fill #0
  Filled 2022-04-29: qty 180, 90d supply, fill #1

## 2021-06-28 NOTE — Progress Notes (Signed)
Barbara Wood is a 51 y.o. female  Chief Complaint  Patient presents with   Follow-up    Pt here for her 3 month f/u visit/med refill    HPI: Barbara Wood is a 51 y.o. female patient seen today for routine f/u on chronic medical issues and medical refills. Psoriasis - needs refill of betamethasone cream Migraines - on prophylactic and PRN meds. Needs refill of very PRN percocet 5/367m.  palpitations - needs refill of atenolol. Was seen in ER earlier this month for this, had stopped atenolol.  Due for PAP - will do today. CRC screening UTD Mammo UTD - 12/2020  Believes Tdap is UTD.  Declines Hep C and HIV screening today.  Past Medical History:  Diagnosis Date   Allergy Age 72   Asthma Since age 51  Depression Age 89  Hashimoto's disease    Heart murmur Mitral valve prolapse age 51  Migraine    Psoriasis    Thyroid disease Since age 51   Past Surgical History:  Procedure Laterality Date   BREAST SURGERY  Reduction age 51  CHOLECYSTECTOMY  Age 51  KNEE ARTHROSCOPY W/ LATERAL RELEASE Right    WISDOM TOOTH EXTRACTION      Social History   Socioeconomic History   Marital status: Married    Spouse name: Not on file   Number of children: Not on file   Years of education: Not on file   Highest education level: Not on file  Occupational History   Not on file  Tobacco Use   Smoking status: Never   Smokeless tobacco: Never  Vaping Use   Vaping Use: Never used  Substance and Sexual Activity   Alcohol use: Yes    Comment: Rarely   Drug use: Never   Sexual activity: Yes    Comment: Infertile  Other Topics Concern   Not on file  Social History Narrative   Not on file   Social Determinants of Health   Financial Resource Strain: Not on file  Food Insecurity: Not on file  Transportation Needs: Not on file  Physical Activity: Not on file  Stress: Not on file  Social Connections: Not on file  Intimate Partner Violence: Not on file    Family History   Problem Relation Age of Onset   Asthma Sister    Miscarriages / [place]ister    Depression Sister    Psoriasis Sister    Cancer Mother    Depression Mother    Hashimoto's thyroiditis Mother    Diabetes Mother    Bipolar disorder Mother    Hypertension Father    Ankylosing spondylitis Father    Psoriasis Father    Psoriasis Brother    Migraines Brother    ADD / ADHD Brother    Eczema Brother    Allergies Brother    Psoriasis Maternal Aunt    Psoriasis Sister    Arthritis Sister        psoriatic arthritis    Psoriasis Sister    Rheum arthritis Sister    Depression Sister    Anxiety disorder Sister    Eczema Sister    Psoriasis Brother    ADD / ADHD Daughter    Eczema Daughter    Asthma Daughter      Immunization History  Administered Date(s) Administered   Influenza,inj,Quad PF,6+ Mos 09/30/2019, 09/22/2020   Influenza-Unspecified 11/02/2013   PFIZER(Purple Top)SARS-COV-2 Vaccination 12/29/2019, 01/26/2020, 11/28/2020    Outpatient Encounter  Medications as of 06/28/2021  Medication Sig   albuterol (PROVENTIL HFA) 108 (90 Base) MCG/ACT inhaler Inhale 2 puffs into the lungs every 4 (four) hours as needed for wheezing or shortness of breath.   Apremilast 10 & 20 & 30 MG TBPK TAKE AS DIRECTED   ARIPiprazole (ABILIFY) 10 MG tablet Take 1 tablet (10 mg total) by mouth daily.   ARIPiprazole (ABILIFY) 5 MG tablet Take 2 tablets (10 mg total) by mouth daily.   atenolol (TENORMIN) 25 MG tablet TAKE 1 TABLET BY MOUTH TWICE DAILY.   betamethasone dipropionate 0.05 % cream Apply topically 2 (two) times daily.   Budesonide (RHINOCORT ALLERGY NA) Use 1 spray in each nostril twice daily.   budesonide-formoterol (SYMBICORT) 160-4.5 MCG/ACT inhaler as needed.   cetirizine (ZYRTEC) 10 MG tablet Take 1 tablet by mouth 2 (two) times a day.   diclofenac (VOLTAREN) 75 MG EC tablet TAKE 1 TABLET (75 MG TOTAL) BY MOUTH 2 (TWO) TIMES DAILY AS NEEDED.   Fluocinolone Acetonide Scalp  (DERMA-SMOOTHE/FS SCALP) 0.01 % OIL Apply TID   Galcanezumab-gnlm 120 MG/ML SOAJ INJECT 1 ML INTO THE SKIN EVERY 30 (THIRTY) DAYS.   lidocaine (LIDODERM) 5 % Place 1 patch onto the skin daily. Remove & Discard patch within 12 hours or as directed by MD   liothyronine (CYTOMEL) 5 MCG tablet Take 1 tablet (5 mcg total) by mouth in the morning, at noon, and at bedtime.   lisdexamfetamine (VYVANSE) 50 MG capsule TAKE 1 CAPSULE (50 MG TOTAL) BY MOUTH DAILY.   metaxalone (SKELAXIN) 800 MG tablet TAKE 1 TABLET (800 MG TOTAL) BY MOUTH 3 (THREE) TIMES DAILY AS NEEDED FOR MUSCLE SPASMS.   montelukast (SINGULAIR) 10 MG tablet TAKE 1 TABLET BY MOUTH AT BEDTIME EVERY DAY   omeprazole (PRILOSEC) 20 MG capsule Take 1 capsule (20 mg total) by mouth daily.   promethazine (PHENERGAN) 25 MG tablet Take 1 tablet (25 mg total) by mouth every 6 (six) hours as needed for nausea or vomiting.   Rimegepant Sulfate 75 MG TBDP TAKE 1 TABLET BY MOUTH DAILY AS NEEDED   Rimegepant Sulfate 75 MG TBDP TAKE 75 MG BY MOUTH DAILY AS NEEDED.   SYNTHROID 125 MCG tablet TAKE 1 TABLET (125 MCG TOTAL) BY MOUTH DAILY BEFORE BREAKFAST.   traMADol (ULTRAM) 50 MG tablet Take 2 tablets (100 mg total) by mouth 2 (two) times daily.   traZODone (DESYREL) 50 MG tablet TAKE 1 & 1/2 TABLETS BY MOUTH ONCE DAILY AT BEDTIME AS NEEDED FOR SLEEP.   COVID-19 At Home Antigen Test [facility] COVID-19 HOME TEST) KIT Use as directed (Patient taking differently: Use as directed)   No facility-administered encounter medications on file as of 06/28/2021.     ROS: Pertinent positives and negatives noted in HPI. Remainder of ROS non-contributory    Allergies  Allergen Reactions   Amoxicillin-Pot Clavulanate Nausea And Vomiting   Sertraline Other (See Comments)    hallucinations    Sulfamethoxazole-Trimethoprim Hives   Triptans Other (See Comments)    Chest pain     BP 119/80 (BP Location: Left Arm, Patient Position: Sitting, Cuff Size: Normal)    Pulse 96   Temp (!) 97.3 F (36.3 C) (Temporal)   Ht _0  (1.702 m)   Wt 205 lb 9.6 oz (93.3 kg)   SpO2 98%   BMI 32.20 kg/m  Wt Readings from Last 3 Encounters:  06/28/21 205 lb 9.6 oz (93.3 kg)  06/16/21 201 lb (91.2 kg)  06/07/21 205 lb (93  kg)   Temp Readings from Last 3 Encounters:  06/28/21 (!) 97.3 F (36.3 C) (Temporal)  06/16/21 98.4 F (36.9 C) (Oral)  03/29/21 98.5 F (36.9 C) (Oral)   BP Readings from Last 3 Encounters:  06/28/21 119/80  06/16/21 128/80  06/07/21 (!) 142/82   Pulse Readings from Last 3 Encounters:  06/28/21 96  06/16/21 99  06/07/21 (!) 112     Physical Exam Exam conducted with a chaperone present.  Constitutional:      General: She is not in acute distress.    Appearance: Normal appearance. She is not ill-appearing.  Pulmonary:     Effort: No respiratory distress.  Genitourinary:    General: Normal vulva.     Labia:        Right: No rash, tenderness or lesion.        Left: No rash, tenderness or lesion.      Vagina: No vaginal discharge, erythema, tenderness, bleeding, lesions or prolapsed vaginal walls.     Cervix: No cervical motion tenderness, discharge, friability, lesion, erythema or cervical bleeding.     Uterus: Not deviated, not enlarged, not fixed and not tender.      Adnexa: Right adnexa normal and left adnexa normal.  Neurological:     Mental Status: She is alert and oriented to person, place, and time.  Psychiatric:        Mood and Affect: Mood normal.        Behavior: Behavior normal.     A/P:  1. Myofascial pain - stable overall Refill:  - traMADol (ULTRAM) 50 MG tablet; Take 2 tablets (100 mg total) by mouth 2 (two) times daily.  Dispense: 360 tablet; Refill: 0  2. Migraine without status migrainosus, not intractable, unspecified migraine type - stable - on emgality and nurtec Refil: - HYDROcodone-acetaminophen (NORCO) 5-325 MG tablet; Take 1 tablet by mouth every 6 (six) hours as needed for moderate  pain.  Dispense: 30 tablet; Refill: 0 - pt takes very PRN, database reviewed  Refill: - promethazine (PHENERGAN) 25 MG tablet; Take 1 tablet (25 mg total) by mouth every 6 (six) hours as needed for nausea or vomiting.  Dispense: 30 tablet; Refill: 3  3. Psoriasis - controlled Refill: - betamethasone dipropionate 0.05 % cream; Apply topically 2 (two) times daily.  Dispense: 45 g; Refill: 5  4. Attention deficit disorder, unspecified hyperactivity presence - controlled and current med, dose - database reviewed and appropriate Refill: - lisdexamfetamine (VYVANSE) 50 MG capsule; TAKE 1 CAPSULE (50 MG TOTAL) BY MOUTH DAILY.  Dispense: 90 capsule; Refill: 0  5. Screening for cervical cancer - Cytology - PAP( [place])  6. IFG (impaired fasting glucose) - Hemoglobin A1c  7. Palpitations Refill: - atenolol (TENORMIN) 25 MG tablet; Take 1 tablet (25 mg total) by mouth 2 (two) times daily.  Dispense: 180 tablet; Refill: 3   This visit occurred during the SARS-CoV-2 public health emergency.  Safety protocols were in place, including screening questions prior to the visit, additional usage of staff PPE, and extensive cleaning of exam room while observing appropriate contact time as indicated for disinfecting solutions.

## 2021-06-29 ENCOUNTER — Other Ambulatory Visit (HOSPITAL_COMMUNITY): Payer: Self-pay

## 2021-06-30 LAB — CYTOLOGY - PAP
Comment: NEGATIVE
Diagnosis: NEGATIVE
High risk HPV: NEGATIVE

## 2021-07-11 ENCOUNTER — Other Ambulatory Visit: Payer: Self-pay | Admitting: Family Medicine

## 2021-07-11 ENCOUNTER — Other Ambulatory Visit (HOSPITAL_COMMUNITY): Payer: Self-pay

## 2021-07-11 DIAGNOSIS — F988 Other specified behavioral and emotional disorders with onset usually occurring in childhood and adolescence: Secondary | ICD-10-CM

## 2021-07-18 DIAGNOSIS — H5212 Myopia, left eye: Secondary | ICD-10-CM | POA: Diagnosis not present

## 2021-07-18 DIAGNOSIS — H52223 Regular astigmatism, bilateral: Secondary | ICD-10-CM | POA: Diagnosis not present

## 2021-07-18 DIAGNOSIS — H5201 Hypermetropia, right eye: Secondary | ICD-10-CM | POA: Diagnosis not present

## 2021-07-18 DIAGNOSIS — H524 Presbyopia: Secondary | ICD-10-CM | POA: Diagnosis not present

## 2021-07-20 ENCOUNTER — Other Ambulatory Visit (HOSPITAL_COMMUNITY): Payer: Self-pay

## 2021-07-20 ENCOUNTER — Other Ambulatory Visit: Payer: Self-pay | Admitting: Family Medicine

## 2021-07-20 DIAGNOSIS — G43909 Migraine, unspecified, not intractable, without status migrainosus: Secondary | ICD-10-CM

## 2021-07-20 MED ORDER — EMGALITY 120 MG/ML ~~LOC~~ SOAJ
120.0000 mg | SUBCUTANEOUS | 3 refills | Status: DC
  Filled 2021-07-20: qty 3, 90d supply, fill #0
  Filled 2021-10-03 – 2021-10-24 (×2): qty 3, 90d supply, fill #1
  Filled 2021-12-28 – 2022-01-22 (×2): qty 3, 90d supply, fill #2
  Filled 2022-03-23: qty 1, 30d supply, fill #3
  Filled 2022-04-19 – 2022-04-25 (×3): qty 3, 90d supply, fill #3
  Filled 2022-04-27 (×3): qty 1, 30d supply, fill #3
  Filled 2022-05-02 – 2022-05-24 (×2): qty 1, 30d supply, fill #4
  Filled 2022-06-21: qty 1, 30d supply, fill #5

## 2021-07-20 MED FILL — Rimegepant Sulfate Tab Disint 75 MG: ORAL | 30 days supply | Qty: 8 | Fill #3 | Status: AC

## 2021-07-26 ENCOUNTER — Other Ambulatory Visit (HOSPITAL_COMMUNITY): Payer: Self-pay

## 2021-07-31 ENCOUNTER — Other Ambulatory Visit (HOSPITAL_COMMUNITY): Payer: Self-pay

## 2021-07-31 MED FILL — Levothyroxine Sodium Tab 125 MCG: ORAL | 90 days supply | Qty: 90 | Fill #1 | Status: AC

## 2021-08-08 NOTE — Progress Notes (Signed)
Barbara Wood [facility] Phone: [phone] Subjective:   [name], am serving as a scribe for Dr. Hulan Saas.  This visit occurred during the SARS-CoV-2 public health emergency.  Safety protocols were in place, including screening questions prior to the visit, additional usage of staff PPE, and extensive cleaning of exam room while observing appropriate contact time as indicated for disinfecting solutions.   I'm seeing this patient by the request  of:  Cirigliano, Garvin Fila, DO  CC: Neck pain and hip pain follow-up  [id number]  Sharry Gervasio is a 51 y.o. female coming in with complaint of back and neck pain. OMT 06/07/2021. Patient states that she is having a migraine and neck pain and stiffness.   Medications patient has been prescribed: None  Taking:  Since we have seen patient was seen in the emergency room for palpitations.     X-rays of the hip taken in April 2022 were independently visualized by me showing no significant bony abnormality.  There was some mild heterotrophic ossification subjacent to the greater trochanteric area.  Reviewed prior external information including notes and imaging from previsou exam, outside providers and external EMR if available.  This includes patient's most recent visit with primary care physician  As well as notes that were available from care everywhere and other healthcare systems.  Past medical history, social, surgical and family history all reviewed in electronic medical record.  No pertanent information unless stated regarding to the chief complaint.   Past Medical History:  Diagnosis Date   Allergy Age 63   Asthma Since age 89   Depression Age 83   Hashimoto's disease    Heart murmur Mitral valve prolapse age 40   Migraine    Psoriasis    Thyroid disease Since age 75    Allergies  Allergen Reactions   Amoxicillin-Pot Clavulanate Nausea And Vomiting   Sertraline  Other (See Comments)    hallucinations    Sulfamethoxazole-Trimethoprim Hives   Triptans Other (See Comments)    Chest pain      Review of Systems:  No visual changes, nausea, vomiting, diarrhea, constipation, dizziness, abdominal pain, skin rash, fevers, chills, night sweats, weight loss, swollen lymph nodes,  joint swelling, chest pain, shortness of breath, mood changes. POSITIVE muscle aches, body aches, headache  Objective  Blood pressure 110/80, pulse 98, height '5\' 7"'$  (1.702 m), weight 200 lb (90.7 kg), SpO2 98 %.   General: No apparent distress alert and oriented x3 mood and affect normal, dressed appropriately.  HEENT: Pupils equal, extraocular movements intact  Respiratory: Patient's speak in full sentences and does not appear short of breath  Cardiovascular: No lower extremity edema, non tender, no erythema  Low back exam does have some mild loss lordosis.  Some tenderness to palpation in the paraspinal musculature.  Osteopathic findings  C3 flexed rotated and side bent right C5 flexed rotated and side bent left T3 extended rotated and side bent right inhaled rib L2 flexed rotated and side bent right L3 flexed rotated and side bent left Sacrum right on right       Assessment and Plan:  Degenerative disc disease, cervical History of degenerative disc disease of the cervical spine.  In addition to this patient also has a migraine noted today.  Patient to restart Toradol and Depo-Medrol.  Warned the potential side effects of exercises.  Follow-up again in 6 to 8 weeks.  Migraine Toradol and Depo-Medrol today.  Patient  has different medications for breakthrough.  Lots of stress noted today.  Patient feels like she is 57 support system.   Nonallopathic problems  Decision today to treat with OMT was based on Physical Exam  After verbal consent patient was treated with HVLA, ME, FPR techniques in cervical, rib, thoracic, lumbar, and sacral  areas  Patient tolerated  the procedure well with improvement in symptoms  Patient given exercises, stretches and lifestyle modifications  See medications in patient instructions if given  Patient will follow up in 4-8 weeks      The above documentation has been reviewed and is accurate and complete Lyndal Pulley, DO       Note: This dictation was prepared with Dragon dictation along with smaller phrase technology. Any transcriptional errors that result from this process are unintentional.

## 2021-08-09 ENCOUNTER — Encounter: Payer: Self-pay | Admitting: Family Medicine

## 2021-08-09 ENCOUNTER — Other Ambulatory Visit: Payer: Self-pay

## 2021-08-09 ENCOUNTER — Ambulatory Visit: Payer: 59 | Admitting: Family Medicine

## 2021-08-09 VITALS — BP 110/80 | HR 98 | Ht 67.0 in | Wt 200.0 lb

## 2021-08-09 DIAGNOSIS — M9902 Segmental and somatic dysfunction of thoracic region: Secondary | ICD-10-CM

## 2021-08-09 DIAGNOSIS — M9901 Segmental and somatic dysfunction of cervical region: Secondary | ICD-10-CM

## 2021-08-09 DIAGNOSIS — M9903 Segmental and somatic dysfunction of lumbar region: Secondary | ICD-10-CM | POA: Diagnosis not present

## 2021-08-09 DIAGNOSIS — M5030 Other cervical disc degeneration, unspecified cervical region: Secondary | ICD-10-CM | POA: Diagnosis not present

## 2021-08-09 DIAGNOSIS — M9908 Segmental and somatic dysfunction of rib cage: Secondary | ICD-10-CM

## 2021-08-09 DIAGNOSIS — M9904 Segmental and somatic dysfunction of sacral region: Secondary | ICD-10-CM

## 2021-08-09 DIAGNOSIS — G43909 Migraine, unspecified, not intractable, without status migrainosus: Secondary | ICD-10-CM | POA: Diagnosis not present

## 2021-08-09 MED ORDER — METHYLPREDNISOLONE ACETATE 40 MG/ML IJ SUSP
40.0000 mg | Freq: Once | INTRAMUSCULAR | Status: AC
  Administered 2021-08-09: 40 mg via INTRAMUSCULAR

## 2021-08-09 MED ORDER — KETOROLAC TROMETHAMINE 30 MG/ML IJ SOLN
30.0000 mg | Freq: Once | INTRAMUSCULAR | Status: AC
  Administered 2021-08-09: 30 mg via INTRAMUSCULAR

## 2021-08-09 NOTE — Assessment & Plan Note (Signed)
History of degenerative disc disease of the cervical spine.  In addition to this patient also has a migraine noted today.  Patient to restart Toradol and Depo-Medrol.  Warned the potential side effects of exercises.  Follow-up again in 6 to 8 weeks.

## 2021-08-09 NOTE — Assessment & Plan Note (Signed)
Toradol and Depo-Medrol today.  Patient has different medications for breakthrough.  Lots of stress noted today.  Patient feels like she is 59 support system.

## 2021-08-09 NOTE — Patient Instructions (Signed)
Injections in back side Tight musculature- may need muscle relaxer See me in 4 weeks

## 2021-08-15 ENCOUNTER — Other Ambulatory Visit (HOSPITAL_COMMUNITY): Payer: Self-pay

## 2021-08-15 MED FILL — Trazodone HCl Tab 50 MG: ORAL | 90 days supply | Qty: 135 | Fill #1 | Status: AC

## 2021-08-20 MED FILL — Rimegepant Sulfate Tab Disint 75 MG: ORAL | 30 days supply | Qty: 8 | Fill #4 | Status: AC

## 2021-08-20 MED FILL — Montelukast Sodium Tab 10 MG (Base Equiv): ORAL | 90 days supply | Qty: 90 | Fill #1 | Status: AC

## 2021-08-21 ENCOUNTER — Other Ambulatory Visit (HOSPITAL_COMMUNITY): Payer: Self-pay

## 2021-08-23 ENCOUNTER — Encounter: Payer: Self-pay | Admitting: Internal Medicine

## 2021-08-23 ENCOUNTER — Other Ambulatory Visit (HOSPITAL_COMMUNITY): Payer: Self-pay

## 2021-08-23 ENCOUNTER — Other Ambulatory Visit: Payer: Self-pay

## 2021-08-23 ENCOUNTER — Ambulatory Visit: Payer: 59 | Admitting: Internal Medicine

## 2021-08-23 VITALS — BP 120/80 | HR 102 | Temp 98.1°F | Ht 67.0 in | Wt 204.7 lb

## 2021-08-23 DIAGNOSIS — L409 Psoriasis, unspecified: Secondary | ICD-10-CM

## 2021-08-23 DIAGNOSIS — E063 Autoimmune thyroiditis: Secondary | ICD-10-CM | POA: Diagnosis not present

## 2021-08-23 DIAGNOSIS — F902 Attention-deficit hyperactivity disorder, combined type: Secondary | ICD-10-CM

## 2021-08-23 DIAGNOSIS — R7302 Impaired glucose tolerance (oral): Secondary | ICD-10-CM | POA: Diagnosis not present

## 2021-08-23 DIAGNOSIS — G43909 Migraine, unspecified, not intractable, without status migrainosus: Secondary | ICD-10-CM

## 2021-08-23 DIAGNOSIS — B0229 Other postherpetic nervous system involvement: Secondary | ICD-10-CM | POA: Diagnosis not present

## 2021-08-23 DIAGNOSIS — M5030 Other cervical disc degeneration, unspecified cervical region: Secondary | ICD-10-CM | POA: Diagnosis not present

## 2021-08-23 DIAGNOSIS — M7918 Myalgia, other site: Secondary | ICD-10-CM | POA: Diagnosis not present

## 2021-08-23 DIAGNOSIS — M7989 Other specified soft tissue disorders: Secondary | ICD-10-CM

## 2021-08-23 DIAGNOSIS — M79643 Pain in unspecified hand: Secondary | ICD-10-CM

## 2021-08-23 MED ORDER — LIDOCAINE 5 % EX PTCH
1.0000 | MEDICATED_PATCH | CUTANEOUS | 2 refills | Status: DC
  Filled 2021-08-23: qty 30, 30d supply, fill #0

## 2021-08-23 NOTE — Progress Notes (Signed)
Established Patient Office Visit     This visit occurred during the SARS-CoV-2 public health emergency.  Safety protocols were in place, including screening questions prior to the visit, additional usage of staff PPE, and extensive cleaning of exam room while observing appropriate contact time as indicated for disinfecting solutions.    CC/Reason for Visit: Establish care, discuss chronic medical conditions  HPI: Barbara Wood is a 51 y.o. female who is coming in today for the above mentioned reasons. Past Medical History is significant for: ADHD on stable dose of Vyvanse for approximately 9 years, she has a very significant history of migraines that is relatively well controlled on Emgality and [name].  She has myofascial pain and cervical disc disease gets OMT by Dr. Gardenia Phlegm she takes tramadol daily and has a very sporadic prescription for Norco for migraine relief when the [name] and Emgality do not take care of it.  She had a colonoscopy in 2016 and is a 10-year callback.  She is due for her COVID booster.  She also has a history of impaired glucose tolerance with an A1c of 6 back in June.  She does not smoke, she drinks alcohol occasionally, she has allergies to sulfa which cause a rash, Zoloft caused upset stomach and hallucinations, Augmentin causes upset stomach.  She has no acute concerns today.   Past Medical/Surgical History: Past Medical History:  Diagnosis Date   Allergy Age 28   Asthma Since age 65   Depression Age 89   Hashimoto's disease    Heart murmur Mitral valve prolapse age 42   Migraine    Psoriasis    Thyroid disease Since age 89    Past Surgical History:  Procedure Laterality Date   BREAST SURGERY  Reduction age 71   CHOLECYSTECTOMY  Age 89   KNEE ARTHROSCOPY W/ LATERAL RELEASE Right    WISDOM TOOTH EXTRACTION      Social History:  reports that she has never smoked. She has never used smokeless tobacco. She reports current alcohol use. She reports  that she does not use drugs.  Allergies: Allergies  Allergen Reactions   Amoxicillin-Pot Clavulanate Nausea And Vomiting   Sertraline Other (See Comments)    hallucinations    Sulfamethoxazole-Trimethoprim Hives   Triptans Other (See Comments)    Chest pain     Family History:  Family History  Problem Relation Age of Onset   Asthma Sister    Miscarriages / [place] Sister    Depression Sister    Psoriasis Sister    Cancer Mother    Depression Mother    Hashimoto's thyroiditis Mother    Diabetes Mother    Bipolar disorder Mother    Hypertension Father    Ankylosing spondylitis Father    Psoriasis Father    Psoriasis Brother    Migraines Brother    ADD / ADHD Brother    Eczema Brother    Allergies Brother    Psoriasis Maternal Aunt    Psoriasis Sister    Arthritis Sister        psoriatic arthritis    Psoriasis Sister    Rheum arthritis Sister    Depression Sister    Anxiety disorder Sister    Eczema Sister    Psoriasis Brother    ADD / ADHD Daughter    Eczema Daughter    Asthma Daughter      Current Outpatient Medications:    albuterol (PROVENTIL HFA) 108 (90 Base) MCG/ACT inhaler, Inhale 2  puffs into the lungs every 4 (four) hours as needed for wheezing or shortness of breath., Disp: 8 g, Rfl: 3   ARIPiprazole (ABILIFY) 10 MG tablet, Take 1 tablet (10 mg total) by mouth daily., Disp: 90 tablet, Rfl: 3   ARIPiprazole (ABILIFY) 5 MG tablet, Take 2 tablets (10 mg total) by mouth daily., Disp: 180 tablet, Rfl: 3   atenolol (TENORMIN) 25 MG tablet, Take 1 tablet (25 mg total) by mouth 2 (two) times daily., Disp: 180 tablet, Rfl: 3   betamethasone dipropionate 0.05 % cream, Apply topically 2 (two) times daily., Disp: 45 g, Rfl: 5   Budesonide (RHINOCORT ALLERGY NA), Use 1 spray in each nostril twice daily., Disp: , Rfl:    budesonide-formoterol (SYMBICORT) 160-4.5 MCG/ACT inhaler, as needed., Disp: , Rfl:    cetirizine (ZYRTEC) 10 MG tablet, Take 1 tablet by  mouth 2 (two) times a day., Disp: , Rfl:    Cholecalciferol (VITAMIN D3) 250 MCG (10000 UT) capsule, Take 10,000 Units by mouth daily., Disp: , Rfl:    COVID-19 At Home Antigen Test (CARESTART COVID-19 HOME TEST) KIT, Use as directed (Patient taking differently: Use as directed), Disp: 4 each, Rfl: 0   diclofenac (VOLTAREN) 75 MG EC tablet, TAKE 1 TABLET (75 MG TOTAL) BY MOUTH 2 (TWO) TIMES DAILY AS NEEDED., Disp: 60 tablet, Rfl: 2   Fluocinolone Acetonide Scalp (DERMA-SMOOTHE/FS SCALP) 0.01 % OIL, Apply TID, Disp: 118.28 mL, Rfl: 5   Galcanezumab-gnlm (EMGALITY) 120 MG/ML SOAJ, Inject 120 mg into the skin every 30 (thirty) days., Disp: 3 mL, Rfl: 3   HYDROcodone-acetaminophen (NORCO) 5-325 MG tablet, Take 1 tablet by mouth every 6 (six) hours as needed for moderate pain., Disp: 30 tablet, Rfl: 0   liothyronine (CYTOMEL) 5 MCG tablet, Take 1 tablet (5 mcg total) by mouth in the morning, at noon, and at bedtime., Disp: 270 tablet, Rfl: 3   lisdexamfetamine (VYVANSE) 50 MG capsule, TAKE 1 CAPSULE (50 MG TOTAL) BY MOUTH DAILY., Disp: 90 capsule, Rfl: 0   metaxalone (SKELAXIN) 800 MG tablet, TAKE 1 TABLET (800 MG TOTAL) BY MOUTH 3 (THREE) TIMES DAILY AS NEEDED FOR MUSCLE SPASMS., Disp: 90 tablet, Rfl: 2   montelukast (SINGULAIR) 10 MG tablet, TAKE 1 TABLET BY MOUTH AT BEDTIME EVERY DAY, Disp: 90 tablet, Rfl: 3   omeprazole (PRILOSEC) 20 MG capsule, Take 1 capsule (20 mg total) by mouth daily., Disp: 30 capsule, Rfl: 3   promethazine (PHENERGAN) 25 MG tablet, Take 1 tablet (25 mg total) by mouth every 6 (six) hours as needed for nausea or vomiting., Disp: 30 tablet, Rfl: 3   Rimegepant Sulfate 75 MG TBDP, TAKE 1 TABLET BY MOUTH DAILY AS NEEDED, Disp: 8 tablet, Rfl: 11   SYNTHROID 125 MCG tablet, TAKE 1 TABLET (125 MCG TOTAL) BY MOUTH DAILY BEFORE BREAKFAST., Disp: 90 tablet, Rfl: 3   traMADol (ULTRAM) 50 MG tablet, Take 2 tablets (100 mg total) by mouth 2 (two) times daily., Disp: 360 tablet, Rfl: 0    traZODone (DESYREL) 50 MG tablet, TAKE 1 & 1/2 TABLETS BY MOUTH ONCE DAILY AT BEDTIME AS NEEDED FOR SLEEP., Disp: 135 tablet, Rfl: 3   Apremilast 10 & 20 & 30 MG TBPK, TAKE AS DIRECTED (Patient not taking: Reported on 08/23/2021), Disp: 55 each, Rfl: 0   lidocaine (LIDODERM) 5 %, Place 1 patch onto the skin daily. Remove & Discard patch within 12 hours or as directed by MD, Disp: 30 patch, Rfl: 2  Review of Systems:  Constitutional:  Denies fever, chills, diaphoresis, appetite change and fatigue.  HEENT: Denies photophobia, eye pain, redness, hearing loss, ear pain, congestion, sore throat, rhinorrhea, sneezing, mouth sores, trouble swallowing, neck pain, neck stiffness and tinnitus.   Respiratory: Denies SOB, DOE, cough, chest tightness,  and wheezing.   Cardiovascular: Denies chest pain, palpitations and leg swelling.  Gastrointestinal: Denies nausea, vomiting, abdominal pain, diarrhea, constipation, blood in stool and abdominal distention.  Genitourinary: Denies dysuria, urgency, frequency, hematuria, flank pain and difficulty urinating.  Endocrine: Denies: hot or cold intolerance, sweats, changes in hair or nails, polyuria, polydipsia. Skin: Denies pallor, rash and wound.  Neurological: Denies dizziness, seizures, syncope, weakness, light-headedness, numbness and headaches.  Hematological: Denies adenopathy. Easy bruising, personal or family bleeding history  Psychiatric/Behavioral: Denies suicidal ideation, mood changes, confusion, nervousness, sleep disturbance and agitation    Physical Exam: Vitals:   08/23/21 1428  BP: 120/80  Pulse: (!) 102  Temp: 98.1 F (36.7 C)  TempSrc: Oral  SpO2: 98%  Weight: 204 lb 11.2 oz (92.9 kg)  Height: 5' 7" (1.702 m)    Body mass index is 32.06 kg/m.   Constitutional: NAD, calm, comfortable Eyes: PERRL, lids and conjunctivae normal, wears corrective lenses ENMT: Mucous membranes are moist.  Neck: normal, supple, no masses, no  thyromegaly Respiratory: clear to auscultation bilaterally, no wheezing, no crackles. Normal respiratory effort. No accessory muscle use.  Cardiovascular: Regular rate and rhythm, no murmurs / rubs / gallops. No extremity edema.  Neurologic: Grossly intact and nonfocal Psychiatric: Normal judgment and insight. Alert and oriented x 3. Normal mood.    Impression and Plan:  Hashimoto's disease -TSH was normal at last check in January.  Psoriasis -Uses a steroid cream as needed.  Myofascial pain Degenerative disc disease, cervical -On tramadol, gets OMT by Dr. Tamala Julian, uses Skelaxin occasionally.  IGT (impaired glucose tolerance) -A1c was 6 in June, recheck at next visit.  Attention deficit hyperactivity disorder (ADHD), combined type -Well-controlled on Vyvanse.  Migraine without status migrainosus, not intractable, unspecified migraine type -On Emgality and [name].  Postherpetic neuralgia  - Plan: lidocaine (LIDODERM) 5 %  Time spent: 31 minutes reviewing chart, interviewing and examining patient and formulating plan of care.    Lelon Frohlich, MD [facility]

## 2021-09-12 NOTE — Progress Notes (Signed)
[facility] [address] [place] Phone: [phone] Subjective:   [name], am serving as a scribe for Dr. Hulan Saas. This visit occurred during the SARS-CoV-2 public health emergency.  Safety protocols were in place, including screening questions prior to the visit, additional usage of staff PPE, and extensive cleaning of exam room while observing appropriate contact time as indicated for disinfecting solutions.   I'm seeing this patient by the request  of:  Isaac Bliss, Rayford Halsted, MD  CC: Neck and back pain follow-up  [id number]  Jashauna Peller is a 51 y.o. female coming in with complaint of back and neck pain. OMT on 08/09/2021. Patient states pain is the same. Right hip feels like it's catching in like the labrum and pain in the groin area.  This is a little bit worse than usual.  Patient otherwise has been making good progress.  Feels like she is in a better place overall.  Medications patient has been prescribed: None  Taking:         Reviewed prior external information including notes and imaging from previsou exam, outside providers and external EMR if available.   As well as notes that were available from care everywhere and other healthcare systems.  Past medical history, social, surgical and family history all reviewed in electronic medical record.  No pertanent information unless stated regarding to the chief complaint.   Past Medical History:  Diagnosis Date   Allergy Age 71   Asthma Since age 74   Depression Age 31   Hashimoto's disease    Heart murmur Mitral valve prolapse age 65   Migraine    Psoriasis    Thyroid disease Since age 17    Allergies  Allergen Reactions   Amoxicillin-Pot Clavulanate Nausea And Vomiting   Sertraline Other (See Comments)    hallucinations    Sulfamethoxazole-Trimethoprim Hives   Triptans Other (See Comments)    Chest pain      Review of Systems:  No  headache, visual changes, nausea, vomiting, diarrhea, constipation, dizziness, abdominal pain, skin rash, fevers, chills, night sweats, weight loss, swollen lymph nodes, body aches, joint swelling, chest pain, shortness of breath, mood changes. POSITIVE muscle aches  Objective  Blood pressure 124/68, pulse 100, height '5\' 7"'$  (1.702 m), weight 197 lb (89.4 kg), SpO2 99 %.   General: No apparent distress alert and oriented x3 mood and affect normal, dressed appropriately.  HEENT: Pupils equal, extraocular movements intact  Respiratory: Patient's speak in full sentences and does not appear short of breath  Cardiovascular: No lower extremity edema, non tender, no erythema  Neck exam does have some mild loss of lordosis.  Some tenderness to palpation of the paraspinal musculature.  Patient is low back pain in the right side of the hip shows good range of motion but does have some mild pain with internal rotation.  Negative straight leg test noted.  Osteopathic findings  C2 flexed rotated and side bent right C7 flexed rotated and side bent left T3 extended rotated and side bent right inhaled rib T9 extended rotated and side bent left L2 flexed rotated and side bent right Sacrum right on right       Assessment and Plan:  Degenerative disc disease, cervical Degenerative disc disease of the cervical spine.  Discussed icing regimen and home exercises, discussed which activities to do which wants to avoid.  Increase activity slowly.  Patient now has made improvement overall and is losing  weight.  Encouraged her to continue to do so.  Follow-up with me again in 6 to 8 weeks.  Right hip pain Seems to be somewhat sacroiliac joint as well.  Patient is doing well at the moment.  Encourage her to continue to work on the core strengthening.  Follow-up again in 4 to 8 weeks   Nonallopathic problems  Decision today to treat with OMT was based on Physical Exam  After verbal consent patient was treated  with HVLA, ME, FPR techniques in cervical, rib, thoracic, lumbar, and sacral  areas  Patient tolerated the procedure well with improvement in symptoms  Patient given exercises, stretches and lifestyle modifications  See medications in patient instructions if given  Patient will follow up in 4-8 weeks     The above documentation has been reviewed and is accurate and complete Lyndal Pulley, DO        Note: This dictation was prepared with Dragon dictation along with smaller phrase technology. Any transcriptional errors that result from this process are unintentional.

## 2021-09-13 ENCOUNTER — Other Ambulatory Visit: Payer: Self-pay

## 2021-09-13 ENCOUNTER — Ambulatory Visit: Payer: 59 | Admitting: Family Medicine

## 2021-09-13 VITALS — BP 124/68 | HR 100 | Ht 67.0 in | Wt 197.0 lb

## 2021-09-13 DIAGNOSIS — M9904 Segmental and somatic dysfunction of sacral region: Secondary | ICD-10-CM

## 2021-09-13 DIAGNOSIS — M25551 Pain in right hip: Secondary | ICD-10-CM

## 2021-09-13 DIAGNOSIS — M5030 Other cervical disc degeneration, unspecified cervical region: Secondary | ICD-10-CM

## 2021-09-13 DIAGNOSIS — M9903 Segmental and somatic dysfunction of lumbar region: Secondary | ICD-10-CM | POA: Diagnosis not present

## 2021-09-13 DIAGNOSIS — M9902 Segmental and somatic dysfunction of thoracic region: Secondary | ICD-10-CM

## 2021-09-13 DIAGNOSIS — M9901 Segmental and somatic dysfunction of cervical region: Secondary | ICD-10-CM

## 2021-09-13 DIAGNOSIS — M9908 Segmental and somatic dysfunction of rib cage: Secondary | ICD-10-CM

## 2021-09-13 NOTE — Patient Instructions (Signed)
Good to see you! So glad you're in a good place See you again in 2 months

## 2021-09-14 ENCOUNTER — Other Ambulatory Visit (HOSPITAL_COMMUNITY): Payer: Self-pay

## 2021-09-14 ENCOUNTER — Encounter: Payer: Self-pay | Admitting: Family Medicine

## 2021-09-14 DIAGNOSIS — L400 Psoriasis vulgaris: Secondary | ICD-10-CM | POA: Diagnosis not present

## 2021-09-14 DIAGNOSIS — D2262 Melanocytic nevi of left upper limb, including shoulder: Secondary | ICD-10-CM | POA: Diagnosis not present

## 2021-09-14 DIAGNOSIS — L814 Other melanin hyperpigmentation: Secondary | ICD-10-CM | POA: Diagnosis not present

## 2021-09-14 DIAGNOSIS — D2271 Melanocytic nevi of right lower limb, including hip: Secondary | ICD-10-CM | POA: Diagnosis not present

## 2021-09-14 DIAGNOSIS — Z85828 Personal history of other malignant neoplasm of skin: Secondary | ICD-10-CM | POA: Diagnosis not present

## 2021-09-14 DIAGNOSIS — L0889 Other specified local infections of the skin and subcutaneous tissue: Secondary | ICD-10-CM | POA: Diagnosis not present

## 2021-09-14 DIAGNOSIS — D225 Melanocytic nevi of trunk: Secondary | ICD-10-CM | POA: Diagnosis not present

## 2021-09-14 MED ORDER — BETAMETHASONE DIPROPIONATE 0.05 % EX LOTN
1.0000 "application " | TOPICAL_LOTION | Freq: Every day | CUTANEOUS | 3 refills | Status: AC
  Filled 2021-09-14: qty 120, 30d supply, fill #0
  Filled 2022-01-16: qty 120, 30d supply, fill #1
  Filled 2022-06-06: qty 120, 30d supply, fill #2
  Filled 2022-08-01: qty 120, 30d supply, fill #3

## 2021-09-14 NOTE — Assessment & Plan Note (Signed)
Seems to be somewhat sacroiliac joint as well.  Patient is doing well at the moment.  Encourage her to continue to work on the core strengthening.  Follow-up again in 4 to 8 weeks

## 2021-09-14 NOTE — Assessment & Plan Note (Signed)
Degenerative disc disease of the cervical spine.  Discussed icing regimen and home exercises, discussed which activities to do which wants to avoid.  Increase activity slowly.  Patient now has made improvement overall and is losing weight.  Encouraged her to continue to do so.  Follow-up with me again in 6 to 8 weeks.

## 2021-09-19 ENCOUNTER — Other Ambulatory Visit (HOSPITAL_COMMUNITY): Payer: Self-pay

## 2021-09-19 ENCOUNTER — Encounter: Payer: Self-pay | Admitting: Family Medicine

## 2021-09-19 ENCOUNTER — Telehealth (INDEPENDENT_AMBULATORY_CARE_PROVIDER_SITE_OTHER): Payer: 59 | Admitting: Family Medicine

## 2021-09-19 VITALS — Temp 98.6°F

## 2021-09-19 DIAGNOSIS — U071 COVID-19: Secondary | ICD-10-CM

## 2021-09-19 DIAGNOSIS — J45909 Unspecified asthma, uncomplicated: Secondary | ICD-10-CM

## 2021-09-19 MED ORDER — PREDNISONE 20 MG PO TABS
40.0000 mg | ORAL_TABLET | Freq: Every day | ORAL | 0 refills | Status: DC
  Filled 2021-09-19: qty 8, 4d supply, fill #0

## 2021-09-19 MED ORDER — MOLNUPIRAVIR EUA 200MG CAPSULE
4.0000 | ORAL_CAPSULE | Freq: Two times a day (BID) | ORAL | 0 refills | Status: AC
  Filled 2021-09-19: qty 40, 5d supply, fill #0

## 2021-09-19 MED ORDER — ALBUTEROL SULFATE HFA 108 (90 BASE) MCG/ACT IN AERS
2.0000 | INHALATION_SPRAY | Freq: Four times a day (QID) | RESPIRATORY_TRACT | 0 refills | Status: DC | PRN
Start: 2021-09-19 — End: 2021-10-11
  Filled 2021-09-19: qty 18, 25d supply, fill #0

## 2021-09-19 NOTE — Patient Instructions (Addendum)
HOME CARE TIPS:  -I sent the medication(s) we discussed to your pharmacy: Meds ordered this encounter  Medications   molnupiravir EUA (LAGEVRIO) 200 mg CAPS capsule    Sig: Take 4 capsules (800 mg total) by mouth 2 (two) times daily for 5 days.    Dispense:  40 capsule    Refill:  0   albuterol (PROAIR HFA) 108 (90 Base) MCG/ACT inhaler    Sig: Inhale 2 puffs into the lungs every 6 (six) hours as needed for wheezing or shortness of breath.    Dispense:  1 each    Refill:  0   predniSONE (DELTASONE) 20 MG tablet    Sig: Take 2 tablets (40 mg total) by mouth daily with breakfast.    Dispense:  8 tablet    Refill:  0     -I sent in the [place] treatment or referral you requested per our discussion. Please see the information provided below and discuss further with the pharmacist/treatment team.   - there is a chance of rebound illness after finishing your treatment. If you become sick again please isolate for an additional 5 days.    -can use tylenol if needed for fevers, aches and pains per instructions  -can use nasal saline a few times per day if you have nasal congestion; sometimes  a short course of Afrin nasal spray for 3 days can help with symptoms as well  -stay hydrated, drink plenty of fluids and eat small healthy meals - avoid dairy  -can take 1000 IU (64mg) Vit D3 and 100-500 mg of Vit C daily per instructions  -If the Covid test is positive, [facility] for more information on home care, transmission and treatment for COVID19  -follow up with your doctor in 2-3 days unless improving and feeling better  -stay home while sick, except to seek medical care. If you have COVID19, ideally it would be best to stay home for a full 10 days since the onset of symptoms PLUS one day of no fever and feeling better. Wear a good mask that fits snugly (such as N95 or KN95) if around others to reduce the risk of transmission.  It was nice to meet you today, and I really  hope you are feeling better soon. I help [place] out with telemedicine visits on Tuesdays and Thursdays and am available for visits on those days. If you have any concerns or questions following this visit please schedule a follow up visit with your Primary Care doctor or seek care at a local urgent care clinic to avoid delays in care.    Seek in person care or schedule a follow up video visit promptly if your symptoms worsen, new concerns arise or you are not improving with treatment. Call 911 and/or seek emergency care if your symptoms are severe or life threatening.    Fact Sheet for Patients And Caregivers Emergency Use Authorization (EUA) Of LAGEVRIOT (molnupiravir) capsules For Coronavirus Disease 2019 (COVID-19)  What is the most important information I should know about LAGEVRIO? LAGEVRIO may cause serious side effects, including: ? LAGEVRIO may cause harm to your unborn baby. It is not known if LAGEVRIO will harm your baby if you take LAGEVRIO during pregnancy. o LAGEVRIO is not recommended for use in pregnancy. o LAGEVRIO has not been studied in pregnancy. LAGEVRIO was studied in pregnant animals only. When LAGEVRIO was given to pregnant animals, LAGEVRIO caused harm to their unborn babies. o You and your healthcare provider  may decide that you should take LAGEVRIO during pregnancy if there are no other COVID-19 treatment options approved or authorized by the FDA that are accessible or clinically appropriate for you. o If you and your healthcare provider decide that you should take LAGEVRIO during pregnancy, you and your healthcare provider should discuss the known and potential benefits and the potential risks of taking LAGEVRIO during pregnancy. For individuals who are able to become pregnant: ? You should use a reliable method of birth control (contraception) consistently and correctly during treatment with LAGEVRIO and for 4 days after the last dose of LAGEVRIO. Talk  to your healthcare provider about reliable birth control methods. ? Before starting treatment with [facility] your healthcare provider may do a pregnancy test to see if you are pregnant before starting treatment with LAGEVRIO. ? Tell your healthcare provider right away if you become pregnant or think you may be pregnant during treatment with LAGEVRIO. Pregnancy Surveillance Program: ? There is a pregnancy surveillance program for individuals who take LAGEVRIO during pregnancy. The purpose of this program is to collect information about the health of you and your baby. Talk to your healthcare provider about how to take part in this program. ? If you take LAGEVRIO during pregnancy and you agree to participate in the pregnancy surveillance program and allow your healthcare provider to share your information with [place], then your healthcare provider will report your use of [place] during pregnancy to [place]. by calling [phone] or [email or web address]. For individuals who are sexually active with partners who are able to become pregnant: ? It is not known if LAGEVRIO can affect sperm. While the risk is regarded as low, animal studies to fully assess the potential for LAGEVRIO to affect the babies of males treated with LAGEVRIO have not been completed. A reliable method of birth control (contraception) should be used consistently and correctly during treatment with LAGEVRIO and for at least 3 months after the last dose. The risk to sperm beyond 3 months is not known. Studies to understand the risk to sperm beyond 3 months are ongoing. Talk to your healthcare provider about reliable birth control methods. Talk to your healthcare provider if you have questions or concerns about how LAGEVRIO may affect sperm. You are being given this fact sheet because your healthcare provider believes it is necessary to provide you with LAGEVRIO for the treatment of adults  with mild-to-moderate coronavirus disease 2019 (COVID-19) with positive results of direct SARS-CoV-2 viral testing, and who are at high risk for progression to severe COVID-19 including hospitalization or death, and for whom other COVID-19 treatment options approved or authorized by the FDA are not accessible or clinically appropriate. The U.S. Food and Drug Administration (FDA) has issued an Emergency Use Authorization (EUA) to make LAGEVRIO available during the COVID-19 pandemic (for more details about an EUA please see "What is an Emergency Use Authorization?" at the end of this document). LAGEVRIO is not an FDA-approved medicine in the [place]. Read this Fact Sheet for information about LAGEVRIO. Talk to your healthcare provider about your options if you have any questions. It is your choice to take LAGEVRIO.  What is COVID-19? COVID-19 is caused by a virus called a coronavirus. You can get COVID-19 through close contact with another person who has the virus. COVID-19 illnesses have ranged from very mild-to-severe, including illness resulting in death. While information so far suggests that most COVID-19 illness is mild, serious illness can happen and may  cause some of your other medical conditions to become worse. Older people and people of all ages with severe, long lasting (chronic) medical conditions like heart disease, lung disease and diabetes, for example seem to be at higher risk of being hospitalized for COVID-19.  What is LAGEVRIO? LAGEVRIO is an investigational medicine used to treat mild-to-moderate COVID-19 in adults: ? with positive results of direct SARS-CoV-2 viral testing, and ? who are at high risk for progression to severe COVID-19 including hospitalization or death, and for whom other COVID-19 treatment options approved or authorized by the FDA are not accessible or clinically appropriate. The FDA has authorized the emergency use of LAGEVRIO for the treatment  of mild-tomoderate COVID-19 in adults under an EUA. For more information on EUA, see the "What is an Emergency Use Authorization (EUA)?" section at the end of this Fact Sheet. LAGEVRIO is not authorized: ? for use in people less than 41 years of age. ? for prevention of COVID-19. ? for people needing hospitalization for COVID-19. ? for use for longer than 5 consecutive days.  What should I tell my healthcare provider before I take LAGEVRIO? Tell your healthcare provider if you: ? Have any allergies ? Are breastfeeding or plan to breastfeed ? Have any serious illnesses ? Are taking any medicines (prescription, over-the-counter, vitamins, or herbal products).  How do I take LAGEVRIO? ? Take LAGEVRIO exactly as your healthcare provider tells you to take it. ? Take 4 capsules of LAGEVRIO every 12 hours (for example, at 8 am and at 8 pm) ? Take LAGEVRIO for 5 days. It is important that you complete the full 5 days of treatment with LAGEVRIO. Do not stop taking LAGEVRIO before you complete the full 5 days of treatment, even if you feel better. ? Take LAGEVRIO with or without food. ? You should stay in isolation for as long as your healthcare provider tells you to. Talk to your healthcare provider if you are not sure about how to properly isolate while you have COVID-19. ? Swallow LAGEVRIO capsules whole. Do not open, break, or crush the capsules. If you cannot swallow capsules whole, tell your healthcare provider. ? What to do if you miss a dose: o If it has been less than 10 hours since the missed dose, take it as soon as you remember o If it has been more than 10 hours since the missed dose, skip the missed dose and take your dose at the next scheduled time. ? Do not double the dose of LAGEVRIO to make up for a missed dose.  What are the important possible side effects of LAGEVRIO? ? See, "What is the most important information I should know about LAGEVRIO?" ? Allergic Reactions.  Allergic reactions can happen in people taking LAGEVRIO, even after only 1 dose. Stop taking LAGEVRIO and call your healthcare provider right away if you get any of the following symptoms of an allergic reaction: o hives o rapid heartbeat o trouble swallowing or breathing o swelling of the mouth, lips, or face o throat tightness o hoarseness o skin rash The most common side effects of LAGEVRIO are: ? diarrhea ? nausea ? dizziness These are not all the possible side effects of LAGEVRIO. Not many people have taken LAGEVRIO. Serious and unexpected side effects may happen. This medicine is still being studied, so it is possible that all of the risks are not known at this time.  What other treatment choices are there?  Veklury (remdesivir) is FDA-approved as an intravenous (  IV) infusion for the treatment of mildto-moderate MBEML-54 in certain adults and children. Talk with your doctor to see if Marijean Heath is appropriate for you. Like LAGEVRIO, FDA may also allow for the emergency use of other medicines to treat people with COVID-19. Go to LacrosseProperties.[email or web address] for more information. It is your choice to be treated or not to be treated with LAGEVRIO. Should you decide not to take it, it will not change your standard medical care.  What if I am breastfeeding? Breastfeeding is not recommended during treatment with LAGEVRIO and for 4 days after the last dose of LAGEVRIO. If you are breastfeeding or plan to breastfeed, talk to your healthcare provider about your options and specific situation before taking LAGEVRIO.  How do I report side effects with LAGEVRIO? Contact your healthcare provider if you have any side effects that bother you or do not go away. Report side effects to FDA MedWatch at SmoothHits.hu or call 1-800-FDA-1088 (1- 404 378 0935).  How should I store [place]? ? Store  LAGEVRIO capsules at room temperature between 37F to 40F (20C to 25C). ? Keep LAGEVRIO and all medicines out of the reach of children and pets. How can I learn more about COVID-19? ? Ask your healthcare provider. ? Visit SeekRooms.co.uk ? Contact your local or state public health department. ? Call [place] at [phone] (toll free in the [place] ? Visit [email or web address].com  What Is an Emergency Use Authorization (EUA)? The [place] FDA has made [place] available under an emergency access mechanism called an Emergency Use Authorization (EUA) The EUA is supported by a Presenter, broadcasting Health and Human Service (HHS) declaration that circumstances exist to justify emergency use of drugs and biological products during the COVID-19 pandemic. LAGEVRIO for the treatment of mild-to-moderate COVID-19 in adults with positive results of direct SARS-CoV-2 viral testing, who are at high risk for progression to severe COVID-19, including hospitalization or death, and for whom alternative COVID-19 treatment options approved or authorized by FDA are not accessible or clinically appropriate, has not undergone the same type of review as an FDA-approved product. In issuing an EUA under the FXJOI-32 public health emergency, the FDA has determined, among other things, that based on the total amount of scientific evidence available including data from adequate and well-controlled clinical trials, if available, it is reasonable to believe that the product may be effective for diagnosing, treating, or preventing COVID-19, or a serious or life-threatening disease or condition caused by COVID-19; that the known and potential benefits of the product, when used to diagnose, treat, or prevent such disease or condition, outweigh the known and potential risks of such product; and that there are no adequate, approved, and available alternatives.  All of these criteria must be met to allow for the  product to be used in the treatment of patients during the COVID-19 pandemic. The EUA for LAGEVRIO is in effect for the duration of the COVID-19 declaration justifying emergency use of LAGEVRIO, unless terminated or revoked (after which LAGEVRIO may no longer be used under the EUA). For patent information: http://rogers.info/ Copyright  2021-2022 [place] [place], [place] and its affiliates. All rights reserved. [id number] Revised: March 2022

## 2021-09-19 NOTE — Progress Notes (Signed)
Virtual Visit via Video Note  I connected with Barbara Wood  on 09/19/21 at  3:20 PM EDT by a video enabled telemedicine application and verified that I am speaking with the correct person using two identifiers.  Location patient: home, [place] Location provider:work or home office Persons participating in the virtual visit: patient, provider  I discussed the limitations of evaluation and management by telemedicine and the availability of in person appointments. The patient expressed understanding and agreed to proceed.   HPI:  Acute telemedicine visit for : -Onset: yesterday, positive test today -requesting refill on albuterol - proair -Symptoms include: cough, congestion, some asthma symptoms with wheezing/sob (alb reduces symptoms about 75%, but using multiple times daily) - reports usually requires prednisone when sick  -Denies:fever, CP, vomiting, diarrhea, inability to eat/drink/get out of bed -Has tried: albuterol proair -Pertinent past medical history:see below -Pertinent medication allergies: Allergies  Allergen Reactions   Amoxicillin-Pot Clavulanate Nausea And Vomiting   Sertraline Other (See Comments)    hallucinations    Sulfamethoxazole-Trimethoprim Hives   Triptans Other (See Comments)    Chest pain   -COVID-19 vaccine status:vaccinated and had one booster -denies any chance pregnancy reports is post menopausal -last labs this spring  ROS: See pertinent positives and negatives per HPI.  Past Medical History:  Diagnosis Date   Allergy Age 89   Asthma Since age 33   Depression Age 38   Hashimoto's disease    Heart murmur Mitral valve prolapse age 21   Migraine    Psoriasis    Thyroid disease Since age 89    Past Surgical History:  Procedure Laterality Date   BREAST SURGERY  Reduction age 79   CHOLECYSTECTOMY  Age 81   KNEE ARTHROSCOPY W/ LATERAL RELEASE Right    WISDOM TOOTH EXTRACTION       Current Outpatient Medications:    albuterol (PROAIR HFA) 108 (90  Base) MCG/ACT inhaler, Inhale 2 puffs into the lungs every 6 (six) hours as needed for wheezing or shortness of breath., Disp: 18 g, Rfl: 0   albuterol (PROVENTIL HFA) 108 (90 Base) MCG/ACT inhaler, Inhale 2 puffs into the lungs every 4 (four) hours as needed for wheezing or shortness of breath., Disp: 8 g, Rfl: 3   ARIPiprazole (ABILIFY) 10 MG tablet, Take 1 tablet (10 mg total) by mouth daily., Disp: 90 tablet, Rfl: 3   ARIPiprazole (ABILIFY) 5 MG tablet, Take 2 tablets (10 mg total) by mouth daily., Disp: 180 tablet, Rfl: 3   atenolol (TENORMIN) 25 MG tablet, Take 1 tablet (25 mg total) by mouth 2 (two) times daily. (Patient taking differently: Take 25 mg by mouth daily.), Disp: 180 tablet, Rfl: 3   betamethasone dipropionate 0.05 % cream, Apply topically 2 (two) times daily., Disp: 45 g, Rfl: 5   betamethasone dipropionate 0.05 % lotion, Apply 1 application topically to the scalp daily. Taper use as able., Disp: 120 mL, Rfl: 3   Budesonide (RHINOCORT ALLERGY NA), Use 1 spray in each nostril twice daily., Disp: , Rfl:    budesonide-formoterol (SYMBICORT) 160-4.5 MCG/ACT inhaler, as needed., Disp: , Rfl:    cetirizine (ZYRTEC) 10 MG tablet, Take 1 tablet by mouth 2 (two) times a day., Disp: , Rfl:    Cholecalciferol (VITAMIN D3) 250 MCG (10000 UT) capsule, Take 10,000 Units by mouth daily., Disp: , Rfl:    COVID-19 At Home Antigen Test (CARESTART COVID-19 HOME TEST) KIT, Use as directed (Patient taking differently: Use as directed), Disp: 4 each, Rfl: 0  diclofenac (VOLTAREN) 75 MG EC tablet, TAKE 1 TABLET (75 MG TOTAL) BY MOUTH 2 (TWO) TIMES DAILY AS NEEDED., Disp: 60 tablet, Rfl: 2   Fluocinolone Acetonide Scalp (DERMA-SMOOTHE/FS SCALP) 0.01 % OIL, Apply TID, Disp: 118.28 mL, Rfl: 5   Galcanezumab-gnlm (EMGALITY) 120 MG/ML SOAJ, Inject 120 mg into the skin every 30 (thirty) days., Disp: 3 mL, Rfl: 3   HYDROcodone-acetaminophen (NORCO) 5-325 MG tablet, Take 1 tablet by mouth every 6 (six) hours as  needed for moderate pain., Disp: 30 tablet, Rfl: 0   lidocaine (LIDODERM) 5 %, Place 1 patch onto the skin daily. Remove & Discard patch within 12 hours or as directed by MD, Disp: 30 patch, Rfl: 2   liothyronine (CYTOMEL) 5 MCG tablet, Take 1 tablet (5 mcg total) by mouth in the morning, at noon, and at bedtime., Disp: 270 tablet, Rfl: 3   lisdexamfetamine (VYVANSE) 50 MG capsule, TAKE 1 CAPSULE (50 MG TOTAL) BY MOUTH DAILY., Disp: 90 capsule, Rfl: 0   metaxalone (SKELAXIN) 800 MG tablet, TAKE 1 TABLET (800 MG TOTAL) BY MOUTH 3 (THREE) TIMES DAILY AS NEEDED FOR MUSCLE SPASMS., Disp: 90 tablet, Rfl: 2   molnupiravir EUA (LAGEVRIO) 200 mg CAPS capsule, Take 4 capsules (800 mg total) by mouth 2 (two) times daily for 5 days., Disp: 40 capsule, Rfl: 0   montelukast (SINGULAIR) 10 MG tablet, TAKE 1 TABLET BY MOUTH AT BEDTIME EVERY DAY, Disp: 90 tablet, Rfl: 3   omeprazole (PRILOSEC) 20 MG capsule, Take 1 capsule (20 mg total) by mouth daily., Disp: 30 capsule, Rfl: 3   predniSONE (DELTASONE) 20 MG tablet, Take 2 tablets (40 mg total) by mouth daily with breakfast., Disp: 8 tablet, Rfl: 0   promethazine (PHENERGAN) 25 MG tablet, Take 1 tablet (25 mg total) by mouth every 6 (six) hours as needed for nausea or vomiting., Disp: 30 tablet, Rfl: 3   Rimegepant Sulfate 75 MG TBDP, TAKE 1 TABLET BY MOUTH DAILY AS NEEDED, Disp: 8 tablet, Rfl: 11   SYNTHROID 125 MCG tablet, TAKE 1 TABLET (125 MCG TOTAL) BY MOUTH DAILY BEFORE BREAKFAST., Disp: 90 tablet, Rfl: 3   traMADol (ULTRAM) 50 MG tablet, Take 2 tablets (100 mg total) by mouth 2 (two) times daily., Disp: 360 tablet, Rfl: 0   traZODone (DESYREL) 50 MG tablet, TAKE 1 & 1/2 TABLETS BY MOUTH ONCE DAILY AT BEDTIME AS NEEDED FOR SLEEP., Disp: 135 tablet, Rfl: 3  EXAM:  VITALS per patient if applicable:  GENERAL: alert, oriented, appears well and in no acute distress  HEENT: atraumatic, conjunttiva clear, no obvious abnormalities on inspection of external nose  and ears  NECK: normal movements of the head and neck  LUNGS: on inspection no signs of respiratory distress, breathing rate appears normal, no obvious gross SOB, gasping or wheezing  CV: no obvious cyanosis  MS: moves all visible extremities without noticeable abnormality  PSYCH/NEURO: pleasant and cooperative, no obvious depression or anxiety, speech and thought processing grossly intact  ASSESSMENT AND PLAN:  Discussed the following assessment and plan:  COVID-19  Asthma, unspecified asthma severity, unspecified whether complicated, unspecified whether persistent   Discussed treatment options (infusions and oral options and risk of drug interactions), ideal treatment window, potential complications, isolation and precautions for COVID-19.  Discussed possibility of rebound with antivirals and the need to reisolate if it should occur for 5 days. Checked for/reviewed any labs done in the last 90 days with GFR listed in HPI if available.  After lengthy discussion, the patient  opted for treatment with molnupiravir due to being higher risk for complications of covid or severe disease and other factors. Discussed EUA status of this drug and the fact that there is preliminary limited knowledge of risks/interactions/side effects per EUA document vs possible benefits and precautions. This information was shared with patient during the visit and also was provided in patient instructions. Also, advised that patient discuss risks/interactions and use with pharmacist/treatment team as well. She also wants a refill on her albuteorl and feels she usually requires  prednisone with resp illnesses. She feel the alb helps some, but she has to use frequently and is not resolving her asthma symptoms so opted for pred burst as well. Other symptomatic care measures summarized in patient instructions.  Advised to seek prompt in person care if worsening, new symptoms arise, or if is not improving with treatment.  Discussed options for inperson care if PCP office not available. Did let this patient know that I only do telemedicine on Tuesdays and Thursdays for [place]. Advised to schedule follow up visit with PCP or [facility] if any further questions or concerns to avoid delays in care.   I discussed the assessment and treatment plan with the patient. The patient was provided an opportunity to ask questions and all were answered. The patient agreed with the plan and demonstrated an understanding of the instructions.     Lucretia Kern, DO

## 2021-09-27 ENCOUNTER — Telehealth: Payer: Self-pay

## 2021-09-27 ENCOUNTER — Other Ambulatory Visit (HOSPITAL_COMMUNITY): Payer: Self-pay

## 2021-09-27 DIAGNOSIS — R059 Cough, unspecified: Secondary | ICD-10-CM | POA: Diagnosis not present

## 2021-09-27 DIAGNOSIS — Z8616 Personal history of COVID-19: Secondary | ICD-10-CM | POA: Diagnosis not present

## 2021-09-27 DIAGNOSIS — Z8701 Personal history of pneumonia (recurrent): Secondary | ICD-10-CM | POA: Diagnosis not present

## 2021-09-27 DIAGNOSIS — R0602 Shortness of breath: Secondary | ICD-10-CM | POA: Diagnosis not present

## 2021-09-27 DIAGNOSIS — R062 Wheezing: Secondary | ICD-10-CM | POA: Diagnosis not present

## 2021-09-27 MED ORDER — DEXAMETHASONE 4 MG PO TABS
4.0000 mg | ORAL_TABLET | Freq: Every day | ORAL | 0 refills | Status: DC
  Filled 2021-09-27: qty 1, 1d supply, fill #0

## 2021-09-27 MED ORDER — AZITHROMYCIN 250 MG PO TABS
ORAL_TABLET | ORAL | 0 refills | Status: DC
  Filled 2021-09-27: qty 6, 5d supply, fill #0

## 2021-09-27 NOTE — Telephone Encounter (Signed)
Patient called requesting a chest X-ray pt is having post Covid cough and is afraid it could be Pneumonia.  I offered pt virtual appt but she declined pt would like a call back.

## 2021-09-27 NOTE — Telephone Encounter (Signed)
Patient is calling again about getting an xray for post covid cough she is having. Patient is nervous that it may have developed into something more serious. Offered patient virtual appointment tomorrow with Dr.Kim, patient refused and stated they would go to urgent care      Good callback number is [phone]

## 2021-09-27 NOTE — Telephone Encounter (Signed)
Patient did state over the phone she would just go to Urgent care

## 2021-09-27 NOTE — Telephone Encounter (Signed)
Noted. FYI 

## 2021-10-03 ENCOUNTER — Other Ambulatory Visit (HOSPITAL_COMMUNITY): Payer: Self-pay

## 2021-10-03 ENCOUNTER — Other Ambulatory Visit: Payer: Self-pay | Admitting: Internal Medicine

## 2021-10-03 DIAGNOSIS — M7918 Myalgia, other site: Secondary | ICD-10-CM

## 2021-10-03 DIAGNOSIS — F988 Other specified behavioral and emotional disorders with onset usually occurring in childhood and adolescence: Secondary | ICD-10-CM

## 2021-10-03 MED FILL — Rimegepant Sulfate Tab Disint 75 MG: ORAL | 30 days supply | Qty: 8 | Fill #5 | Status: CN

## 2021-10-03 NOTE — Telephone Encounter (Signed)
Last filled by Ronnald Nian, DO Last office visit 08/23/21 Okay to fill?

## 2021-10-03 NOTE — Telephone Encounter (Signed)
Patient saw Dr. Jerilee Hoh in August and says that she was told to call the office when she started to run out of lisdexamfetamine (VYVANSE) 50 MG capsule and traMADol (ULTRAM) 50 MG tablet. She is needing a 90 day supply of both medications sent to. She says that she will run out of medication Monday or Tuesday.  She also says that a prior authorization for [name] is going to be required through her insurance. She says that her medical records from her previous doctor will provide documentation in case it is needed.  Patient's contact number is [phone].  Please advise.

## 2021-10-04 ENCOUNTER — Other Ambulatory Visit (HOSPITAL_COMMUNITY): Payer: Self-pay

## 2021-10-04 MED ORDER — LISDEXAMFETAMINE DIMESYLATE 50 MG PO CAPS
50.0000 mg | ORAL_CAPSULE | Freq: Every day | ORAL | 0 refills | Status: DC
  Filled 2021-10-04 – 2021-10-09 (×2): qty 30, 30d supply, fill #0

## 2021-10-04 MED ORDER — LISDEXAMFETAMINE DIMESYLATE 50 MG PO CAPS
50.0000 mg | ORAL_CAPSULE | Freq: Every day | ORAL | 0 refills | Status: DC
  Filled 2021-10-04: qty 30, 30d supply, fill #0
  Filled ????-??-??: fill #0

## 2021-10-04 MED ORDER — TRAMADOL HCL 50 MG PO TABS
100.0000 mg | ORAL_TABLET | Freq: Two times a day (BID) | ORAL | 0 refills | Status: DC
  Filled 2021-10-04: qty 360, 90d supply, fill #0

## 2021-10-04 MED ORDER — LISDEXAMFETAMINE DIMESYLATE 50 MG PO CAPS
50.0000 mg | ORAL_CAPSULE | Freq: Every day | ORAL | 0 refills | Status: DC
Start: 2021-11-04 — End: 2021-10-25
  Filled 2021-10-04: qty 30, 30d supply, fill #0

## 2021-10-04 NOTE — Telephone Encounter (Signed)
Refills sent

## 2021-10-05 ENCOUNTER — Telehealth: Payer: 59 | Admitting: Physician Assistant

## 2021-10-05 ENCOUNTER — Telehealth: Payer: 59 | Admitting: Emergency Medicine

## 2021-10-05 ENCOUNTER — Other Ambulatory Visit: Payer: Self-pay | Admitting: Physician Assistant

## 2021-10-05 ENCOUNTER — Telehealth: Payer: Self-pay | Admitting: Internal Medicine

## 2021-10-05 ENCOUNTER — Telehealth: Payer: Self-pay | Admitting: Emergency Medicine

## 2021-10-05 ENCOUNTER — Other Ambulatory Visit (HOSPITAL_COMMUNITY): Payer: Self-pay

## 2021-10-05 DIAGNOSIS — B370 Candidal stomatitis: Secondary | ICD-10-CM

## 2021-10-05 DIAGNOSIS — B3731 Acute candidiasis of vulva and vagina: Secondary | ICD-10-CM

## 2021-10-05 DIAGNOSIS — B3781 Candidal esophagitis: Secondary | ICD-10-CM

## 2021-10-05 DIAGNOSIS — J029 Acute pharyngitis, unspecified: Secondary | ICD-10-CM

## 2021-10-05 MED ORDER — FLUCONAZOLE 150 MG PO TABS
ORAL_TABLET | ORAL | 0 refills | Status: DC
Start: 2021-10-05 — End: 2021-10-05
  Filled 2021-10-05: qty 8, fill #0

## 2021-10-05 MED ORDER — NYSTATIN 100000 UNIT/ML MT SUSP
5.0000 mL | Freq: Four times a day (QID) | OROMUCOSAL | 0 refills | Status: DC
Start: 2021-10-05 — End: 2021-11-29
  Filled 2021-10-05: qty 473, 24d supply, fill #0

## 2021-10-05 MED ORDER — FLUCONAZOLE 150 MG PO TABS
150.0000 mg | ORAL_TABLET | Freq: Once | ORAL | 0 refills | Status: DC
  Filled 2021-10-05: qty 1, 1d supply, fill #0

## 2021-10-05 MED ORDER — FLUCONAZOLE 100 MG PO TABS
ORAL_TABLET | ORAL | 0 refills | Status: DC
  Filled 2021-10-05: qty 8, 7d supply, fill #0

## 2021-10-05 NOTE — Telephone Encounter (Signed)
Patient requests diflucan

## 2021-10-05 NOTE — Progress Notes (Signed)
Duplicate encounter

## 2021-10-05 NOTE — Progress Notes (Signed)
I have spent 5 minutes in review of e-visit questionnaire, review and updating patient chart, medical decision making and response to patient.   Skylen Danielsen, PA-C    

## 2021-10-05 NOTE — Telephone Encounter (Signed)
Pt call and stated she had a E-visit and the call her one day of [place] and she want dr.Hernandez to call her a 7 day dose because she have thrush in her mouth. She want it call in to  [facility] Phone:  [phone]  Fax:  [phone]

## 2021-10-05 NOTE — Progress Notes (Signed)
  E-Visit for Sore Throat  We are sorry that you are not feeling well.  Here is how we plan to help!  Your symptoms indicate likely thrush secondary to taking steroids and antibiotics.  Ritta Slot is a fungal infection of the mouth.  I will prescribe an antifungal for treatment today.    For general throat pain, we recommend over the counter oral pain relief medications such as acetaminophen or aspirin, or anti-inflammatory medications such as ibuprofen or naproxen sodium.  Topical treatments such as oral throat lozenges or sprays may be used as needed.  Remember to wash your hands thoroughly throughout the day as this is the number one way to prevent the spread of infection and wipe down door knobs and counters with disinfectant.   Home Care: Only take medications as instructed by your medical team. Do not drink alcohol while taking these medications. A steam or ultrasonic humidifier can help congestion.  You can place a towel over your head and breathe in the steam from hot water coming from a faucet. Avoid close contacts especially the very young and the elderly. Cover your mouth when you cough or sneeze. Always remember to wash your hands.  Get Help Right Away If: You develop worsening fever or throat pain. You develop a severe head ache or visual changes. Your symptoms persist after you have completed your treatment plan.  Make sure you Understand these instructions. Will watch your condition. Will get help right away if you are not doing well or get worse.   Thank you for choosing an e-visit.  Your e-visit answers were reviewed by a board certified advanced clinical practitioner to complete your personal care plan. Depending upon the condition, your plan could have included both over the counter or prescription medications.  Please review your pharmacy choice. Make sure the pharmacy is open so you can pick up prescription now. If there is a problem, you may contact your provider  through [employer] and have the prescription routed to another pharmacy.  Your safety is important to [place]. If you have drug allergies check your prescription carefully.   For the next 24 hours you can use MyChart to ask questions about today's visit, request a non-urgent call back, or ask for a work or school excuse. You will get an email in the next two days asking about your experience. I hope that your e-visit has been valuable and will speed your recovery.

## 2021-10-06 NOTE — Telephone Encounter (Signed)
Attempted to call patient.  Voicemail is not yet set up.

## 2021-10-09 ENCOUNTER — Other Ambulatory Visit (HOSPITAL_COMMUNITY): Payer: Self-pay

## 2021-10-10 ENCOUNTER — Other Ambulatory Visit (HOSPITAL_COMMUNITY): Payer: Self-pay

## 2021-10-10 ENCOUNTER — Other Ambulatory Visit: Payer: Self-pay | Admitting: Family Medicine

## 2021-10-11 ENCOUNTER — Other Ambulatory Visit: Payer: Self-pay | Admitting: Internal Medicine

## 2021-10-11 ENCOUNTER — Other Ambulatory Visit (HOSPITAL_COMMUNITY): Payer: Self-pay

## 2021-10-11 NOTE — Telephone Encounter (Signed)
I spoke with the pt and she stated that she does not need rx anymore as rx was provided by provider she had e-visit with.

## 2021-10-12 ENCOUNTER — Other Ambulatory Visit (HOSPITAL_COMMUNITY): Payer: Self-pay

## 2021-10-12 ENCOUNTER — Other Ambulatory Visit: Payer: Self-pay | Admitting: Internal Medicine

## 2021-10-12 MED FILL — Rimegepant Sulfate Tab Disint 75 MG: ORAL | 30 days supply | Qty: 8 | Fill #5 | Status: CN

## 2021-10-13 ENCOUNTER — Other Ambulatory Visit: Payer: Self-pay | Admitting: Internal Medicine

## 2021-10-13 ENCOUNTER — Other Ambulatory Visit (HOSPITAL_COMMUNITY): Payer: Self-pay

## 2021-10-13 MED FILL — Rimegepant Sulfate Tab Disint 75 MG: ORAL | 30 days supply | Qty: 8 | Fill #5 | Status: CN

## 2021-10-17 ENCOUNTER — Other Ambulatory Visit (HOSPITAL_COMMUNITY): Payer: Self-pay

## 2021-10-17 MED ORDER — ALBUTEROL SULFATE HFA 108 (90 BASE) MCG/ACT IN AERS
2.0000 | INHALATION_SPRAY | Freq: Four times a day (QID) | RESPIRATORY_TRACT | 0 refills | Status: DC | PRN
  Filled 2021-10-17: qty 18, 25d supply, fill #0

## 2021-10-20 ENCOUNTER — Other Ambulatory Visit (HOSPITAL_COMMUNITY): Payer: Self-pay

## 2021-10-20 MED FILL — Rimegepant Sulfate Tab Disint 75 MG: ORAL | 30 days supply | Qty: 8 | Fill #5 | Status: CN

## 2021-10-24 ENCOUNTER — Other Ambulatory Visit (HOSPITAL_COMMUNITY): Payer: Self-pay

## 2021-10-24 ENCOUNTER — Other Ambulatory Visit: Payer: Self-pay

## 2021-10-24 DIAGNOSIS — M7918 Myalgia, other site: Secondary | ICD-10-CM

## 2021-10-24 DIAGNOSIS — G43909 Migraine, unspecified, not intractable, without status migrainosus: Secondary | ICD-10-CM

## 2021-10-24 MED FILL — Rimegepant Sulfate Tab Disint 75 MG: ORAL | 30 days supply | Qty: 8 | Fill #5 | Status: CN

## 2021-10-24 NOTE — Telephone Encounter (Signed)
Patient called requesting a prior authorization for  ([name]) 75 MG  patient stated she has complexes migraines and traditional  migraine medication does not work,  patient also stated that Dr. Jerilee Hoh has taken over Rx from the other provider who prescribed it and that she will be out soon.

## 2021-10-25 ENCOUNTER — Other Ambulatory Visit: Payer: Self-pay

## 2021-10-25 ENCOUNTER — Ambulatory Visit: Payer: 59 | Admitting: Sports Medicine

## 2021-10-25 VITALS — BP 136/80 | HR 100 | Ht 67.0 in | Wt 195.0 lb

## 2021-10-25 DIAGNOSIS — M9908 Segmental and somatic dysfunction of rib cage: Secondary | ICD-10-CM

## 2021-10-25 DIAGNOSIS — M9901 Segmental and somatic dysfunction of cervical region: Secondary | ICD-10-CM

## 2021-10-25 DIAGNOSIS — M9905 Segmental and somatic dysfunction of pelvic region: Secondary | ICD-10-CM | POA: Diagnosis not present

## 2021-10-25 DIAGNOSIS — G4486 Cervicogenic headache: Secondary | ICD-10-CM

## 2021-10-25 DIAGNOSIS — M9903 Segmental and somatic dysfunction of lumbar region: Secondary | ICD-10-CM | POA: Diagnosis not present

## 2021-10-25 DIAGNOSIS — M9902 Segmental and somatic dysfunction of thoracic region: Secondary | ICD-10-CM | POA: Diagnosis not present

## 2021-10-25 NOTE — Patient Instructions (Addendum)
Good to see you   Follow up 6-8 weeks

## 2021-10-25 NOTE — Progress Notes (Signed)
Benito Mccreedy D.[place] Phone: [phone]   Assessment and Plan:     1. Cervicogenic headache 2. Somatic dysfunction of cervical region 3. Somatic dysfunction of thoracic region 4. Somatic dysfunction of lumbar region 5. Somatic dysfunction of pelvic region 6. Somatic dysfunction of rib region -Chronic with exacerbation, subsequent sports medicine visit - Patient continues to have intermittent migraines that tend to be worsened with cervical muscular dysfunction, tightening.  Headaches/migraines have been improved with regular OMT visits every 6 to 8 weeks.  Elects for repeat OMT today.  Tolerated well per note below    Decision today to treat with OMT was based on Physical Exam   After verbal consent patient was treated with HVLA (high velocity low amplitude), ME (muscle energy), FPR (flex positional release), ST (soft tissue), PC/PD (Pelvic Compression/ Pelvic Decompression) techniques in cervical, rib, thoracic, lumbar, and pelvic areas. Patient tolerated the procedure well with improvement in symptoms.  Patient educated on potential side effects of soreness and recommended to rest, hydrate, and use Tylenol as needed for pain control.   Pertinent previous records reviewed include previous sports med notes   Follow Up: In 6 to 8 weeks for repeat OMT   Subjective:   I, Judy Pimple, am serving as a scribe for Dr. Glennon Mac  Chief Complaint: Neck and back pain   HPI:   10/25/21 Patient is a 51 year old female presenting with neck and back pain. Patient was last seen by Dr. Tamala Julian on 09/13/21 and had OMT. Today patient states that her shoulder and neck is very tight and she has a migraine.  Patient says that she took Skelaxin prior to coming to help with muscular tightness.  Relevant Historical Information: Migraines  Additional pertinent review of systems negative.  Current Outpatient Medications   Medication Sig Dispense Refill   albuterol (PROAIR HFA) 108 (90 Base) MCG/ACT inhaler Inhale 2 puffs into the lungs every 6 (six) hours as needed for wheezing or shortness of breath. 18 g 0   ARIPiprazole (ABILIFY) 5 MG tablet Take 2 tablets (10 mg total) by mouth daily. 180 tablet 3   atenolol (TENORMIN) 25 MG tablet Take 1 tablet (25 mg total) by mouth 2 (two) times daily. (Patient taking differently: Take 25 mg by mouth daily.) 180 tablet 3   betamethasone dipropionate 0.05 % cream Apply topically 2 (two) times daily. 45 g 5   betamethasone dipropionate 0.05 % lotion Apply 1 application topically to the scalp daily. Taper use as able. 120 mL 3   Budesonide (RHINOCORT ALLERGY NA) Use 1 spray in each nostril twice daily.     budesonide-formoterol (SYMBICORT) 160-4.5 MCG/ACT inhaler as needed.     cetirizine (ZYRTEC) 10 MG tablet Take 1 tablet by mouth 2 (two) times a day.     Cholecalciferol (VITAMIN D3) 250 MCG (10000 UT) capsule Take 10,000 Units by mouth daily.     dexamethasone (DECADRON) 4 MG tablet Take 1 tablet (4 mg total) by mouth daily with breakfast for 1 day 1 tablet 0   diclofenac (VOLTAREN) 75 MG EC tablet TAKE 1 TABLET (75 MG TOTAL) BY MOUTH 2 (TWO) TIMES DAILY AS NEEDED. 60 tablet 2   Fluocinolone Acetonide Scalp (DERMA-SMOOTHE/FS SCALP) 0.01 % OIL Apply TID 118.28 mL 5   Galcanezumab-gnlm (EMGALITY) 120 MG/ML SOAJ Inject 120 mg into the skin every 30 (thirty) days. 3 mL 3   HYDROcodone-acetaminophen (NORCO) 5-325 MG tablet Take 1 tablet  by mouth every 6 (six) hours as needed for moderate pain. 30 tablet 0   lidocaine (LIDODERM) 5 % Place 1 patch onto the skin daily. Remove & Discard patch within 12 hours or as directed by MD 30 patch 2   liothyronine (CYTOMEL) 5 MCG tablet Take 1 tablet (5 mcg total) by mouth in the morning, at noon, and at bedtime. 270 tablet 3   [START ON 12/04/2021] lisdexamfetamine (VYVANSE) 50 MG capsule Take 1 capsule by mouth daily. 30 capsule 0    lisdexamfetamine (VYVANSE) 50 MG capsule Take 1 capsule (50 mg total) by mouth daily. 30 capsule 0   metaxalone (SKELAXIN) 800 MG tablet TAKE 1 TABLET (800 MG TOTAL) BY MOUTH 3 (THREE) TIMES DAILY AS NEEDED FOR MUSCLE SPASMS. 90 tablet 2   montelukast (SINGULAIR) 10 MG tablet TAKE 1 TABLET BY MOUTH AT BEDTIME EVERY DAY 90 tablet 3   omeprazole (PRILOSEC) 20 MG capsule Take 1 capsule (20 mg total) by mouth daily. 30 capsule 3   promethazine (PHENERGAN) 25 MG tablet Take 1 tablet (25 mg total) by mouth every 6 (six) hours as needed for nausea or vomiting. 30 tablet 3   Rimegepant Sulfate 75 MG TBDP TAKE 1 TABLET BY MOUTH DAILY AS NEEDED 8 tablet 11   SYNTHROID 125 MCG tablet TAKE 1 TABLET (125 MCG TOTAL) BY MOUTH DAILY BEFORE BREAKFAST. 90 tablet 3   traMADol (ULTRAM) 50 MG tablet Take 2 tablets (100 mg total) by mouth 2 (two) times daily. 360 tablet 0   traZODone (DESYREL) 50 MG tablet TAKE 1 & 1/2 TABLETS BY MOUTH ONCE DAILY AT BEDTIME AS NEEDED FOR SLEEP. 135 tablet 3   ARIPiprazole (ABILIFY) 10 MG tablet Take 1 tablet (10 mg total) by mouth daily. (Patient not taking: Reported on 10/25/2021) 90 tablet 3   azithromycin (ZITHROMAX) 250 MG tablet Take two tablets on the first day and then one tablet every day after. (Patient not taking: Reported on 10/25/2021) 6 tablet 0   COVID-19 At Home Antigen Test (CARESTART COVID-19 HOME TEST) KIT Use as directed (Patient not taking: Reported on 10/25/2021) 4 each 0   fluconazole (DIFLUCAN) 100 MG tablet Take 2 tablets by mouth today, then take 1 tablet daily until gone. (Patient not taking: Reported on 10/25/2021) 8 tablet 0   nystatin (MYCOSTATIN) 100000 UNIT/ML suspension Take 5 mLs (500,000 Units total) by mouth 4 (four) times daily. Take up to 14 days until complete resolution of symptoms (Patient not taking: Reported on 10/25/2021) 473 mL 0   predniSONE (DELTASONE) 20 MG tablet Take 2 tablets (40 mg total) by mouth daily with breakfast. (Patient not taking:  Reported on 10/25/2021) 8 tablet 0   No current facility-administered medications for this visit.      Objective:     Vitals:   10/25/21 1348  BP: 136/80  Pulse: 100  SpO2: 98%  Weight: 195 lb (88.5 kg)  Height: 5' 7"  (1.702 m)      Body mass index is 30.54 kg/m.    Physical Exam:     General: Well-appearing, cooperative, sitting comfortably in no acute distress.   OMT Physical Exam:  ASIS Compression Test: Positive Right Cervical: TTP paraspinal, C3 RRSL Rib: Bilateral elevated first rib with TTP Thoracic: TTP paraspinal, T3-5 RRSL Lumbar: NTTP paraspinal, L1 RRSR Pelvis: Right anterior innominate without flare  Electronically signed by:  Benito Mccreedy D.[facility] 2:45 PM 10/25/21

## 2021-10-26 ENCOUNTER — Other Ambulatory Visit (HOSPITAL_COMMUNITY): Payer: Self-pay

## 2021-10-26 ENCOUNTER — Other Ambulatory Visit: Payer: Self-pay | Admitting: *Deleted

## 2021-10-26 MED ORDER — NURTEC 75 MG PO TBDP
1.0000 | ORAL_TABLET | Freq: Every day | ORAL | 2 refills | Status: DC | PRN
  Filled 2021-10-26: qty 30, 30d supply, fill #0
  Filled 2021-10-31: qty 8, 30d supply, fill #0
  Filled 2021-11-01: qty 16, 30d supply, fill #0
  Filled 2022-01-22: qty 16, 30d supply, fill #1
  Filled 2022-03-21: qty 16, 30d supply, fill #2
  Filled 2022-04-24: qty 16, 30d supply, fill #3
  Filled 2022-06-06: qty 16, 30d supply, fill #4
  Filled 2022-06-27 – 2022-07-10 (×2): qty 10, 19d supply, fill #5

## 2021-10-26 NOTE — Telephone Encounter (Signed)
Refill sent.  Awaiting to see if a PA is needed.

## 2021-10-26 NOTE — Addendum Note (Signed)
Addended by: Westley Hummer B on: 10/26/2021 04:34 PM   Modules accepted: Orders

## 2021-10-26 NOTE — Telephone Encounter (Signed)
Okay to refill and start a PA?

## 2021-10-27 ENCOUNTER — Other Ambulatory Visit (HOSPITAL_COMMUNITY): Payer: Self-pay

## 2021-10-30 ENCOUNTER — Other Ambulatory Visit: Payer: Self-pay

## 2021-10-30 ENCOUNTER — Other Ambulatory Visit (HOSPITAL_COMMUNITY): Payer: Self-pay

## 2021-10-30 MED FILL — Diclofenac Sodium Tab Delayed Release 75 MG: ORAL | 30 days supply | Qty: 60 | Fill #0 | Status: AC

## 2021-10-31 ENCOUNTER — Other Ambulatory Visit (HOSPITAL_COMMUNITY): Payer: Self-pay

## 2021-10-31 MED ORDER — TRAMADOL HCL 50 MG PO TABS
100.0000 mg | ORAL_TABLET | Freq: Two times a day (BID) | ORAL | 0 refills | Status: DC
  Filled 2021-10-31: qty 360, 90d supply, fill #0

## 2021-10-31 NOTE — Addendum Note (Signed)
Addended by: Westley Hummer B on: 10/31/2021 03:57 PM   Modules accepted: Orders

## 2021-10-31 NOTE — Addendum Note (Signed)
Addended by: Westley Hummer B on: 10/31/2021 05:11 PM   Modules accepted: Orders

## 2021-10-31 NOTE — Telephone Encounter (Signed)
Prior [employer]: [id number]

## 2021-10-31 NOTE — Telephone Encounter (Signed)
Please resend traMADol (ULTRAM) 50 MG tablet.  Pharmacy did not receive it.

## 2021-11-01 ENCOUNTER — Other Ambulatory Visit (HOSPITAL_COMMUNITY): Payer: Self-pay

## 2021-11-01 NOTE — Telephone Encounter (Signed)
PA is approved with 12 fills, 10/31/21 -10/30/22, #16 for 30 day fill. Pharmacist is aware

## 2021-11-06 ENCOUNTER — Other Ambulatory Visit: Payer: Self-pay | Admitting: Internal Medicine

## 2021-11-06 ENCOUNTER — Other Ambulatory Visit (HOSPITAL_COMMUNITY): Payer: Self-pay

## 2021-11-06 ENCOUNTER — Telehealth: Payer: Self-pay | Admitting: Internal Medicine

## 2021-11-06 DIAGNOSIS — E063 Autoimmune thyroiditis: Secondary | ICD-10-CM

## 2021-11-06 MED FILL — Trazodone HCl Tab 50 MG: ORAL | 90 days supply | Qty: 135 | Fill #2 | Status: AC

## 2021-11-06 NOTE — Telephone Encounter (Signed)
Pt is calling and needs a refill on vyvanse 50 mg send to [facility]

## 2021-11-07 ENCOUNTER — Other Ambulatory Visit (HOSPITAL_COMMUNITY): Payer: Self-pay

## 2021-11-07 ENCOUNTER — Other Ambulatory Visit: Payer: Self-pay | Admitting: Internal Medicine

## 2021-11-07 DIAGNOSIS — F988 Other specified behavioral and emotional disorders with onset usually occurring in childhood and adolescence: Secondary | ICD-10-CM

## 2021-11-07 MED ORDER — SYNTHROID 125 MCG PO TABS
125.0000 ug | ORAL_TABLET | Freq: Every morning | ORAL | 3 refills | Status: DC
  Filled 2021-11-07: qty 90, 90d supply, fill #0

## 2021-11-08 ENCOUNTER — Other Ambulatory Visit (HOSPITAL_COMMUNITY): Payer: Self-pay

## 2021-11-08 MED ORDER — LISDEXAMFETAMINE DIMESYLATE 50 MG PO CAPS
50.0000 mg | ORAL_CAPSULE | Freq: Every day | ORAL | 0 refills | Status: DC
  Filled 2021-11-08: qty 30, 30d supply, fill #0

## 2021-11-08 MED ORDER — LISDEXAMFETAMINE DIMESYLATE 50 MG PO CAPS: 50.0000 mg | ORAL_CAPSULE | Freq: Every day | ORAL | 0 refills | Status: DC

## 2021-11-08 NOTE — Telephone Encounter (Signed)
Refill sent.

## 2021-11-08 NOTE — Telephone Encounter (Signed)
Last office visit 08/23/21. Would you like 3 refills?

## 2021-11-16 ENCOUNTER — Other Ambulatory Visit (HOSPITAL_COMMUNITY): Payer: Self-pay

## 2021-11-16 MED FILL — Montelukast Sodium Tab 10 MG (Base Equiv): ORAL | 90 days supply | Qty: 90 | Fill #2 | Status: AC

## 2021-11-27 ENCOUNTER — Other Ambulatory Visit (HOSPITAL_COMMUNITY): Payer: Self-pay

## 2021-11-29 ENCOUNTER — Other Ambulatory Visit (HOSPITAL_COMMUNITY): Payer: Self-pay

## 2021-11-29 ENCOUNTER — Ambulatory Visit: Payer: 59 | Admitting: Internal Medicine

## 2021-11-29 ENCOUNTER — Encounter: Payer: Self-pay | Admitting: Internal Medicine

## 2021-11-29 ENCOUNTER — Telehealth: Payer: Self-pay | Admitting: Internal Medicine

## 2021-11-29 VITALS — BP 120/80 | HR 99 | Temp 98.2°F | Wt 193.6 lb

## 2021-11-29 DIAGNOSIS — M7989 Other specified soft tissue disorders: Secondary | ICD-10-CM | POA: Diagnosis not present

## 2021-11-29 DIAGNOSIS — E559 Vitamin D deficiency, unspecified: Secondary | ICD-10-CM

## 2021-11-29 DIAGNOSIS — Z0000 Encounter for general adult medical examination without abnormal findings: Secondary | ICD-10-CM

## 2021-11-29 DIAGNOSIS — G4700 Insomnia, unspecified: Secondary | ICD-10-CM | POA: Diagnosis not present

## 2021-11-29 DIAGNOSIS — F988 Other specified behavioral and emotional disorders with onset usually occurring in childhood and adolescence: Secondary | ICD-10-CM

## 2021-11-29 DIAGNOSIS — B0229 Other postherpetic nervous system involvement: Secondary | ICD-10-CM | POA: Diagnosis not present

## 2021-11-29 DIAGNOSIS — M79643 Pain in unspecified hand: Secondary | ICD-10-CM

## 2021-11-29 DIAGNOSIS — M7918 Myalgia, other site: Secondary | ICD-10-CM | POA: Diagnosis not present

## 2021-11-29 DIAGNOSIS — R7302 Impaired glucose tolerance (oral): Secondary | ICD-10-CM

## 2021-11-29 DIAGNOSIS — M5030 Other cervical disc degeneration, unspecified cervical region: Secondary | ICD-10-CM

## 2021-11-29 LAB — POCT GLYCOSYLATED HEMOGLOBIN (HGB A1C): Hemoglobin A1C: 5.8 % — AB (ref 4.0–5.6)

## 2021-11-29 MED ORDER — LISDEXAMFETAMINE DIMESYLATE 50 MG PO CAPS
50.0000 mg | ORAL_CAPSULE | Freq: Every day | ORAL | 0 refills | Status: DC
  Filled 2021-11-29 – 2021-12-08 (×2): qty 30, 30d supply, fill #0

## 2021-11-29 MED ORDER — TRAZODONE HCL 50 MG PO TABS
50.0000 mg | ORAL_TABLET | Freq: Every evening | ORAL | 1 refills | Status: DC | PRN
  Filled 2021-11-29 – 2022-02-06 (×2): qty 60, 30d supply, fill #0
  Filled 2022-03-11: qty 60, 30d supply, fill #1

## 2021-11-29 MED ORDER — LIDOCAINE 5 % EX PTCH
1.0000 | MEDICATED_PATCH | CUTANEOUS | 2 refills | Status: DC
  Filled 2021-11-29: qty 30, 30d supply, fill #0
  Filled 2022-11-26: qty 30, 30d supply, fill #1

## 2021-11-29 MED ORDER — LISDEXAMFETAMINE DIMESYLATE 50 MG PO CAPS
50.0000 mg | ORAL_CAPSULE | Freq: Every day | ORAL | 0 refills | Status: DC
  Filled 2021-11-29 – 2022-01-05 (×2): qty 30, 30d supply, fill #0

## 2021-11-29 MED ORDER — LISDEXAMFETAMINE DIMESYLATE 50 MG PO CAPS: 50.0000 mg | ORAL_CAPSULE | Freq: Every day | ORAL | 0 refills | Status: DC

## 2021-11-29 NOTE — Progress Notes (Signed)
Established Patient Office Visit     This visit occurred during the SARS-CoV-2 public health emergency.  Safety protocols were in place, including screening questions prior to the visit, additional usage of staff PPE, and extensive cleaning of exam room while observing appropriate contact time as indicated for disinfecting solutions.    CC/Reason for Visit: 48-month follow-up chronic medical conditions  HPI: Barbara Wood is a 51 y.o. female who is coming in today for the above mentioned reasons. Past Medical History is significant for: ADHD on stable dose of Vyvanse for approximately 9 years, she has a very significant history of migraines that is relatively well controlled on Emgality and [name].  She has myofascial pain and cervical disc disease gets OMT by Dr. Gardenia Phlegm she takes tramadol daily and has a very sporadic prescription for Norco for migraine relief when the [name] and Emgality do not take care of it.  She also has a history of impaired glucose tolerance.  She has been doing well.  She needs refills of her Vyvanse and lidocaine patches.  She is also requesting her trazodone quantity be increased as she sometimes takes 75 mg instead of 50 as needed.   Past Medical/Surgical History: Past Medical History:  Diagnosis Date   Allergy Age 80   Asthma Since age 71   Depression Age 7   Hashimoto's disease    Heart murmur Mitral valve prolapse age 68   Migraine    Psoriasis    Thyroid disease Since age 64    Past Surgical History:  Procedure Laterality Date   BREAST SURGERY  Reduction age 43   CHOLECYSTECTOMY  Age 61   KNEE ARTHROSCOPY W/ LATERAL RELEASE Right    WISDOM TOOTH EXTRACTION      Social History:  reports that she has never smoked. She has never used smokeless tobacco. She reports current alcohol use. She reports that she does not use drugs.  Allergies: Allergies  Allergen Reactions   Amoxicillin-Pot Clavulanate Nausea And Vomiting   Sertraline Other  (See Comments)    hallucinations    Sulfamethoxazole-Trimethoprim Hives   Triptans Other (See Comments)    Chest pain     Family History:  Family History  Problem Relation Age of Onset   Asthma Sister    Miscarriages / [place] Sister    Depression Sister    Psoriasis Sister    Cancer Mother    Depression Mother    Hashimoto's thyroiditis Mother    Diabetes Mother    Bipolar disorder Mother    Hypertension Father    Ankylosing spondylitis Father    Psoriasis Father    Psoriasis Brother    Migraines Brother    ADD / ADHD Brother    Eczema Brother    Allergies Brother    Psoriasis Maternal Aunt    Psoriasis Sister    Arthritis Sister        psoriatic arthritis    Psoriasis Sister    Rheum arthritis Sister    Depression Sister    Anxiety disorder Sister    Eczema Sister    Psoriasis Brother    ADD / ADHD Daughter    Eczema Daughter    Asthma Daughter      Current Outpatient Medications:    albuterol (PROAIR HFA) 108 (90 Base) MCG/ACT inhaler, Inhale 2 puffs into the lungs every 6 (six) hours as needed for wheezing or shortness of breath., Disp: 18 g, Rfl: 0   ARIPiprazole (ABILIFY) 10 MG  tablet, Take 1 tablet (10 mg total) by mouth daily., Disp: 90 tablet, Rfl: 3   ARIPiprazole (ABILIFY) 5 MG tablet, Take 2 tablets (10 mg total) by mouth daily., Disp: 180 tablet, Rfl: 3   atenolol (TENORMIN) 25 MG tablet, Take 1 tablet (25 mg total) by mouth 2 (two) times daily. (Patient taking differently: Take 25 mg by mouth daily.), Disp: 180 tablet, Rfl: 3   betamethasone dipropionate 0.05 % cream, Apply topically 2 (two) times daily., Disp: 45 g, Rfl: 5   betamethasone dipropionate 0.05 % lotion, Apply 1 application topically to the scalp daily. Taper use as able., Disp: 120 mL, Rfl: 3   budesonide-formoterol (SYMBICORT) 160-4.5 MCG/ACT inhaler, as needed., Disp: , Rfl:    cetirizine (ZYRTEC) 10 MG tablet, Take 1 tablet by mouth 2 (two) times a day., Disp: , Rfl:     Cholecalciferol (VITAMIN D3) 250 MCG (10000 UT) capsule, Take 10,000 Units by mouth daily., Disp: , Rfl:    diclofenac (VOLTAREN) 75 MG EC tablet, Take 1 tablet (75 mg total) by mouth 2 (two) times daily as needed., Disp: 60 tablet, Rfl: 2   Fluocinolone Acetonide Scalp (DERMA-SMOOTHE/FS SCALP) 0.01 % OIL, Apply TID, Disp: 118.28 mL, Rfl: 5   Galcanezumab-gnlm (EMGALITY) 120 MG/ML SOAJ, Inject 120 mg into the skin every 30 (thirty) days., Disp: 3 mL, Rfl: 3   HYDROcodone-acetaminophen (NORCO) 5-325 MG tablet, Take 1 tablet by mouth every 6 (six) hours as needed for moderate pain., Disp: 30 tablet, Rfl: 0   liothyronine (CYTOMEL) 5 MCG tablet, Take 1 tablet (5 mcg total) by mouth in the morning, at noon, and at bedtime., Disp: 270 tablet, Rfl: 3   metaxalone (SKELAXIN) 800 MG tablet, TAKE 1 TABLET (800 MG TOTAL) BY MOUTH 3 (THREE) TIMES DAILY AS NEEDED FOR MUSCLE SPASMS., Disp: 90 tablet, Rfl: 2   montelukast (SINGULAIR) 10 MG tablet, TAKE 1 TABLET BY MOUTH AT BEDTIME EVERY DAY, Disp: 90 tablet, Rfl: 3   omeprazole (PRILOSEC) 20 MG capsule, Take 1 capsule (20 mg total) by mouth daily., Disp: 30 capsule, Rfl: 3   promethazine (PHENERGAN) 25 MG tablet, Take 1 tablet (25 mg total) by mouth every 6 (six) hours as needed for nausea or vomiting., Disp: 30 tablet, Rfl: 3   Rimegepant Sulfate ([name]) 75 MG TBDP, Take 1 tablet by mouth daily as needed., Disp: 30 tablet, Rfl: 2   Rimegepant Sulfate 75 MG TBDP, TAKE 1 TABLET BY MOUTH DAILY AS NEEDED, Disp: 8 tablet, Rfl: 11   SYNTHROID 125 MCG tablet, Take 1 tablet (125 mcg total) by mouth every morning before breakfast, Disp: 90 tablet, Rfl: 3   traMADol (ULTRAM) 50 MG tablet, Take 2 tablets (100 mg total) by mouth 2 (two) times daily., Disp: 360 tablet, Rfl: 0   traZODone (DESYREL) 50 MG tablet, Take 1-2 tablets (50-100 mg total) by mouth at bedtime as needed for sleep., Disp: 60 tablet, Rfl: 1   lidocaine (LIDODERM) 5 %, Place 1 patch onto the skin daily.  Remove & Discard patch within 12 hours or as directed by MD, Disp: 30 patch, Rfl: 2   lisdexamfetamine (VYVANSE) 50 MG capsule, Take 1 capsule (50 mg total) by mouth daily., Disp: 30 capsule, Rfl: 0   [START ON 12/04/2021] lisdexamfetamine (VYVANSE) 50 MG capsule, Take 1 capsule by mouth daily (fill 01/08/2022), Disp: 30 capsule, Rfl: 0   lisdexamfetamine (VYVANSE) 50 MG capsule, Take 1 capsule (50 mg total) by mouth daily. (fill on 12/08/21), Disp: 30 capsule, Rfl: 0  Review of Systems:  Constitutional: Denies fever, chills, diaphoresis, appetite change and fatigue.  HEENT: Denies photophobia, eye pain, redness, hearing loss, ear pain, congestion, sore throat, rhinorrhea, sneezing, mouth sores, trouble swallowing, neck pain, neck stiffness and tinnitus.   Respiratory: Denies SOB, DOE, cough, chest tightness,  and wheezing.   Cardiovascular: Denies chest pain, palpitations and leg swelling.  Gastrointestinal: Denies nausea, vomiting, abdominal pain, diarrhea, constipation, blood in stool and abdominal distention.  Genitourinary: Denies dysuria, urgency, frequency, hematuria, flank pain and difficulty urinating.  Endocrine: Denies: hot or cold intolerance, sweats, changes in hair or nails, polyuria, polydipsia. Musculoskeletal: Denies myalgias, back pain, joint swelling, arthralgias and gait problem.  Skin: Denies pallor, rash and wound.  Neurological: Denies dizziness, seizures, syncope, weakness, light-headedness, numbness and headaches.  Hematological: Denies adenopathy. Easy bruising, personal or family bleeding history  Psychiatric/Behavioral: Denies suicidal ideation, mood changes, confusion, nervousness and agitation    Physical Exam: Vitals:   11/29/21 1322  BP: 120/80  Pulse: 99  Temp: 98.2 F (36.8 C)  TempSrc: Oral  SpO2: 98%  Weight: 193 lb 9.6 oz (87.8 kg)    Body mass index is 30.32 kg/m.   Constitutional: NAD, calm, comfortable Eyes: PERRL, lids and conjunctivae  normal ENMT: Mucous membranes are moist.  Respiratory: clear to auscultation bilaterally, no wheezing, no crackles. Normal respiratory effort. No accessory muscle use.  Cardiovascular: Regular rate and rhythm, no murmurs / rubs / gallops. No extremity edema.  Psychiatric: Normal judgment and insight. Alert and oriented x 3. Normal mood.    Impression and Plan:  IGT (impaired glucose tolerance)  - Plan: POCT glycosylated hemoglobin (Hb A1C) -A1c stable to slightly improved at 5.8 today.  Postherpetic neuralgia  - Plan: lidocaine (LIDODERM) 5 %  Attention deficit disorder, unspecified hyperactivity presence  -PDMP reviewed, no red flags, overdose risk score is 270. -Refill Vyvanse 50 mg to take 1 capsule daily for total of 30 tablets a month x3 months.  Myofascial pain Degenerative disc disease, cervical -Gets frequent OMT sessions, also uses lidocaine patches as needed.  Insomnia, unspecified type  - Plan: traZODone (DESYREL) 50 MG tablet to take 1 to 1-1/2 tablet daily as needed.     Lelon Frohlich, MD [facility]

## 2021-11-29 NOTE — Telephone Encounter (Signed)
Patient scheduled an appointment for a physical with Dr. Jerilee Hoh for 12/15 at 3:00pm.  Patient would like to come in earlier that morning for labs.  Please advise.

## 2021-11-29 NOTE — Telephone Encounter (Signed)
Lab orders placed.  

## 2021-12-05 NOTE — Progress Notes (Signed)
[facility] [place] Phone: [phone] Subjective:    I'm seeing this patient by the request  of:  [name], MD  CC: Neck and back pain follow-up  [id number]  Barbara Wood is a 51 y.o. female coming in with complaint of back and neck pain Patient states neck pain about the same. Nothing new.  Patient overall does have some tenderness but denies any significant radiation of pain.  Nothing that is stopping her from activities at this time.          Reviewed prior external information including notes and imaging from previsou exam, outside providers and external EMR if available.   As well as notes that were available from care everywhere and other healthcare systems.  Past medical history, social, surgical and family history all reviewed in electronic medical record.  No pertanent information unless stated regarding to the chief complaint.   Past Medical History:  Diagnosis Date   Allergy Age 21   Asthma Since age 50   Depression Age 51   Hashimoto's disease    Heart murmur Mitral valve prolapse age 6   Migraine    Psoriasis    Thyroid disease Since age 69    Allergies  Allergen Reactions   Amoxicillin-Pot Clavulanate Nausea And Vomiting   Sertraline Other (See Comments)    hallucinations    Sulfamethoxazole-Trimethoprim Hives   Triptans Other (See Comments)    Chest pain      Review of Systems:  No headache, visual changes, nausea, vomiting, diarrhea, constipation, dizziness, abdominal pain, skin rash, fevers, chills, night sweats, weight loss, swollen lymph nodes, body aches, joint swelling, chest pain, shortness of breath, mood changes. POSITIVE muscle aches  Objective  Blood pressure 132/84, height 5\' 7"  (1.702 m), weight 191 lb (86.6 kg).   General: No apparent distress alert and oriented x3 mood and affect normal, dressed appropriately.  HEENT: Pupils equal, extraocular  movements intact  Respiratory: Patient's speak in full sentences and does not appear short of breath  Cardiovascular: No lower extremity edema, non tender, no erythema  Neck exam does have some loss of lordosis and some difficulty with sidebending bilaterally.  Patient also has full strength of the upper extremities.  Osteopathic findings  C2 flexed rotated and side bent right C6 flexed rotated and side bent left T3 extended rotated and side bent right inhaled rib T8 extended rotated and side bent left L2 flexed rotated and side bent right Sacrum right on right       Assessment and Plan:  Degenerative disc disease, cervical Known degenerative disc disease.  Discussed icing regimen and home exercises.  Discussed which activities to do which wants to avoid.  Increase activity slowly.  Patient has responded well to osteopathic epilation.  Follow-up again in 4 to 8 weeks   Nonallopathic problems  Decision today to treat with OMT was based on Physical Exam  After verbal consent patient was treated with HVLA, ME, FPR techniques in cervical, rib, thoracic, lumbar, and sacral  areas  Patient tolerated the procedure well with improvement in symptoms  Patient given exercises, stretches and lifestyle modifications  See medications in patient instructions if given  Patient will follow up in 4-8 weeks      The above documentation has been reviewed and is accurate and complete Lyndal Pulley, DO       Note: This dictation was prepared with Dragon dictation along with smaller phrase technology.  Any transcriptional errors that result from this process are unintentional.

## 2021-12-06 ENCOUNTER — Other Ambulatory Visit (HOSPITAL_COMMUNITY): Payer: Self-pay

## 2021-12-06 ENCOUNTER — Ambulatory Visit: Payer: 59 | Admitting: Family Medicine

## 2021-12-06 ENCOUNTER — Other Ambulatory Visit: Payer: Self-pay

## 2021-12-06 VITALS — BP 132/84 | Ht 67.0 in | Wt 191.0 lb

## 2021-12-06 DIAGNOSIS — M5030 Other cervical disc degeneration, unspecified cervical region: Secondary | ICD-10-CM

## 2021-12-06 DIAGNOSIS — M9903 Segmental and somatic dysfunction of lumbar region: Secondary | ICD-10-CM | POA: Diagnosis not present

## 2021-12-06 DIAGNOSIS — M9901 Segmental and somatic dysfunction of cervical region: Secondary | ICD-10-CM | POA: Diagnosis not present

## 2021-12-06 DIAGNOSIS — M9904 Segmental and somatic dysfunction of sacral region: Secondary | ICD-10-CM | POA: Diagnosis not present

## 2021-12-06 DIAGNOSIS — M9902 Segmental and somatic dysfunction of thoracic region: Secondary | ICD-10-CM | POA: Diagnosis not present

## 2021-12-06 DIAGNOSIS — M9908 Segmental and somatic dysfunction of rib cage: Secondary | ICD-10-CM | POA: Diagnosis not present

## 2021-12-06 NOTE — Patient Instructions (Signed)
See me in 6 weeks

## 2021-12-06 NOTE — Assessment & Plan Note (Signed)
Known degenerative disc disease.  Discussed icing regimen and home exercises.  Discussed which activities to do which wants to avoid.  Increase activity slowly.  Patient has responded well to osteopathic epilation.  Follow-up again in 4 to 8 weeks

## 2021-12-08 ENCOUNTER — Other Ambulatory Visit (HOSPITAL_COMMUNITY): Payer: Self-pay

## 2021-12-14 ENCOUNTER — Other Ambulatory Visit: Payer: 59

## 2021-12-14 ENCOUNTER — Other Ambulatory Visit (INDEPENDENT_AMBULATORY_CARE_PROVIDER_SITE_OTHER): Payer: 59

## 2021-12-14 ENCOUNTER — Encounter: Payer: Self-pay | Admitting: Internal Medicine

## 2021-12-14 ENCOUNTER — Ambulatory Visit (INDEPENDENT_AMBULATORY_CARE_PROVIDER_SITE_OTHER): Payer: 59 | Admitting: Internal Medicine

## 2021-12-14 ENCOUNTER — Other Ambulatory Visit (HOSPITAL_COMMUNITY): Payer: Self-pay

## 2021-12-14 VITALS — BP 110/70 | Temp 97.9°F | Ht 67.0 in | Wt 188.4 lb

## 2021-12-14 DIAGNOSIS — G43909 Migraine, unspecified, not intractable, without status migrainosus: Secondary | ICD-10-CM

## 2021-12-14 DIAGNOSIS — M7918 Myalgia, other site: Secondary | ICD-10-CM | POA: Diagnosis not present

## 2021-12-14 DIAGNOSIS — E559 Vitamin D deficiency, unspecified: Secondary | ICD-10-CM | POA: Diagnosis not present

## 2021-12-14 DIAGNOSIS — E063 Autoimmune thyroiditis: Secondary | ICD-10-CM

## 2021-12-14 DIAGNOSIS — R7302 Impaired glucose tolerance (oral): Secondary | ICD-10-CM

## 2021-12-14 DIAGNOSIS — Z1231 Encounter for screening mammogram for malignant neoplasm of breast: Secondary | ICD-10-CM

## 2021-12-14 DIAGNOSIS — Z0000 Encounter for general adult medical examination without abnormal findings: Secondary | ICD-10-CM | POA: Diagnosis not present

## 2021-12-14 DIAGNOSIS — Z23 Encounter for immunization: Secondary | ICD-10-CM

## 2021-12-14 LAB — COMPREHENSIVE METABOLIC PANEL
ALT: 12 U/L (ref 0–35)
AST: 13 U/L (ref 0–37)
Albumin: 3.9 g/dL (ref 3.5–5.2)
Alkaline Phosphatase: 85 U/L (ref 39–117)
BUN: 18 mg/dL (ref 6–23)
CO2: 31 mEq/L (ref 19–32)
Calcium: 9.5 mg/dL (ref 8.4–10.5)
Chloride: 104 mEq/L (ref 96–112)
Creatinine, Ser: 0.74 mg/dL (ref 0.40–1.20)
GFR: 95.29 mL/min (ref >60.00)
Glucose, Bld: 96 mg/dL (ref 70–99)
Potassium: 4 mEq/L (ref 3.5–5.1)
Sodium: 141 mEq/L (ref 135–145)
Total Bilirubin: 0.4 mg/dL (ref 0.2–1.2)
Total Protein: 6.8 g/dL (ref 6.0–8.3)

## 2021-12-14 LAB — CBC WITH DIFFERENTIAL/PLATELET
Basophils Absolute: 0 10*3/uL (ref 0.0–0.1)
Basophils Relative: 0.5 % (ref 0.0–3.0)
Eosinophils Absolute: 0.2 10*3/uL (ref 0.0–0.7)
Eosinophils Relative: 2.9 % (ref 0.0–5.0)
HCT: 37 % (ref 36.0–46.0)
Hemoglobin: 12.1 g/dL (ref 12.0–15.0)
Lymphocytes Relative: 24 % (ref 12.0–46.0)
Lymphs Abs: 1.6 10*3/uL (ref 0.7–4.0)
MCHC: 32.7 g/dL (ref 30.0–36.0)
MCV: 84.6 fl (ref 78.0–100.0)
Monocytes Absolute: 0.5 10*3/uL (ref 0.1–1.0)
Monocytes Relative: 7.5 % (ref 3.0–12.0)
Neutro Abs: 4.3 10*3/uL (ref 1.4–7.7)
Neutrophils Relative %: 65.1 % (ref 43.0–77.0)
Platelets: 223 10*3/uL (ref 150.0–400.0)
RBC: 4.37 Mil/uL (ref 3.87–5.11)
RDW: 14.3 % (ref 11.5–15.5)
WBC: 6.6 10*3/uL (ref 4.0–10.5)

## 2021-12-14 LAB — LIPID PANEL
Cholesterol: 153 mg/dL (ref 0–200)
HDL: 67.3 mg/dL (ref >39.00)
LDL Cholesterol: 65 mg/dL (ref 0–99)
NonHDL: 86.08
Total CHOL/HDL Ratio: 2
Triglycerides: 103 mg/dL (ref 0.0–149.0)
VLDL: 20.6 mg/dL (ref 0.0–40.0)

## 2021-12-14 LAB — HEMOGLOBIN A1C: Hgb A1c MFr Bld: 6.1 % (ref 4.6–6.5)

## 2021-12-14 LAB — VITAMIN D 25 HYDROXY (VIT D DEFICIENCY, FRACTURES): VITD: 66.13 ng/mL (ref 30.00–100.00)

## 2021-12-14 LAB — TSH: TSH: 0.03 u[IU]/mL — ABNORMAL LOW (ref 0.35–5.50)

## 2021-12-14 LAB — VITAMIN B12: Vitamin B-12: 272 pg/mL (ref 211–911)

## 2021-12-14 MED ORDER — LEVOTHYROXINE SODIUM 112 MCG PO TABS
112.0000 ug | ORAL_TABLET | Freq: Every day | ORAL | 1 refills | Status: DC
Start: 2021-12-14 — End: 2022-05-24
  Filled 2021-12-14: qty 90, 90d supply, fill #0
  Filled 2022-03-11: qty 90, 90d supply, fill #1

## 2021-12-14 NOTE — Patient Instructions (Signed)
-  Nice seeing you today!!  -Decrease levothyroxine to 112 mcg daily. Rx sent. Repeat TSH in 6 weeks.  -Schedule follow up in 6 months.

## 2021-12-14 NOTE — Progress Notes (Signed)
Established Patient Office Visit     This visit occurred during the SARS-CoV-2 public health emergency.  Safety protocols were in place, including screening questions prior to the visit, additional usage of staff PPE, and extensive cleaning of exam room while observing appropriate contact time as indicated for disinfecting solutions.    CC/Reason for Visit: Annual preventive exam  HPI: Barbara Wood is a 51 y.o. female who is coming in today for the above mentioned reasons. Past Medical History is significant for:  ADHD on stable dose of Vyvanse for approximately 9 years, she has a very significant history of migraines that is relatively well controlled on Emgality and [name].  She has myofascial pain and cervical disc disease gets OMT by Dr. Gardenia Phlegm she takes tramadol daily and has a very sporadic prescription for Norco for migraine relief when the [name] and Emgality do not take care of it.  She also has a history of impaired glucose tolerance.  She also has hypothyroidism.  She has routine eye and dental care.  She is overdue for Tdap and her COVID booster.  She had a colonoscopy in 2016.  She had her mammogram in January, she had a Pap smear over the summer this year.  She had her labs done this morning that showed an over suppressed TSH at 0.03.  She has been having some tachycardia, she is on levothyroxine 125 mcg daily for   Past Medical/Surgical History: Past Medical History:  Diagnosis Date   Allergy Age 12   Asthma Since age 27   Depression Age 26   Hashimoto's disease    Heart murmur Mitral valve prolapse age 5   Migraine    Psoriasis    Thyroid disease Since age 40    Past Surgical History:  Procedure Laterality Date   BREAST SURGERY  Reduction age 40   CHOLECYSTECTOMY  Age 81   KNEE ARTHROSCOPY W/ LATERAL RELEASE Right    WISDOM TOOTH EXTRACTION      Social History:  reports that she has never smoked. She has never used smokeless tobacco. She reports current  alcohol use. She reports that she does not use drugs.  Allergies: Allergies  Allergen Reactions   Amoxicillin-Pot Clavulanate Nausea And Vomiting   Sertraline Other (See Comments)    hallucinations    Sulfamethoxazole-Trimethoprim Hives   Triptans Other (See Comments)    Chest pain     Family History:  Family History  Problem Relation Age of Onset   Asthma Sister    Miscarriages / [place] Sister    Depression Sister    Psoriasis Sister    Cancer Mother    Depression Mother    Hashimoto's thyroiditis Mother    Diabetes Mother    Bipolar disorder Mother    Hypertension Father    Ankylosing spondylitis Father    Psoriasis Father    Psoriasis Brother    Migraines Brother    ADD / ADHD Brother    Eczema Brother    Allergies Brother    Psoriasis Maternal Aunt    Psoriasis Sister    Arthritis Sister        psoriatic arthritis    Psoriasis Sister    Rheum arthritis Sister    Depression Sister    Anxiety disorder Sister    Eczema Sister    Psoriasis Brother    ADD / ADHD Daughter    Eczema Daughter    Asthma Daughter      Current Outpatient Medications:  levothyroxine (SYNTHROID) 112 MCG tablet, Take 1 tablet (112 mcg total) by mouth daily., Disp: 90 tablet, Rfl: 1   albuterol (PROAIR HFA) 108 (90 Base) MCG/ACT inhaler, Inhale 2 puffs into the lungs every 6 (six) hours as needed for wheezing or shortness of breath., Disp: 18 g, Rfl: 0   ARIPiprazole (ABILIFY) 10 MG tablet, Take 1 tablet (10 mg total) by mouth daily., Disp: 90 tablet, Rfl: 3   atenolol (TENORMIN) 25 MG tablet, Take 1 tablet (25 mg total) by mouth 2 (two) times daily. (Patient taking differently: Take 25 mg by mouth daily.), Disp: 180 tablet, Rfl: 3   betamethasone dipropionate 0.05 % cream, Apply topically 2 (two) times daily., Disp: 45 g, Rfl: 5   betamethasone dipropionate 0.05 % lotion, Apply 1 application topically to the scalp daily. Taper use as able., Disp: 120 mL, Rfl: 3    budesonide-formoterol (SYMBICORT) 160-4.5 MCG/ACT inhaler, as needed., Disp: , Rfl:    cetirizine (ZYRTEC) 10 MG tablet, Take 1 tablet by mouth 2 (two) times a day., Disp: , Rfl:    Cholecalciferol (VITAMIN D3) 250 MCG (10000 UT) capsule, Take 10,000 Units by mouth daily., Disp: , Rfl:    diclofenac (VOLTAREN) 75 MG EC tablet, Take 1 tablet (75 mg total) by mouth 2 (two) times daily as needed., Disp: 60 tablet, Rfl: 2   Fluocinolone Acetonide Scalp (DERMA-SMOOTHE/FS SCALP) 0.01 % OIL, Apply TID, Disp: 118.28 mL, Rfl: 5   Galcanezumab-gnlm (EMGALITY) 120 MG/ML SOAJ, Inject 120 mg into the skin every 30 (thirty) days., Disp: 3 mL, Rfl: 3   HYDROcodone-acetaminophen (NORCO) 5-325 MG tablet, Take 1 tablet by mouth every 6 (six) hours as needed for moderate pain., Disp: 30 tablet, Rfl: 0   lidocaine (LIDODERM) 5 %, Place 1 patch onto the skin daily. Remove & Discard patch within 12 hours or as directed by MD, Disp: 30 patch, Rfl: 2   liothyronine (CYTOMEL) 5 MCG tablet, Take 1 tablet (5 mcg total) by mouth in the morning, at noon, and at bedtime., Disp: 270 tablet, Rfl: 3   lisdexamfetamine (VYVANSE) 50 MG capsule, Take 1 capsule (50 mg total) by mouth daily., Disp: 30 capsule, Rfl: 0   lisdexamfetamine (VYVANSE) 50 MG capsule, Take 1 capsule by mouth daily (fill 01/08/2022), Disp: 30 capsule, Rfl: 0   lisdexamfetamine (VYVANSE) 50 MG capsule, Take 1 capsule (50 mg total) by mouth daily. (12-08-21), Disp: 30 capsule, Rfl: 0   metaxalone (SKELAXIN) 800 MG tablet, TAKE 1 TABLET (800 MG TOTAL) BY MOUTH 3 (THREE) TIMES DAILY AS NEEDED FOR MUSCLE SPASMS., Disp: 90 tablet, Rfl: 2   montelukast (SINGULAIR) 10 MG tablet, TAKE 1 TABLET BY MOUTH AT BEDTIME EVERY DAY, Disp: 90 tablet, Rfl: 3   omeprazole (PRILOSEC) 20 MG capsule, Take 1 capsule (20 mg total) by mouth daily., Disp: 30 capsule, Rfl: 3   promethazine (PHENERGAN) 25 MG tablet, Take 1 tablet (25 mg total) by mouth every 6 (six) hours as needed for nausea or  vomiting., Disp: 30 tablet, Rfl: 3   Rimegepant Sulfate ([name]) 75 MG TBDP, Take 1 tablet by mouth daily as needed., Disp: 30 tablet, Rfl: 2   Rimegepant Sulfate 75 MG TBDP, TAKE 1 TABLET BY MOUTH DAILY AS NEEDED, Disp: 8 tablet, Rfl: 11   traMADol (ULTRAM) 50 MG tablet, Take 2 tablets (100 mg total) by mouth 2 (two) times daily., Disp: 360 tablet, Rfl: 0   traZODone (DESYREL) 50 MG tablet, Take 1-2 tablets (50-100 mg total) by mouth at bedtime  as needed for sleep., Disp: 60 tablet, Rfl: 1  Review of Systems:  Constitutional: Denies fever, chills, diaphoresis, appetite change and fatigue.  HEENT: Denies photophobia, eye pain, redness, hearing loss, ear pain, congestion, sore throat, rhinorrhea, sneezing, mouth sores, trouble swallowing, neck pain, neck stiffness and tinnitus.   Respiratory: Denies SOB, DOE, cough, chest tightness,  and wheezing.   Cardiovascular: Denies chest pain, palpitations and leg swelling.  Gastrointestinal: Denies nausea, vomiting, abdominal pain, diarrhea, constipation, blood in stool and abdominal distention.  Genitourinary: Denies dysuria, urgency, frequency, hematuria, flank pain and difficulty urinating.  Endocrine: Denies: hot or cold intolerance, sweats, changes in hair or nails, polyuria, polydipsia. Musculoskeletal: Denies myalgias, back pain, joint swelling, arthralgias and gait problem.  Skin: Denies pallor, rash and wound.  Neurological: Denies dizziness, seizures, syncope, weakness, light-headedness, numbness and headaches.  Hematological: Denies adenopathy. Easy bruising, personal or family bleeding history  Psychiatric/Behavioral: Denies suicidal ideation, mood changes, confusion, nervousness, sleep disturbance and agitation    Physical Exam: Vitals:   12/14/21 0730  BP: 110/70  Temp: 97.9 F (36.6 C)  TempSrc: Oral  Weight: 188 lb 6.4 oz (85.5 kg)  Height: 5\' 7"  (1.702 m)    Body mass index is 29.51 kg/m.   Constitutional: NAD, calm,  comfortable Eyes: PERRL, lids and conjunctivae normal, wears corrective lenses ENMT: Mucous membranes are moist. Posterior pharynx clear of any exudate or lesions. Normal dentition. Tympanic membrane is pearly white, no erythema or bulging. Neck: normal, supple, no masses, no thyromegaly Respiratory: clear to auscultation bilaterally, no wheezing, no crackles. Normal respiratory effort. No accessory muscle use.  Cardiovascular: Regular rate and rhythm, no murmurs / rubs / gallops. No extremity edema. 2+ pedal pulses. No carotid bruits.  Abdomen: no tenderness, no masses palpated. No hepatosplenomegaly. Bowel sounds positive.  Musculoskeletal: no clubbing / cyanosis. No joint deformity upper and lower extremities. Good ROM, no contractures. Normal muscle tone.  Skin: no rashes, lesions, ulcers. No induration Neurologic: CN 2-12 grossly intact. Sensation intact, DTR normal. Strength 5/5 in all 4.  Psychiatric: Normal judgment and insight. Alert and oriented x 3. Normal mood.    Impression and Plan:  Encounter for preventive health examination -Recommend routine eye and dental care. -Immunizations: Tdap today, she will get COVID booster at pharmacy, otherwise immunizations are up-to-date -Healthy lifestyle discussed in detail. -Labs to be updated today. -Colon cancer screening: 12/2015 -Breast cancer screening: 12/2020 -Cervical cancer screening: Summer/2022 -Lung cancer screening: Not applicable -Prostate cancer screening: Not applicable -DEXA: Not applicable  Hashimoto's disease  - Plan: levothyroxine (SYNTHROID) 112 MCG tablet decreased from 125 mcg in response to an over suppressed TSH, repeat TSH in 6 weeks.  Myofascial pain  Migraine without status migrainosus, not intractable, unspecified migraine type  Need for Tdap vaccination  - Plan: Tdap vaccine greater than or equal to 7yo IM  Encounter for screening mammogram for malignant neoplasm of breast  - Plan: MM Digital  Screening    Patient Instructions  -Nice seeing you today!!  -Decrease levothyroxine to 112 mcg daily. Rx sent. Repeat TSH in 6 weeks.  -Schedule follow up in 6 months.     Lelon Frohlich, MD [facility]

## 2021-12-19 ENCOUNTER — Other Ambulatory Visit (HOSPITAL_COMMUNITY): Payer: Self-pay

## 2021-12-20 ENCOUNTER — Other Ambulatory Visit (HOSPITAL_COMMUNITY): Payer: Self-pay

## 2021-12-28 ENCOUNTER — Other Ambulatory Visit (HOSPITAL_COMMUNITY): Payer: Self-pay

## 2022-01-03 ENCOUNTER — Other Ambulatory Visit (HOSPITAL_COMMUNITY): Payer: Self-pay

## 2022-01-03 ENCOUNTER — Telehealth: Payer: Self-pay | Admitting: Internal Medicine

## 2022-01-03 DIAGNOSIS — F988 Other specified behavioral and emotional disorders with onset usually occurring in childhood and adolescence: Secondary | ICD-10-CM

## 2022-01-03 NOTE — Telephone Encounter (Signed)
Rx is on file at the pharmacy, they will fill it when it is due.

## 2022-01-03 NOTE — Telephone Encounter (Signed)
Pt is calling and needs a refill on lisdexamfetamine (VYVANSE) 50 MG capsule  [facility] Phone:  [phone]  Fax:  [phone]

## 2022-01-04 ENCOUNTER — Other Ambulatory Visit (HOSPITAL_COMMUNITY): Payer: Self-pay

## 2022-01-05 ENCOUNTER — Other Ambulatory Visit (HOSPITAL_COMMUNITY): Payer: Self-pay

## 2022-01-16 ENCOUNTER — Other Ambulatory Visit (HOSPITAL_COMMUNITY): Payer: Self-pay

## 2022-01-22 ENCOUNTER — Other Ambulatory Visit (HOSPITAL_COMMUNITY): Payer: Self-pay

## 2022-01-24 ENCOUNTER — Other Ambulatory Visit: Payer: Self-pay

## 2022-01-24 ENCOUNTER — Ambulatory Visit (INDEPENDENT_AMBULATORY_CARE_PROVIDER_SITE_OTHER): Payer: 59 | Admitting: Psychiatry

## 2022-01-24 ENCOUNTER — Encounter: Payer: Self-pay | Admitting: Psychiatry

## 2022-01-24 DIAGNOSIS — F4310 Post-traumatic stress disorder, unspecified: Secondary | ICD-10-CM
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
## 2022-01-25 ENCOUNTER — Encounter: Payer: Self-pay | Admitting: Psychiatry

## 2022-01-30 NOTE — Progress Notes (Signed)
[name] Phone: [phone] Subjective:   [name], am serving as a scribe for Dr. Hulan Saas.This visit occurred during the SARS-CoV-2 public health emergency.  Safety protocols were in place, including screening questions prior to the visit, additional usage of staff PPE, and extensive cleaning of exam room while observing appropriate contact time as indicated for disinfecting solutions.  I'm seeing this patient by the request  of:  Isaac Bliss, Rayford Halsted, MD  CC: Low back and neck pain follow-up  WIO:[id number]  Barbara Wood is a 52 y.o. female coming in with complaint of back and neck pain. OMT on 12/06/2021. Patient states that she was doing well until last week. Patient had migraine for 4 days.  Patient states that overall has been doing relatively well but now having worsening pain again.  Does seem to be secondary to the weather and stress.         Past Medical History:  Diagnosis Date   Allergy Age 86   Asthma Since age 84   Depression Age 40   Hashimoto's disease    Heart murmur Mitral valve prolapse age 78   Migraine    Psoriasis    Thyroid disease Since age 55    Allergies  Allergen Reactions   Amoxicillin-Pot Clavulanate Nausea And Vomiting   Sertraline Other (See Comments)    hallucinations    Sulfamethoxazole-Trimethoprim Hives   Triptans Other (See Comments)    Chest pain      Review of Systems:  No  visual changes, nausea, vomiting, diarrhea, constipation, dizziness, abdominal pain, skin rash, fevers, chills, night sweats, weight loss, swollen lymph nodes, body aches, joint swelling, chest pain, shortness of breath, mood changes. POSITIVE muscle aches, headache  Objective  Blood pressure 128/78, pulse (!) 59, height 5\' 7"  (1.702 m), weight 194 lb (88 kg), SpO2 99 %.   General: No apparent distress alert and oriented x3 mood and affect normal, dressed appropriately.   HEENT: Pupils equal, extraocular movements intact  Respiratory: Patient's speak in full sentences and does not appear short of breath  Cardiovascular: No lower extremity edema, non tender, no erythema  Neck exam does have some loss of lordosis.  Osteopathic findings  C2 flexed rotated and side bent right C7 flexed rotated and side bent left T3 extended rotated and side bent right inhaled rib T8 extended rotated and side bent left L2 flexed rotated and side bent right Sacrum right on right    Assessment and Plan:  Degenerative disc disease, cervical Patient does have tightness noted.  Discussed which activities of 0 electrolytes to avoid.  Discussed icing regimen.  Does have a different medications for breakthrough if necessary.  Responding well to manipulation and follow-up again in 6 to 8 weeks   Nonallopathic problems  Decision today to treat with OMT was based on Physical Exam  After verbal consent patient was treated with HVLA, ME, FPR techniques in cervical, rib, thoracic, lumbar, and sacral  areas  Patient tolerated the procedure well with improvement in symptoms  Patient given exercises, stretches and lifestyle modifications  See medications in patient instructions if given  Patient will follow up in 4-8 weeks      The above documentation has been reviewed and is accurate and complete Lyndal Pulley, DO        Note: This dictation was prepared with Dragon dictation along with smaller phrase technology. Any transcriptional errors that result from this  process are unintentional.

## 2022-01-31 ENCOUNTER — Other Ambulatory Visit: Payer: Self-pay | Admitting: Internal Medicine

## 2022-01-31 ENCOUNTER — Ambulatory Visit: Payer: 59 | Admitting: Orthopedic Surgery

## 2022-01-31 ENCOUNTER — Ambulatory Visit: Payer: 59 | Admitting: Family Medicine

## 2022-01-31 ENCOUNTER — Other Ambulatory Visit: Payer: Self-pay

## 2022-01-31 VITALS — BP 128/78 | HR 59 | Ht 67.0 in | Wt 194.0 lb

## 2022-01-31 DIAGNOSIS — M9902 Segmental and somatic dysfunction of thoracic region: Secondary | ICD-10-CM

## 2022-01-31 DIAGNOSIS — M9903 Segmental and somatic dysfunction of lumbar region: Secondary | ICD-10-CM | POA: Diagnosis not present

## 2022-01-31 DIAGNOSIS — M5030 Other cervical disc degeneration, unspecified cervical region: Secondary | ICD-10-CM | POA: Diagnosis not present

## 2022-01-31 DIAGNOSIS — M9904 Segmental and somatic dysfunction of sacral region: Secondary | ICD-10-CM

## 2022-01-31 DIAGNOSIS — M9908 Segmental and somatic dysfunction of rib cage: Secondary | ICD-10-CM

## 2022-01-31 DIAGNOSIS — M9901 Segmental and somatic dysfunction of cervical region: Secondary | ICD-10-CM

## 2022-01-31 DIAGNOSIS — F988 Other specified behavioral and emotional disorders with onset usually occurring in childhood and adolescence: Secondary | ICD-10-CM

## 2022-01-31 NOTE — Assessment & Plan Note (Signed)
Patient does have tightness noted.  Discussed which activities of 0 electrolytes to avoid.  Discussed icing regimen.  Does have a different medications for breakthrough if necessary.  Responding well to manipulation and follow-up again in 6 to 8 weeks

## 2022-01-31 NOTE — Patient Instructions (Signed)
Good to see you Don't let patients get to you Stay active See me again in 6-8 weeks

## 2022-01-31 NOTE — Telephone Encounter (Signed)
Patient called in for refill on her Vyvanse 50 mg Patient states she is aware that it is 2-3 days early, however she wanted to ensure she didn't run out.Barbara Wood

## 2022-02-01 ENCOUNTER — Other Ambulatory Visit (HOSPITAL_COMMUNITY): Payer: Self-pay

## 2022-02-01 MED ORDER — LISDEXAMFETAMINE DIMESYLATE 50 MG PO CAPS
50.0000 mg | ORAL_CAPSULE | Freq: Every day | ORAL | 0 refills | Status: DC
  Filled 2022-02-01: qty 30, 30d supply, fill #0

## 2022-02-01 MED ORDER — LISDEXAMFETAMINE DIMESYLATE 50 MG PO CAPS
50.0000 mg | ORAL_CAPSULE | Freq: Every day | ORAL | 0 refills | Status: DC
  Filled 2022-02-01 – 2022-02-02 (×2): qty 30, 30d supply, fill #0

## 2022-02-01 NOTE — Telephone Encounter (Signed)
Rx sent 

## 2022-02-02 ENCOUNTER — Other Ambulatory Visit (HOSPITAL_COMMUNITY): Payer: Self-pay

## 2022-02-06 ENCOUNTER — Other Ambulatory Visit (HOSPITAL_COMMUNITY): Payer: Self-pay

## 2022-02-06 ENCOUNTER — Other Ambulatory Visit: Payer: Self-pay | Admitting: Internal Medicine

## 2022-02-06 DIAGNOSIS — M7918 Myalgia, other site: Secondary | ICD-10-CM

## 2022-02-07 ENCOUNTER — Other Ambulatory Visit (HOSPITAL_COMMUNITY): Payer: Self-pay

## 2022-02-07 MED ORDER — TRAMADOL HCL 50 MG PO TABS
100.0000 mg | ORAL_TABLET | Freq: Two times a day (BID) | ORAL | 0 refills | Status: DC
  Filled 2022-02-07: qty 360, 90d supply, fill #0

## 2022-02-12 ENCOUNTER — Ambulatory Visit: Payer: 59

## 2022-02-14 ENCOUNTER — Other Ambulatory Visit: Payer: Self-pay | Admitting: Internal Medicine

## 2022-02-14 ENCOUNTER — Other Ambulatory Visit (HOSPITAL_COMMUNITY): Payer: Self-pay

## 2022-02-15 ENCOUNTER — Other Ambulatory Visit (HOSPITAL_COMMUNITY): Payer: Self-pay

## 2022-02-15 MED ORDER — MONTELUKAST SODIUM 10 MG PO TABS
10.0000 mg | ORAL_TABLET | Freq: Every day | ORAL | 3 refills | Status: DC
Start: 2022-02-15 — End: 2023-02-06
  Filled 2022-02-15: qty 90, 90d supply, fill #0
  Filled 2022-05-09: qty 90, 90d supply, fill #1
  Filled 2022-08-05: qty 90, 90d supply, fill #2
  Filled 2022-11-06: qty 90, 90d supply, fill #3

## 2022-02-15 NOTE — Telephone Encounter (Signed)
Last filled by: Ronnald Nian, DO

## 2022-02-21 ENCOUNTER — Encounter: Payer: Self-pay | Admitting: Orthopedic Surgery

## 2022-02-21 ENCOUNTER — Ambulatory Visit (INDEPENDENT_AMBULATORY_CARE_PROVIDER_SITE_OTHER): Payer: 59

## 2022-02-21 ENCOUNTER — Other Ambulatory Visit: Payer: Self-pay

## 2022-02-21 ENCOUNTER — Ambulatory Visit: Payer: 59 | Admitting: Orthopedic Surgery

## 2022-02-21 VITALS — Ht 67.0 in | Wt 188.0 lb

## 2022-02-21 DIAGNOSIS — M25561 Pain in right knee: Secondary | ICD-10-CM

## 2022-02-21 DIAGNOSIS — G8929 Other chronic pain: Secondary | ICD-10-CM | POA: Diagnosis not present

## 2022-02-23 ENCOUNTER — Encounter: Payer: Self-pay | Admitting: Orthopedic Surgery

## 2022-02-23 NOTE — Progress Notes (Signed)
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  Patient underwent lateral release at age 18 or 40 which she did well with until the past year.  She does report a history of patellar instability prior to that intervention.  She did well for decades but over the last 6 to 8 months she has reported more knee pain.  Describes a clunk in her knee when going from extension to flexion with ambulation.  Takes occasional diclofenac but reports some generalized fluid retention when she takes this medication..  This happens about twice a week.  She enjoys walking about 2 to 3 miles a day and has lost 35 pounds through walking.  Denies much in the way of effusion in the knee.  There is a long history of autoimmune arthropathy in her family and she believes she may have subclinical psoriatic arthritis at this time.  Pain typically runs around 3-4 level out of 10.  Symptoms are worse in the morning. Patient has tried conservative measures including a home exercise program of exercise as well as medication and activity modification but has had continued symptoms which are overall worsening over the last 6 months.              ROS: All systems reviewed are negative as they relate to the chief complaint within the history of present illness.  Patient denies  fevers or chills.   Assessment & Plan: Visit Diagnoses:  1. Chronic pain of right knee     Plan: Impression is history of patellar instability treated with lateral release which has worked well for the past several decades.  Now Barbara Wood has knee pain with some mechanical symptoms with  ambulation along with difficulty with stairs.  Patient does have bilateral patellofemoral crepitus and may have occult chondral defect on either the patellar or trochlear side.  Radiographs are not definitive in assisting with the diagnosis.  Plan MRI of the right knee to evaluate the extent of the patellofemoral arthritis.  Follow-up after that study.  Overall quad strength is excellent and no obvious anatomic malalignment factors at this time.  Follow-Up Instructions: No follow-ups on file.   Orders:  Orders Placed This Encounter  Procedures   XR KNEE 3 VIEW RIGHT   MR Knee Right w/o contrast   No orders of the defined types were placed in this encounter.     Procedures: No procedures performed   Clinical Data: No additional findings.  Objective: Vital Signs: Ht 5\' 7"  (1.702 m)    Wt 188 lb (85.3 kg)    BMI 29.44 kg/m   Physical Exam:   Constitutional: Patient appears well-developed HEENT:  Head: Normocephalic Eyes:EOM are normal Neck: Normal range of motion Cardiovascular: Normal rate Pulmonary/chest: Effort normal Neurologic: Patient is alert Skin: Skin is warm Psychiatric: Patient has normal mood and affect   Ortho Exam: Ortho exam demonstrates normal gait and alignment.  No groin pain with internal/external rotation of either leg.  Quad strength and tone is excellent bilaterally.  Has patellofemoral crepitus bilaterally  with active extension but significantly less with passive extension.  Negative patellar apprehension on the right.  Extensor mechanism intact and nontender.  Collateral and cruciate ligaments are stable.  No effusion in the right knee.  No joint line tenderness on the right.  Specialty Comments:  No specialty comments available.  Imaging: No results found.   PMFS History: Patient Active Problem List   Diagnosis Date Noted   Right hip pain 04/26/2021   Nonallopathic lesion of cervical region 03/08/2021   Degenerative disc disease, cervical  03/08/2021   Changing skin lesion 07/19/2020   Plantar neuroma, right 12/06/2019   Asthma 07/29/2019   Hashimoto's disease 07/29/2019   Myofascial pain 07/29/2019   Neck pain 07/29/2019   Psoriasis 07/29/2019   Migraine 07/26/1986   Past Medical History:  Diagnosis Date   Allergy Age 21   Asthma Since age 84   Depression Age 70   Hashimoto's disease    Heart murmur Mitral valve prolapse age 89   Migraine    Psoriasis    Thyroid disease Since age 79    Family History  Problem Relation Age of Onset   Asthma Sister    Miscarriages / [place] Sister    Depression Sister    Psoriasis Sister    Cancer Mother    Depression Mother    Hashimoto's thyroiditis Mother    Diabetes Mother    Bipolar disorder Mother    Hypertension Father    Ankylosing spondylitis Father    Psoriasis Father    Psoriasis Brother    Migraines Brother    ADD / ADHD Brother    Eczema Brother    Allergies Brother    Psoriasis Maternal Aunt    Psoriasis Sister    Arthritis Sister        psoriatic arthritis    Psoriasis Sister    Rheum arthritis Sister    Depression Sister    Anxiety disorder Sister    Eczema Sister    Psoriasis Brother    ADD / ADHD Daughter    Eczema Daughter    Asthma Daughter     Past Surgical History:  Procedure Laterality Date   BREAST SURGERY  Reduction age 37   CHOLECYSTECTOMY  Age 38   KNEE ARTHROSCOPY W/ LATERAL RELEASE Right    WISDOM TOOTH EXTRACTION     Social History   Occupational History   Not on file  Tobacco Use   Smoking status: Never   Smokeless tobacco: Never  Vaping Use   Vaping Use: Never used  Substance and Sexual Activity   Alcohol use: Yes    Comment: Rarely   Drug use: Never   Sexual activity: Yes    Comment: Infertile

## 2022-03-01 ENCOUNTER — Other Ambulatory Visit (HOSPITAL_COMMUNITY): Payer: Self-pay

## 2022-03-01 ENCOUNTER — Other Ambulatory Visit: Payer: Self-pay | Admitting: Internal Medicine

## 2022-03-01 DIAGNOSIS — F988 Other specified behavioral and emotional disorders with onset usually occurring in childhood and adolescence: Secondary | ICD-10-CM

## 2022-03-01 MED ORDER — LISDEXAMFETAMINE DIMESYLATE 50 MG PO CAPS
50.0000 mg | ORAL_CAPSULE | Freq: Every day | ORAL | 0 refills | Status: DC
  Filled 2022-03-01: qty 30, 30d supply, fill #0

## 2022-03-01 MED ORDER — LISDEXAMFETAMINE DIMESYLATE 50 MG PO CAPS
50.0000 mg | ORAL_CAPSULE | Freq: Every day | ORAL | 0 refills | Status: DC
  Filled 2022-03-01 – 2022-03-02 (×2): qty 30, 30d supply, fill #0

## 2022-03-01 NOTE — Telephone Encounter (Signed)
Patient called in requesting a refill for lisdexamfetamine (VYVANSE) 50 MG capsule [[id number]] to be sent to her pharmacy. ? ?Please advise. ?

## 2022-03-01 NOTE — Telephone Encounter (Signed)
Last office visit 12/14/21 ?

## 2022-03-02 ENCOUNTER — Other Ambulatory Visit (HOSPITAL_COMMUNITY): Payer: Self-pay

## 2022-03-02 NOTE — Telephone Encounter (Signed)
Medication refilled by PCP.

## 2022-03-12 ENCOUNTER — Other Ambulatory Visit (HOSPITAL_COMMUNITY): Payer: Self-pay

## 2022-03-13 NOTE — Progress Notes (Signed)
?Charlann Boxer D.O. ?[facility] ?[place] ?Phone: [phone] ?Subjective:   ?I, Peterson Lombard, PhD, LAT, ATC acting as a scribe for [employer], DO. ? ?I'm seeing this patient by the request  of:  Isaac Bliss, Rayford Halsted, MD ? ?CC: Back & neck pain ? ?OBS:[id number]  ?Barbara Wood is a 52 y.o. female coming in with complaint of back and neck pain. OMT 01/31/2022. Today, pt reports R shoulder has been bothersome lately. Pt is L-hand dominate.Pt locates pain to the anterior aspect of the R shoulder.  ? ?Aggravates: reaching out for items ? ?Pt notes back and neck are stable. She feels ready for an adjustment to keep her migraines under control. ? ?Medications patient has been prescribed: None ? ?Taking: metaxalone- needs a refill ? ? ?  ? ? ? ? ?Reviewed prior external information including notes and imaging from previsou exam, outside providers and external EMR if available.  ? ?As well as notes that were available from care everywhere and other healthcare systems. ? ?Past medical history, social, surgical and family history all reviewed in electronic medical record.  No pertanent information unless stated regarding to the chief complaint.  ? ?Past Medical History:  ?Diagnosis Date  ? Allergy Age 75  ? Asthma Since age 72  ? Depression Age 55  ? Hashimoto's disease   ? Heart murmur Mitral valve prolapse age 78  ? Migraine   ? Psoriasis   ? Thyroid disease Since age 42  ?  ?Allergies  ?Allergen Reactions  ? Amoxicillin-Pot Clavulanate Nausea And Vomiting  ? Sertraline Other (See Comments)  ?  hallucinations   ? Sulfamethoxazole-Trimethoprim Hives  ? Triptans Other (See Comments)  ?  Chest pain ?  ? ? ? ?Review of Systems: ? No headache, visual changes, nausea, vomiting, diarrhea, constipation, dizziness, abdominal pain, skin rash, fevers, chills, night sweats, weight loss, swollen lymph nodes, body aches, joint swelling, chest pain, shortness of breath, mood changes. POSITIVE  muscle aches ? ?Objective  ?Blood pressure 132/80, pulse 97, height '5\' 7"'$  (1.702 m), weight 195 lb 6.4 oz (88.6 kg), SpO2 98 %. ?  ?General: No apparent distress alert and oriented x3 mood and affect normal, dressed appropriately.  ?HEENT: Pupils equal, extraocular movements intact  ?Respiratory: Patient's speak in full sentences and does not appear short of breath  ?Cardiovascular: No lower extremity edema, non tender, no erythema  ?Right shoulder exam shows some very mild positive impingement.  Positive crossover noted.  Patient does have some tightness noted of the neck.  Positive pain with sidebending bilaterally. ? ?Limited muscular skeletal ultrasound was performed and interpreted by Hulan Saas, M  ?Limited ultrasound of patient's right shoulder shows the patient does have hypoechoic changes in the subscapularis.  Consistent with a bursitis but no true tendon tear also hypoechoic changes in the subacromial area and hypoechoic changes of the acromioclavicular joint no rotator cuff tear is noted. ?Impression: Shoulder bursitis with effusion of the [facility] joint ? ?Osteopathic findings ? ?C2 flexed rotated and side bent right ?C6 flexed rotated and side bent left ?T3 extended rotated and side bent right inhaled rib ?T9 extended rotated and side bent left ?L2 flexed rotated and side bent right ?Sacrum right on right ? ? ? ? ?  ?Assessment and Plan: ? ?Degenerative disc disease, cervical ?Chronic problem with exacerbation.  Increasing tightness.  Patient wanted to try a new muscle relaxer and refill Skelaxin which patient has used in the past.  Patient has Other muscle laxer.  Continue to work on Engineer, building services and weight loss.  Follow-up again with me in 6 to ? ?Right shoulder pain ?Acute onset of right shoulder pain.  On ultrasound today does have more of a subscapularis, subacromial as well as AC effusion noted that is likely causing more of the discomfort and pain.  Patient will try topical  anti-inflammatories, Skelaxin muscle relaxer, home exercises and icing regimen.  Worsening pain will consider injection and formal physical therapy at follow up   ? ?Nonallopathic problems ? ?Decision today to treat with OMT was based on Physical Exam ? ?After verbal consent patient was treated with HVLA, ME, FPR techniques in cervical, rib, thoracic, lumbar, and sacral  areas ? ?Patient tolerated the procedure well with improvement in symptoms ? ?Patient given exercises, stretches and lifestyle modifications ? ?See medications in patient instructions if given ? ?Patient will follow up in 4-8 weeks ? ?  ? ?The above documentation has been reviewed and is accurate and complete Lyndal Pulley, DO ? ? ? ?  ? ? Note: This dictation was prepared with Dragon dictation along with smaller phrase technology. Any transcriptional errors that result from this process are unintentional.    ?  ?  ? ?

## 2022-03-14 ENCOUNTER — Ambulatory Visit: Payer: Self-pay

## 2022-03-14 ENCOUNTER — Ambulatory Visit: Payer: 59 | Admitting: Family Medicine

## 2022-03-14 ENCOUNTER — Other Ambulatory Visit: Payer: Self-pay

## 2022-03-14 ENCOUNTER — Other Ambulatory Visit (HOSPITAL_COMMUNITY): Payer: Self-pay

## 2022-03-14 VITALS — BP 132/80 | HR 97 | Ht 67.0 in | Wt 195.4 lb

## 2022-03-14 DIAGNOSIS — M9908 Segmental and somatic dysfunction of rib cage: Secondary | ICD-10-CM

## 2022-03-14 DIAGNOSIS — M9902 Segmental and somatic dysfunction of thoracic region: Secondary | ICD-10-CM | POA: Diagnosis not present

## 2022-03-14 DIAGNOSIS — M9904 Segmental and somatic dysfunction of sacral region: Secondary | ICD-10-CM | POA: Diagnosis not present

## 2022-03-14 DIAGNOSIS — M9901 Segmental and somatic dysfunction of cervical region: Secondary | ICD-10-CM | POA: Diagnosis not present

## 2022-03-14 DIAGNOSIS — M25511 Pain in right shoulder: Secondary | ICD-10-CM | POA: Diagnosis not present

## 2022-03-14 DIAGNOSIS — M5030 Other cervical disc degeneration, unspecified cervical region: Secondary | ICD-10-CM | POA: Diagnosis not present

## 2022-03-14 DIAGNOSIS — M9903 Segmental and somatic dysfunction of lumbar region: Secondary | ICD-10-CM

## 2022-03-14 MED ORDER — METAXALONE 800 MG PO TABS
800.0000 mg | ORAL_TABLET | Freq: Three times a day (TID) | ORAL | 1 refills | Status: DC
  Filled 2022-03-14 – 2022-04-03 (×4): qty 90, 30d supply, fill #0

## 2022-03-14 NOTE — Assessment & Plan Note (Signed)
Acute onset of right shoulder pain.  On ultrasound today does have more of a subscapularis, subacromial as well as AC effusion noted that is likely causing more of the discomfort and pain.  Patient will try topical anti-inflammatories, Skelaxin muscle relaxer, home exercises and icing regimen.  Worsening pain will consider injection and formal physical therapy at follow up  ?

## 2022-03-14 NOTE — Assessment & Plan Note (Signed)
Chronic problem with exacerbation.  Increasing tightness.  Patient wanted to try a new muscle relaxer and refill Skelaxin which patient has used in the past.  Patient has Other muscle laxer.  Continue to work on Engineer, building services and weight loss.  Follow-up again with me in 6 to ?

## 2022-03-14 NOTE — Patient Instructions (Addendum)
Good to see you ? ?Please complete the exercises that the athletic trainer went over with you:  View at [email or web address] using code: [id number] ? ? ?Keep hands within peripheral vision. ? ?See me again in  8 weeks ?

## 2022-03-15 ENCOUNTER — Other Ambulatory Visit (HOSPITAL_COMMUNITY): Payer: Self-pay

## 2022-03-15 MED ORDER — CARESTART COVID-19 HOME TEST VI KIT
PACK | 0 refills | Status: DC
  Filled 2022-03-15: qty 8, 8d supply, fill #0

## 2022-03-16 ENCOUNTER — Other Ambulatory Visit (HOSPITAL_COMMUNITY): Payer: Self-pay

## 2022-03-19 ENCOUNTER — Ambulatory Visit: Payer: 59

## 2022-03-21 ENCOUNTER — Ambulatory Visit
Admission: RE | Admit: 2022-03-21 | Discharge: 2022-03-21 | Disposition: A | Discharge status: Home / Self Care | Payer: 59 | Source: Ambulatory Visit | Attending: Orthopedic Surgery | Admitting: Orthopedic Surgery

## 2022-03-21 ENCOUNTER — Other Ambulatory Visit (HOSPITAL_COMMUNITY): Payer: Self-pay

## 2022-03-21 ENCOUNTER — Other Ambulatory Visit: Payer: Self-pay

## 2022-03-21 DIAGNOSIS — G8929 Other chronic pain: Secondary | ICD-10-CM

## 2022-03-21 DIAGNOSIS — M7121 Synovial cyst of popliteal space [Baker], right knee: Secondary | ICD-10-CM | POA: Diagnosis not present

## 2022-03-22 ENCOUNTER — Ambulatory Visit (INDEPENDENT_AMBULATORY_CARE_PROVIDER_SITE_OTHER): Payer: 59 | Admitting: Psychiatry

## 2022-03-22 DIAGNOSIS — F4310 Post-traumatic stress disorder, unspecified: Secondary | ICD-10-CM | POA: Diagnosis not present

## 2022-03-22 NOTE — Progress Notes (Signed)
?      [facility] Counselor/Therapist Progress Note ? ?Patient ID: Barbara Wood, MRN: [id number],   ? ?Date: 03/22/2022 ? ?Time Spent: 45 minutes start time 12:07 PM end time 12:52 PM ? ?Treatment Type: Individual Therapy ? ?Reported Symptoms: focusing issues, triggered responses, anxiety ? ?Mental Status Exam: ? ?Appearance:   Well Groomed     ?Behavior:  Appropriate  ?Motor:  Normal  ?Speech/Language:   Normal Rate  ?Affect:  Appropriate  ?Mood:  normal  ?Thought process:  normal  ?Thought content:    WNL  ?Sensory/Perceptual disturbances:    WNL  ?Orientation:  oriented to person, place, time/date, and situation  ?Attention:  Good  ?Concentration:  Good  ?Memory:  WNL  ?Fund of knowledge:   Good  ?Insight:    Good  ?Judgment:   Good  ?Impulse Control:  Good  ? ?Risk Assessment: ?Danger to Self:  No ?Self-injurious Behavior: No ?Danger to Others: No ?Duty to Warn:no ?Physical Aggression / Violence:No  ?Access to Firearms a concern: No  ?Gang Involvement:No  ? ?Subjective: Patient was present for session.  Discussed goals for treatment and developed a treatment plan.  Patient started doing processing with EMDR set on husband not scheduling mediation suds level 6, negative cognition "I am trapped" felt frustration in her shoulders and chest.  Patient was able to reduce suds level to 2.  She was able to resolve the set and develop visualizations to help anything that surfaces between sessions.  I also taught patient grounding exercises to use if emotions surface between sessions. ? ?Interventions: Solution-Oriented/Positive Psychology, Eye Movement Desensitization and Reprocessing (EMDR), and Insight-Oriented ? ?Diagnosis: ?  ICD-10-CM   ?1. PTSD (post-traumatic stress disorder)  F43.10   ?  ? ? ?Plan: Patient is to use coping skills to decrease triggered responses.  Patient is to look up brain spotting relaxation exercise Vergence.  The importance of allowing processing to continue between sessions was discussed with  patient and she was able to develop plans.   ?Long-term goal: Develop healthy boundaries ?Short-term goal: Resolve the trauma from past ?Give her daughter more stability ?Trying to navigate divorce and have good boundaries ?Deal with childhood trauma ?Figure out relationship with [name] ? ? ?[name], [facility] [address] ? ? ? ? ? ? ? ? ? ? ? ? ? ? ? ? ? ? ?

## 2022-03-23 ENCOUNTER — Other Ambulatory Visit (HOSPITAL_COMMUNITY): Payer: Self-pay

## 2022-03-24 NOTE — Progress Notes (Signed)
Can u arrange f/u thx

## 2022-03-26 NOTE — Progress Notes (Signed)
Lovena Le will call to schedule ?

## 2022-03-28 ENCOUNTER — Other Ambulatory Visit (HOSPITAL_COMMUNITY): Payer: Self-pay

## 2022-04-02 ENCOUNTER — Other Ambulatory Visit (HOSPITAL_COMMUNITY): Payer: Self-pay

## 2022-04-02 ENCOUNTER — Other Ambulatory Visit: Payer: Self-pay | Admitting: Internal Medicine

## 2022-04-02 DIAGNOSIS — F988 Other specified behavioral and emotional disorders with onset usually occurring in childhood and adolescence: Secondary | ICD-10-CM

## 2022-04-02 MED ORDER — LISDEXAMFETAMINE DIMESYLATE 50 MG PO CAPS
50.0000 mg | ORAL_CAPSULE | Freq: Every day | ORAL | 0 refills | Status: DC
  Filled 2022-04-02: qty 30, 30d supply, fill #0

## 2022-04-02 MED ORDER — LISDEXAMFETAMINE DIMESYLATE 50 MG PO CAPS
50.0000 mg | ORAL_CAPSULE | Freq: Every day | ORAL | 0 refills | Status: DC
  Filled 2022-04-02 – 2022-05-02 (×2): qty 30, 30d supply, fill #0

## 2022-04-02 NOTE — Telephone Encounter (Signed)
Last office visit 12/14/21. Okay to pend? ?

## 2022-04-02 NOTE — Addendum Note (Signed)
Addended by: Westley Hummer B on: 04/02/2022 04:18 PM ? ? Modules accepted: Orders ? ?

## 2022-04-02 NOTE — Telephone Encounter (Signed)
Patient called in requesting for a medication refill for lisdexamfetamine (VYVANSE) 50 MG capsule [[id number]]  to be sent to her pharmacy. ? ?Please advise ?

## 2022-04-03 ENCOUNTER — Other Ambulatory Visit (HOSPITAL_COMMUNITY): Payer: Self-pay

## 2022-04-03 ENCOUNTER — Ambulatory Visit (INDEPENDENT_AMBULATORY_CARE_PROVIDER_SITE_OTHER): Payer: 59 | Admitting: Psychiatry

## 2022-04-03 DIAGNOSIS — F4310 Post-traumatic stress disorder, unspecified: Secondary | ICD-10-CM

## 2022-04-03 NOTE — Progress Notes (Signed)
?      [facility] Counselor/Therapist Progress Note ? ?Patient ID: Barbara Wood, MRN: [id number],   ? ?Date: 04/03/2022 ? ?Time Spent: 55 minutes start time 2:03 PM end time 2:58 PM ? ?Treatment Type: Individual Therapy ? ?Reported Symptoms: Anxiety, frustration, triggered responses, sadness ? ?Mental Status Exam: ? ?Appearance:   Well Groomed     ?Behavior:  Appropriate  ?Motor:  Normal  ?Speech/Language:   Normal Rate  ?Affect:  Appropriate  ?Mood:  normal  ?Thought process:  normal  ?Thought content:    WNL  ?Sensory/Perceptual disturbances:    WNL  ?Orientation:  oriented to person, place, time/date, and situation  ?Attention:  Good  ?Concentration:  Good  ?Memory:  WNL  ?Fund of knowledge:   Good  ?Insight:    Good  ?Judgment:   Good  ?Impulse Control:  Good  ? ?Risk Assessment: ?Danger to Self:  No ?Self-injurious Behavior: No ?Danger to Others: No ?Duty to Warn:no ?Physical Aggression / Violence:No  ?Access to Firearms a concern: No  ?Gang Involvement:No  ? ?Subjective: Patient was present for session.  She shared that she is having issues with her daughter.  Patient shared some of the things that are happening with her and how that is bringing up concerns for her daughter as well as reminding of her of things from childhood.  Patient did processing set on daughter lying, suds level 9, negative cognition "I am doing something wrong" felt sadness and anxiety in her eyes and shoulders.  Patient was able to reduce suds level to 2.  She was able to recognize that she was doing the right thing as a parent even though it is difficult.  Discussed some of the different dynamics within the relationship with her ex daughter and herself and some ways to manage those things.  Also discussed different strategies to help daughter deal with emotions appropriately so they can work through some of these issues. ? ?Interventions: Solution-Oriented/Positive Psychology, Eye Movement Desensitization and Reprocessing (EMDR), and  Insight-Oriented ? ?Diagnosis: ?  ICD-10-CM   ?1. PTSD (post-traumatic stress disorder)  F43.10   ?  ? ? ?Plan: Patient is to use coping skills to decrease triggered responses.  Patient is to look up brain spotting r bilateral music.  Patient is to allow processing to continue between sessions .  Patient is to follow plans from session to deal with the situation with her daughter and her ex. ?Long-term goal: Develop healthy boundaries ?Short-term goal: Resolve the trauma from past ?Give her daughter more stability ?Trying to navigate divorce and have good boundaries ?Deal with childhood trauma ?Figure out relationship with [name] ? ?[name], [facility] ? ? ? ? ? ? ? ? ? ? ? ? ? ? ? ? ? ? ?

## 2022-04-03 NOTE — Telephone Encounter (Signed)
Refill sent.

## 2022-04-04 ENCOUNTER — Other Ambulatory Visit (HOSPITAL_COMMUNITY): Payer: Self-pay

## 2022-04-11 ENCOUNTER — Encounter: Payer: Self-pay | Admitting: Podiatry

## 2022-04-11 ENCOUNTER — Ambulatory Visit
Admission: RE | Admit: 2022-04-11 | Discharge: 2022-04-11 | Disposition: A | Discharge status: Home / Self Care | Payer: 59 | Source: Ambulatory Visit | Attending: Internal Medicine | Admitting: Internal Medicine

## 2022-04-11 ENCOUNTER — Ambulatory Visit: Payer: 59 | Admitting: Podiatry

## 2022-04-11 DIAGNOSIS — Z1231 Encounter for screening mammogram for malignant neoplasm of breast: Secondary | ICD-10-CM | POA: Diagnosis not present

## 2022-04-11 DIAGNOSIS — M7751 Other enthesopathy of right foot: Secondary | ICD-10-CM

## 2022-04-11 DIAGNOSIS — L600 Ingrowing nail: Secondary | ICD-10-CM

## 2022-04-11 NOTE — Progress Notes (Signed)
Subjective:  ? ?Patient ID: Courtney Heys, female   DOB: 52 y.o.   MRN: [id number]  ? ?HPI ?Patient presents stating she is developed a lot of pain in her fourth digit right and she has chronic ingrown toenails of the medial border of both big toes that have been worked on and only resolves the problem temporarily with pedicures ? ? ?ROS ? ? ?   ?Objective:  ?Physical Exam  ?Neurovascular status intact with inflammation of the lateral inner phalangeal joint digit 4 right with keratotic tissue formation mild rotation of the fifth digit right and also incurvated medial borders hallux bilateral ? ?   ?Assessment:  ?Inflammatory capsulitis of the fourth inner phalangeal joint right lateral side along with hammertoe deformity keratotic tissue formation and ingrown toenails ? ?   ?Plan:  ?H&P reviewed all conditions today did careful steroid injection of the inner phalangeal joint lateral side 2 mg Dexasone Kenalog 5 mg Xylocaine debrided lesion applied padding and hopefully this will solve that problem with possibility for surgery if it does not.  I then went ahead discussed the ingrown toenail she wants to get them corrected and will reach a point on a Wednesday afternoon when she was off in order to get this done ?   ? ? ?

## 2022-04-12 ENCOUNTER — Ambulatory Visit: Payer: 59 | Admitting: Orthopedic Surgery

## 2022-04-12 DIAGNOSIS — M25561 Pain in right knee: Secondary | ICD-10-CM | POA: Diagnosis not present

## 2022-04-12 DIAGNOSIS — G8929 Other chronic pain: Secondary | ICD-10-CM

## 2022-04-14 ENCOUNTER — Encounter: Payer: Self-pay | Admitting: Orthopedic Surgery

## 2022-04-14 NOTE — Progress Notes (Signed)
? ?Office Visit Note ?  ?Patient: Barbara Wood           ?Date of Birth: 02/27/1970           ?MRN: [id number] ?Visit Date: 04/12/2022 ?Requested by: Isaac Bliss, Rayford Halsted, MD ?[address] ?[place],  [place] [id number] ?PCP: Isaac Bliss, Rayford Halsted, MD ? ?Subjective: ?Chief Complaint  ?Patient presents with  ? Other  ?   ?Scan review ?  ? ? 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  Since she was last seen she has had an MRI scan.  This does show some patellofemoral arthritis.  Her symptoms are better when the weather is ?Warmer.  She is able to warm up the knee to a better level of function.  She does have to walk a lot.             ?ROS: All systems reviewed are negative as they relate to the chief complaint within the history of present illness.  Patient denies  fevers or chills. ? ? ?Assessment & Plan: ?Visit Diagnoses:  ?1. Chronic pain of right knee   ? ? 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  Menisci ligaments intact.  We will try cortisone injection as needed.  Continue with stretching and quad strengthening exercises.  No surgical indication at this time.  Follow-up as needed ? ?Follow-Up Instructions: Return if symptoms worsen or fail to improve.  ? ?Orders:  ?No orders of the defined types were placed in this encounter. ? ?No orders of the defined types were placed in this encounter. ? ? ? ? Procedures: ?No procedures performed ? ? ?Clinical Data: ?No additional findings. ? ?Objective: ?Vital Signs: There were no vitals taken for this visit. ? ?Physical Exam:  ? ?Constitutional: Patient appears well-developed ?HEENT:  ?Head: Normocephalic ?Eyes:EOM are normal ?Neck: Normal range of motion ?Cardiovascular: Normal rate ?Pulmonary/chest: Effort normal ?Neurologic: Patient is alert ?Skin: Skin is warm ?Psychiatric: Patient has normal mood and affect ? ? ?Ortho Exam: Ortho exam demonstrates  full active and passive range of motion of the right knee.  Mild patellofemoral crepitus is present but with no apprehension.  Patella tracks well.  No increased Q angle.  No medial or lateral joint line tenderness. ? ?Specialty Comments:  ?No specialty comments available. ? ?Imaging: ?No results found. ? ? ?PMFS History: ?Patient Active Problem List  ? Diagnosis Date Noted  ? Right shoulder pain 03/14/2022  ? Right hip pain 04/26/2021  ? Nonallopathic lesion of cervical region 03/08/2021  ? Degenerative disc disease, cervical 03/08/2021  ? Changing skin lesion 07/19/2020  ? 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 Asthma 07/29/2019  ? Hashimoto's disease 07/29/2019  ? Myofascial pain 07/29/2019  ? Neck pain 07/29/2019  ? Psoriasis 07/29/2019  ? Migraine 07/26/1986  ? ?Past Medical History:  ?Diagnosis Date  ? Allergy Age 75  ? Asthma Since age 37  ? Depression Age 38  ? Hashimoto's disease   ? Heart murmur Mitral valve prolapse age 7  ? Migraine   ? Psoriasis   ? Thyroid disease Since age 56  ?  ?Family History  ?Problem Relation Age of Onset  ? Asthma Sister   ? Miscarriages / Stillbirths Sister   ? Depression Sister   ? Psoriasis Sister   ? Cancer Mother   ? Depression Mother   ? Hashimoto's thyroiditis Mother   ? Diabetes Mother   ?  Bipolar disorder Mother   ? Hypertension Father   ? Ankylosing spondylitis Father   ? Psoriasis Father   ? Psoriasis Brother   ? Migraines Brother   ? ADD / ADHD Brother   ? Eczema Brother   ? Allergies Brother   ? Psoriasis Maternal Aunt   ? Psoriasis Sister   ? Arthritis Sister   ?     psoriatic arthritis   ? Psoriasis Sister   ? Rheum arthritis Sister   ? Depression Sister   ? Anxiety disorder Sister   ? Eczema Sister   ? Psoriasis Brother   ? ADD / ADHD Daughter   ? Eczema Daughter   ? Asthma Daughter   ?  ?Past Surgical History:  ?Procedure Laterality Date  ? BREAST SURGERY  Reduction age 82  ? CHOLECYSTECTOMY  Age 38  ? KNEE ARTHROSCOPY W/ LATERAL RELEASE Right   ? REDUCTION  MAMMAPLASTY    ? WISDOM TOOTH EXTRACTION    ? ?Social History  ? ?Occupational History  ? Not on file  ?Tobacco Use  ? Smoking status: Never  ? Smokeless tobacco: Never  ?Vaping Use  ? Vaping Use: Never used  ?Substance and Sexual Activity  ? Alcohol use: Yes  ?  Comment: Rarely  ? Drug use: Never  ? Sexual activity: Yes  ?  Comment: Infertile  ? ? ? ? ? ?

## 2022-04-17 ENCOUNTER — Other Ambulatory Visit (HOSPITAL_COMMUNITY): Payer: Self-pay

## 2022-04-18 ENCOUNTER — Ambulatory Visit (INDEPENDENT_AMBULATORY_CARE_PROVIDER_SITE_OTHER): Payer: 59 | Admitting: Psychiatry

## 2022-04-18 DIAGNOSIS — F4310 Post-traumatic stress disorder, unspecified: Secondary | ICD-10-CM

## 2022-04-18 NOTE — Progress Notes (Signed)
?      [facility] Counselor/Therapist Progress Note ? ?Patient ID: Barbara Wood, MRN: [id number],   ? ?Date: 04/18/2022 ? ?Time Spent: 50 minutes start time 1:06 PM end time  1:56 PM ? ?Treatment Type: Individual Therapy ? ?Reported Symptoms: anxiety, triggered responses  ? ?Mental Status Exam: ? ?Appearance:   Well Groomed     ?Behavior:  Appropriate  ?Motor:  Normal  ?Speech/Language:   Normal Rate  ?Affect:  Appropriate  ?Mood:  anxious  ?Thought process:  normal  ?Thought content:    WNL  ?Sensory/Perceptual disturbances:    WNL  ?Orientation:  oriented to person, place, time/date, and situation  ?Attention:  Good  ?Concentration:  Good  ?Memory:  WNL  ?Fund of knowledge:   Good  ?Insight:    Good  ?Judgment:   Good  ?Impulse Control:  Good  ? ?Risk Assessment: ?Danger to Self:  No ?Self-injurious Behavior: No ?Danger to Others: No ?Duty to Warn:no ?Physical Aggression / Violence:No  ?Access to Firearms a concern: No  ?Gang Involvement:No  ? ?Subjective: Patient was present for session.  She shared that things are going much better with her daughter.  Patient shared she feels that she is managing her own emotions much better after last session's work.  Patient stated she is getting ready to go for mediation and wanted to talk through some ways to manage things appropriately in the meeting.  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  Patient reported feeling positive about that idea.  She went on to share that she wants to process things with her current relationship, suds level 7, negative cognition "I did something wrong" felt anxiety and chest.  Patient was only able to reduce suds level to 6.  She struggled with staying focusing with the exercise.  Agreed to go back to EMDR at next session. ? ?Interventions: Cognitive Behavioral Therapy, Solution-Oriented/Positive Psychology, and BS P ? ?Diagnosis: ?   ICD-10-CM   ?1. PTSD (post-traumatic stress disorder)  F43.10   ?  ? ? ?Plan: Patient is to use coping skills to decrease triggered responses.  Patient is to look up brain spotting bilateral music.  Patient is to allow processing to continue between sessions .  Patient is to follow plans from session to deal with the situation with her ex. ?Long-term goal: Develop healthy boundaries ?Short-term goal: Resolve the trauma from past ?Give her daughter more stability ?Trying to navigate divorce and have good boundaries ?Deal with childhood trauma ?Figure out relationship with [name] ? ?[name], [facility] ? ? ? ? ? ? ? ? ? ? ? ? ? ? ? ? ? ? ?

## 2022-04-19 ENCOUNTER — Other Ambulatory Visit (HOSPITAL_COMMUNITY): Payer: Self-pay

## 2022-04-24 ENCOUNTER — Other Ambulatory Visit (HOSPITAL_COMMUNITY): Payer: Self-pay

## 2022-04-24 ENCOUNTER — Telehealth: Payer: Self-pay | Admitting: Internal Medicine

## 2022-04-24 NOTE — Progress Notes (Signed)
?Charlann Boxer D.O. ?[facility] ?[place] ?Phone: [phone] ?Subjective:   ?I, Vilma Meckel, am serving as a Education administrator for Dr. Hulan Saas. ?This visit occurred during the SARS-CoV-2 public health emergency.  Safety protocols were in place, including screening questions prior to the visit, additional usage of staff PPE, and extensive cleaning of exam room while observing appropriate contact time as indicated for disinfecting solutions.  ? ?I'm seeing this patient by the request  of:  Isaac Bliss, Rayford Halsted, MD ? ?CC: neck and back pain  ? ?[id number]  ?Barbara Wood is a 52 y.o. female coming in with complaint of back and neck pain. OMT  on 03/14/2022. Also seen for right shoulder pain. Patient states gets a lot of overuse injuries. Having a little tendonitis on the left. Right shoulder is about 15-20% better. No other complaints. ? ?Medications patient has been prescribed: Skilaxin ? ?Taking: ? ? ?  ? ? ? ? ?Reviewed prior external information including notes and imaging from previsou exam, outside providers and external EMR if available.  ? ?As well as notes that were available from care everywhere and other healthcare systems. ? ?Past medical history, social, surgical and family history all reviewed in electronic medical record.  No pertanent information unless stated regarding to the chief complaint.  ? ?Past Medical History:  ?Diagnosis Date  ? Allergy Age 68  ? Asthma Since age 28  ? Depression Age 64  ? Hashimoto's disease   ? Heart murmur Mitral valve prolapse age 28  ? Migraine   ? Psoriasis   ? Thyroid disease Since age 63  ?  ?Allergies  ?Allergen Reactions  ? Amoxicillin-Pot Clavulanate Nausea And Vomiting  ? Sertraline Other (See Comments)  ?  hallucinations   ? Sulfamethoxazole-Trimethoprim Hives  ? Triptans Other (See Comments)  ?  Chest pain ?  ? ? ? ?Review of Systems: ? No , visual changes, nausea, vomiting, diarrhea, constipation, dizziness, abdominal  pain, skin rash, fevers, chills, night sweats, weight loss, swollen lymph nodes, body aches, joint swelling, chest pain, shortness of breath, mood changes. POSITIVE muscle aches, body aches, headache  ? ?Objective  ?Blood pressure 104/70, pulse 84, height '5\' 7"'$  (1.702 m), weight 195 lb (88.5 kg), SpO2 97 %. ?  ?General: No apparent distress alert and oriented x3 mood and affect normal, dressed appropriately. Wearing sunglasses due to headache and migraine  ?HEENT: Pupils equal, extraocular movements intact  ?Respiratory: Patient's speak in full sentences and does not appear short of breath  ?Cardiovascular: No lower extremity edema, non tender, no erythema  ?Neck exam has some mild loss of lordosis and mild limit side bending  ?Low back has loss of lordosis and tightness of hip flexor  ? ? ?Osteopathic findings ? ?C2 flexed rotated and side bent right ?C5 flexed rotated and side bent left ?T3 extended rotated and side bent right inhaled rib ?T7 extended rotated and side bent left ?L2 flexed rotated and side bent right ?Sacrum right on right ? ? ? ? ?  ?Assessment and Plan: ? ?Migraine ?Patient is having a migraine at this moment.  We discussed about a Toradol and Depo-Medrol injection.  Patient declined at the moment.  Discussed icing regimen and home exercises, which activities to do which ones to avoid.  We will try to send a message to primary care to see if they can get the migraine medication approved due to patient being elevated at the moment.  Follow-up with me  again in 6 weeks otherwise. ? ?Degenerative disc disease, cervical ?Chronic problem with mild exacerbation, some of it could be secondary to headache.  Responds well to manipulation.  Has had muscle relaxers if needed as well as oral anti-inflammatories intermittently.  Patient wants to stay away from any type of steroids with patient having an A1c at 6.0.  Follow-up with me again 6 to 8 weeks  ? ?Nonallopathic problems ? ?Decision today to treat with  OMT was based on Physical Exam ? ?After verbal consent patient was treated with HVLA, ME, FPR techniques in cervical, rib, thoracic, lumbar, and sacral  areas ? ?Patient tolerated the procedure well with improvement in symptoms ? ?Patient given exercises, stretches and lifestyle modifications ? ?See medications in patient instructions if given ? ?Patient will follow up in 4-8 weeks ? ?  ? ? ?The above documentation has been reviewed and is accurate and complete Lyndal Pulley, DO ? ? ? ?  ? ? Note: This dictation was prepared with Dragon dictation along with smaller phrase technology. Any transcriptional errors that result from this process are unintentional.    ?  ?  ? ?

## 2022-04-24 NOTE — Telephone Encounter (Signed)
Pt calling in requesting that the PA for Galcanezumab-gnlm [facility]) 120 MG/ML SOAJ this medication be handled asap as the pharmacy sent the request for pa to the incorrect provider. Pt needs to get the shot today but does not have the medication because of the mix-up ?

## 2022-04-25 ENCOUNTER — Ambulatory Visit: Payer: 59 | Admitting: Family Medicine

## 2022-04-25 ENCOUNTER — Other Ambulatory Visit (HOSPITAL_COMMUNITY): Payer: Self-pay

## 2022-04-25 VITALS — BP 104/70 | HR 84 | Ht 67.0 in | Wt 195.0 lb

## 2022-04-25 DIAGNOSIS — M5030 Other cervical disc degeneration, unspecified cervical region: Secondary | ICD-10-CM

## 2022-04-25 DIAGNOSIS — M9908 Segmental and somatic dysfunction of rib cage: Secondary | ICD-10-CM

## 2022-04-25 DIAGNOSIS — M9904 Segmental and somatic dysfunction of sacral region: Secondary | ICD-10-CM | POA: Diagnosis not present

## 2022-04-25 DIAGNOSIS — M9903 Segmental and somatic dysfunction of lumbar region: Secondary | ICD-10-CM

## 2022-04-25 DIAGNOSIS — M9902 Segmental and somatic dysfunction of thoracic region: Secondary | ICD-10-CM

## 2022-04-25 DIAGNOSIS — G43909 Migraine, unspecified, not intractable, without status migrainosus: Secondary | ICD-10-CM | POA: Diagnosis not present

## 2022-04-25 DIAGNOSIS — M9901 Segmental and somatic dysfunction of cervical region: Secondary | ICD-10-CM | POA: Diagnosis not present

## 2022-04-25 NOTE — Telephone Encounter (Signed)
Last filled by Ronnald Nian, DO.  Okay to refill and do a PA? ?

## 2022-04-25 NOTE — Telephone Encounter (Signed)
Pt call and stated she want a call back when done. ?

## 2022-04-25 NOTE — Assessment & Plan Note (Signed)
Chronic problem with mild exacerbation, some of it could be secondary to headache.  Responds well to manipulation.  Has had muscle relaxers if needed as well as oral anti-inflammatories intermittently.  Patient wants to stay away from any type of steroids with patient having an A1c at 6.0.  Follow-up with me again 6 to 8 weeks ?

## 2022-04-25 NOTE — Telephone Encounter (Signed)
PA was started for  ?Emaglity ?Key: [id number] ?

## 2022-04-25 NOTE — Assessment & Plan Note (Signed)
Patient is having a migraine at this moment.  We discussed about a Toradol and Depo-Medrol injection.  Patient declined at the moment.  Discussed icing regimen and home exercises, which activities to do which ones to avoid.  We will try to send a message to primary care to see if they can get the migraine medication approved due to patient being elevated at the moment.  Follow-up with me again in 6 weeks otherwise. ?

## 2022-04-26 NOTE — Telephone Encounter (Signed)
[employer] is reviewing your PA request.  ? ?To check for an update later, open this request again from your dashboard. If [employer] has not replied within 24 hours for urgent requests or within 48 hours for standard requests, please contact [employer] at [phone]. ?

## 2022-04-26 NOTE — Telephone Encounter (Signed)
Pt is aware PA has been started and would like a callback once rx has been approve or denied ?

## 2022-04-27 ENCOUNTER — Other Ambulatory Visit (HOSPITAL_COMMUNITY): Payer: Self-pay

## 2022-04-27 NOTE — Telephone Encounter (Signed)
Checked covermymeds: waiting for determination. ? ?Pt states insurance company faxed request for more paperwork to office. Fax retrieved; paperwork completed with pt on phone for reference of questions. Faxed to [phone]. Successful confirmation received.  ? ? ?

## 2022-04-27 NOTE — Telephone Encounter (Signed)
Pt is calling requesting that someone call in and mark her PA as urgent as she has been without her medication and suffering from a migraine all week. Pt request a call for confirmation. Pt is in pain and crying  ?

## 2022-04-29 ENCOUNTER — Other Ambulatory Visit: Payer: Self-pay | Admitting: Internal Medicine

## 2022-04-29 DIAGNOSIS — G4700 Insomnia, unspecified: Secondary | ICD-10-CM

## 2022-04-30 ENCOUNTER — Other Ambulatory Visit (HOSPITAL_COMMUNITY): Payer: Self-pay

## 2022-04-30 MED ORDER — TRAZODONE HCL 50 MG PO TABS
50.0000 mg | ORAL_TABLET | Freq: Every evening | ORAL | 1 refills | Status: DC | PRN
  Filled 2022-04-30: qty 60, 30d supply, fill #0
  Filled 2022-05-31: qty 60, 30d supply, fill #1

## 2022-04-30 NOTE — Telephone Encounter (Signed)
Prior Auth was approved 04/29/22 - 04/29/23.  Patient is aware. ?

## 2022-05-01 ENCOUNTER — Other Ambulatory Visit (HOSPITAL_COMMUNITY): Payer: Self-pay

## 2022-05-02 ENCOUNTER — Other Ambulatory Visit (HOSPITAL_COMMUNITY): Payer: Self-pay

## 2022-05-02 ENCOUNTER — Ambulatory Visit (INDEPENDENT_AMBULATORY_CARE_PROVIDER_SITE_OTHER): Payer: 59 | Admitting: Psychiatry

## 2022-05-02 DIAGNOSIS — F4310 Post-traumatic stress disorder, unspecified: Secondary | ICD-10-CM | POA: Diagnosis not present

## 2022-05-02 NOTE — Progress Notes (Signed)
?      [facility] Counselor/Therapist Progress Note ? ?Patient ID: Barbara Wood, MRN: [id number],   ? ?Date: 05/02/2022 ? ?Time Spent: 50 minutes start time 4:06 PM end time 4:56 PM ? ?Treatment Type: Individual Therapy ? ?Reported Symptoms: anxiety, triggered responses ? ?Mental Status Exam: ? ?Appearance:   Well Groomed     ?Behavior:  Appropriate  ?Motor:  Normal  ?Speech/Language:   Normal Rate  ?Affect:  Appropriate  ?Mood:  normal  ?Thought process:  normal  ?Thought content:    WNL  ?Sensory/Perceptual disturbances:    WNL  ?Orientation:  oriented to person, place, time/date, and situation  ?Attention:  Good  ?Concentration:  Good  ?Memory:  WNL  ?Fund of knowledge:   Good  ?Insight:    Good  ?Judgment:   Good  ?Impulse Control:  Good  ? ?Risk Assessment: ?Danger to Self:  No ?Self-injurious Behavior: No ?Danger to Others: No ?Duty to Warn:no ?Physical Aggression / Violence:No  ?Access to Firearms a concern: No  ?Gang Involvement:No  ? ?Subjective: Patient was present for session.  She shared that she had responded well to last session and things were going well with her boyfriend.  Had patient think through what she needed to address in session.  She was able to identify that Mother's Day was always very triggering for her since she does not communicate with her mother due to her mother's behaviors and the daughter she adopted when daughter was 24 is not talking to her currently.  Did processing set on daughter not talking to her, suds level 9 negative cognition "I am my mother" felt sadness in her eyes shoulders and stomach.  Patient was able to reduce suds level to 3.  She was able to realize situations are very different and she is not like her mother.  She was also able to see that her daughter is in the situation where she may not be able to communicate with her the way that she wants due to having several kids and being very dependent upon her husband in [place].  Patient was encouraged to remind herself  of the truth/facts and work on enjoying her daughter on Mother's Day ? ?Interventions: Cognitive Behavioral Therapy, Solution-Oriented/Positive Psychology, Eye Movement Desensitization and Reprocessing (EMDR), and Insight-Oriented ? ?Diagnosis: ?  ICD-10-CM   ?1. PTSD (post-traumatic stress disorder)  F43.10   ?  ? ? ?Plan: Patient is to use coping skills to decrease triggered responses.  Patient is to look up brain spotting bilateral music.  Patient is to allow processing to continue between sessions .  Patient is to enjoy her daughter during Mother's Day.  Patient is to release negative emotions through exercise  ?Long-term goal: Develop healthy boundaries ?Short-term goal: Resolve the trauma from past ?Give her daughter more stability ?Trying to navigate divorce and have good boundaries ?Deal with childhood trauma ?Figure out relationship with [name] ? ?Barbara Wood, [facility] ? ? ? ? ? ? ? ? ? ? ? ? ? ? ? ? ? ? ?

## 2022-05-09 ENCOUNTER — Other Ambulatory Visit (HOSPITAL_COMMUNITY): Payer: Self-pay

## 2022-05-11 ENCOUNTER — Ambulatory Visit: Payer: 59 | Admitting: Family Medicine

## 2022-05-11 ENCOUNTER — Encounter: Payer: Self-pay | Admitting: Family Medicine

## 2022-05-11 ENCOUNTER — Other Ambulatory Visit (HOSPITAL_COMMUNITY): Payer: Self-pay

## 2022-05-11 VITALS — BP 100/60 | HR 75 | Temp 98.1°F | Ht 67.0 in | Wt 195.7 lb

## 2022-05-11 DIAGNOSIS — R300 Dysuria: Secondary | ICD-10-CM

## 2022-05-11 DIAGNOSIS — N898 Other specified noninflammatory disorders of vagina: Secondary | ICD-10-CM

## 2022-05-11 LAB — POC URINALSYSI DIPSTICK (AUTOMATED)
Bilirubin, UA: NEGATIVE
Blood, UA: NEGATIVE
Glucose, UA: NEGATIVE
Ketones, UA: NEGATIVE
Leukocytes, UA: NEGATIVE
Nitrite, UA: NEGATIVE
Protein, UA: NEGATIVE
Spec Grav, UA: 1.02 (ref 1.010–1.025)
Urobilinogen, UA: 0.2 E.U./dL
pH, UA: 6 (ref 5.0–8.0)

## 2022-05-11 IMAGING — MG MM DIGITAL SCREENING BILAT W/ TOMO AND CAD
6 of 10 series · 6 of 30 positions shown · non-contrast
Comparison: Previous exam(s).

ACR Breast Density Category a: The breast tissue is almost entirely
fatty.

CLINICAL DATA: Screening.

EXAM:
DIGITAL SCREENING BILATERAL MAMMOGRAM WITH TOMOSYNTHESIS AND CAD
TECHNIQUE: Bilateral screening digital craniocaudal and mediolateral oblique
mammograms were obtained. Bilateral screening digital breast
tomosynthesis was performed. The images were evaluated with
computer-aided detection.

[R CC synth-2D (1 of 2)]
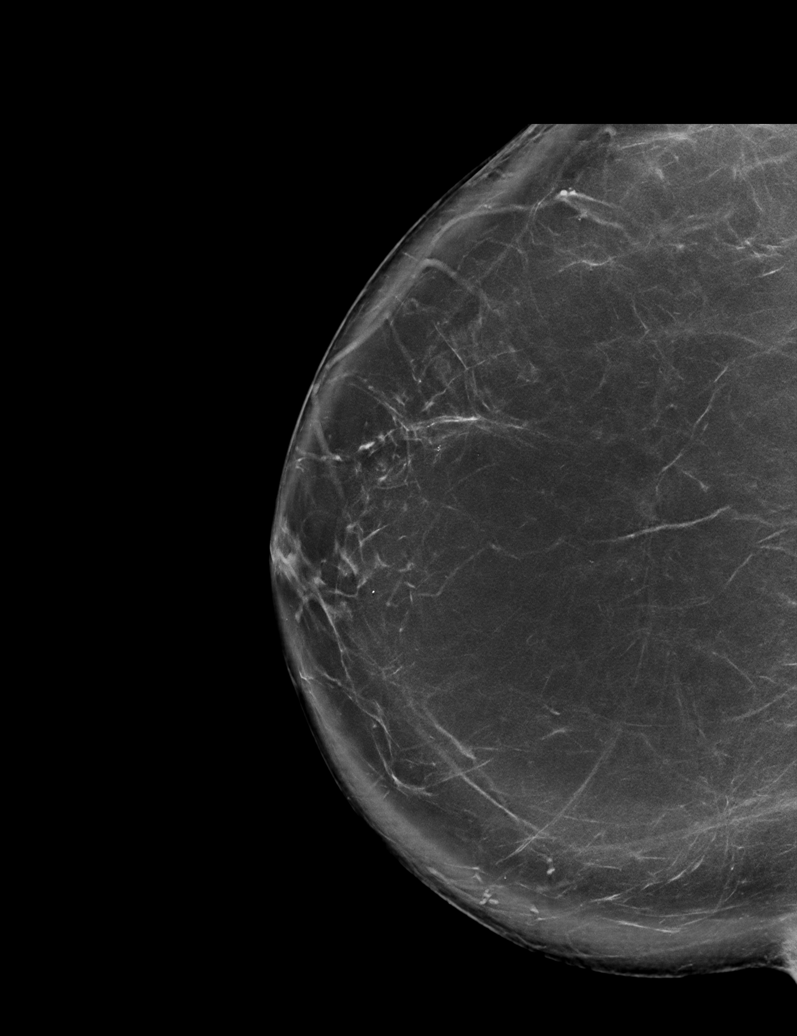

[L MLO synth-2D]
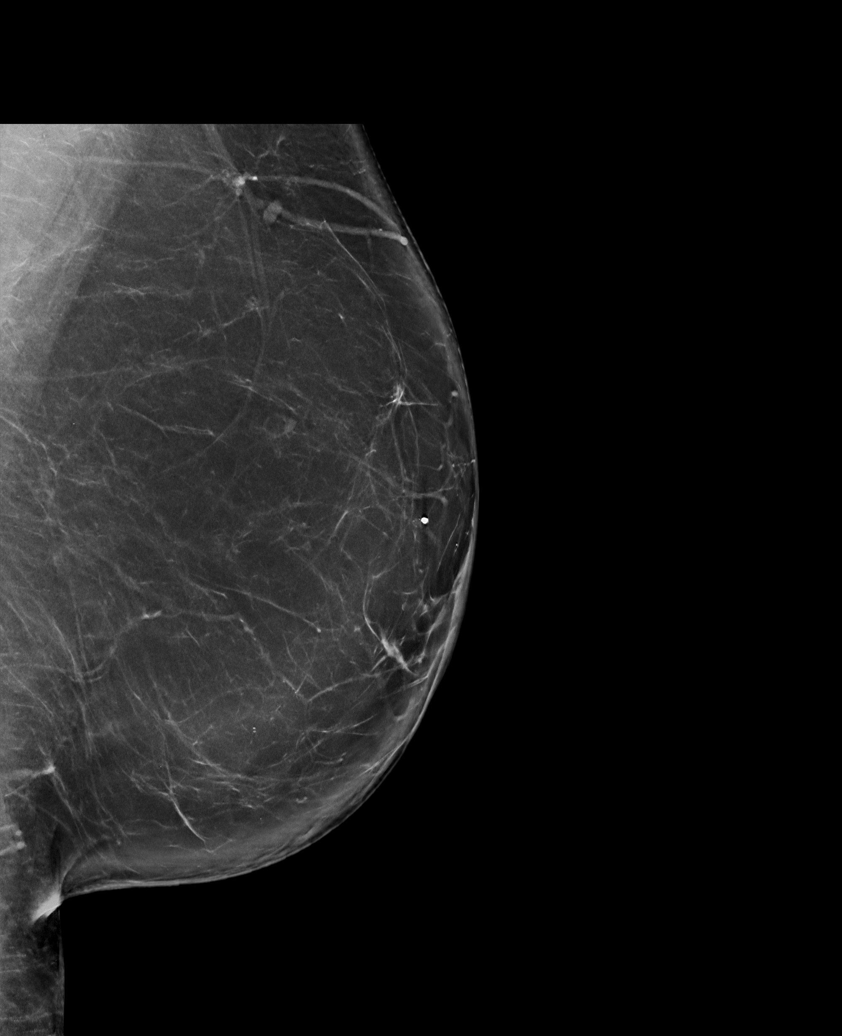

[R CC synth-2D (2 of 2)]
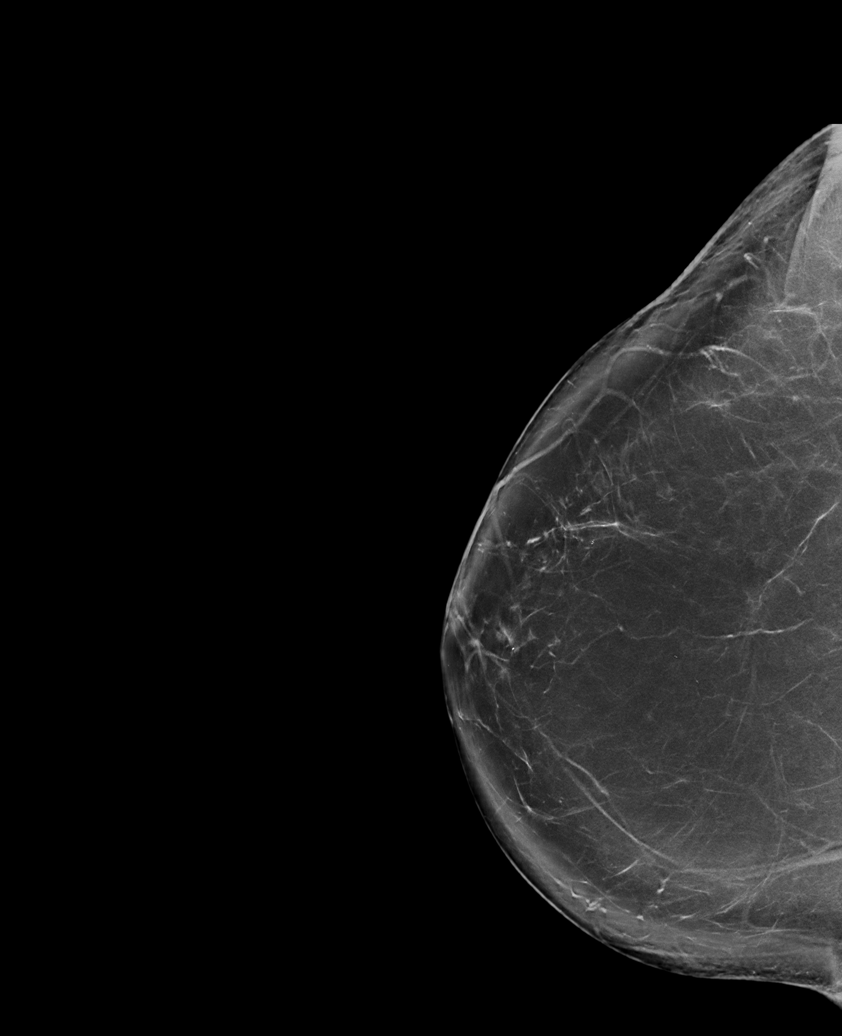

[L CC synth-2D]
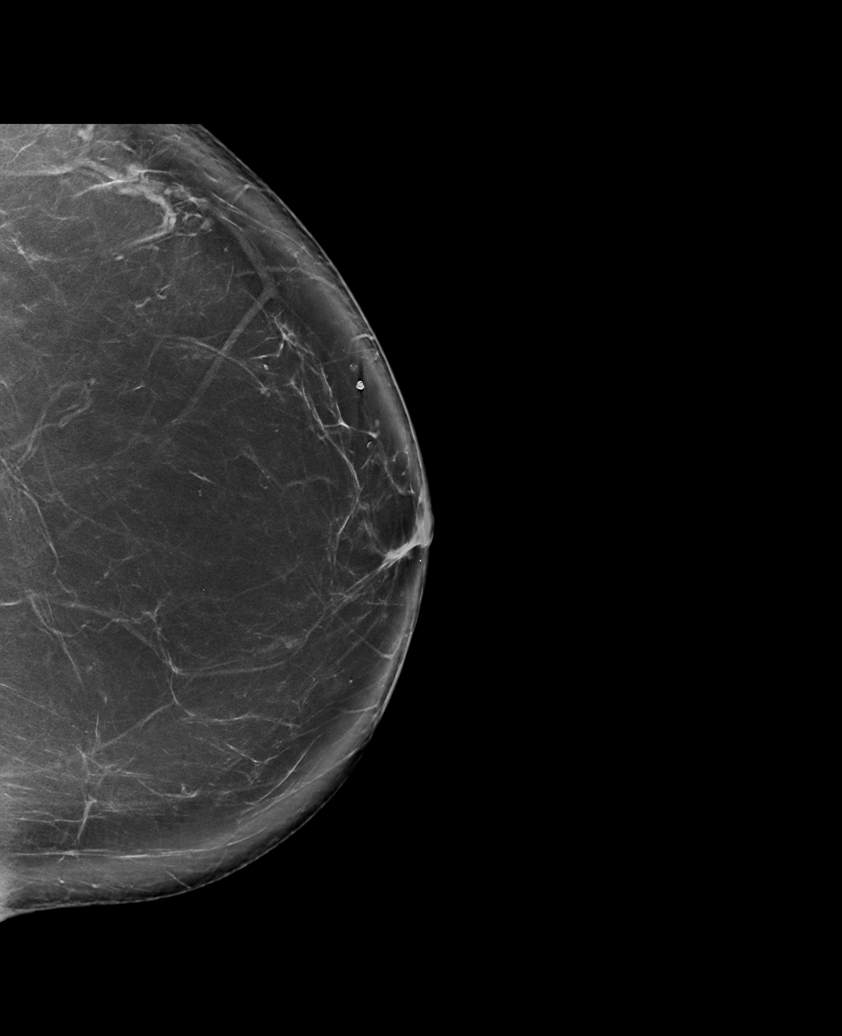

[R MLO synth-2D]
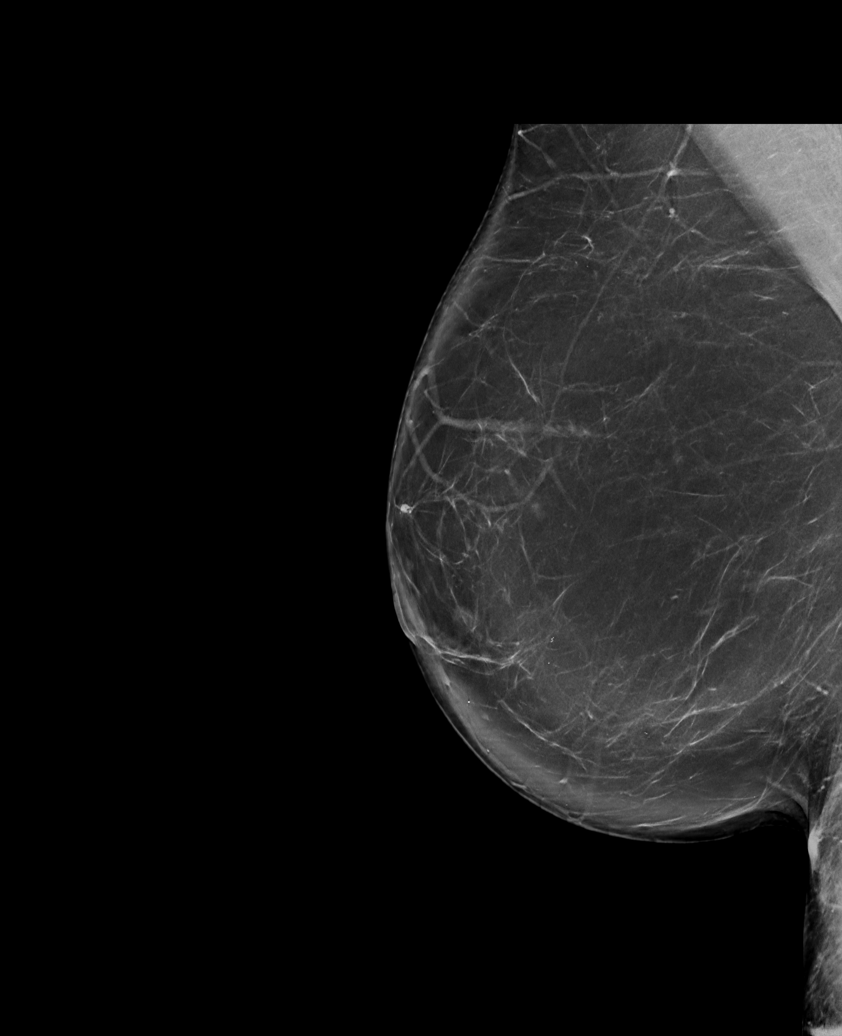

[R CC tomo · tomo slice 49/98.0]
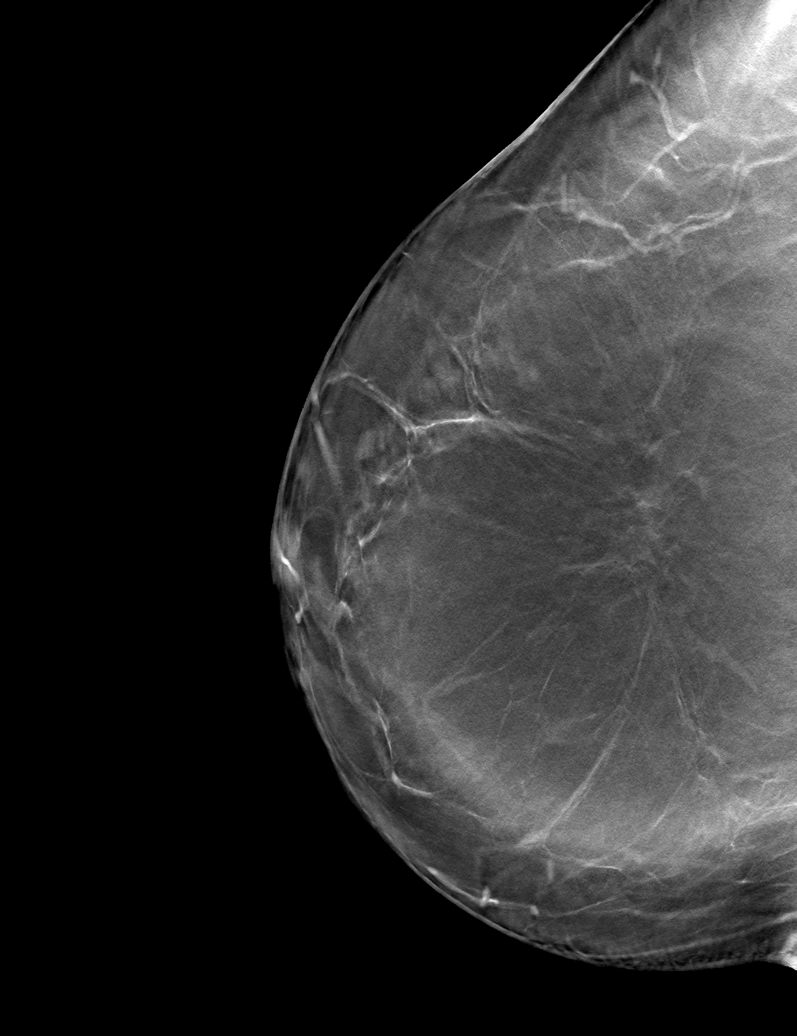

[6 of 30 positions shown; findings below may reference images not displayed]

FINDINGS: There are no findings suspicious for malignancy.
IMPRESSION: No mammographic evidence of malignancy. A result letter of this
screening mammogram will be mailed directly to the patient.

RECOMMENDATION:
Screening mammogram in one year. (Code:0E-3-N98)

BI-RADS CATEGORY  1: Negative.

## 2022-05-11 MED ORDER — FLUCONAZOLE 150 MG PO TABS
ORAL_TABLET | ORAL | 0 refills | Status: DC
  Filled 2022-05-11: qty 2, 3d supply, fill #0

## 2022-05-11 NOTE — Progress Notes (Signed)
? ?Established Patient Office Visit ? ?Subjective   ?Patient ID: Barbara Wood, female    DOB: 11-08-1970  Age: 52 y.o. MRN: [id number] ? ?Chief Complaint  ?Patient presents with  ? Urinary Tract Infection  ?  X1 week  ? Vaginitis  ? ? ?HPI ? ? ?Barbara Wood is seen with 1 week history of some burning with urination and vaginal irritation.  She was on vacation last week and had sexual intercourse several times.  She states the person she was with had not had intercourse with anyone in over a year and she is not concerned about STI issues.  She has had no fever.  No significant vaginal discharge.  She had some vaginal pruritus and had 1 leftover fluconazole and took that with mild improvement.  Wonders if she has incompletely treated yeast vaginitis.  She does have some pain around the introitus.  No external skin rashes.  No vesicles.  No bleeding. ? ?Past Medical History:  ?Diagnosis Date  ? Allergy Age 29  ? Asthma Since age 65  ? Depression Age 50  ? Hashimoto's disease   ? Heart murmur Mitral valve prolapse age 54  ? Migraine   ? Psoriasis   ? Thyroid disease Since age 89  ? ?Past Surgical History:  ?Procedure Laterality Date  ? BREAST SURGERY  Reduction age 71  ? CHOLECYSTECTOMY  Age 89  ? KNEE ARTHROSCOPY W/ LATERAL RELEASE Right   ? REDUCTION MAMMAPLASTY    ? WISDOM TOOTH EXTRACTION    ? ? reports that she has never smoked. She has never used smokeless tobacco. She reports current alcohol use. She reports that she does not use drugs. ?family history includes ADD / ADHD in her brother and daughter; Allergies in her brother; Ankylosing spondylitis in her father; Anxiety disorder in her sister; Arthritis in her sister; Asthma in her daughter and sister; Bipolar disorder in her mother; Cancer in her mother; Depression in her mother, sister, and sister; Diabetes in her mother; Eczema in her brother, daughter, and sister; Hashimoto's thyroiditis in her mother; Hypertension in her father; Migraines in her brother;  Miscarriages / [place] in her sister; Psoriasis in her brother, brother, father, maternal aunt, sister, sister, and sister; Rheum arthritis in her sister. ?Allergies  ?Allergen Reactions  ? Amoxicillin-Pot Clavulanate Nausea And Vomiting  ? Sertraline Other (See Comments)  ?  hallucinations   ? Sulfamethoxazole-Trimethoprim Hives  ? Triptans Other (See Comments)  ?  Chest pain ?  ? ? ?Review of Systems  ?Constitutional:  Negative for chills and fever.  ?Genitourinary:  Positive for dysuria. Negative for flank pain and hematuria.  ? ?  ?Objective:  ?  ? ?BP 100/60 (BP Location: Left Arm, Patient Position: Sitting, Cuff Size: Normal)   Pulse 75   Temp 98.1 ?F (36.7 ?C) (Oral)   Ht '5\' 7"'$  (1.702 m)   Wt 195 lb 11.2 oz (88.8 kg)   SpO2 98%   BMI 30.65 kg/m?  ? ? ?Physical Exam ?Vitals reviewed.  ?Genitourinary: ?   Comments: Pelvic exam reveals normal external genitalia.  She has some local irritation around the 6 o'clock position at the introitus.  No bleeding.  No vesicles.  No visible skin lesions.  We were planning to use speculum for further exam but patient was reluctant because of the discomfort.  No visible discharge at this time. ? ? ? ?Results for orders placed or performed in visit on 05/11/22  ?POCT Urinalysis Dipstick (Automated)  ?Result Value Ref Range  ?  Color, UA yelow   ? Clarity, UA clear   ? Glucose, UA Negative Negative  ? Bilirubin, UA negative   ? Ketones, UA negative   ? Spec Grav, UA 1.020 1.010 - 1.025  ? Blood, UA negative   ? pH, UA 6.0 5.0 - 8.0  ? Protein, UA Negative Negative  ? Urobilinogen, UA 0.2 0.2 or 1.0 E.U./dL  ? Nitrite, UA negative   ? Leukocytes, UA Negative Negative  ? ? ? ? ?The 10-year ASCVD risk score (Arnett DK, et al., 2019) is: 0.4% ? ?  ?Assessment & Plan:  ? ?Problem List Items Addressed This Visit   ?None ?Visit Diagnoses   ? ? Dysuria    -  Primary  ? Relevant Orders  ? POCT Urinalysis Dipstick (Automated) (Completed)  ? ?  ?Patient relates 1 week history of  vaginal irritation after intercourse.  Urine dipstick is normal.  She has had some intermittent itching and suspect some component of yeast vaginitis.   ? ?-Fluconazole 150 mg x 1 dose and repeat in 3 days if necessary ?-Avoid further intercourse for now and follow-up with primary in 2 weeks if times not fully resolved. ?-we discussed possible urine cytology to rule out STI and she feels risk very low and declines.  ? ?No follow-ups on file.  ? ? ?Carolann Littler, MD ? ?

## 2022-05-14 ENCOUNTER — Other Ambulatory Visit (HOSPITAL_COMMUNITY): Payer: Self-pay

## 2022-05-14 DIAGNOSIS — N390 Urinary tract infection, site not specified: Secondary | ICD-10-CM | POA: Diagnosis not present

## 2022-05-14 DIAGNOSIS — R8271 Bacteriuria: Secondary | ICD-10-CM | POA: Diagnosis not present

## 2022-05-14 DIAGNOSIS — R109 Unspecified abdominal pain: Secondary | ICD-10-CM | POA: Diagnosis not present

## 2022-05-14 MED ORDER — NITROFURANTOIN MONOHYD MACRO 100 MG PO CAPS
100.0000 mg | ORAL_CAPSULE | Freq: Two times a day (BID) | ORAL | 0 refills | Status: DC
  Filled 2022-05-14: qty 10, 5d supply, fill #0

## 2022-05-15 ENCOUNTER — Other Ambulatory Visit (HOSPITAL_COMMUNITY): Payer: Self-pay

## 2022-05-15 MED ORDER — CEPHALEXIN 500 MG PO CAPS
500.0000 mg | ORAL_CAPSULE | Freq: Four times a day (QID) | ORAL | 0 refills | Status: DC
  Filled 2022-05-15: qty 28, 7d supply, fill #0

## 2022-05-16 ENCOUNTER — Ambulatory Visit: Payer: 59 | Admitting: Psychiatry

## 2022-05-23 ENCOUNTER — Other Ambulatory Visit (HOSPITAL_COMMUNITY): Payer: Self-pay

## 2022-05-23 ENCOUNTER — Ambulatory Visit: Payer: 59 | Admitting: Internal Medicine

## 2022-05-23 ENCOUNTER — Encounter: Payer: Self-pay | Admitting: Internal Medicine

## 2022-05-23 VITALS — BP 108/66 | HR 79 | Temp 97.8°F | Ht 67.0 in | Wt 195.2 lb

## 2022-05-23 DIAGNOSIS — R7302 Impaired glucose tolerance (oral): Secondary | ICD-10-CM

## 2022-05-23 DIAGNOSIS — E063 Autoimmune thyroiditis: Secondary | ICD-10-CM | POA: Diagnosis not present

## 2022-05-23 DIAGNOSIS — G43909 Migraine, unspecified, not intractable, without status migrainosus: Secondary | ICD-10-CM | POA: Diagnosis not present

## 2022-05-23 DIAGNOSIS — M7918 Myalgia, other site: Secondary | ICD-10-CM | POA: Diagnosis not present

## 2022-05-23 DIAGNOSIS — F988 Other specified behavioral and emotional disorders with onset usually occurring in childhood and adolescence: Secondary | ICD-10-CM

## 2022-05-23 LAB — POCT GLYCOSYLATED HEMOGLOBIN (HGB A1C): Hemoglobin A1C: 5.6 % (ref 4.0–5.6)

## 2022-05-23 LAB — TSH: TSH: 0.05 u[IU]/mL — ABNORMAL LOW (ref 0.35–5.50)

## 2022-05-23 LAB — HEMOGLOBIN A1C: Hgb A1c MFr Bld: 5.7 % (ref 4.6–6.5)

## 2022-05-23 MED ORDER — METAXALONE 800 MG PO TABS
800.0000 mg | ORAL_TABLET | Freq: Three times a day (TID) | ORAL | 1 refills | Status: DC
  Filled 2022-05-23: qty 90, 30d supply, fill #0
  Filled 2023-05-02: qty 90, 30d supply, fill #1

## 2022-05-23 MED ORDER — HYDROCODONE-ACETAMINOPHEN 5-325 MG PO TABS
1.0000 | ORAL_TABLET | Freq: Four times a day (QID) | ORAL | 0 refills | Status: DC | PRN
  Filled 2022-05-23: qty 30, 8d supply, fill #0

## 2022-05-23 MED ORDER — LISDEXAMFETAMINE DIMESYLATE 50 MG PO CAPS: 50.0000 mg | ORAL_CAPSULE | Freq: Every day | ORAL | 0 refills | Status: DC

## 2022-05-23 MED ORDER — TRAMADOL HCL 50 MG PO TABS
100.0000 mg | ORAL_TABLET | Freq: Two times a day (BID) | ORAL | 0 refills | Status: DC
  Filled 2022-05-23: qty 360, 90d supply, fill #0

## 2022-05-23 MED ORDER — LISDEXAMFETAMINE DIMESYLATE 50 MG PO CAPS
50.0000 mg | ORAL_CAPSULE | Freq: Every day | ORAL | 0 refills | Status: DC
  Filled 2022-05-23 – 2022-05-31 (×2): qty 30, 30d supply, fill #0

## 2022-05-23 MED ORDER — LISDEXAMFETAMINE DIMESYLATE 50 MG PO CAPS
50.0000 mg | ORAL_CAPSULE | Freq: Every day | ORAL | 0 refills | Status: DC
  Filled 2022-05-23: qty 30, 30d supply, fill #0

## 2022-05-23 NOTE — Progress Notes (Signed)
Established Patient Office Visit     CC/Reason for Visit: Discuss chronic conditions, medication refills  HPI: Barbara Wood is a 52 y.o. female who is coming in today for the above mentioned reasons. Past Medical History is significant for: ADHD on stable dose of Vyvanse for approximately 9 years, she has a very significant history of migraines that is relatively well controlled on Emgality and [name].  She has myofascial pain and cervical disc disease gets OMT by Dr. Gardenia Phlegm she takes tramadol daily and has a very sporadic prescription for Norco for migraine relief when the [name] and Emgality do not take care of it.  She also has a history of impaired glucose tolerance.  She also has hypothyroidism.  She is doing well today without any major complaints.  At last visit she was found to have an over suppressed TSH and is due to have it checked after dose reduction.  She is requesting refills of her controlled substances.  She would like a refill for Skelaxin.  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   Past Medical/Surgical History: Past Medical History:  Diagnosis Date   Allergy Age 44   Asthma Since age 25   Depression Age 89   Hashimoto's disease    Heart murmur Mitral valve prolapse age 45   Migraine    Psoriasis    Thyroid disease Since age 89    Past Surgical History:  Procedure Laterality Date   BREAST SURGERY  Reduction age 77   CHOLECYSTECTOMY  Age 40   KNEE ARTHROSCOPY W/ LATERAL RELEASE Right    REDUCTION MAMMAPLASTY     WISDOM TOOTH EXTRACTION      Social History:  reports that she has never smoked. She has never used smokeless tobacco. She reports current alcohol use. She reports that she does not use drugs.  Allergies: Allergies  Allergen Reactions   Amoxicillin-Pot Clavulanate Nausea And Vomiting    Sertraline Other (See Comments)    hallucinations    Sulfamethoxazole-Trimethoprim Hives   Triptans Other (See Comments)    Chest pain     Family History:  Family History  Problem Relation Age of Onset   Asthma Sister    Miscarriages / [place] Sister    Depression Sister    Psoriasis Sister    Cancer Mother    Depression Mother    Hashimoto's thyroiditis Mother    Diabetes Mother    Bipolar disorder Mother    Hypertension Father    Ankylosing spondylitis Father    Psoriasis Father    Psoriasis Brother    Migraines Brother    ADD / ADHD Brother    Eczema Brother    Allergies Brother    Psoriasis Maternal Aunt    Psoriasis Sister    Arthritis Sister        psoriatic arthritis    Psoriasis Sister    Rheum arthritis Sister    Depression Sister    Anxiety disorder Sister    Eczema Sister    Psoriasis Brother    ADD / ADHD Daughter    Eczema Daughter    Asthma Daughter      Current Outpatient Medications:    albuterol (PROAIR HFA) 108 (90 Base) MCG/ACT inhaler, Inhale 2 puffs into the lungs every 6 (six) hours as needed for  wheezing or shortness of breath., Disp: 18 g, Rfl: 0   ARIPiprazole (ABILIFY) 10 MG tablet, Take 1 tablet (10 mg total) by mouth daily., Disp: 90 tablet, Rfl: 3   atenolol (TENORMIN) 25 MG tablet, Take 1 tablet (25 mg total) by mouth 2 (two) times daily. (Patient taking differently: Take 25 mg by mouth daily.), Disp: 180 tablet, Rfl: 3   betamethasone dipropionate 0.05 % cream, Apply topically 2 (two) times daily., Disp: 45 g, Rfl: 5   betamethasone dipropionate 0.05 % lotion, Apply 1 application topically to the scalp daily. Taper use as able., Disp: 120 mL, Rfl: 3   budesonide-formoterol (SYMBICORT) 160-4.5 MCG/ACT inhaler, as needed., Disp: , Rfl:    cetirizine (ZYRTEC) 10 MG tablet, Take 1 tablet by mouth 2 (two) times a day., Disp: , Rfl:    Cholecalciferol (VITAMIN D3) 250 MCG (10000 UT) capsule, Take 10,000 Units by mouth daily., Disp: ,  Rfl:    COVID-19 At Home Antigen Test (CARESTART COVID-19 HOME TEST) KIT, use as directed, Disp: 8 each, Rfl: 0   diclofenac (VOLTAREN) 25 MG EC tablet, Take 75 mg by mouth 2 (two) times daily., Disp: , Rfl:    Fluocinolone Acetonide Scalp (DERMA-SMOOTHE/FS SCALP) 0.01 % OIL, Apply TID, Disp: 118.28 mL, Rfl: 5   Galcanezumab-gnlm (EMGALITY) 120 MG/ML SOAJ, Inject 120 mg into the skin every 30 (thirty) days., Disp: 3 mL, Rfl: 3   levothyroxine (SYNTHROID) 112 MCG tablet, Take 1 tablet (112 mcg total) by mouth daily., Disp: 90 tablet, Rfl: 1   lidocaine (LIDODERM) 5 %, Place 1 patch onto the skin daily. Remove & Discard patch within 12 hours or as directed by MD, Disp: 30 patch, Rfl: 2   liothyronine (CYTOMEL) 5 MCG tablet, Take 1 tablet (5 mcg total) by mouth in the morning, at noon, and at bedtime., Disp: 270 tablet, Rfl: 3   montelukast (SINGULAIR) 10 MG tablet, Take 1 tablet (10 mg total) by mouth at bedtime., Disp: 90 tablet, Rfl: 3   omeprazole (PRILOSEC) 20 MG capsule, Take 1 capsule (20 mg total) by mouth daily., Disp: 30 capsule, Rfl: 3   promethazine (PHENERGAN) 25 MG tablet, Take 1 tablet (25 mg total) by mouth every 6 (six) hours as needed for nausea or vomiting., Disp: 30 tablet, Rfl: 3   Rimegepant Sulfate ([name]) 75 MG TBDP, Take 1 tablet by mouth daily as needed., Disp: 30 tablet, Rfl: 2   traZODone (DESYREL) 50 MG tablet, Take 1-2 tablets (50-100 mg total) by mouth at bedtime as needed for sleep., Disp: 60 tablet, Rfl: 1   HYDROcodone-acetaminophen (NORCO) 5-325 MG tablet, Take 1 tablet by mouth every 6 (six) hours as needed for moderate pain., Disp: 30 tablet, Rfl: 0   lisdexamfetamine (VYVANSE) 50 MG capsule, Take 1 capsule (50 mg total) by mouth daily., Disp: 30 capsule, Rfl: 0   [START ON 06/01/2022] lisdexamfetamine (VYVANSE) 50 MG capsule, Take 1 capsule (50 mg total) by mouth daily. (may fill 06/01/22), Disp: 30 capsule, Rfl: 0   lisdexamfetamine (VYVANSE) 50 MG capsule, Take 1  capsule (50 mg total) by mouth daily., Disp: 30 capsule, Rfl: 0   metaxalone (SKELAXIN) 800 MG tablet, Take 1 tablet (800 mg total) by mouth 3 (three) times daily., Disp: 90 tablet, Rfl: 1   traMADol (ULTRAM) 50 MG tablet, Take 2 tablets (100 mg total) by mouth 2 (two) times daily., Disp: 360 tablet, Rfl: 0  Review of Systems:  Constitutional: Denies fever, chills, diaphoresis, appetite change and fatigue.  HEENT:  Denies photophobia, eye pain, redness, hearing loss, ear pain, congestion, sore throat, rhinorrhea, sneezing, mouth sores, trouble swallowing, neck pain, neck stiffness and tinnitus.   Respiratory: Denies SOB, DOE, cough, chest tightness,  and wheezing.   Cardiovascular: Denies chest pain, palpitations and leg swelling.  Gastrointestinal: Denies nausea, vomiting, abdominal pain, diarrhea, constipation, blood in stool and abdominal distention.  Genitourinary: Denies dysuria, urgency, frequency, hematuria, flank pain and difficulty urinating.  Endocrine: Denies: hot or cold intolerance, sweats, changes in hair or nails, polyuria, polydipsia. Musculoskeletal: Denies myalgias, back pain, joint swelling, arthralgias and gait problem.  Skin: Denies pallor, rash and wound.  Neurological: Denies dizziness, seizures, syncope, weakness, light-headedness, numbness and headaches.  Hematological: Denies adenopathy. Easy bruising, personal or family bleeding history  Psychiatric/Behavioral: Denies suicidal ideation, mood changes, confusion, nervousness, sleep disturbance and agitation    Physical Exam: Vitals:   05/23/22 1405  BP: 108/66  Pulse: 79  Temp: 97.8 F (36.6 C)  TempSrc: Oral  SpO2: 97%  Weight: 195 lb 3.2 oz (88.5 kg)  Height: 5' 7" (1.702 m)    Body mass index is 30.57 kg/m.   Constitutional: NAD, calm, comfortable Eyes: PERRL, lids and conjunctivae normal ENMT: Mucous membranes are moist. Psychiatric: Normal judgment and insight. Alert and oriented x 3. Normal mood.     Impression and Plan:  IGT (impaired glucose tolerance)  - Plan: POC HgB A1c, Hemoglobin A1c  Myofascial pain - Plan: traMADol (ULTRAM) 50 MG tablet, metaxalone (SKELAXIN) 800 MG tablet  Migraine without status migrainosus, not intractable, unspecified migraine type - Plan: traMADol (ULTRAM) 50 MG tablet, HYDROcodone-acetaminophen (NORCO) 5-325 MG tablet  Attention deficit disorder, unspecified hyperactivity presence - Plan: lisdexamfetamine (VYVANSE) 50 MG capsule, lisdexamfetamine (VYVANSE) 50 MG capsule, lisdexamfetamine (VYVANSE) 50 MG capsule  Hashimoto's disease  - Plan: TSH  -PDMP reviewed, no red flags, overdose risk score is 190. -Vyvanse 50 mg will be refilled, also tramadol and Skelaxin.  She will get 30 tablets of hydrocodone which she takes for her migraines only when Emgality and [name] are not effective.  Time spent:33 minutes reviewing chart, interviewing and examining patient and formulating plan of care.      Lelon Frohlich, MD [facility]

## 2022-05-24 ENCOUNTER — Other Ambulatory Visit: Payer: Self-pay | Admitting: Internal Medicine

## 2022-05-24 ENCOUNTER — Other Ambulatory Visit: Payer: Self-pay | Admitting: *Deleted

## 2022-05-24 ENCOUNTER — Other Ambulatory Visit (HOSPITAL_COMMUNITY): Payer: Self-pay

## 2022-05-24 DIAGNOSIS — E063 Autoimmune thyroiditis: Secondary | ICD-10-CM

## 2022-05-24 MED ORDER — LEVOTHYROXINE SODIUM 100 MCG PO TABS
100.0000 ug | ORAL_TABLET | Freq: Every day | ORAL | 1 refills | Status: DC
  Filled 2022-05-24: qty 90, 90d supply, fill #0
  Filled 2022-08-21: qty 90, 90d supply, fill #1

## 2022-05-31 ENCOUNTER — Other Ambulatory Visit (HOSPITAL_COMMUNITY): Payer: Self-pay

## 2022-06-01 ENCOUNTER — Encounter: Payer: Self-pay | Admitting: Internal Medicine

## 2022-06-01 ENCOUNTER — Encounter: Payer: Self-pay | Admitting: Podiatry

## 2022-06-01 ENCOUNTER — Other Ambulatory Visit (HOSPITAL_COMMUNITY): Payer: Self-pay

## 2022-06-01 DIAGNOSIS — G43909 Migraine, unspecified, not intractable, without status migrainosus: Secondary | ICD-10-CM

## 2022-06-06 ENCOUNTER — Ambulatory Visit (INDEPENDENT_AMBULATORY_CARE_PROVIDER_SITE_OTHER): Payer: 59 | Admitting: Psychiatry

## 2022-06-06 ENCOUNTER — Other Ambulatory Visit (HOSPITAL_COMMUNITY): Payer: Self-pay

## 2022-06-06 ENCOUNTER — Other Ambulatory Visit: Payer: Self-pay | Admitting: Internal Medicine

## 2022-06-06 DIAGNOSIS — F4310 Post-traumatic stress disorder, unspecified: Secondary | ICD-10-CM | POA: Diagnosis not present

## 2022-06-06 MED ORDER — ALBUTEROL SULFATE HFA 108 (90 BASE) MCG/ACT IN AERS
2.0000 | INHALATION_SPRAY | Freq: Four times a day (QID) | RESPIRATORY_TRACT | 2 refills | Status: DC | PRN
Start: 2022-06-06 — End: 2023-10-14
  Filled 2022-06-06: qty 18, 25d supply, fill #0
  Filled 2022-11-26 – 2023-02-22 (×2): qty 18, 25d supply, fill #1

## 2022-06-06 NOTE — Progress Notes (Signed)
      [facility] Counselor/Therapist Progress Note  Patient ID: Barbara Wood, MRN: [id number],    Date: 06/06/2022  Time Spent: 50 minutes start time 2:07 PM end time 2:57 PM  Treatment Type: Individual Therapy  Reported Symptoms: anxiety, sadness  Mental Status Exam:  Appearance:   Well Groomed     Behavior:  Appropriate  Motor:  Normal  Speech/Language:   Normal Rate  Affect:  Appropriate  Mood:  anxious  Thought process:  normal  Thought content:    WNL  Sensory/Perceptual disturbances:    WNL  Orientation:  oriented to person, place, time/date, and situation  Attention:  Good  Concentration:  Good  Memory:  WNL  Fund of knowledge:   Good  Insight:    Good  Judgment:   Good  Impulse Control:  Good   Risk Assessment: Danger to Self:  No Self-injurious Behavior: No Danger to Others: No Duty to Warn:no Physical Aggression / Violence:No  Access to Firearms a concern: No  Gang Involvement:No   Subjective: Patient was present for session.  Patient reported that she is struggling with some things and does not feel that she can process information as typically currently.  She shared she just needed an opportunity to release some of the things that are on her mind and discussed ways to self-care at the end of session.  Patient discussed the situation with her husband and how she had gotten triggered earlier in the day by his poor parenting issues.  Discussed how she would handle that with her daughter and her ex and the best way possible.  Patient also shared that mediation was completed but she is recognizing she will continue to have to deal with issues regarding her ex.  Discussed different ways that she could handle things so that she does not take things personal that she needs to release.  Patient also shared some dynamics with her current relationship that have been triggering for her.  Patient was encouraged to focus on the things that she can control fix and change.  She  was encouraged to use grounding exercises as needed and to work on taking care of herself to manage emotions appropriately.  Interventions: Cognitive Behavioral Therapy and Solution-Oriented/Positive Psychology  Diagnosis:   ICD-10-CM   1. PTSD (post-traumatic stress disorder)  F43.10       Plan: Patient is to use coping skills to decrease triggered responses.  Patient is to look up brain spotting bilateral music.  Patient is to allow processing to continue between sessions .  Patient is to continue working on limit setting with daughter and ex-husband.  Patient is to release negative emotions through exercise  Long-term goal: Develop healthy boundaries Short-term goal: Resolve the trauma from past Give her daughter more stability Trying to navigate divorce and have good boundaries Deal with childhood trauma Figure out relationship with [name], [facility]

## 2022-06-06 NOTE — Progress Notes (Signed)
Barbara Wood Phone: [phone] Subjective:   Barbara Wood, am serving as a scribe for Dr. Hulan Saas.  I'm seeing this patient by the request  of:  Barbara Wood, Barbara Halsted, MD  CC: Neck and back pain follow-up  PYK:[id number]  Barbara Wood is a 52 y.o. female coming in with complaint of back and neck pain. OMT on 04/25/2022.  Patient has had some increasing and low back pain.  Patient states tenderness mild ache but nothing severe.  Unfortunately over the course of time has had some feels like sometimes her legs with standing or wants to give out.  Patient at this moment  Medications patient has been prescribed: None  Taking: Reviewed chart and noted to have recent work-up for UTI that patient feels is more secondary to urinary retention Also has had numbness in the left foot        Reviewed prior external information including notes and imaging from previsou exam, outside providers and external EMR if available.   As well as notes that were available from care everywhere and other healthcare systems.  Past medical history, social, surgical and family history all reviewed in electronic medical record.  No pertanent information unless stated regarding to the chief complaint.   Past Medical History:  Diagnosis Date   Allergy Age 54   Asthma Since age 6   Depression Age 40   Hashimoto's disease    Heart murmur Mitral valve prolapse age 38   Migraine    Psoriasis    Thyroid disease Since age 87    Allergies  Allergen Reactions   Amoxicillin-Pot Clavulanate Nausea And Vomiting   Sertraline Other (See Comments)    hallucinations    Sulfamethoxazole-Trimethoprim Hives   Triptans Other (See Comments)    Chest pain      Review of Systems:  No headache, visual changes, nausea, vomiting, diarrhea, constipation, dizziness, abdominal pain, skin rash, fevers, chills, night sweats, weight loss, swollen  lymph nodes, body aches, joint swelling, chest pain, shortness of breath, mood changes. POSITIVE muscle aches, body aches, urinary retention  Objective  Blood pressure 110/72, pulse 94, height '5\' 7"'$  (1.702 m), weight 194 lb (88 kg), SpO2 97 %.   General: No apparent distress alert and oriented x3 mood and affect normal, dressed appropriately.  HEENT: Pupils equal, extraocular movements intact  Respiratory: Patient's speak in full sentences and does not appear short of breath  Cardiovascular: No lower extremity edema, non tender, no erythema  Neck exam does still have some mild tightness especially with sidebending bilaterally patient does lack corresponding extension.  Tightness noted in the parascapular region.  Low back exam does have some loss of lordosis.  Patient does have tightness noted.  Straight leg test bilaterally.  Patient does have 3+ DTR's.  Potential left-sided beat of clonus  Osteopathic findings  C2 flexed rotated and side bent right C6 flexed rotated and side bent left T3 extended rotated and side bent right inhaled rib     Assessment and Plan:  Lumbar radiculopathy, acute Patient is having acute difficulty.  With reviewing the chart patient was having some signs and symptoms consistent initially with a possible urinary infection I think it is more patient does have 3+ DTRs noted on exam today.  Some worsening pain with patient does have CT scan of the abdomen and pelvis in 2022 as well as x-rays of the pelvis and lumbar spine outside facility.  Concern  for potential cauda equina symptoms or some type of potential cyst formation.  Held on any type of osteopathic manipulation today regarding the back secondary to the discomfort.  We did do some mild on the neck.  The patient responded well to that.  We discussed with patient having the symptoms that I do feel MRI is necessary at this time.  This patient could be a candidate for potential injection.  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 if worsening symptoms, true urinary stream needs to seek medical attention patient is in agreement with the plan follow-up with [place] after MRI    Nonallopathic problems  Decision today to treat with OMT was based on Physical Exam  After verbal consent patient was treated with HVLA, ME, FPR techniques in cervical, rib, thoracic, l areas  Patient tolerated the procedure well with improvement in symptoms  Patient given exercises, stretches and lifestyle modifications  See medications in patient instructions if given  Patient will follow up in 4-8 weeks      The above documentation has been reviewed and is accurate and complete Lyndal Pulley, DO        Note: This dictation was prepared with Dragon dictation along with smaller phrase technology. Any transcriptional errors that result from this process are unintentional.

## 2022-06-07 ENCOUNTER — Ambulatory Visit: Payer: 59 | Admitting: Family Medicine

## 2022-06-07 DIAGNOSIS — M5416 Radiculopathy, lumbar region: Secondary | ICD-10-CM | POA: Diagnosis not present

## 2022-06-07 NOTE — Patient Instructions (Signed)
See you again in 6-8 weeks

## 2022-06-07 NOTE — Assessment & Plan Note (Signed)
Patient is having acute difficulty.  With reviewing the chart patient was having some signs and symptoms consistent initially with a possible urinary infection I think it is more patient does have 3+ DTRs noted on exam today.  Some worsening pain with patient does have CT scan of the abdomen and pelvis in 2022 as well as x-rays of the pelvis and lumbar spine outside facility.  Concern for potential cauda equina symptoms or some type of potential cyst formation.  Held on any type of osteopathic manipulation today regarding the back secondary to the discomfort.  We did do some mild on the neck.  The patient responded well to that.  We discussed with patient having the symptoms that I do feel MRI is necessary at this time.  This patient could be a candidate for potential injection.  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

## 2022-06-13 ENCOUNTER — Telehealth: Payer: Self-pay | Admitting: Internal Medicine

## 2022-06-13 DIAGNOSIS — N898 Other specified noninflammatory disorders of vagina: Secondary | ICD-10-CM

## 2022-06-13 NOTE — Telephone Encounter (Signed)
--  Caller states having post menopausal vaginal bleeding; would like endometrial biopsy; last period 09/2020, bleeding like started a period, no pain or discomfort.  06/13/2022 10:06:57 AM See PCP within 2 Garey Ham, RN, Cristan  Comments User: Luis Abed, RN Date/Time Eilene Ghazi Time): 06/13/2022 9:59:34 AM started bleeding last night after sex  User: Luis Abed, RN Date/Time Eilene Ghazi Time): 06/13/2022 10:07:27 AM caller transferred to office line for appointment.

## 2022-06-13 NOTE — Telephone Encounter (Signed)
Pt requesting a referral to ob gyn

## 2022-06-20 ENCOUNTER — Ambulatory Visit (INDEPENDENT_AMBULATORY_CARE_PROVIDER_SITE_OTHER): Payer: 59 | Admitting: Psychiatry

## 2022-06-20 DIAGNOSIS — F4310 Post-traumatic stress disorder, unspecified: Secondary | ICD-10-CM

## 2022-06-20 NOTE — Progress Notes (Signed)
      [facility] Counselor/Therapist Progress Note  Patient ID: Barbara Wood, MRN: [id number],    Date: 06/20/2022  Time Spent: 49 minutes start time 2:04 PM end time 2:53 PM  Treatment Type: Individual Therapy  Reported Symptoms: anxiety, sadness, triggered responses  Mental Status Exam:  Appearance:   Well Groomed     Behavior:  Appropriate  Motor:  Normal  Speech/Language:   Normal Rate  Affect:  Appropriate  Mood:  normal  Thought process:  normal  Thought content:    WNL  Sensory/Perceptual disturbances:    WNL  Orientation:  oriented to person, place, time/date, and situation  Attention:  Good  Concentration:  Good  Memory:  WNL  Fund of knowledge:   Good  Insight:    Good  Judgment:   Good  Impulse Control:  Good   Risk Assessment: Danger to Self:  No Self-injurious Behavior: No Danger to Others: No Duty to Warn:no Physical Aggression / Violence:No  Access to Firearms a concern: No  Gang Involvement:No   Subjective: Patient was present for session. She shared that things were still difficult with her boyfriend.  Patient shared some of the things going on and how she is handling the different situations.  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  Patient was able to reduce suds level to 4.  She was able to develop a plan that she felt would accomplish what she has to.  Patient stated she does not feel things will move any further until she starts working on her plan.  Discussed ways to talk herself through that.  Interventions: Eye Movement Desensitization and Reprocessing (EMDR) and Insight-Oriented  Diagnosis:   ICD-10-CM   1. PTSD (post-traumatic stress disorder)  F43.10       Plan:  Patient is to use coping skills to decrease triggered responses.  Patient is to l follow plans from session  to accomplish what she has to for her board certification.  Patient is to allow processing to continue between sessions .  Patient is to continue working on limit setting with daughter and ex-husband.  Patient is to release negative emotions through exercise  Long-term goal: Develop healthy boundaries Short-term goal: Resolve the trauma from past Give her daughter more stability Trying to navigate divorce and have good boundaries Deal with childhood trauma Figure out relationship with Barbara Wood, [facility]

## 2022-06-21 ENCOUNTER — Ambulatory Visit: Payer: 59 | Admitting: Radiology

## 2022-06-21 ENCOUNTER — Encounter: Payer: Self-pay | Admitting: Radiology

## 2022-06-21 ENCOUNTER — Other Ambulatory Visit (HOSPITAL_COMMUNITY): Payer: Self-pay

## 2022-06-21 VITALS — BP 118/76 | Ht 67.0 in | Wt 197.0 lb

## 2022-06-21 DIAGNOSIS — N912 Amenorrhea, unspecified: Secondary | ICD-10-CM

## 2022-06-21 DIAGNOSIS — N950 Postmenopausal bleeding: Secondary | ICD-10-CM | POA: Diagnosis not present

## 2022-06-21 DIAGNOSIS — Z113 Encounter for screening for infections with a predominantly sexual mode of transmission: Secondary | ICD-10-CM

## 2022-06-21 NOTE — Addendum Note (Signed)
Addended by: Rubbie Battiest on: 06/21/2022 03:49 PM   Modules accepted: Orders

## 2022-06-21 NOTE — Progress Notes (Signed)
   Barbara Wood 1970/10/16 [id number]   History:  52 y.o. G1P1 presents for annual exam with reports of bleeding after intercourse x 3 episodes. Began red and then was brown, heavy enough to use a tampon. Denies any associated pain. She has been sexually active with this partner x 9 months. LNMP 2021.   Gynecologic History   Contraception/Family planning: post menopausal status Sexually active: yes   Obstetric History OB History  Gravida Para Term Preterm AB Living  '1 1       1  '$ SAB IAB Ectopic Multiple Live Births          1    # Outcome Date GA Lbr Len/2nd Weight Sex Delivery Anes PTL Lv  1 Para              The following portions of the patient's history were reviewed and updated as appropriate: allergies, current medications, past family history, past medical history, past social history, past surgical history, and problem list.  Review of Systems Pertinent items noted in HPI and remainder of comprehensive ROS otherwise negative.   Past medical history, past surgical history, family history and social history were all reviewed and documented in the EPIC chart.   Exam:  Vitals:   06/21/22 1454  BP: 118/76  Weight: 197 lb (89.4 kg)  Height: '5\' 7"'$  (1.702 m)   Body mass index is 30.85 kg/m.  General appearance:  Normal Respiratory  Normal rate Cardiovascular  Edema/varicosities:  Not grossly evident  Genitourinary   Inguinal/mons:  Normal without inguinal adenopathy  External genitalia:  Normal appearing vulva with no masses, tenderness, or lesions  BUS/Urethra/Skene's glands:  Normal without masses or exudate  Vagina:  Normal appearing with normal color and discharge, no lesions  Cervix:  Normal appearing without discharge or lesions  Uterus:  Normal in size, shape and contour.  Mobile, nontender  Adnexa/parametria:     Rt: Normal in size, without masses or tenderness.   Lt: Normal in size, without masses or tenderness.  Anus and perineum: Normal   Patient  informed chaperone available to be present for breast and pelvic exam. Patient has requested no chaperone to be present. Patient has been advised what will be completed during breast and pelvic exam.   Assessment/Plan:     1. Post-menopausal bleeding  - US Transvaginal Non-OB; Future - SURESWAB CT/NG/T. vaginalis  2. Amenorrhea To confirm menopausal status - FSH  3. Screening examination for venereal disease Accepts aptima testing     Barbara Wood B WHNP-BC 3:18 PM 06/21/2022

## 2022-06-22 LAB — SURESWAB CT/NG/T. VAGINALIS
C. trachomatis RNA, TMA: NOT DETECTED
N. gonorrhoeae RNA, TMA: NOT DETECTED
Trichomonas vaginalis RNA: NOT DETECTED

## 2022-06-25 ENCOUNTER — Telehealth: Payer: Self-pay | Admitting: *Deleted

## 2022-06-25 NOTE — Telephone Encounter (Signed)
Patient called stating [facility] didn't receive the [facility] order in epic. Patient asked I fax the order to [facility] at [phone] order confirmation received.

## 2022-06-27 ENCOUNTER — Other Ambulatory Visit: Payer: Self-pay | Admitting: Family Medicine

## 2022-06-27 ENCOUNTER — Other Ambulatory Visit: Payer: Self-pay | Admitting: Internal Medicine

## 2022-06-27 ENCOUNTER — Other Ambulatory Visit (HOSPITAL_COMMUNITY): Payer: Self-pay

## 2022-06-27 ENCOUNTER — Ambulatory Visit: Payer: 59 | Admitting: Podiatry

## 2022-06-27 DIAGNOSIS — F988 Other specified behavioral and emotional disorders with onset usually occurring in childhood and adolescence: Secondary | ICD-10-CM

## 2022-06-27 DIAGNOSIS — E063 Autoimmune thyroiditis: Secondary | ICD-10-CM

## 2022-06-27 DIAGNOSIS — G4700 Insomnia, unspecified: Secondary | ICD-10-CM

## 2022-06-27 MED ORDER — TRAZODONE HCL 50 MG PO TABS
50.0000 mg | ORAL_TABLET | Freq: Every evening | ORAL | 1 refills | Status: DC | PRN
  Filled 2022-06-27: qty 60, 30d supply, fill #0
  Filled 2022-08-21: qty 60, 30d supply, fill #1

## 2022-06-27 NOTE — Telephone Encounter (Signed)
Patient has an appointment scheduled for 08/29/22

## 2022-06-27 NOTE — Telephone Encounter (Signed)
Requesting a refill of lisdexamfetamine (VYVANSE) 50 MG capsule   [facility] Phone:  [phone]  Fax:  [phone]

## 2022-06-28 ENCOUNTER — Other Ambulatory Visit (HOSPITAL_COMMUNITY): Payer: Self-pay

## 2022-06-28 MED ORDER — LIOTHYRONINE SODIUM 5 MCG PO TABS
5.0000 ug | ORAL_TABLET | Freq: Three times a day (TID) | ORAL | 3 refills | Status: DC
  Filled 2022-06-28: qty 270, 90d supply, fill #0
  Filled 2022-11-01: qty 270, 90d supply, fill #1
  Filled 2023-03-03: qty 270, 90d supply, fill #2

## 2022-06-28 MED ORDER — LISDEXAMFETAMINE DIMESYLATE 50 MG PO CAPS
50.0000 mg | ORAL_CAPSULE | Freq: Every day | ORAL | 0 refills | Status: DC
  Filled 2022-06-28 – 2022-07-26 (×2): qty 30, 30d supply, fill #0

## 2022-06-28 MED ORDER — LISDEXAMFETAMINE DIMESYLATE 50 MG PO CAPS
50.0000 mg | ORAL_CAPSULE | Freq: Every day | ORAL | 0 refills | Status: DC
  Filled 2022-06-28: qty 30, 30d supply, fill #0

## 2022-06-28 MED ORDER — LISDEXAMFETAMINE DIMESYLATE 50 MG PO CAPS
50.0000 mg | ORAL_CAPSULE | Freq: Every day | ORAL | 0 refills | Status: DC
  Filled 2022-06-28 – 2022-07-27 (×2): qty 30, 30d supply, fill #0

## 2022-06-28 NOTE — Telephone Encounter (Signed)
Refill sent.

## 2022-06-29 ENCOUNTER — Other Ambulatory Visit: Payer: Self-pay | Admitting: Obstetrics & Gynecology

## 2022-06-29 DIAGNOSIS — N912 Amenorrhea, unspecified: Secondary | ICD-10-CM | POA: Diagnosis not present

## 2022-06-30 LAB — FOLLICLE STIMULATING HORMONE: FSH: 26.7 m[IU]/mL

## 2022-07-04 ENCOUNTER — Ambulatory Visit (INDEPENDENT_AMBULATORY_CARE_PROVIDER_SITE_OTHER): Payer: 59 | Admitting: Psychiatry

## 2022-07-04 ENCOUNTER — Ambulatory Visit: Payer: 59 | Admitting: Podiatry

## 2022-07-04 DIAGNOSIS — F4310 Post-traumatic stress disorder, unspecified: Secondary | ICD-10-CM

## 2022-07-04 DIAGNOSIS — M7751 Other enthesopathy of right foot: Secondary | ICD-10-CM

## 2022-07-04 DIAGNOSIS — L600 Ingrowing nail: Secondary | ICD-10-CM

## 2022-07-04 NOTE — Patient Instructions (Signed)

## 2022-07-04 NOTE — Progress Notes (Signed)
      [facility] Counselor/Therapist Progress Note  Patient ID: Barbara Wood, MRN: [id number],    Date: 07/04/2022  Time Spent: 54 minutes start time 2:03 PM end time 2:57 PM  Treatment Type: Individual Therapy  Reported Symptoms: anxiety, triggered responses  Mental Status Exam:  Appearance:   Well Groomed     Behavior:  Appropriate  Motor:  Normal  Speech/Language:   Normal Rate  Affect:  Appropriate  Mood:  agetated  Thought process:  normal  Thought content:    WNL  Sensory/Perceptual disturbances:    WNL  Orientation:  oriented to person, place, time/date, and situation  Attention:  Good  Concentration:  Good  Memory:  WNL  Fund of knowledge:   Good  Insight:    Good  Judgment:   Good  Impulse Control:  Good   Risk Assessment: Danger to Self:  No Self-injurious Behavior: No Danger to Others: No Duty to Warn:no Physical Aggression / Violence:No  Access to Firearms a concern: No  Gang Involvement:No   Subjective: Patient was present for session.  She shared that prior to session her ex sent her a message about wanting to pay him 2 years longer alimony. She was able to set a limit with him which was a good thing. She was able to follow through with plans from  Interventions: {PSY:[phone]}  Diagnosis:   ICD-10-CM   1. PTSD (post-traumatic stress disorder)  F43.10       Plan: ***  [name], [facility]

## 2022-07-05 NOTE — Progress Notes (Signed)
Subjective:   Patient ID: Barbara Wood, female   DOB: 52 y.o.   MRN: [id number]   HPI Patient states her toe is doing very well after procedure and that her chronic ingrown toenails are very tender and she is here to have them corrected on both feet.  She she has tried numerous conservative treatments over the years with trimming and other modalities without relief   ROS      Objective:  Physical Exam  Neurovascular status found to be intact incurvated medial border of the hallux bilateral that are painful when pressed no active drainage no redness noted currently just pain     Assessment:  Chronic ingrown toenail deformity hallux bilateral with pain     Plan:  H&P reviewed condition recommended correction allowed her to sign consent form understanding procedure risk and today I infiltrated each hallux 60 mg like Marcaine mixture sterile prep done and using sterile instrumentation remove the medial borders exposed the matrix applied phenol 3 applications of phenol 30 seconds followed by alcohol lavage sterile dressing gave instructions on soaks and to leave dressings on 24 hours but take them off earlier if throbbing were to occur and encouraged her to call with questions or concerns which may arise

## 2022-07-10 ENCOUNTER — Other Ambulatory Visit (HOSPITAL_COMMUNITY): Payer: Self-pay

## 2022-07-10 ENCOUNTER — Telehealth: Payer: Self-pay | Admitting: *Deleted

## 2022-07-10 MED ORDER — DOXYCYCLINE HYCLATE 100 MG PO TABS
100.0000 mg | ORAL_TABLET | Freq: Two times a day (BID) | ORAL | 0 refills | Status: DC
  Filled 2022-07-10: qty 28, 14d supply, fill #0

## 2022-07-10 NOTE — Telephone Encounter (Signed)
Patient is calling with concerns that her post procedural toe may becoming infected. It is more painful,red than it's been all week. Can an antibiotic be called into pharmacy, cannot take Augmentin.Please advise

## 2022-07-11 ENCOUNTER — Other Ambulatory Visit: Payer: Self-pay | Admitting: Podiatry

## 2022-07-11 NOTE — Telephone Encounter (Signed)
Looks like antibiotic was called in yesterday. I would check on her in a few days

## 2022-07-12 ENCOUNTER — Ambulatory Visit: Payer: 59 | Admitting: Psychiatry

## 2022-07-24 NOTE — Progress Notes (Unsigned)
[place] Phone: [phone] Subjective:   Barbara [name], am serving as a scribe for Dr. Hulan [name].  I'm seeing this patient by the request  of:  Barbara [name], Barbara Halsted, MD  CC: Back and neck pain follow-up  [id number]  Barbara [name] is a 52 y.o. female coming in with complaint of back and neck pain. OMT on 06/07/2022. Patient states that she was doing some weight training which has helped shoulder pain. Neck pain worse yesterday due to weather but is ok today.  [name] radicular symptoms.  Medications patient has been prescribed: None  Taking:         Reviewed prior external information including notes and imaging from previsou exam, outside providers and external EMR if available.   As well as notes that were available from care everywhere and other healthcare systems.  Recently seen by gynecology for postmenstrual bleeding.  Has a ultrasound ordered in the near future.  Past medical history, social, surgical and family history all reviewed in electronic medical record.  [name]   Past Medical History:  Diagnosis Date   Allergy Age 64   Asthma Since age 65   Depression Age 67   Hashimoto's disease    Heart murmur Mitral valve prolapse age 33   Migraine    Psoriasis    Thyroid disease Since age 49    Allergies  Allergen Reactions   Amoxicillin-Pot Clavulanate Nausea And Vomiting    Augmentin causes nausea and vomiting   Sertraline Other (See Comments)    hallucinations    Sulfamethoxazole-Trimethoprim Hives   Triptans Other (See Comments)    Chest pain      Review of Systems:  [name] headache, visual changes, nausea, vomiting, diarrhea, constipation, dizziness, abdominal pain, skin rash, fevers, chills, night sweats, weight loss, swollen lymph nodes, body aches, joint swelling, chest pain, shortness of breath, mood changes.  POSITIVE muscle aches  Objective  Blood pressure 124/74, pulse 87, height '5\' 7"'$  (1.702 m), weight 197 lb (89.4 kg), last menstrual period 10/31/2019, SpO2 98 %.   General: [name] apparent distress alert and oriented x3 mood and affect normal, dressed appropriately.  HEENT: Pupils equal, extraocular movements intact  Respiratory: Patient's speak in full sentences and does not appear short of breath  Cardiovascular: [name] lower extremity edema, non tender, [name] erythema  Gait knee: Normal to inspection with [name] erythema or effusion or obvious bony abnormalities. Palpation normal with [name] warmth, joint line tenderness, patellar tenderness, or condyle tenderness. ROM full in flexion and extension and lower leg rotation. Ligaments with solid consistent endpoints including ACL, PCL, LCL, MCL. Negative Mcmurray's, Apley's, and Thessalonian tests. Non painful patellar compression. Patellar glide without crepitus. Patellar and quadriceps tendons unremarkable. Hamstring and quadriceps strength is normal. MSK:  Back low back exam does have some loss of lordosis.  Some tenderness to palpation in the paraspinal musculature.  Osteopathic findings  C2 flexed rotated and side bent right C7 flexed rotated and side bent left T3 extended rotated and side bent right inhaled rib T5 extended rotated and side bent left L2 flexed rotated and side bent right Sacrum right on right       Assessment and Plan:  Degenerative disc disease, cervical Known degenerative disc disease.  Does respond relatively well though to osteopathic manipulation.  Could be potentially one of the causes though patient has headaches from time to time.  We discussed with patient to continue to work on posture and ergonomics.  Has been working out on a more regular basis as well.  Follow-up again in 6 weeks    Nonallopathic problems  Decision today to treat with OMT was based on Physical Exam  After verbal consent patient was treated  with HVLA, ME, FPR techniques in cervical, rib, thoracic, lumbar, and sacral  areas  Patient tolerated the procedure well with improvement in symptoms  Patient given exercises, stretches and lifestyle modifications  See medications in patient instructions if given  Patient will follow up in 4-8 weeks    The above documentation has been reviewed and is accurate and complete Lyndal Pulley, DO          Note: This dictation was prepared with Dragon dictation along with smaller phrase technology. Any transcriptional errors that result from this process are unintentional.

## 2022-07-25 ENCOUNTER — Ambulatory Visit (INDEPENDENT_AMBULATORY_CARE_PROVIDER_SITE_OTHER): Payer: 59 | Admitting: Family Medicine

## 2022-07-25 ENCOUNTER — Other Ambulatory Visit (HOSPITAL_COMMUNITY): Payer: Self-pay

## 2022-07-25 ENCOUNTER — Other Ambulatory Visit: Payer: Self-pay | Admitting: Family Medicine

## 2022-07-25 ENCOUNTER — Telehealth: Payer: Self-pay | Admitting: *Deleted

## 2022-07-25 ENCOUNTER — Other Ambulatory Visit: Payer: Self-pay | Admitting: Internal Medicine

## 2022-07-25 VITALS — BP 124/74 | HR 87 | Ht 67.0 in | Wt 197.0 lb

## 2022-07-25 DIAGNOSIS — M9903 Segmental and somatic dysfunction of lumbar region: Secondary | ICD-10-CM | POA: Diagnosis not present

## 2022-07-25 DIAGNOSIS — M9908 Segmental and somatic dysfunction of rib cage: Secondary | ICD-10-CM

## 2022-07-25 DIAGNOSIS — M9902 Segmental and somatic dysfunction of thoracic region: Secondary | ICD-10-CM | POA: Diagnosis not present

## 2022-07-25 DIAGNOSIS — M9901 Segmental and somatic dysfunction of cervical region: Secondary | ICD-10-CM | POA: Diagnosis not present

## 2022-07-25 DIAGNOSIS — G43909 Migraine, unspecified, not intractable, without status migrainosus: Secondary | ICD-10-CM

## 2022-07-25 DIAGNOSIS — M9904 Segmental and somatic dysfunction of sacral region: Secondary | ICD-10-CM

## 2022-07-25 DIAGNOSIS — M5030 Other cervical disc degeneration, unspecified cervical region: Secondary | ICD-10-CM | POA: Diagnosis not present

## 2022-07-25 DIAGNOSIS — F988 Other specified behavioral and emotional disorders with onset usually occurring in childhood and adolescence: Secondary | ICD-10-CM

## 2022-07-25 MED ORDER — LISDEXAMFETAMINE DIMESYLATE 50 MG PO CAPS
50.0000 mg | ORAL_CAPSULE | Freq: Every day | ORAL | 0 refills | Status: DC
  Filled 2022-07-25: qty 30, 30d supply, fill #0

## 2022-07-25 MED ORDER — EMGALITY 120 MG/ML ~~LOC~~ SOAJ
120.0000 mg | SUBCUTANEOUS | 3 refills | Status: DC
  Filled 2022-07-25: qty 3, 90d supply, fill #0
  Filled 2022-09-28: qty 3, 90d supply, fill #1

## 2022-07-25 MED ORDER — NURTEC 75 MG PO TBDP
1.0000 | ORAL_TABLET | Freq: Every day | ORAL | 2 refills | Status: DC | PRN
Start: 2022-07-25 — End: 2022-09-11
  Filled 2022-07-25: qty 30, 19d supply, fill #0
  Filled 2022-07-25: qty 16, 30d supply, fill #0
  Filled 2022-08-30: qty 16, 30d supply, fill #1

## 2022-07-25 NOTE — Assessment & Plan Note (Signed)
Known degenerative disc disease.  Does respond relatively well though to osteopathic manipulation.  Could be potentially one of the causes though patient has headaches from time to time.  We discussed with patient to continue to work on posture and ergonomics.  Has been working out on a more regular basis as well.  Follow-up again in 6 weeks

## 2022-07-25 NOTE — Telephone Encounter (Signed)
Prior Josem Kaufmann has been started for : [name] (Key: [id number])

## 2022-07-26 ENCOUNTER — Other Ambulatory Visit (HOSPITAL_COMMUNITY): Payer: Self-pay

## 2022-07-27 ENCOUNTER — Other Ambulatory Visit (HOSPITAL_COMMUNITY): Payer: Self-pay

## 2022-07-31 ENCOUNTER — Ambulatory Visit (INDEPENDENT_AMBULATORY_CARE_PROVIDER_SITE_OTHER): Payer: 59 | Admitting: Psychiatry

## 2022-07-31 DIAGNOSIS — F4310 Post-traumatic stress disorder, unspecified: Secondary | ICD-10-CM

## 2022-07-31 NOTE — Telephone Encounter (Signed)
Fax received from [facility] on 07/27/22  Based upon the information we received from you/your healthcare practitioner, we are unable to authorize [name] ODT '75MG'$  TABLET at this time. There reason(s) for this determination is (are): We were asked to cover a larger amount of the drug listed at the top of this letter than allowed under our prior authorization rule. Your doctor requested a quantity of #30 tablets every 30 days, but for adult patients being treated for acute migraine, our guideline named RIMEGEPANT (generic name for [name] ODT) allows for approval of maximum of #16 tablets per 30 days. Your request has been denied b/c use of [name] ODT at the requested does of #30 tablets every 30 days is not clinically supported. The maximum recommended dose of [name] ODT to treat your conditions is #16 tablets every 30 days.  If you prescriber has research studies showing this dose is safe & will work for you, please have your prescriber resubmit a request for prior authorization with this missing information. Please note that a prior authorization has already been approved through 10/30/22 with a quantity limit of #16 tablets per 30 days.

## 2022-07-31 NOTE — Progress Notes (Signed)
      [facility] Counselor/Therapist Progress Note  Patient ID: Barbara Wood, MRN: [id number],    Date: 07/31/2022  Time Spent: 54 minutes start time 2:58 PM end time 3:52  Treatment Type: Individual Therapy  Reported Symptoms: anxiety, triggered responses  Mental Status Exam:  Appearance:   Well Groomed     Behavior:  Appropriate  Motor:  Normal  Speech/Language:   Normal Rate  Affect:  Appropriate  Mood:  normal  Thought process:  normal  Thought content:    WNL  Sensory/Perceptual disturbances:    WNL  Orientation:  oriented to person, place, time/date, and situation  Attention:  Good  Concentration:  Good  Memory:  WNL  Fund of knowledge:   Good  Insight:    Good  Judgment:   Good  Impulse Control:  Good   Risk Assessment: Danger to Self:  No Self-injurious Behavior: No Danger to Others: No Duty to Warn:no Physical Aggression / Violence:No  Access to Firearms a concern: No  Gang Involvement:No   Subjective: Patient was present for session. 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  She shared that she had a good vacation and that she was doing better with her boyfriend.  She went on to share she has had difficulty recently because different people have maintained inappropriate comments to her including her boyfriend's best friend, brother-in-law, and a couple patient's.  Patient stated it is very difficult for her to want to lose weight when these things happen.  Met back to when she first felt that she wanted to gain weight so she did not get comments.  Was able to identify being 77 and at a debate conference and everyone was staring at her chest, suds level 7, negative cognition "I have no brain" felt to staying in contempt in her stomach.  Patient was able to reduce suds level to 2.  She was able to recognize that she did nothing wrong and she had been dressing appropriately acting appropriately she just could not help her  physiology.  She was also able to recognize her teacher make sure he looked her in the eyes and that made her feel affirmed and let her know that other men could do the same.  Encouraged patient to affirm her younger self regularly and to work on feeling good about her body.  Interventions: Eye Movement Desensitization and Reprocessing (EMDR) and Insight-Oriented  Diagnosis:   ICD-10-CM   1. PTSD (post-traumatic stress disorder)  F43.10       Plan: Patient is to use coping skills to decrease triggered responses.  Patient is to practice visual from session to affirm the younger parts of her.  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  Patient is to allow processing to continue between sessions .  Patient is to continue working on limit setting with daughter and ex-husband.  Patient is to release negative emotions through exercise  Long-term goal: Develop healthy boundaries Short-term goal: Resolve the trauma from past Give her daughter more stability Trying to navigate divorce and have good boundaries Deal with childhood trauma Figure out relationship with [name], Barbara Wood

## 2022-08-01 ENCOUNTER — Other Ambulatory Visit (HOSPITAL_COMMUNITY): Payer: Self-pay

## 2022-08-02 ENCOUNTER — Ambulatory Visit
Admission: RE | Admit: 2022-08-02 | Discharge: 2022-08-02 | Disposition: A | Discharge status: Home / Self Care | Payer: 59 | Source: Ambulatory Visit | Attending: Radiology | Admitting: Radiology

## 2022-08-02 DIAGNOSIS — N950 Postmenopausal bleeding: Secondary | ICD-10-CM | POA: Diagnosis not present

## 2022-08-03 ENCOUNTER — Other Ambulatory Visit: Payer: Self-pay

## 2022-08-03 DIAGNOSIS — R9389 Abnormal findings on diagnostic imaging of other specified body structures: Secondary | ICD-10-CM

## 2022-08-06 ENCOUNTER — Other Ambulatory Visit (HOSPITAL_COMMUNITY): Payer: Self-pay

## 2022-08-07 ENCOUNTER — Ambulatory Visit: Payer: 59 | Admitting: Family Medicine

## 2022-08-07 VITALS — BP 112/74 | HR 84 | Ht 67.0 in

## 2022-08-07 DIAGNOSIS — M9908 Segmental and somatic dysfunction of rib cage: Secondary | ICD-10-CM

## 2022-08-07 DIAGNOSIS — M5030 Other cervical disc degeneration, unspecified cervical region: Secondary | ICD-10-CM | POA: Diagnosis not present

## 2022-08-07 DIAGNOSIS — M9902 Segmental and somatic dysfunction of thoracic region: Secondary | ICD-10-CM | POA: Diagnosis not present

## 2022-08-07 DIAGNOSIS — M9901 Segmental and somatic dysfunction of cervical region: Secondary | ICD-10-CM

## 2022-08-07 DIAGNOSIS — M25512 Pain in left shoulder: Secondary | ICD-10-CM

## 2022-08-07 NOTE — Assessment & Plan Note (Signed)
Chronic problem with exacerbation.  Discussed icing regimen and home exercises.  Discussed avoiding certain activities.  Follow-up again in 6 to 8 weeks

## 2022-08-07 NOTE — Assessment & Plan Note (Signed)
Chronic problem with some mild worsening symptoms.  Patient likely did have more of the myofascial pain in the migraines that contributed to this.  Did have trigger point so that we did do injections to see if this will be beneficial.  Seems to be in the trapezius, rhomboid, levator scapula.  Discussed with patient about icing regimen and home exercises otherwise.  Patient will continue to work on posture and ergonomics.  Medications including diclofenac patient can use if necessary and can consider more of the Skelaxin which patient has been prescribed before today.  Follow-up again in 6 weeks

## 2022-08-07 NOTE — Progress Notes (Signed)
[place] Phone: [phone] Subjective:   Barbara Barbara [name] Barbara [name], am serving as a scribe for Dr. Hulan Saas.  I'm seeing this patient by the request  of:  [name], MD  CC: Neck pain, headache, scapular pain  [id number]  Barbara Barbara [name] Barbara [name] is a 52 y.o. female coming in with complaint of back and neck pain. OMT 07/25/2022. Patient states that her upper thoracic spine is spasming. Pain and migraines since last Monday. About 80% better today.  Medications patient has been prescribed:   Taking:         Reviewed prior external information including notes and imaging from previsou exam, outside providers and external EMR if available.   As well as notes that were available from care everywhere and other healthcare systems.  Past medical history, social, surgical and family history all reviewed in electronic medical record.  Barbara [name]   Past Medical History:  Diagnosis Date   Allergy Age 25   Asthma Since age 80   Depression Age 70   Hashimoto's disease    Heart murmur Mitral valve prolapse age 89   Migraine    Psoriasis    Thyroid disease Since age 63    Allergies  Allergen Reactions   Amoxicillin-Pot Clavulanate Nausea And Vomiting    Augmentin causes nausea and vomiting   Sertraline Other (See Comments)    hallucinations    Sulfamethoxazole-Trimethoprim Hives   Triptans Other (See Comments)    Chest pain      Review of Systems:  Barbara [name] headache, visual changes, nausea, vomiting, diarrhea, constipation, dizziness, abdominal pain, skin rash, fevers, chills, night sweats, weight loss, swollen lymph nodes, body aches, joint swelling, chest pain, shortness of breath, mood changes. POSITIVE muscle aches  Objective  Blood pressure 112/74, pulse 84, height '5\' 7"'$  (1.702 m), last menstrual period 10/31/2019, SpO2 98 %.   General: Barbara [name]  apparent distress alert and oriented x3 mood and affect normal, dressed appropriately.  HEENT: Pupils equal, extraocular movements intact  Respiratory: Patient's speak in full sentences and does not appear short of breath  Cardiovascular: Barbara [name] lower extremity edema, non tender, Barbara [name] erythema  Gait MSK:  Neck exam does have some loss of lordosis.  Some tenderness to palpation in the paraspinal musculature.  More tightness noted on the left side of the neck as well as the left parascapular area.  Patient does have multiple trigger points noted in the rhomboid and trapezius as well as potentially the levator scapula.  Osteopathic findings  C2 flexed rotated and side bent right C6 flexed rotated and side bent left T3 extended rotated and side bent right inhaled rib    After verbal consent patient was prepped with alcohol swab and with a 25-gauge half inch needle injected into 3 distinct trigger points using a total of 2 cc of 0.5% Marcaine and 1 cc of Kenalog 40 mg per mill.  Minimal blood loss.  Band-Aid is placed.  Postinjection instructions given    Assessment and Plan:  Degenerative disc disease, cervical Chronic problem with exacerbation.  Discussed icing regimen and home exercises.  Discussed avoiding certain activities.  Follow-up again in 6 to 8 weeks  Trigger point of left shoulder region Chronic problem with some mild worsening symptoms.  Patient likely did have more of the myofascial pain in the migraines that contributed to this.  Did have trigger point so that we did  do injections to see if this will be beneficial.  Seems to be in the trapezius, rhomboid, levator scapula.  Discussed with patient about icing regimen and home exercises otherwise.  Patient will continue to work on posture and ergonomics.  Medications including diclofenac patient can use if necessary and can consider more of the Skelaxin which patient has been prescribed before today.  Follow-up again in 6 weeks     Nonallopathic problems  Decision today to treat with OMT was based on Physical Exam  After verbal consent patient was treated with HVLA, ME, FPR techniques in cervical, rib, thoracic areas  Patient tolerated the procedure well with improvement in symptoms  Patient given exercises, stretches and lifestyle modifications  See medications in patient instructions if given  Patient will follow up in 4-8 weeks     The above documentation has been reviewed and is accurate and complete Lyndal Pulley, DO         Note: This dictation was prepared with Dragon dictation along with smaller phrase technology. Any transcriptional errors that result from this process are unintentional.

## 2022-08-07 NOTE — Patient Instructions (Signed)
Good to see you Trigger point injections today See me at next visit

## 2022-08-09 ENCOUNTER — Ambulatory Visit (INDEPENDENT_AMBULATORY_CARE_PROVIDER_SITE_OTHER): Payer: 59 | Admitting: Psychiatry

## 2022-08-09 DIAGNOSIS — F4310 Post-traumatic stress disorder, unspecified: Secondary | ICD-10-CM

## 2022-08-09 NOTE — Progress Notes (Signed)
      [facility] Counselor/Therapist Progress Note  Patient ID: Barbara Wood, MRN: [id number],    Date: 08/09/2022  Time Spent: 56 minutes 10:05 AM end time 11:00 AM  Treatment Type: Individual Therapy  Reported Symptoms: anxiety, sadness, triggered responses  Mental Status Exam:  Appearance:   Well Groomed     Behavior:  Appropriate  Motor:  Normal  Speech/Language:   Normal Rate  Affect:  Appropriate  Mood:  anxious  Thought process:  normal  Thought content:    WNL  Sensory/Perceptual disturbances:    WNL  Orientation:  oriented to person, place, time/date, and situation  Attention:  Good  Concentration:  Good  Memory:  WNL  Fund of knowledge:   Good  Insight:    Good  Judgment:   Good  Impulse Control:  Good   Risk Assessment: Danger to Self:  No Self-injurious Behavior: No Danger to Others: No Duty to Warn:no Physical Aggression / Violence:No  Access to Firearms a concern: No  Gang Involvement:No   Subjective: Patient was present for session.  She shared that has been a hard week due to her situation with her boyfriend.  Patient is also still working on her ex signing separation agreement and that has been difficult as well.  Patient did processing set on boyfriend not replying to text messages, suds level 8, negative cognition "I am easy to throw to the side" felt hurt and sadness in her eyes stomach and shoulders.  Patient was able to reduce suds level to 4.  She was able to recognize that she needs to use his choices and behaviors as information on how she wants to proceed with the relationship.  She was also able to recognize she can allow him to have the consequences for his choices and not feel anxious or like she is not enough.  Patient was able to discuss things she can do to prepare for the rest of the week while he is gone.  Also discussed different ways that she can communicate the concerns with him when he returns.  Interventions: Eye Movement  Desensitization and Reprocessing (EMDR) and Insight-Oriented  Diagnosis:   ICD-10-CM   1. PTSD (post-traumatic stress disorder)  F43.10       Plan:  Patient is to use coping skills to decrease triggered responses.  Patient is to practice visual from session to affirm the younger parts of her.  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  Patient is to allow processing to continue between sessions .  Patient is to continue working on limit setting with daughter and ex-husband.  Patient is to release negative emotions through exercise  Long-term goal: Develop healthy boundaries Short-term goal: Resolve the trauma from past Give her daughter more stability Trying to navigate divorce and have good boundaries Deal with childhood trauma Figure out relationship with [name], [facility]

## 2022-08-21 ENCOUNTER — Other Ambulatory Visit (HOSPITAL_COMMUNITY)
Admission: RE | Admit: 2022-08-21 | Discharge: 2022-08-21 | Disposition: A | Discharge status: Home / Self Care | Payer: 59 | Source: Ambulatory Visit | Attending: Radiology | Admitting: Radiology

## 2022-08-21 ENCOUNTER — Ambulatory Visit: Payer: 59 | Admitting: Radiology

## 2022-08-21 ENCOUNTER — Other Ambulatory Visit (HOSPITAL_COMMUNITY): Payer: Self-pay

## 2022-08-21 DIAGNOSIS — R9389 Abnormal findings on diagnostic imaging of other specified body structures: Secondary | ICD-10-CM | POA: Insufficient documentation

## 2022-08-21 NOTE — Progress Notes (Signed)
      ENDOMETRIAL BIOPSY       Latika Supinski 52 y.o. presents for endometrial biopsy. Reason for biopsy: Thickened endometrium 55m  Today's Vitals   08/21/22 1430  BP: 122/76   There is no height or weight on file to calculate BMI.  Past Medical History:  Diagnosis Date   Allergy Age 47   Asthma Since age 52  Depression Age 52  Hashimoto's disease    Heart murmur Mitral valve prolapse age [age]  Migraine    Psoriasis    Thyroid disease Since age 52    The indications for endometrial biopsy were reviewed.    Risks of the biopsy including cramping, bleeding, infection, uterine perforation, inadequate specimen and need for additional procedures  were discussed.  The patient states she understands and agrees to undergo procedure today. Consent obtained.  Time out was performed.   Speculum inserted into the vagina, cervix visualized and was prepped with Betadine. A single-toothed tenaculum was placed on the anterior lip of the cervix to stabilize it.  The 3 mm pipelle was introduced into the endometrial cavity without difficulty to a depth of 8cm, suction initiated and a moderate amount of tissue was obtained and sent to pathology.  The instruments were removed from the patient's vagina.  Minimal bleeding from the cervix was noted.  The patient tolerated the procedure well.     Assessment/Plan: 1. Thickened endometrium  - Endometrial biopsy - Surgical pathology( [place]/ [facility])  Routine post-procedure instructions were given to the patient.   Will contact with results of biopsy.     [facility]bie Battiest [facility]

## 2022-08-22 ENCOUNTER — Ambulatory Visit: Payer: 59 | Admitting: Psychiatry

## 2022-08-22 ENCOUNTER — Other Ambulatory Visit (HOSPITAL_COMMUNITY): Payer: Self-pay

## 2022-08-23 ENCOUNTER — Other Ambulatory Visit: Payer: Self-pay | Admitting: Radiology

## 2022-08-23 ENCOUNTER — Other Ambulatory Visit (HOSPITAL_COMMUNITY): Payer: Self-pay

## 2022-08-23 DIAGNOSIS — R9389 Abnormal findings on diagnostic imaging of other specified body structures: Secondary | ICD-10-CM

## 2022-08-23 DIAGNOSIS — H5212 Myopia, left eye: Secondary | ICD-10-CM | POA: Diagnosis not present

## 2022-08-23 DIAGNOSIS — H5213 Myopia, bilateral: Secondary | ICD-10-CM | POA: Diagnosis not present

## 2022-08-23 DIAGNOSIS — H52223 Regular astigmatism, bilateral: Secondary | ICD-10-CM | POA: Diagnosis not present

## 2022-08-23 LAB — SURGICAL PATHOLOGY

## 2022-08-23 MED ORDER — PROGESTERONE MICRONIZED 100 MG PO CAPS
100.0000 mg | ORAL_CAPSULE | Freq: Every day | ORAL | 3 refills | Status: DC
  Filled 2022-08-23: qty 30, 30d supply, fill #0
  Filled 2022-09-23: qty 30, 30d supply, fill #1
  Filled 2022-10-22: qty 8, 8d supply, fill #2

## 2022-08-24 ENCOUNTER — Telehealth: Payer: Self-pay

## 2022-08-24 NOTE — Telephone Encounter (Signed)
Yes, this is common after EMB. Begin the progesterone tonight and bleeding will likely slow down.

## 2022-08-24 NOTE — Telephone Encounter (Signed)
Pt calling to report minimal bleeding after endo bx on 08/21/22. However, started heavily bleeding as of last night. Reports she is soaking a super tampon q 2-3 hours. Is also having some cramping but nothing significant pain wise. She denied taking the progesterone as of yet. Desires to know if this is normal or to be expected?  [facility] 06/29/22-26.7  Last LMP recorded 10/31/2019  Please advise.

## 2022-08-24 NOTE — Telephone Encounter (Signed)
Pt notified and voiced understanding 

## 2022-08-29 ENCOUNTER — Other Ambulatory Visit: Payer: Self-pay | Admitting: *Deleted

## 2022-08-29 ENCOUNTER — Ambulatory Visit: Payer: 59 | Admitting: Internal Medicine

## 2022-08-29 DIAGNOSIS — F988 Other specified behavioral and emotional disorders with onset usually occurring in childhood and adolescence: Secondary | ICD-10-CM

## 2022-08-29 NOTE — Progress Notes (Signed)
[place] Phone: [phone] Subjective:   Barbara [name], am serving as a scribe for Dr. Hulan Saas.  I'm seeing this patient by the request  of:  Barbara [name], Barbara Halsted, MD  CC: Back and neck pain follow-up  NTI:[id number]  Barbara [name] is a 52 y.o. female coming in with complaint of back and neck pain. OMT on 08/07/2022. Patient states that she had some cervical spine pain but this went away.   Medications patient has been prescribed: None  Taking:         Reviewed prior external information including notes and imaging from previsou exam, outside providers and external EMR if available.   As well as notes that were available from care everywhere and other healthcare systems.  Past medical history, social, surgical and family history all reviewed in electronic medical record.  [name]   Past Medical History:  Diagnosis Date   Allergy Age 18   Asthma Since age 38   Depression Age 53   Hashimoto's disease    Heart murmur Mitral valve prolapse age 26   Migraine    Psoriasis    Thyroid disease Since age 35    Allergies  Allergen Reactions   Amoxicillin-Pot Clavulanate Nausea And Vomiting    Augmentin causes nausea and vomiting   Sertraline Other (See Comments)    hallucinations    Sulfamethoxazole-Trimethoprim Hives   Triptans Other (See Comments)    Chest pain      Review of Systems:  [name] headache, visual changes, nausea, vomiting, diarrhea, constipation, dizziness, abdominal pain, skin rash, fevers, chills, night sweats, weight loss, swollen lymph nodes, body aches, joint swelling, chest pain, shortness of breath, mood changes. POSITIVE muscle aches  Objective  Blood pressure 110/72, pulse 82, height '5\' 7"'$  (1.702 m), weight 194 lb (88 kg), last menstrual period 10/31/2019, SpO2 98 %.   General: [name] apparent distress  alert and oriented x3 mood and affect normal, dressed appropriately.  HEENT: Pupils equal, extraocular movements intact  Respiratory: Patient's speak in full sentences and does not appear short of breath  Cardiovascular: [name] lower extremity edema, non tender, [name] erythema  Gait normal  MSK:  Back does have some loss of lordosis.  Some tenderness to palpation in the paraspinal musculature.  Patient's neck exam does have some limited sidebending bilaterally.  Seems to be worse right greater than left.  Osteopathic findings  C2 flexed rotated and side bent left C6 flexed rotated and side bent right C7 flexed rotated and side bent left T3 extended rotated and side bent right inhaled rib T9 extended rotated and side bent left L2 flexed rotated and side bent right Sacrum right on right       Assessment and Plan:  Degenerative disc disease, cervical Known chronic problem with mild exacerbation.  Seems to be more on the C2 in the C6 and C7 area.  We discussed with patient about different posture ergonomics.  I seem to have some mild increase in stress recently.  Did not make any significant changes with me running late and patient having to go to her daughter's appointment as well.  Patient will follow-up with me again in 5 to 6 weeks for further evaluation.  Also recently did have a friend who had been had some head trauma and may be traveling and may need to see me sooner.    Nonallopathic problems  Decision today to treat with OMT was based on Physical Exam  After verbal consent patient was treated with HVLA, ME, FPR techniques in cervical, rib, thoracic, lumbar, and sacral  areas  Patient tolerated the procedure well with improvement in symptoms  Patient given exercises, stretches and lifestyle modifications  See medications in patient instructions if given  Patient will follow up in 4-8 weeks     The above documentation has been reviewed and is accurate and complete Barbara Pulley, DO         Note: This dictation was prepared with Dragon dictation along with smaller phrase technology. Any transcriptional errors that result from this process are unintentional.

## 2022-08-30 ENCOUNTER — Other Ambulatory Visit (HOSPITAL_COMMUNITY): Payer: Self-pay

## 2022-08-30 MED ORDER — LISDEXAMFETAMINE DIMESYLATE 50 MG PO CAPS
50.0000 mg | ORAL_CAPSULE | Freq: Every day | ORAL | 0 refills | Status: DC
  Filled 2022-08-30 – 2022-10-30 (×2): qty 30, 30d supply, fill #0

## 2022-08-30 MED ORDER — LISDEXAMFETAMINE DIMESYLATE 50 MG PO CAPS
50.0000 mg | ORAL_CAPSULE | Freq: Every day | ORAL | 0 refills | Status: DC
  Filled 2022-08-30 (×2): qty 30, 30d supply, fill #0

## 2022-08-30 MED ORDER — LISDEXAMFETAMINE DIMESYLATE 50 MG PO CAPS
50.0000 mg | ORAL_CAPSULE | Freq: Every day | ORAL | 0 refills | Status: DC
  Filled 2022-08-30 – 2022-09-28 (×2): qty 30, 30d supply, fill #0

## 2022-09-04 ENCOUNTER — Ambulatory Visit (INDEPENDENT_AMBULATORY_CARE_PROVIDER_SITE_OTHER): Payer: 59 | Admitting: Psychiatry

## 2022-09-04 DIAGNOSIS — F4310 Post-traumatic stress disorder, unspecified: Secondary | ICD-10-CM

## 2022-09-04 NOTE — Progress Notes (Signed)
      [facility] Counselor/Therapist Progress Note  Patient ID: Barbara Wood, MRN: [id number],    Date: 09/04/2022  Time Spent: 50 minutes start time 3:07 PM end time 3:57 PM  Treatment Type: Individual Therapy  Reported Symptoms: anxiety, sadness, triggered responses  Mental Status Exam:  Appearance:   Well Groomed     Behavior:  Appropriate  Motor:  Normal  Speech/Language:   Normal Rate  Affect:  Appropriate  Mood:  anxious  Thought process:  normal  Thought content:    WNL  Sensory/Perceptual disturbances:    WNL  Orientation:  oriented to person, place, time/date, and situation  Attention:  Good  Concentration:  Good  Memory:  WNL  Fund of knowledge:   Good  Insight:    Good  Judgment:   Good  Impulse Control:  Good   Risk Assessment: Danger to Self:  No Self-injurious Behavior: No Danger to Others: No Duty to Warn:no Physical Aggression / Violence:No  Access to Firearms a concern: No  Gang Involvement:No   Subjective: Patient was present for session. She shared that she had to call the police for a welfare check on her daughter and her father.  Patient explained that things continue to be very difficult with that her ex who is not signing the separation agreement and continuing to try and change things with caretaking of their daughter.  Patient stated that the babysitter is documenting everything and she was very upset over the situation where she had to call the police due to daughter and father being missing for hours.  Patient shared that things are going much better with her boyfriend and she is pleased with that situation.  She is also trying to help a friend who was in a scooter accident and ended up with a traumatic brain injury.  Patient explained the family is being difficult and she is not sure she will be able to go to [place] and be helpful.  Encouraged patient to stay focused on what she can control fix and change and recognize she has to allow them to  make bad decisions and feel the consequences of those if necessary.  Patient was able to recognize that she is making progress and is to continue working on self-care and self talk to keep things moving in a good direction. Interventions: Cognitive Behavioral Therapy and Solution-Oriented/Positive Psychology  Diagnosis:   ICD-10-CM   1. PTSD (post-traumatic stress disorder)  F43.10       Plan:  Patient is to use coping skills to decrease triggered responses.  Patient is to focus on what she can control fix and change.  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  Patient is to allow processing to continue between sessions .  Patient is to continue working on limit setting with daughter and ex-husband.  Patient is to release negative emotions through exercise  Long-term goal: Develop healthy boundaries Short-term goal: Resolve the trauma from past Give her daughter more stability Trying to navigate divorce and have good boundaries Deal with childhood trauma Figure out relationship with [name], [facility]

## 2022-09-05 ENCOUNTER — Ambulatory Visit: Payer: 59 | Admitting: Family Medicine

## 2022-09-05 ENCOUNTER — Encounter: Payer: Self-pay | Admitting: Family Medicine

## 2022-09-05 VITALS — BP 110/72 | HR 82 | Ht 67.0 in | Wt 194.0 lb

## 2022-09-05 DIAGNOSIS — M9902 Segmental and somatic dysfunction of thoracic region: Secondary | ICD-10-CM | POA: Diagnosis not present

## 2022-09-05 DIAGNOSIS — M9904 Segmental and somatic dysfunction of sacral region: Secondary | ICD-10-CM

## 2022-09-05 DIAGNOSIS — M9908 Segmental and somatic dysfunction of rib cage: Secondary | ICD-10-CM

## 2022-09-05 DIAGNOSIS — M9901 Segmental and somatic dysfunction of cervical region: Secondary | ICD-10-CM

## 2022-09-05 DIAGNOSIS — M5030 Other cervical disc degeneration, unspecified cervical region: Secondary | ICD-10-CM

## 2022-09-05 DIAGNOSIS — M9903 Segmental and somatic dysfunction of lumbar region: Secondary | ICD-10-CM | POA: Diagnosis not present

## 2022-09-05 NOTE — Assessment & Plan Note (Signed)
Known chronic problem with mild exacerbation.  Seems to be more on the C2 in the C6 and C7 area.  We discussed with patient about different posture ergonomics.  I seem to have some mild increase in stress recently.  Did not make any significant changes with me running late and patient having to go to her daughter's appointment as well.  Patient will follow-up with me again in 5 to 6 weeks for further evaluation.  Also recently did have a friend who had been had some head trauma and may be traveling and may need to see me sooner.

## 2022-09-11 ENCOUNTER — Encounter: Payer: Self-pay | Admitting: Internal Medicine

## 2022-09-11 ENCOUNTER — Ambulatory Visit: Payer: 59 | Admitting: Internal Medicine

## 2022-09-11 ENCOUNTER — Other Ambulatory Visit (HOSPITAL_COMMUNITY): Payer: Self-pay

## 2022-09-11 VITALS — BP 120/80 | HR 76 | Temp 98.2°F | Wt 197.6 lb

## 2022-09-11 DIAGNOSIS — G43909 Migraine, unspecified, not intractable, without status migrainosus: Secondary | ICD-10-CM | POA: Diagnosis not present

## 2022-09-11 DIAGNOSIS — Z23 Encounter for immunization: Secondary | ICD-10-CM | POA: Diagnosis not present

## 2022-09-11 DIAGNOSIS — E063 Autoimmune thyroiditis: Secondary | ICD-10-CM | POA: Diagnosis not present

## 2022-09-11 DIAGNOSIS — M5030 Other cervical disc degeneration, unspecified cervical region: Secondary | ICD-10-CM

## 2022-09-11 MED ORDER — NURTEC 75 MG PO TBDP
1.0000 | ORAL_TABLET | Freq: Every day | ORAL | 2 refills | Status: DC | PRN
  Filled 2022-09-11 – 2022-09-28 (×2): qty 16, 30d supply, fill #0
  Filled 2022-11-01 – 2022-11-06 (×4): qty 16, 30d supply, fill #1

## 2022-09-11 NOTE — Progress Notes (Addendum)
Established Patient Office Visit   Addendum: Since this office visit Emgality has been discontinued in favor for Aimovig for migraine prevention as she was not having good results with the Emgality.  She continues to use [name] as needed for migraine relief.   CC/Reason for Visit: 81-monthfollow-up chronic conditions  HPI: Barbara Wood a 52y.o. female who is coming in today for the above mentioned reasons. Past Medical History is significant for: ADHD on stable dose of Vyvanse for approximately 9 years, she has a very significant history of migraines that is relatively well controlled on Emgality and [name].  She has myofascial pain and cervical disc disease gets OMT by Dr. ZGardenia Phlegmshe takes tramadol daily and has a very sporadic prescription for Norco for migraine relief when the [name] and Emgality do not take care of it.  She also has a history of impaired glucose tolerance.  She also has hypothyroidism.  Since I last saw her she had an endometrial biopsy due to vaginal bleeding and found to have a thickened endometrium to 8 mm on ultrasound.  Biopsy was normal and she is now on progesterone.  She is requesting a refill of [name] today.  She is also due to check her TSH as it was over suppressed last time requiring a dose reduction of levothyroxine to 100 mcg.   Past Medical/Surgical History: Past Medical History:  Diagnosis Date   Allergy Age 89   Asthma Since age 52  Depression Age 52  Hashimoto's disease    Heart murmur Mitral valve prolapse age 52  Migraine    Psoriasis    Thyroid disease Since age 52   Past Surgical History:  Procedure Laterality Date   BREAST SURGERY  Reduction age 52  CHOLECYSTECTOMY  Age 89  KNEE ARTHROSCOPY W/ LATERAL RELEASE Right    REDUCTION MAMMAPLASTY     WISDOM TOOTH EXTRACTION      Social History:  reports that she has never smoked. She has never used smokeless tobacco. She reports current alcohol use. She reports that she does  not use drugs.  Allergies: Allergies  Allergen Reactions   Amoxicillin-Pot Clavulanate Nausea And Vomiting    Augmentin causes nausea and vomiting   Sertraline Other (See Comments)    hallucinations    Sulfamethoxazole-Trimethoprim Hives   Triptans Other (See Comments)    Chest pain     Family History:  Family History  Problem Relation Age of Onset   Asthma Sister    Miscarriages / SKoreaSister    Depression Sister    Psoriasis Sister    Cancer Mother    Depression Mother    Hashimoto's thyroiditis Mother    Diabetes Mother    Bipolar disorder Mother    Hypertension Father    Ankylosing spondylitis Father    Psoriasis Father    Psoriasis Brother    Migraines Brother    ADD / ADHD Brother    Eczema Brother    Allergies Brother    Psoriasis Maternal Aunt    Psoriasis Sister    Arthritis Sister        psoriatic arthritis    Psoriasis Sister    Rheum arthritis Sister    Depression Sister    Anxiety disorder Sister    Eczema Sister    Psoriasis Brother    ADD / ADHD Daughter    Eczema Daughter    Asthma Daughter  Current Outpatient Medications:    albuterol (PROAIR HFA) 108 (90 Base) MCG/ACT inhaler, Inhale 2 puffs into the lungs every 6 (six) hours as needed for wheezing or shortness of breath., Disp: 18 g, Rfl: 2   ARIPiprazole (ABILIFY) 10 MG tablet, Take 1 tablet (10 mg total) by mouth daily., Disp: 90 tablet, Rfl: 3   atenolol (TENORMIN) 25 MG tablet, Take 1 tablet (25 mg total) by mouth 2 (two) times daily. (Patient taking differently: Take 25 mg by mouth daily.), Disp: 180 tablet, Rfl: 3   betamethasone dipropionate 0.05 % cream, Apply topically 2 (two) times daily., Disp: 45 g, Rfl: 5   betamethasone dipropionate 0.05 % lotion, Apply 1 application topically to the scalp daily. Taper use as able., Disp: 120 mL, Rfl: 3   budesonide-formoterol (SYMBICORT) 160-4.5 MCG/ACT inhaler, as needed., Disp: , Rfl:    cetirizine (ZYRTEC) 10 MG tablet, Take 1  tablet by mouth 2 (two) times a day., Disp: , Rfl:    Cholecalciferol (VITAMIN D3) 250 MCG (10000 UT) capsule, Take 10,000 Units by mouth daily., Disp: , Rfl:    COVID-19 At Home Antigen Test (CARESTART COVID-19 HOME TEST) KIT, use as directed, Disp: 8 each, Rfl: 0   diclofenac (VOLTAREN) 25 MG EC tablet, Take 75 mg by mouth 2 (two) times daily., Disp: , Rfl:    Fluocinolone Acetonide Scalp (DERMA-SMOOTHE/FS SCALP) 0.01 % OIL, Apply TID, Disp: 118.28 mL, Rfl: 5   Galcanezumab-gnlm (EMGALITY) 120 MG/ML SOAJ, Inject 120 mg into the skin every 30 (thirty) days., Disp: 3 mL, Rfl: 3   HYDROcodone-acetaminophen (NORCO) 5-325 MG tablet, Take 1 tablet by mouth every 6 (six) hours as needed for moderate pain., Disp: 30 tablet, Rfl: 0   levothyroxine (SYNTHROID) 100 MCG tablet, Take 1 tablet (100 mcg total) by mouth daily., Disp: 90 tablet, Rfl: 1   lidocaine (LIDODERM) 5 %, Place 1 patch onto the skin daily. Remove & Discard patch within 12 hours or as directed by MD, Disp: 30 patch, Rfl: 2   liothyronine (CYTOMEL) 5 MCG tablet, Take 1 tablet (5 mcg total) by mouth in the morning, at noon, and at bedtime., Disp: 270 tablet, Rfl: 3   lisdexamfetamine (VYVANSE) 50 MG capsule, Take 1 capsule (50 mg total) by mouth daily., Disp: 30 capsule, Rfl: 0   [START ON 09/29/2022] lisdexamfetamine (VYVANSE) 50 MG capsule, Take 1 capsule (50 mg total) by mouth daily., Disp: 30 capsule, Rfl: 0   lisdexamfetamine (VYVANSE) 50 MG capsule, Take 1 capsule (50 mg total) by mouth daily., Disp: 30 capsule, Rfl: 0   metaxalone (SKELAXIN) 800 MG tablet, Take 1 tablet (800 mg total) by mouth 3 (three) times daily., Disp: 90 tablet, Rfl: 1   montelukast (SINGULAIR) 10 MG tablet, Take 1 tablet (10 mg total) by mouth at bedtime., Disp: 90 tablet, Rfl: 3   omeprazole (PRILOSEC) 20 MG capsule, Take 1 capsule (20 mg total) by mouth daily., Disp: 30 capsule, Rfl: 3   progesterone (PROMETRIUM) 100 MG capsule, Take 1 capsule (100 mg total) by  mouth daily., Disp: 30 capsule, Rfl: 3   promethazine (PHENERGAN) 25 MG tablet, Take 1 tablet (25 mg total) by mouth every 6 (six) hours as needed for nausea or vomiting., Disp: 30 tablet, Rfl: 3   Rimegepant Sulfate ([name]) 75 MG TBDP, Take 1 tablet by mouth daily as needed., Disp: 16 tablet, Rfl: 2   traMADol (ULTRAM) 50 MG tablet, Take 2 tablets (100 mg total) by mouth 2 (two) times daily., Disp: 360  tablet, Rfl: 0   traZODone (DESYREL) 50 MG tablet, Take 1-2 tablets (50-100 mg total) by mouth at bedtime as needed for sleep., Disp: 60 tablet, Rfl: 1  Review of Systems:  Constitutional: Denies fever, chills, diaphoresis, appetite change and fatigue.  HEENT: Denies photophobia, eye pain, redness, hearing loss, ear pain, congestion, sore throat, rhinorrhea, sneezing, mouth sores, trouble swallowing, neck pain, neck stiffness and tinnitus.   Respiratory: Denies SOB, DOE, cough, chest tightness,  and wheezing.   Cardiovascular: Denies chest pain, palpitations and leg swelling.  Gastrointestinal: Denies nausea, vomiting, abdominal pain, diarrhea, constipation, blood in stool and abdominal distention.  Genitourinary: Denies dysuria, urgency, frequency, hematuria, flank pain and difficulty urinating.  Endocrine: Denies: hot or cold intolerance, sweats, changes in hair or nails, polyuria, polydipsia. Hematological: Denies adenopathy. Easy bruising, personal or family bleeding history  Psychiatric/Behavioral: Denies suicidal ideation, mood changes, confusion, nervousness, sleep disturbance and agitation    Physical Exam: Vitals:   09/11/22 1513  BP: 120/80  Pulse: 76  Temp: 98.2 F (36.8 C)  TempSrc: Oral  SpO2: 98%  Weight: 197 lb 9.6 oz (89.6 kg)    Body mass index is 30.95 kg/m.   Constitutional: NAD, calm, comfortable Eyes: PERRL, lids and conjunctivae normal, wears corrective lenses ENMT: Mucous membranes are moist.  Respiratory: clear to auscultation bilaterally, no wheezing, no  crackles. Normal respiratory effort. No accessory muscle use.  Cardiovascular: Regular rate and rhythm, no murmurs / rubs / gallops. No extremity edema. Psychiatric: Normal judgment and insight. Alert and oriented x 3. Normal mood.    Impression and Plan:  Hashimoto's disease - Plan: TSH  Migraine without status migrainosus, not intractable, unspecified migraine type - Plan: Rimegepant Sulfate ([name]) 75 MG TBDP  Degenerative disc disease, cervical  Need for influenza vaccination  -Flu vaccine administered today. -Check TSH, at last visit levothyroxine was reduced to 100 mcg due to an over suppressed TSH. -[name] has been refilled for 16 tablets a month.  Time spent:23 minutes reviewing chart, interviewing and examining patient and formulating plan of care.      Lelon Frohlich, MD [facility]

## 2022-09-12 LAB — TSH: TSH: 0.46 u[IU]/mL (ref 0.35–5.50)

## 2022-09-23 ENCOUNTER — Other Ambulatory Visit: Payer: Self-pay | Admitting: Internal Medicine

## 2022-09-23 DIAGNOSIS — G4700 Insomnia, unspecified: Secondary | ICD-10-CM

## 2022-09-23 DIAGNOSIS — M7918 Myalgia, other site: Secondary | ICD-10-CM

## 2022-09-23 DIAGNOSIS — G43909 Migraine, unspecified, not intractable, without status migrainosus: Secondary | ICD-10-CM

## 2022-09-24 ENCOUNTER — Other Ambulatory Visit (HOSPITAL_COMMUNITY): Payer: Self-pay

## 2022-09-24 ENCOUNTER — Other Ambulatory Visit: Payer: Self-pay | Admitting: Internal Medicine

## 2022-09-24 MED ORDER — ARIPIPRAZOLE 10 MG PO TABS
10.0000 mg | ORAL_TABLET | Freq: Every day | ORAL | 3 refills | Status: DC
Start: 2022-09-24 — End: 2023-10-08
  Filled 2022-09-24: qty 90, 90d supply, fill #0
  Filled 2023-01-07: qty 90, 90d supply, fill #1
  Filled 2023-04-09: qty 90, 90d supply, fill #2
  Filled 2023-07-16: qty 90, 90d supply, fill #3

## 2022-09-24 MED ORDER — TRAMADOL HCL 50 MG PO TABS
100.0000 mg | ORAL_TABLET | Freq: Two times a day (BID) | ORAL | 0 refills | Status: DC
  Filled 2022-09-24: qty 360, 90d supply, fill #0

## 2022-09-24 MED ORDER — TRAZODONE HCL 50 MG PO TABS
50.0000 mg | ORAL_TABLET | Freq: Every evening | ORAL | 1 refills | Status: DC | PRN
  Filled 2022-09-24: qty 60, 30d supply, fill #0
  Filled 2022-11-06: qty 60, 30d supply, fill #1

## 2022-09-24 NOTE — Progress Notes (Unsigned)
[place] [address] [place] Phone: [phone] Subjective:   IVilma Wood, am serving as a scribe for Dr. Hulan Saas.  I'm seeing this patient by the request  of:  Isaac Bliss, Rayford Halsted, MD  CC: Back and neck pain follow-up  BJS:[id number]  Barbara Wood is a 52 y.o. female coming in with complaint of back and neck pain. OMT 09/05/2022. Patient states here for manipulation. Did a lot of driving over vacation last week. Has some tightness. No new issues.  Medications patient has been prescribed: None  Taking:         Reviewed prior external information including notes and imaging from previsou exam, outside providers and external EMR if available.   As well as notes that were available from care everywhere and other healthcare systems.  Past medical history, social, surgical and family history all reviewed in electronic medical record.  No pertanent information unless stated regarding to the chief complaint.   Past Medical History:  Diagnosis Date   Allergy Age 48   Asthma Since age 52   Depression Age 58   Hashimoto's disease    Heart murmur Mitral valve prolapse age 45   Migraine    Psoriasis    Thyroid disease Since age 30    Allergies  Allergen Reactions   Amoxicillin-Pot Clavulanate Nausea And Vomiting    Augmentin causes nausea and vomiting   Sertraline Other (See Comments)    hallucinations    Sulfamethoxazole-Trimethoprim Hives   Triptans Other (See Comments)    Chest pain      Review of Systems:  No  visual changes, nausea, vomiting, diarrhea, constipation, dizziness, abdominal pain, skin rash, fevers, chills, night sweats, weight loss, swollen lymph nodes, body aches, joint swelling, chest pain, shortness of breath, mood changes. POSITIVE muscle aches, headache  Objective  Blood pressure 132/78, pulse (!) 103, height '5\' 7"'$  (1.702 m), weight 197 lb (89.4 kg), last menstrual period 10/31/2019,  SpO2 99 %.   General: No apparent distress alert and oriented x3 mood and affect normal, dressed appropriately.  HEENT: Pupils equal, extraocular movements intact  Respiratory: Patient's speak in full sentences and does not appear short of breath  Cardiovascular: No lower extremity edema, non tender, no erythema  Neck exam does have some mild loss of lordosis.  Patient does have some limited sidebending bilaterally.  Osteopathic findings  C2 flexed rotated and side bent right C6 flexed rotated and side bent left T3 extended rotated and side bent right inhaled rib T6 extended rotated and side bent left L2 flexed rotated and side bent right Sacrum right on right     Assessment and Plan:  Degenerative disc disease, cervical Patient does have some loss of lordosis.  Some crepitus noted.  Patient does have some tightness noted with sidebending bilaterally.  Patient does have still some headaches.  Patient will be working with her primary care provider to see if there is anything else that can be done.  Follow-up with me again in 6 to 8 weeks    Nonallopathic problems  Decision today to treat with OMT was based on Physical Exam  After verbal consent patient was treated with HVLA, ME, FPR techniques in cervical, rib, thoracic, lumbar, and sacral  areas  Patient tolerated the procedure well with improvement in symptoms  Patient given exercises, stretches and lifestyle modifications  See medications in patient instructions if given  Patient will follow up in 4-8 weeks  The above documentation has been reviewed and is accurate and complete Barbara Pulley, DO         Note: This dictation was prepared with Dragon dictation along with smaller phrase technology. Any transcriptional errors that result from this process are unintentional.

## 2022-09-26 ENCOUNTER — Encounter: Payer: Self-pay | Admitting: Family Medicine

## 2022-09-26 ENCOUNTER — Ambulatory Visit: Payer: 59 | Admitting: Family Medicine

## 2022-09-26 ENCOUNTER — Ambulatory Visit (INDEPENDENT_AMBULATORY_CARE_PROVIDER_SITE_OTHER): Payer: 59 | Admitting: Psychiatry

## 2022-09-26 VITALS — BP 132/78 | HR 103 | Ht 67.0 in | Wt 197.0 lb

## 2022-09-26 DIAGNOSIS — M9903 Segmental and somatic dysfunction of lumbar region: Secondary | ICD-10-CM | POA: Diagnosis not present

## 2022-09-26 DIAGNOSIS — M9901 Segmental and somatic dysfunction of cervical region: Secondary | ICD-10-CM | POA: Diagnosis not present

## 2022-09-26 DIAGNOSIS — M9908 Segmental and somatic dysfunction of rib cage: Secondary | ICD-10-CM

## 2022-09-26 DIAGNOSIS — M9902 Segmental and somatic dysfunction of thoracic region: Secondary | ICD-10-CM | POA: Diagnosis not present

## 2022-09-26 DIAGNOSIS — M5030 Other cervical disc degeneration, unspecified cervical region: Secondary | ICD-10-CM

## 2022-09-26 DIAGNOSIS — F4310 Post-traumatic stress disorder, unspecified: Secondary | ICD-10-CM

## 2022-09-26 DIAGNOSIS — M9904 Segmental and somatic dysfunction of sacral region: Secondary | ICD-10-CM

## 2022-09-26 NOTE — Assessment & Plan Note (Signed)
Patient does have some loss of lordosis.  Some crepitus noted.  Patient does have some tightness noted with sidebending bilaterally.  Patient does have still some headaches.  Patient will be working with her primary care provider to see if there is anything else that can be done.  Follow-up with me again in 6 to 8 weeks

## 2022-09-26 NOTE — Progress Notes (Signed)
      [facility] Counselor/Therapist Progress Note  Patient ID: Barbara Wood, MRN: [id number],    Date: 09/26/2022  Time Spent: 50 minutes start time 2:02 PM end time 2:52 PM  Treatment Type: Individual Therapy  Reported Symptoms: migraines, anxiety, triggered responses, sadness  Mental Status Exam:  Appearance:   Well Groomed     Behavior:  Appropriate  Motor:  Normal  Speech/Language:   Normal Rate  Affect:  Appropriate  Mood:  anxious  Thought process:  normal  Thought content:    WNL  Sensory/Perceptual disturbances:    WNL  Orientation:  oriented to person, place, time/date, and situation  Attention:  Good  Concentration:  Good  Memory:  WNL  Fund of knowledge:   Good  Insight:    Good  Judgment:   Good  Impulse Control:  Good   Risk Assessment: Danger to Self:  No Self-injurious Behavior: No Danger to Others: No Duty to Warn:no Physical Aggression / Violence:No  Access to Firearms a concern: No  Gang Involvement:No   Subjective: Patient was present for session. She shared she has been having lots of migraines which has been difficult.  She shared she went on vacation and came back with lots of issues including a stack of papers and things for her daughter.  Patient shared she is feeling better overall.  She explained that her father is having kidney issues and her family look to her to try and fix it and she was able to set appropriate boundaries.  Patient stated she was feeling good about her decision and recognized that she has been able to have positive progress with EMDR.  Patient went on to share that things are going well with her boyfriend and that has been good.  Also some things have come out with her daughter having issues with being bullied at school but they are getting resolved and she and her daughter are at a better place since her daughter has felt her support and being able to handle things appropriately.  Discussed the importance of her continuing to  work on issues and finding ways to work in self care on a daily basis.  Patient explained that she is having difficulties finding ways to use typical grounding skills.  Problem solved with patient and a plan to help her work on deep breathing relaxation and stretching or yoga exercises throughout the day to help relieve stress would developed in session.  Interventions: Solution-Oriented/Positive Psychology and Insight-Oriented  Diagnosis:   ICD-10-CM   1. PTSD (post-traumatic stress disorder)  F43.10       Plan: Patient is to use coping skills to decrease triggered responses.  Patient is to work on diaphragmatic breathing safe place exercise and stretching/yoga throughout the day.  Patient is to focus on what she can control fix and change.  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  Patient is to allow processing to continue between sessions .  Patient is to continue working on limit setting with daughter and ex-husband.  Patient is to release negative emotions through exercise  Long-term goal: Develop healthy boundaries Short-term goal: Resolve the trauma from past Give her daughter more stability Trying to navigate divorce and have good boundaries Deal with childhood trauma Figure out relationship with [name], [facility]

## 2022-09-28 ENCOUNTER — Other Ambulatory Visit (HOSPITAL_COMMUNITY): Payer: Self-pay

## 2022-10-03 ENCOUNTER — Encounter: Payer: Self-pay | Admitting: Podiatry

## 2022-10-03 ENCOUNTER — Ambulatory Visit: Payer: 59 | Admitting: Podiatry

## 2022-10-03 DIAGNOSIS — M7751 Other enthesopathy of right foot: Secondary | ICD-10-CM

## 2022-10-03 MED ORDER — TRIAMCINOLONE ACETONIDE 10 MG/ML IJ SUSP
10.0000 mg | Freq: Once | INTRAMUSCULAR | Status: AC
  Administered 2022-10-03: 10 mg

## 2022-10-09 ENCOUNTER — Other Ambulatory Visit: Payer: Self-pay | Admitting: Internal Medicine

## 2022-10-09 ENCOUNTER — Other Ambulatory Visit (HOSPITAL_COMMUNITY): Payer: Self-pay

## 2022-10-09 DIAGNOSIS — G43909 Migraine, unspecified, not intractable, without status migrainosus: Secondary | ICD-10-CM

## 2022-10-09 MED ORDER — AIMOVIG 140 MG/ML ~~LOC~~ SOAJ
140.0000 mg | SUBCUTANEOUS | 5 refills | Status: DC
  Filled 2022-10-09 – 2022-10-23 (×4): qty 1, 30d supply, fill #0

## 2022-10-16 ENCOUNTER — Telehealth: Payer: Self-pay | Admitting: *Deleted

## 2022-10-16 NOTE — Telephone Encounter (Signed)
Prior Auth was denied for Aimovig. Appeal was faxed

## 2022-10-16 NOTE — Progress Notes (Deleted)
  [place] Phone: [phone] Subjective:    I'm seeing this patient by the request  of:  Isaac Bliss, Rayford Halsted, MD  CC:   [id number]  Barbara Wood is a 52 y.o. female coming in with complaint of back and neck pain. OMT on 09/26/2022. Patient states   Medications patient has been prescribed: None  Taking:         Reviewed prior external information including notes and imaging from previsou exam, outside providers and external EMR if available.   As well as notes that were available from care everywhere and other healthcare systems.  Past medical history, social, surgical and family history all reviewed in electronic medical record.  No pertanent information unless stated regarding to the chief complaint.   Past Medical History:  Diagnosis Date   Allergy Age 13   Asthma Since age 89   Depression Age 60   Hashimoto's disease    Heart murmur Mitral valve prolapse age 69   Migraine    Psoriasis    Thyroid disease Since age 59    Allergies  Allergen Reactions   Amoxicillin-Pot Clavulanate Nausea And Vomiting    Augmentin causes nausea and vomiting   Sertraline Other (See Comments)    hallucinations    Sulfamethoxazole-Trimethoprim Hives   Triptans Other (See Comments)    Chest pain      Review of Systems:  No headache, visual changes, nausea, vomiting, diarrhea, constipation, dizziness, abdominal pain, skin rash, fevers, chills, night sweats, weight loss, swollen lymph nodes, body aches, joint swelling, chest pain, shortness of breath, mood changes. POSITIVE muscle aches  Objective  Last menstrual period 10/31/2019.   General: No apparent distress alert and oriented x3 mood and affect normal, dressed appropriately.  HEENT: Pupils equal, extraocular movements intact  Respiratory: Patient's speak in full sentences and does not appear short of breath  Cardiovascular: No lower extremity  edema, non tender, no erythema  Gait MSK:  Back   Osteopathic findings  C2 flexed rotated and side bent right C6 flexed rotated and side bent left T3 extended rotated and side bent right inhaled rib T9 extended rotated and side bent left L2 flexed rotated and side bent right Sacrum right on right       Assessment and Plan:  No problem-specific Assessment & Plan notes found for this encounter.    Nonallopathic problems  Decision today to treat with OMT was based on Physical Exam  After verbal consent patient was treated with HVLA, ME, FPR techniques in cervical, rib, thoracic, lumbar, and sacral  areas  Patient tolerated the procedure well with improvement in symptoms  Patient given exercises, stretches and lifestyle modifications  See medications in patient instructions if given  Patient will follow up in 4-8 weeks             Note: This dictation was prepared with Dragon dictation along with smaller phrase technology. Any transcriptional errors that result from this process are unintentional.

## 2022-10-17 ENCOUNTER — Ambulatory Visit (INDEPENDENT_AMBULATORY_CARE_PROVIDER_SITE_OTHER): Payer: 59 | Admitting: Psychiatry

## 2022-10-17 ENCOUNTER — Other Ambulatory Visit (HOSPITAL_COMMUNITY): Payer: Self-pay

## 2022-10-17 DIAGNOSIS — F4310 Post-traumatic stress disorder, unspecified: Secondary | ICD-10-CM | POA: Diagnosis not present

## 2022-10-17 NOTE — Progress Notes (Signed)
      [facility] Counselor/Therapist Progress Note  Patient ID: Barbara Wood, MRN: [id number],    Date: 10/17/2022  Time Spent: 51 minutes start time 2:02 PM end time 2:53 PM  Treatment Type: Individual Therapy  Reported Symptoms: triggered responses, flashbacks, anxiety, nightmares  Mental Status Exam:  Appearance:   Well Groomed     Behavior:  Appropriate  Motor:  Normal  Speech/Language:   Normal Rate  Affect:  Appropriate  Mood:  anxious  Thought process:  normal  Thought content:    WNL  Sensory/Perceptual disturbances:    WNL  Orientation:  oriented to person, place, time/date, and situation  Attention:  Good  Concentration:  Good  Memory:  WNL  Fund of knowledge:   Good  Insight:    Good  Judgment:   Good  Impulse Control:  Good   Risk Assessment: Danger to Self:  No Self-injurious Behavior: No Danger to Others: No Duty to Warn:no Physical Aggression / Violence:No  Access to Firearms a concern: No  Gang Involvement:No   Subjective: Patient was present for session. She shared that she has been getting triggered by her boyfriend.  She was able to recognize some times when he seems agitated is triggering memories of her father.  Did processing set on hearing dad take off his belt, suds level 10, negative cognition "I am not safe" felt sadness and overwhelmed in her throat.  Patient was able to reduce suds level to 3.  She was able to affirm the younger parts of her and let them know that they no longer have to have contact with her father.  Patient stated that realization and feeling that she was able to disconnect from him as an adult and a younger child was very freeing and reduced level of disturbance.  She did share she had been having some nightmares agreed if those nightmares continued those would be addressed at next session.  Interventions: Eye Movement Desensitization and Reprocessing (EMDR) and Insight-Oriented  Diagnosis:   ICD-10-CM   1. PTSD  (post-traumatic stress disorder)  F43.10       Plan: Patient is to use coping skills to decrease triggered responses.  Patient is to work on diaphragmatic breathing safe place exercise and stretching/yoga throughout the day.  Patient is to focus on what she can control fix and change.  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  Patient is to allow processing to continue between sessions .  Patient is to continue working on limit setting with daughter and ex-husband.  Patient is to release negative emotions through exercise  Long-term goal: Develop healthy boundaries Short-term goal: Resolve the trauma from past Give her daughter more stability Trying to navigate divorce and have good boundaries Deal with childhood trauma Figure out relationship with [name], [facility]

## 2022-10-18 ENCOUNTER — Ambulatory Visit: Payer: 59 | Admitting: Family Medicine

## 2022-10-18 NOTE — Telephone Encounter (Signed)
[employer] has received the request for a standard Level 2 appeal.  They are reviewing it.  We will be notified by phone or by mail no later than 45 days.

## 2022-10-18 NOTE — Telephone Encounter (Signed)
PA was denied. Please addend to the last office note

## 2022-10-18 NOTE — Telephone Encounter (Signed)
Office note faxed.

## 2022-10-22 ENCOUNTER — Encounter: Payer: Self-pay | Admitting: Internal Medicine

## 2022-10-22 ENCOUNTER — Telehealth: Payer: Self-pay | Admitting: Internal Medicine

## 2022-10-22 ENCOUNTER — Other Ambulatory Visit: Payer: Self-pay | Admitting: Internal Medicine

## 2022-10-22 ENCOUNTER — Other Ambulatory Visit (HOSPITAL_COMMUNITY): Payer: Self-pay

## 2022-10-22 DIAGNOSIS — G43909 Migraine, unspecified, not intractable, without status migrainosus: Secondary | ICD-10-CM

## 2022-10-22 NOTE — Telephone Encounter (Signed)
Pt requesting Galcanezumab-gnlm (EMGALITY) 120 MG/ML SOAJ  be reissued because there is an appeal for Erenumab-aooe (AIMOVIG) 140 MG/ML SOAJ. Pt states she is getting migraines 20-25 days a month.

## 2022-10-23 ENCOUNTER — Other Ambulatory Visit (HOSPITAL_COMMUNITY): Payer: Self-pay

## 2022-10-23 MED ORDER — EMGALITY 120 MG/ML ~~LOC~~ SOSY
120.0000 mg | PREFILLED_SYRINGE | SUBCUTANEOUS | 3 refills | Status: DC
  Filled 2022-10-23 – 2022-11-05 (×3): qty 3, 90d supply, fill #0

## 2022-10-23 NOTE — Telephone Encounter (Signed)
Pt is aware rachel will contact dr Jerilee Hoh to see about emgality

## 2022-10-23 NOTE — Telephone Encounter (Signed)
Emgality rx sent per Dr Jerilee Hoh

## 2022-10-23 NOTE — Progress Notes (Unsigned)
[place] Phone: [phone] Subjective:   Barbara [name], am serving as a scribe for Dr. Hulan Saas.  I'm seeing this patient by the request  of:  Isaac Bliss, Rayford Halsted, MD  CC: Right hip pain, left shoulder pain neck pain    NOB:[id number]  Barbara [name] is a 52 y.o. female coming in with complaint of back and neck pain. OMT 09/26/2022. Patient has had migraines on most days of the month. Neck is tight and worse with rotation.   Patient states that she has been doing some strength training and the L shoulder seems to be irritated. Pain over anterior aspect.   Medications patient has been prescribed: None  Taking:         Reviewed prior external information including notes and imaging from previsou exam, outside providers and external EMR if available.   As well as notes that were available from care everywhere and other healthcare systems.  Past medical history, social, surgical and family history all reviewed in electronic medical record.  [name]   Past Medical History:  Diagnosis Date   Allergy Age 44   Asthma Since age 75   Depression Age 81   Hashimoto's disease    Heart murmur Mitral valve prolapse age 29   Migraine    Psoriasis    Thyroid disease Since age 61    Allergies  Allergen Reactions   Amoxicillin-Pot Clavulanate Nausea And Vomiting    Augmentin causes nausea and vomiting   Sertraline Other (See Comments)    hallucinations    Sulfamethoxazole-Trimethoprim Hives   Triptans Other (See Comments)    Chest pain      Review of Systems:  [name] headache, visual changes, nausea, vomiting, diarrhea, constipation, dizziness, abdominal pain, skin rash, fevers, chills, night sweats, weight loss, swollen lymph nodes, body aches, joint swelling, chest pain, shortness of breath, mood changes. POSITIVE muscle  aches  Objective  Blood pressure 130/88, pulse 74, height '5\' 7"'$  (1.702 m), last menstrual period 10/31/2019, SpO2 99 %.   General: [name] apparent distress alert and oriented x3 mood and affect normal, dressed appropriately.  HEENT: Pupils equal, extraocular movements intact  Respiratory: Patient's speak in full sentences and does not appear short of breath  Cardiovascular: [name] lower extremity edema, non tender, [name] erythema  Left shoulder exam does have some mild impingement noted.  Some limited internal range of motion to lateral hip.  The patient does have tenderness to palpation over the acromioclavicular joint.  Patient does have some tenderness with crossover test as well.  Patient does have significant tenderness over the gluteal tendon insertion over the greater trochanteric area.  Positive FABER test noted.  Neck exam still has some loss of lordosis.  More tightness on the left side of the neck than usual.  Patient initially has more tightness on the right side.  Osteopathic findings  C2 flexed rotated and side bent right C6 flexed rotated and side bent left T9 extended rotated and side bent left inhaled rib L2 flexed rotated and side bent right Sacrum right on right   Procedure: Real-time Ultrasound Guided Injection of left acromioclavicular joint Device: GE Logiq Q7 Ultrasound guided injection is preferred based studies that show increased duration, increased effect, greater accuracy, decreased procedural pain, increased response rate, and decreased cost with ultrasound guided versus blind injection.  Verbal informed consent obtained.  Time-out conducted.  Noted  [name] overlying erythema, induration, or other signs of local infection.  Skin prepped in a sterile fashion.  Local anesthesia: Topical Ethyl chloride.  With sterile technique and under real time ultrasound guidance: With a 25-gauge 1-1/2 inch needle patient was injected with 1 cc of 0.5% Marcaine and 1 cc of Kenalog 40 mg/mL.   [name] blood loss. Completed without difficulty  Pain immediately resolved suggesting accurate placement of the medication.  Advised to call if fevers/chills, erythema, induration, drainage, or persistent bleeding.  Impression: Technically successful ultrasound guided injection.  Procedure: Real-time Ultrasound Guided Injection of right gluteal tendon sheath Device: GE Logiq Q7 Ultrasound guided injection is preferred based studies that show increased duration, increased effect, greater accuracy, decreased procedural pain, increased response rate, and decreased cost with ultrasound guided versus blind injection.  Verbal informed consent obtained.  Time-out conducted.  Noted [name] overlying erythema, induration, or other signs of local infection.  Skin prepped in a sterile fashion.  Local anesthesia: Topical Ethyl chloride.  With sterile technique and under real time ultrasound guidance: With a 21-gauge 2 inch needle patient was injected with 1 cc of 0.5% Marcaine and 1 cc of Kenalog 40 mg/mL Completed without difficulty  Pain immediately resolved suggesting accurate placement of the medication.  Advised to call if fevers/chills, erythema, induration, drainage, or persistent bleeding.  Impression: Technically successful ultrasound guided injection.    Assessment and Plan:  Effusion of acromioclavicular joint, left Patient given injection and tolerated the procedure well, discussed icing regimen and home exercises, which activities to do and which ones to avoid.  Follow-up again in 6 to 8 weeks.  Worsening pain can always consider advanced imaging but I think patient hopefully will do well with conservative therapy.  Gluteal tendinitis of right buttock Patient given injection and tolerated the procedure well, discussed icing regimen and home exercises, which activities to do and which he has to avoid.  Discussed hip abductor strengthening.  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  Follow-up with me again in 6 to 8 weeks.    Nonallopathic problems  Decision today to treat with OMT was based on Physical Exam  After verbal consent patient was treated with HVLA, ME, FPR techniques in cervical, rib, thoracic, lumbar, and sacral  areas  Patient tolerated the procedure well with improvement in symptoms  Patient given exercises, stretches and lifestyle modifications  See medications in patient instructions if given  Patient will follow up in 4-8 weeks     The above documentation has been reviewed and is accurate and complete Barbara Pulley, DO         Note: This dictation was prepared with Dragon dictation along with smaller phrase technology. Any transcriptional errors that result from this process are unintentional.

## 2022-10-23 NOTE — Addendum Note (Signed)
Addended by: Westley Hummer B on: 10/23/2022 03:33 PM   Modules accepted: Orders

## 2022-10-23 NOTE — Addendum Note (Signed)
Addended by: Westley Hummer B on: 10/23/2022 03:32 PM   Modules accepted: Orders

## 2022-10-24 ENCOUNTER — Other Ambulatory Visit (HOSPITAL_COMMUNITY): Payer: Self-pay

## 2022-10-24 NOTE — Telephone Encounter (Signed)
PA started for Emgailty  Key: [id number]

## 2022-10-25 ENCOUNTER — Encounter: Payer: Self-pay | Admitting: Family Medicine

## 2022-10-25 ENCOUNTER — Ambulatory Visit: Payer: 59 | Admitting: Family Medicine

## 2022-10-25 ENCOUNTER — Telehealth: Payer: Self-pay | Admitting: *Deleted

## 2022-10-25 ENCOUNTER — Ambulatory Visit: Payer: Self-pay

## 2022-10-25 VITALS — BP 130/88 | HR 74 | Ht 67.0 in

## 2022-10-25 DIAGNOSIS — M25552 Pain in left hip: Secondary | ICD-10-CM

## 2022-10-25 DIAGNOSIS — M7601 Gluteal tendinitis, right hip: Secondary | ICD-10-CM

## 2022-10-25 DIAGNOSIS — M9903 Segmental and somatic dysfunction of lumbar region: Secondary | ICD-10-CM | POA: Diagnosis not present

## 2022-10-25 DIAGNOSIS — M9904 Segmental and somatic dysfunction of sacral region: Secondary | ICD-10-CM

## 2022-10-25 DIAGNOSIS — M9908 Segmental and somatic dysfunction of rib cage: Secondary | ICD-10-CM | POA: Diagnosis not present

## 2022-10-25 DIAGNOSIS — M9902 Segmental and somatic dysfunction of thoracic region: Secondary | ICD-10-CM | POA: Diagnosis not present

## 2022-10-25 DIAGNOSIS — M25412 Effusion, left shoulder: Secondary | ICD-10-CM | POA: Insufficient documentation

## 2022-10-25 DIAGNOSIS — R9389 Abnormal findings on diagnostic imaging of other specified body structures: Secondary | ICD-10-CM

## 2022-10-25 DIAGNOSIS — M9901 Segmental and somatic dysfunction of cervical region: Secondary | ICD-10-CM | POA: Diagnosis not present

## 2022-10-25 MED ORDER — PROGESTERONE MICRONIZED 100 MG PO CAPS: 100.0000 mg | ORAL_CAPSULE | Freq: Every day | ORAL | 0 refills | Status: DC

## 2022-10-25 NOTE — Telephone Encounter (Signed)
Patient called stating [facility] pharmacy said progesterone 100 mg capsules is on back order without a release date of return. I called [facility] (patient lives there) and spoke with pharmacist and he did say it is a shortage of medication [facility] of [facility]. I spoke with patient about this she said that being a [employer] employee she would prefer to stay with [facility] pharmacy. She has 8 pills left, but asked if you could switch her to another medication being no release date of progesterone. Please advise

## 2022-10-25 NOTE — Patient Instructions (Signed)
See me in 6 weeks

## 2022-10-25 NOTE — Telephone Encounter (Signed)
No other equivalent available. I would recommend getting it from [facility] for now.

## 2022-10-25 NOTE — Assessment & Plan Note (Signed)
Patient given injection and tolerated the procedure well, discussed icing regimen and home exercises, which activities to do and which he has to avoid.  Discussed hip abductor strengthening.  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  Follow-up with me again in 6 to 8 weeks.

## 2022-10-25 NOTE — Assessment & Plan Note (Signed)
Patient given injection and tolerated the procedure well, discussed icing regimen and home exercises, which activities to do and which ones to avoid.  Follow-up again in 6 to 8 weeks.  Worsening pain can always consider advanced imaging but I think patient hopefully will do well with conservative therapy.

## 2022-10-25 NOTE — Telephone Encounter (Signed)
Patient informed [facility]. I sent 90 day supply for patient, I informed patient that it can't confirm they will give her 90 day supply ,but I would send in.

## 2022-10-30 ENCOUNTER — Other Ambulatory Visit (HOSPITAL_COMMUNITY): Payer: Self-pay

## 2022-10-30 ENCOUNTER — Encounter: Payer: Self-pay | Admitting: Psychiatry

## 2022-10-30 ENCOUNTER — Ambulatory Visit: Payer: 59 | Admitting: Psychiatry

## 2022-10-30 VITALS — BP 139/92 | HR 91 | Ht 67.0 in | Wt 199.0 lb

## 2022-10-30 DIAGNOSIS — R519 Headache, unspecified: Secondary | ICD-10-CM

## 2022-10-30 DIAGNOSIS — G43E19 Chronic migraine with aura, intractable, without status migrainosus: Secondary | ICD-10-CM

## 2022-10-30 DIAGNOSIS — R29818 Other symptoms and signs involving the nervous system: Secondary | ICD-10-CM

## 2022-10-30 DIAGNOSIS — M5412 Radiculopathy, cervical region: Secondary | ICD-10-CM

## 2022-10-30 MED ORDER — UBRELVY 100 MG PO TABS: 100.0000 mg | ORAL_TABLET | ORAL | 0 refills | Status: DC | PRN

## 2022-10-30 MED ORDER — AIMOVIG 140 MG/ML ~~LOC~~ SOAJ: 140.0000 mg | SUBCUTANEOUS | 0 refills | Status: DC

## 2022-10-30 MED ORDER — LORAZEPAM 0.5 MG PO TABS
ORAL_TABLET | ORAL | 0 refills | Status: DC
  Filled 2022-10-30: qty 4, 2d supply, fill #0

## 2022-10-30 NOTE — Progress Notes (Signed)
Referring:  Isaac Bliss, Rayford Halsted, MD [address] [place],  [place] [address]  PCP: Isaac Bliss, Rayford Halsted, MD  Neurology was asked to evaluate Barbara Wood, a 52 year old female for a chief complaint of headaches.  Our recommendations of care will be communicated by shared medical record.    CC:  headaches  History provided from self, partner  HPI:  Medical co-morbidities: Hashimoto's disease  The patient presents for evaluation of worsening headaches. She had been on Emgality for the past 4.5 years, which initially controlled her migraines. However she had noticed her migraines started to worsen in the past 4 months. They are associated with photophobia, phonophobia, and nausea. She also will occasionally have a visual aura and aphasia. Had one episode of right-sided weakness with her migraine several years ago. She currently takes [name] for rescue, which takes the edge off but does not resolve her headache. PCP had switched Emgality to Aimovig, but this is still going through the insurance process. She is overdue for her injection.  She also reports chronic neck pain which has been worsening. Feels her neck is "catching" when she moves it, and has noticed numbness in her left 4th and 5th fingers. Feels some weakness in her pinky as well. Did PT for 6 months which did not resolve her symptoms.  She also had an episode of left foot numbness 4-5 months ago. This lasted for several months before it finally resolved. No associated back pain or weakness.  Headache History: Triggers: weather, neck pain Aura: visual aura, aphasia, one episode of right sided weakness Associated Symptoms:  Photophobia: yes  Phonophobia: yes  Nausea: yes Other symptoms: vertigo Worse with activity?: yes Duration of headaches:  Headache days per month: 14 Headache free days per month: 16  Current Treatment: Abortive [name] 75 mg PRN  Preventative none  Prior Therapies                                  Preventive: Topamax 100 mg PRN - word finding difficulty Keppra Atenolol 100 mg BID - lack of efficacy Verapamil - worsened migraines Amitriptyline Cymbalta - nausea and vomiting Lyrica - vertigo Emgality Botox  Rescue: Imitrex - palpitations 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 [name] 75 mg PRN Skelaxin Phergan Norco   LABS: CBC    Component Value Date/Time   WBC 6.6 12/14/2021 0724   RBC 4.37 12/14/2021 0724   HGB 12.1 12/14/2021 0724   HCT 37.0 12/14/2021 0724   PLT 223.0 12/14/2021 0724   MCV 84.6 12/14/2021 0724   MCH 27.3 03/29/2021 1336   MCHC 32.7 12/14/2021 0724   RDW 14.3 12/14/2021 0724   LYMPHSABS 1.6 12/14/2021 0724   MONOABS 0.5 12/14/2021 0724   EOSABS 0.2 12/14/2021 0724   BASOSABS 0.0 12/14/2021 0724      Latest Ref Rng & Units 12/14/2021    7:24 AM 03/29/2021    1:36 PM 10/19/2020    2:44 PM  CMP  Glucose 70 - 99 mg/dL 96  91    BUN 6 - 23 mg/dL 18  11    Creatinine 0.40 - 1.20 mg/dL 0.74  0.63    Sodium 135 - 145 mEq/L 141  139    Potassium 3.5 - 5.1 mEq/L 4.0  3.5    Chloride 96 - 112 mEq/L 104  104    CO2 19 - 32 mEq/L 31  26  Calcium 8.4 - 10.5 mg/dL 9.5  8.7    Total Protein 6.0 - 8.3 g/dL 6.8  6.9    Total Bilirubin 0.2 - 1.2 mg/dL 0.4  0.6    Alkaline Phos 39 - 117 U/L 85  73    AST 0 - 37 U/L _0 ALT 0 - 35 U/L _1 IMAGING:  Reportedly had a normal brain MRI 5 years ago  C-spine Xray 2021 with C5-6 narrowing  Imaging independently reviewed on October 30, 2022   Current Outpatient Medications on File Prior to Visit  Medication Sig Dispense Refill   albuterol (PROAIR HFA) 108 (90 Base) MCG/ACT inhaler Inhale 2 puffs into the lungs every 6 (six) hours as needed for wheezing or shortness of breath. 18 g 2   ARIPiprazole (ABILIFY) 10 MG tablet Take 1 tablet (10 mg total) by mouth daily. 90 tablet 3   betamethasone dipropionate 0.05 % cream Apply topically 2  (two) times daily. 45 g 5   betamethasone dipropionate 0.05 % lotion Apply 1 application topically to the scalp daily. Taper use as able. 120 mL 3   cetirizine (ZYRTEC) 10 MG tablet Take 1 tablet by mouth 2 (two) times a day.     Cholecalciferol (VITAMIN D3) 250 MCG (10000 UT) capsule Take 10,000 Units by mouth daily.     COVID-19 At Home Antigen Test [facility] COVID-19 HOME TEST) KIT use as directed 8 each 0   diclofenac (VOLTAREN) 25 MG EC tablet Take 75 mg by mouth 2 (two) times daily.     Fluocinolone Acetonide Scalp (DERMA-SMOOTHE/FS SCALP) 0.01 % OIL Apply TID 118.28 mL 5   Galcanezumab-gnlm (EMGALITY) 120 MG/ML SOSY Inject 120 mg into the skin every 30 (thirty) days. 3 mL 3   HYDROcodone-acetaminophen (NORCO) 5-325 MG tablet Take 1 tablet by mouth every 6 (six) hours as needed for moderate pain. 30 tablet 0   levothyroxine (SYNTHROID) 100 MCG tablet Take 1 tablet (100 mcg total) by mouth daily. 90 tablet 1   lidocaine (LIDODERM) 5 % Place 1 patch onto the skin daily. Remove & Discard patch within 12 hours or as directed by MD 30 patch 2   liothyronine (CYTOMEL) 5 MCG tablet Take 1 tablet (5 mcg total) by mouth in the morning, at noon, and at bedtime. 270 tablet 3   lisdexamfetamine (VYVANSE) 50 MG capsule Take 1 capsule (50 mg total) by mouth daily. 30 capsule 0   lisdexamfetamine (VYVANSE) 50 MG capsule Take 1 capsule (50 mg total) by mouth daily. 30 capsule 0   lisdexamfetamine (VYVANSE) 50 MG capsule Take 1 capsule (50 mg total) by mouth daily. 30 capsule 0   metaxalone (SKELAXIN) 800 MG tablet Take 1 tablet (800 mg total) by mouth 3 (three) times daily. 90 tablet 1   montelukast (SINGULAIR) 10 MG tablet Take 1 tablet (10 mg total) by mouth at bedtime. 90 tablet 3   omeprazole (PRILOSEC) 20 MG capsule Take 1 capsule (20 mg total) by mouth daily. 30 capsule 3   progesterone (PROMETRIUM) 100 MG capsule Take 1 capsule (100 mg total) by mouth daily. 90 capsule 0   promethazine (PHENERGAN)  25 MG tablet Take 1 tablet (25 mg total) by mouth every 6 (six) hours as needed for nausea or vomiting. 30 tablet 3   Rimegepant Sulfate ([name]) 75 MG TBDP Take 1 tablet by mouth daily as needed. 16 tablet 2   traMADol (ULTRAM) 50  MG tablet Take 2 tablets (100 mg total) by mouth 2 (two) times daily. 360 tablet 0   traZODone (DESYREL) 50 MG tablet Take 1-2 tablets (50-100 mg total) by mouth at bedtime as needed for sleep. 60 tablet 1   No current facility-administered medications on file prior to visit.     Allergies: Allergies  Allergen Reactions   Amoxicillin-Pot Clavulanate Nausea And Vomiting    Augmentin causes nausea and vomiting   Sertraline Other (See Comments)    hallucinations    Sulfamethoxazole-Trimethoprim Hives   Triptans Other (See Comments)    Chest pain     Family History: Family History  Problem Relation Age of Onset   Asthma Sister    Miscarriages / [place] Sister    Depression Sister    Psoriasis Sister    Cancer Mother    Depression Mother    Hashimoto's thyroiditis Mother    Diabetes Mother    Bipolar disorder Mother    Hypertension Father    Ankylosing spondylitis Father    Psoriasis Father    Psoriasis Brother    Migraines Brother    ADD / ADHD Brother    Eczema Brother    Allergies Brother    Psoriasis Maternal Aunt    Psoriasis Sister    Arthritis Sister        psoriatic arthritis    Psoriasis Sister    Rheum arthritis Sister    Depression Sister    Anxiety disorder Sister    Eczema Sister    Psoriasis Brother    ADD / ADHD Daughter    Eczema Daughter    Asthma Daughter     Past Medical History: Past Medical History:  Diagnosis Date   Allergy Age 71   Asthma Since age 76   Depression Age 48   Hashimoto's disease    Heart murmur Mitral valve prolapse age 89   Migraine    Psoriasis    Thyroid disease Since age 37    Past Surgical History Past Surgical History:  Procedure Laterality Date   BREAST SURGERY  Reduction age  51   CHOLECYSTECTOMY  Age 54   KNEE ARTHROSCOPY W/ LATERAL RELEASE Right    REDUCTION MAMMAPLASTY     WISDOM TOOTH EXTRACTION      Social History: Social History   Tobacco Use   Smoking status: Never   Smokeless tobacco: Never  Vaping Use   Vaping Use: Never used  Substance Use Topics   Alcohol use: Yes    Comment: Rarely   Drug use: Never    ROS: Negative for fevers, chills. Positive for headaches, numbness. All other systems reviewed and negative unless stated otherwise in HPI.   Physical Exam:   Vital Signs: BP (!) 139/92   Pulse 91   Ht _0  (1.702 m)   Wt 199 lb (90.3 kg)   LMP 10/31/2019   BMI 31.17 kg/m  GENERAL: well appearing,in no acute distress,alert SKIN:  Color, texture, turgor normal. No rashes or lesions HEAD:  Normocephalic/atraumatic. CV:  RRR RESP: Normal respiratory effort MSK: no tenderness to palpation over occiput, neck, or shoulders  NEUROLOGICAL: Mental Status: Alert, oriented to person, place and time,Follows commands Cranial Nerves: PERRL, visual fields intact to confrontation, extraocular movements intact, facial sensation intact, no facial droop or ptosis, hearing grossly intact, no dysarthria Motor: muscle strength 5/5 both upper and lower extremities Reflexes: brisk left patellar reflex, otherwise 2+ throughout Sensation: decreased sensation to light touch over left 4th and 5th  digits, otherwise intact to light touch throughout Coordination: Finger-to- nose-finger intact bilaterally Gait: normal-based   IMPRESSION: 52 year old female with a history of Hashimoto's disease who presents for evaluation of migraines. She also reports left upper extremity radicular symptoms and an episode of left foot numbness. Exam with decreased sensation in her left hand and brisk left patellar reflexes. MRI brain and C-spine ordered. Sample of [place] provided today to bridge her while awaiting insurance approval. Discussed starting Qulipta as an  alternative if Aimovig is denied. Ubrelvy sample provided for rescue. Will send in rx if this is effective.  PLAN: -MRI brain, C-spine -Prevention: Start Aimovig 140 mg monthly -Rescue: Ubrelvy sample provided, will send in rx if this is effective   I spent a total of 50 minutes chart reviewing and counseling the patient. Headache education was done. Discussed treatment options including preventive and acute medications. Discussed medication side effects, adverse reactions and drug interactions. Written educational materials and patient instructions outlining all of the above were given.  Follow-up: 3 months   Genia Harold, MD 10/30/2022   10:22 AM

## 2022-10-30 NOTE — Progress Notes (Signed)
Subjective:   Patient ID: Barbara Wood, female   DOB: 52 y.o.   MRN: [id number]   HPI Pain presents with pain fourth digit right foot states its been inflamed and hard to wear shoe gear with comfortably   ROS      Objective:  Physical Exam  Inflammatory capsulitis with fluid buildup around the inner phalangeal joint lateral side digit 4 right painful when pressed     Assessment:  Inflammatory capsulitis of joint surface fourth right     Plan:  H&P reviewed condition sterile prep injected the inner phalangeal joint 1.5 mg dexamethasone 5 mg Xylocaine debrided area applied cushioning reappoint as needed

## 2022-10-31 ENCOUNTER — Ambulatory Visit (INDEPENDENT_AMBULATORY_CARE_PROVIDER_SITE_OTHER): Payer: 59 | Admitting: Psychiatry

## 2022-10-31 DIAGNOSIS — F4310 Post-traumatic stress disorder, unspecified: Secondary | ICD-10-CM

## 2022-10-31 NOTE — Telephone Encounter (Signed)
Prior Auth for [employer] was approved. 10/25/22 - 04/23/23

## 2022-10-31 NOTE — Progress Notes (Signed)
      [facility] Counselor/Therapist Progress Note  Patient ID: Harriette Tovey, MRN: [id number],    Date: 10/31/2022  Time Spent:  51 minutes start time 1:01 PM end time 1:52 PM  Treatment Type: Individual Therapy  Reported Symptoms: migraines, anxiety, irritability, triggered responses  Mental Status Exam:  Appearance:   Well Groomed     Behavior:  Appropriate  Motor:  Normal  Speech/Language:   Normal Rate  Affect:  Appropriate  Mood:  anxious  Thought process:  normal  Thought content:    WNL  Sensory/Perceptual disturbances:    Headache   Orientation:  oriented to person, place, time/date, and situation  Attention:  Good  Concentration:  Good  Memory:  WNL  Fund of knowledge:   Good  Insight:    Good  Judgment:   Good  Impulse Control:  Good   Risk Assessment: Danger to Self:  No Self-injurious Behavior: No Danger to Others: No Duty to Warn:no Physical Aggression / Violence:No  Access to Firearms a concern: No  Gang Involvement:No   Subjective: Patient was present for session. She shared she is having migraines most of the time and is working with her neurologist on the issue.  She went on to share that she is having to increase her Abilify when her daughter is around due to all the behavior problems she is having.  Patient was able to share some of the situation with her daughter.  Had time to do some problem solving to try and figure out different ways that she can help her daughter in the situation.  Patient was encouraged to communicate with daughter's father about getting on the same page with them and making sure that #1 her medication is taken regularly as directed.  Also encouraged her and father to sit down and set expectations rules and goals for daughter.  Patient was also given questions to help orchestrate a communication with she and her daughter.  The importance of her taking care of herself during the situation was addressed as well.  Patient is also to work  on getting her MRI and taking care of her medical issues.  Interventions: Assertiveness/Communication, Solution-Oriented/Positive Psychology, and Family Systems  Diagnosis:   ICD-10-CM   1. PTSD (post-traumatic stress disorder)  F43.10       Plan: Patient is to use coping skills to decrease triggered responses.  Patient is to follow plans from session to help deal with the situation with her daughter appropriately.  Patient is to work on diaphragmatic breathing safe place exercise and stretching/yoga throughout the day.  Patient is to focus on what she can control fix and change.  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  Patient is to allow processing to continue between sessions .  Patient is to continue working on limit setting with daughter and ex-husband.  Patient is to release negative emotions through exercise  Long-term goal: Develop healthy boundaries Short-term goal: Resolve the trauma from past Give her daughter more stability Trying to navigate divorce and have good boundaries Deal with childhood trauma Figure out relationship with [name], [facility]

## 2022-11-01 ENCOUNTER — Encounter: Payer: Self-pay | Admitting: Psychiatry

## 2022-11-01 ENCOUNTER — Other Ambulatory Visit: Payer: Self-pay

## 2022-11-01 ENCOUNTER — Other Ambulatory Visit (HOSPITAL_COMMUNITY): Payer: Self-pay

## 2022-11-02 ENCOUNTER — Other Ambulatory Visit (HOSPITAL_COMMUNITY): Payer: Self-pay

## 2022-11-05 ENCOUNTER — Telehealth: Payer: Self-pay | Admitting: *Deleted

## 2022-11-05 ENCOUNTER — Other Ambulatory Visit (HOSPITAL_COMMUNITY): Payer: Self-pay

## 2022-11-05 NOTE — Telephone Encounter (Signed)
Prior Barbara Wood has been started for [name] Key: [id number]

## 2022-11-06 ENCOUNTER — Other Ambulatory Visit (HOSPITAL_COMMUNITY): Payer: Self-pay

## 2022-11-06 ENCOUNTER — Other Ambulatory Visit: Payer: Self-pay | Admitting: Internal Medicine

## 2022-11-06 DIAGNOSIS — E063 Autoimmune thyroiditis: Secondary | ICD-10-CM

## 2022-11-07 ENCOUNTER — Other Ambulatory Visit (HOSPITAL_COMMUNITY): Payer: Self-pay

## 2022-11-07 ENCOUNTER — Ambulatory Visit: Payer: 59

## 2022-11-07 DIAGNOSIS — R29818 Other symptoms and signs involving the nervous system: Secondary | ICD-10-CM

## 2022-11-07 DIAGNOSIS — R519 Headache, unspecified: Secondary | ICD-10-CM | POA: Diagnosis not present

## 2022-11-07 DIAGNOSIS — M5412 Radiculopathy, cervical region: Secondary | ICD-10-CM

## 2022-11-07 MED ORDER — GADOBENATE DIMEGLUMINE 529 MG/ML IV SOLN
20.0000 mL | Freq: Once | INTRAVENOUS | Status: AC | PRN
  Administered 2022-11-07: 20 mL via INTRAVENOUS

## 2022-11-07 MED ORDER — LEVOTHYROXINE SODIUM 100 MCG PO TABS
100.0000 ug | ORAL_TABLET | Freq: Every day | ORAL | 1 refills | Status: DC
  Filled 2022-11-07: qty 90, 90d supply, fill #0
  Filled 2023-02-11: qty 90, 90d supply, fill #1

## 2022-11-07 MED ORDER — NURTEC 75 MG PO TBDP: 1.0000 | ORAL_TABLET | Freq: Every day | ORAL | 0 refills | Status: DC | PRN

## 2022-11-08 ENCOUNTER — Other Ambulatory Visit: Payer: Self-pay | Admitting: Internal Medicine

## 2022-11-08 NOTE — Telephone Encounter (Signed)
Office notes faxed to appeals

## 2022-11-08 NOTE — Telephone Encounter (Signed)
PA for [name] was denied.  Would you like an appeal?

## 2022-11-08 NOTE — Telephone Encounter (Signed)
Spoke with someone at appeals [phone].  The PA was denied because the patient will be taking [name] with Emgality.

## 2022-11-12 DIAGNOSIS — M5412 Radiculopathy, cervical region: Secondary | ICD-10-CM | POA: Diagnosis not present

## 2022-11-12 NOTE — Telephone Encounter (Signed)
[employer] has received the appeal.  We will be notified in 15 days the determination.

## 2022-11-13 ENCOUNTER — Other Ambulatory Visit: Payer: Self-pay | Admitting: Neurological Surgery

## 2022-11-14 ENCOUNTER — Other Ambulatory Visit (HOSPITAL_COMMUNITY): Payer: Self-pay

## 2022-11-14 ENCOUNTER — Ambulatory Visit (INDEPENDENT_AMBULATORY_CARE_PROVIDER_SITE_OTHER): Payer: 59 | Admitting: Psychiatry

## 2022-11-14 DIAGNOSIS — F4310 Post-traumatic stress disorder, unspecified: Secondary | ICD-10-CM | POA: Diagnosis not present

## 2022-11-14 MED ORDER — LORAZEPAM 0.5 MG PO TABS
0.5000 mg | ORAL_TABLET | Freq: Every day | ORAL | 0 refills | Status: DC | PRN
  Filled 2022-11-14: qty 3, 3d supply, fill #0

## 2022-11-14 MED ORDER — BACLOFEN 5 MG PO TABS
5.0000 mg | ORAL_TABLET | Freq: Two times a day (BID) | ORAL | 2 refills | Status: DC | PRN
  Filled 2022-11-14: qty 20, 10d supply, fill #0

## 2022-11-14 NOTE — Progress Notes (Signed)
      [facility] Counselor/Therapist Progress Note  Patient ID: Randy Whitener, MRN: [id number],    Date: 11/14/2022  Time Spent: 50 minutes start time 2:07 PM end time 2:57 PM  Treatment Type: Individual Therapy  Reported Symptoms: anxiety, triggered responses, sadness, pain issues, migraines  Mental Status Exam:  Appearance:   Well Groomed     Behavior:  Appropriate  Motor:  Normal  Speech/Language:   Normal Rate  Affect:  Appropriate  Mood:  sad  Thought process:  normal  Thought content:    WNL  Sensory/Perceptual disturbances:    WNL  Orientation:  oriented to person, place, time/date, and situation  Attention:  Good  Concentration:  Good  Memory:  WNL  Fund of knowledge:   Good  Insight:    Good  Judgment:   Good  Impulse Control:  Good   Risk Assessment: Danger to Self:  No Self-injurious Behavior: No Danger to Others: No Duty to Warn:no Physical Aggression / Violence:No  Access to Firearms a concern: No  Gang Involvement:No   Subjective: Patient was present for session.  Patient shared how things had transpired with her daughter after session.  She is continuing to work on different strategies to manage her behavior appropriately.  Patient did say her father was finally working with patient on setting some appropriate limits.  She is going to having major neck surgery in 2 weeks.  Discussed the surgery and concerns that patient was having regarding the procedure.  Discussed the importance of her focusing on her self-care and thinking through ways that she can be comfortable being dependent upon other people.  Encouraged patient to allow her daughter to have some time with her but knows that she has to have different people taking care of her physical needs.  Patient was also encouraged to think through what she can do proactively to feel safe and that things are ready for her surgery.  Interventions: Solution-Oriented/Positive Psychology and  Insight-Oriented  Diagnosis:   ICD-10-CM   1. PTSD (post-traumatic stress disorder)  F43.10       Plan: Patient is to work on plans from session to prepare for her surgery Patient is to use coping skills to decrease triggered responses.  Patient is to work on diaphragmatic breathing safe place exercise and stretching/yoga throughout the day.  Patient is to focus on what she can control fix and change.  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  Patient is to allow processing to continue between sessions .  Patient is to continue working on limit setting with daughter and ex-husband.  Patient is to release negative emotions through exercise  Long-term goal: Develop healthy boundaries Short-term goal: Resolve the trauma from past Give her daughter more stability Trying to navigate divorce and have good boundaries Deal with childhood trauma Figure out relationship with [name], [facility]

## 2022-11-14 NOTE — Telephone Encounter (Signed)
PA was approved for [name], maximum of 12 pills, 11/14/22 - 11/14/23.

## 2022-11-15 ENCOUNTER — Telehealth: Payer: Self-pay | Admitting: Internal Medicine

## 2022-11-15 ENCOUNTER — Other Ambulatory Visit: Payer: Self-pay | Admitting: Internal Medicine

## 2022-11-15 ENCOUNTER — Encounter: Payer: Self-pay | Admitting: Internal Medicine

## 2022-11-15 ENCOUNTER — Other Ambulatory Visit (HOSPITAL_COMMUNITY): Payer: Self-pay

## 2022-11-15 DIAGNOSIS — G43909 Migraine, unspecified, not intractable, without status migrainosus: Secondary | ICD-10-CM

## 2022-11-15 MED ORDER — NURTEC 75 MG PO TBDP
1.0000 | ORAL_TABLET | Freq: Every day | ORAL | 2 refills | Status: DC | PRN
  Filled 2022-11-15: qty 16, 30d supply, fill #0
  Filled 2022-12-28: qty 16, 30d supply, fill #1
  Filled 2023-02-12 – 2023-02-22 (×2): qty 16, 30d supply, fill #2

## 2022-11-15 MED ORDER — HYDROCODONE-ACETAMINOPHEN 5-325 MG PO TABS
1.0000 | ORAL_TABLET | Freq: Four times a day (QID) | ORAL | 0 refills | Status: DC | PRN
  Filled 2022-11-15: qty 30, 8d supply, fill #0

## 2022-11-15 NOTE — Telephone Encounter (Signed)
Pt called to say that, as per the pharmacy, her prescription for the  Rimegepant Sulfate ([name]) 75 MG TBDP  Was sent somewhere else, but they cannot see where.   Pt is asking for a new prescription to be sent to:  [facility] Phone: [phone]  Fax: [phone]     There was a [facility] on [address] on Pt's pharmacy list, but Pt states she never uses that one, so it was deleted.   LOV:  09/11/22

## 2022-11-16 ENCOUNTER — Other Ambulatory Visit (HOSPITAL_COMMUNITY): Payer: Self-pay

## 2022-11-26 ENCOUNTER — Other Ambulatory Visit: Payer: Self-pay

## 2022-11-26 ENCOUNTER — Other Ambulatory Visit: Payer: Self-pay | Admitting: Internal Medicine

## 2022-11-26 ENCOUNTER — Encounter (HOSPITAL_COMMUNITY): Payer: Self-pay | Admitting: Neurological Surgery

## 2022-11-26 ENCOUNTER — Other Ambulatory Visit (HOSPITAL_COMMUNITY): Payer: Self-pay

## 2022-11-26 ENCOUNTER — Other Ambulatory Visit: Payer: Self-pay | Admitting: Neurological Surgery

## 2022-11-26 DIAGNOSIS — G43909 Migraine, unspecified, not intractable, without status migrainosus: Secondary | ICD-10-CM

## 2022-11-26 DIAGNOSIS — F988 Other specified behavioral and emotional disorders with onset usually occurring in childhood and adolescence: Secondary | ICD-10-CM

## 2022-11-26 MED ORDER — ALBUTEROL SULFATE HFA 108 (90 BASE) MCG/ACT IN AERS
2.0000 | INHALATION_SPRAY | Freq: Four times a day (QID) | RESPIRATORY_TRACT | 1 refills | Status: DC | PRN
  Filled 2022-11-26: qty 6.7, 25d supply, fill #0

## 2022-11-26 MED ORDER — LISDEXAMFETAMINE DIMESYLATE 50 MG PO CAPS
50.0000 mg | ORAL_CAPSULE | Freq: Every day | ORAL | 0 refills | Status: DC
  Filled 2022-11-26 – 2022-11-27 (×3): qty 30, 30d supply, fill #0
  Filled ????-??-??: fill #0

## 2022-11-26 NOTE — Progress Notes (Signed)
Completed pre-op phone call with patient. Instructions given. Pt denies any current cardiac concerns.

## 2022-11-27 ENCOUNTER — Ambulatory Visit (HOSPITAL_COMMUNITY): Payer: 59

## 2022-11-27 ENCOUNTER — Other Ambulatory Visit: Payer: Self-pay

## 2022-11-27 ENCOUNTER — Encounter (HOSPITAL_COMMUNITY)
Admission: RE | Disposition: A | Discharge status: Home / Self Care | Payer: Self-pay | Source: Home / Self Care | Attending: Neurological Surgery

## 2022-11-27 ENCOUNTER — Other Ambulatory Visit (HOSPITAL_COMMUNITY): Payer: Self-pay

## 2022-11-27 ENCOUNTER — Ambulatory Visit (HOSPITAL_COMMUNITY): Payer: 59 | Admitting: Certified Registered"

## 2022-11-27 ENCOUNTER — Observation Stay (HOSPITAL_COMMUNITY)
Admission: RE | Admit: 2022-11-27 | Discharge: 2022-11-28 | Disposition: A | Discharge status: Home / Self Care | Payer: 59 | Attending: Neurological Surgery | Admitting: Neurological Surgery

## 2022-11-27 ENCOUNTER — Encounter (HOSPITAL_COMMUNITY): Payer: Self-pay | Admitting: Neurological Surgery

## 2022-11-27 ENCOUNTER — Other Ambulatory Visit: Payer: Self-pay | Admitting: Neurological Surgery

## 2022-11-27 ENCOUNTER — Ambulatory Visit (HOSPITAL_BASED_OUTPATIENT_CLINIC_OR_DEPARTMENT_OTHER): Payer: 59 | Admitting: Certified Registered"

## 2022-11-27 DIAGNOSIS — J45909 Unspecified asthma, uncomplicated: Secondary | ICD-10-CM | POA: Insufficient documentation

## 2022-11-27 DIAGNOSIS — M4712 Other spondylosis with myelopathy, cervical region: Secondary | ICD-10-CM | POA: Insufficient documentation

## 2022-11-27 DIAGNOSIS — M4322 Fusion of spine, cervical region: Secondary | ICD-10-CM | POA: Diagnosis not present

## 2022-11-27 DIAGNOSIS — M4722 Other spondylosis with radiculopathy, cervical region: Secondary | ICD-10-CM | POA: Diagnosis not present

## 2022-11-27 DIAGNOSIS — M4802 Spinal stenosis, cervical region: Secondary | ICD-10-CM | POA: Diagnosis not present

## 2022-11-27 DIAGNOSIS — G959 Disease of spinal cord, unspecified: Secondary | ICD-10-CM | POA: Diagnosis present

## 2022-11-27 DIAGNOSIS — M5412 Radiculopathy, cervical region: Secondary | ICD-10-CM | POA: Diagnosis not present

## 2022-11-27 HISTORY — DX: Other specified postprocedural states: Z98.890

## 2022-11-27 HISTORY — DX: Other specified postprocedural states: R11.2

## 2022-11-27 HISTORY — DX: Unspecified osteoarthritis, unspecified site: M19.90

## 2022-11-27 HISTORY — PX: CERVICAL DISC ARTHROPLASTY: SHX587

## 2022-11-27 HISTORY — DX: Prediabetes: R73.03

## 2022-11-27 HISTORY — DX: Gastro-esophageal reflux disease without esophagitis: K21.9

## 2022-11-27 HISTORY — DX: Family history of other specified conditions: Z84.89

## 2022-11-27 LAB — CBC
HCT: 39 % (ref 36.0–46.0)
Hemoglobin: 11.8 g/dL — ABNORMAL LOW (ref 12.0–15.0)
MCH: 26.9 pg (ref 26.0–34.0)
MCHC: 30.3 g/dL (ref 30.0–36.0)
MCV: 89 fL (ref 80.0–100.0)
Platelets: 258 10*3/uL (ref 150–400)
RBC: 4.38 MIL/uL (ref 3.87–5.11)
RDW: 13.5 % (ref 11.5–15.5)
WBC: 7.4 10*3/uL (ref 4.0–10.5)
nRBC: 0 % (ref 0.0–0.2)

## 2022-11-27 LAB — BASIC METABOLIC PANEL
Anion gap: 11 (ref 5–15)
BUN: 13 mg/dL (ref 6–20)
CO2: 27 mmol/L (ref 22–32)
Calcium: 9.4 mg/dL (ref 8.9–10.3)
Chloride: 104 mmol/L (ref 98–111)
Creatinine, Ser: 0.81 mg/dL (ref 0.44–1.00)
GFR, Estimated: >60 mL/min (ref >60)
Glucose, Bld: 100 mg/dL — ABNORMAL HIGH (ref 70–99)
Potassium: 4.1 mmol/L (ref 3.5–5.1)
Sodium: 142 mmol/L (ref 135–145)

## 2022-11-27 LAB — SURGICAL PCR SCREEN
MRSA, PCR: NEGATIVE
Staphylococcus aureus: POSITIVE — AB

## 2022-11-27 SURGERY — CERVICAL ANTERIOR DISC ARTHROPLASTY
Anesthesia: General

## 2022-11-27 MED ORDER — MENTHOL 3 MG MT LOZG: 1.0000 | LOZENGE | OROMUCOSAL | Status: DC | PRN

## 2022-11-27 MED ORDER — TRIAMCINOLONE ACETONIDE 40 MG/ML IJ SUSP
INTRAMUSCULAR | Status: AC
  Filled 2022-11-27: qty 5

## 2022-11-27 MED ORDER — MUPIROCIN 2 % EX OINT
1.0000 | TOPICAL_OINTMENT | Freq: Two times a day (BID) | CUTANEOUS | Status: DC
  Administered 2022-11-27: 1 via NASAL
  Filled 2022-11-27: qty 22

## 2022-11-27 MED ORDER — OXYCODONE HCL 5 MG PO TABS
ORAL_TABLET | ORAL | Status: AC
  Filled 2022-11-27: qty 1

## 2022-11-27 MED ORDER — CHLORHEXIDINE GLUCONATE CLOTH 2 % EX PADS: 6.0000 | MEDICATED_PAD | Freq: Once | CUTANEOUS | Status: DC

## 2022-11-27 MED ORDER — ARIPIPRAZOLE 10 MG PO TABS
10.0000 mg | ORAL_TABLET | Freq: Every day | ORAL | Status: DC
  Administered 2022-11-27: 10 mg via ORAL
  Filled 2022-11-27 (×2): qty 1

## 2022-11-27 MED ORDER — KETOROLAC TROMETHAMINE 15 MG/ML IJ SOLN
15.0000 mg | Freq: Four times a day (QID) | INTRAMUSCULAR | Status: DC
  Administered 2022-11-27 – 2022-11-28 (×2): 15 mg via INTRAVENOUS
  Filled 2022-11-27 (×2): qty 1

## 2022-11-27 MED ORDER — LISDEXAMFETAMINE DIMESYLATE 50 MG PO CAPS: 50.0000 mg | ORAL_CAPSULE | Freq: Every day | ORAL | Status: DC

## 2022-11-27 MED ORDER — PROGESTERONE MICRONIZED 100 MG PO CAPS
100.0000 mg | ORAL_CAPSULE | Freq: Every day | ORAL | Status: DC
  Administered 2022-11-27: 100 mg via ORAL
  Filled 2022-11-27 (×2): qty 1

## 2022-11-27 MED ORDER — LIDOCAINE 5 % EX PTCH
2.0000 | MEDICATED_PATCH | CUTANEOUS | Status: DC
  Administered 2022-11-27: 2 via TRANSDERMAL
  Filled 2022-11-27 (×2): qty 2

## 2022-11-27 MED ORDER — ALBUTEROL SULFATE HFA 108 (90 BASE) MCG/ACT IN AERS: 2.0000 | INHALATION_SPRAY | Freq: Four times a day (QID) | RESPIRATORY_TRACT | Status: DC | PRN

## 2022-11-27 MED ORDER — HYDROMORPHONE HCL 1 MG/ML IJ SOLN: 0.5000 mg | INTRAMUSCULAR | Status: DC | PRN

## 2022-11-27 MED ORDER — ONDANSETRON HCL 4 MG/2ML IJ SOLN
INTRAMUSCULAR | Status: DC | PRN
  Administered 2022-11-27: 4 mg via INTRAVENOUS

## 2022-11-27 MED ORDER — SODIUM CHLORIDE 0.9% FLUSH: 3.0000 mL | Freq: Two times a day (BID) | INTRAVENOUS | Status: DC

## 2022-11-27 MED ORDER — ARTIFICIAL TEARS OPHTHALMIC OINT
TOPICAL_OINTMENT | OPHTHALMIC | Status: AC
  Filled 2022-11-27: qty 3.5

## 2022-11-27 MED ORDER — DIPHENHYDRAMINE HCL 50 MG/ML IJ SOLN
INTRAMUSCULAR | Status: AC
  Filled 2022-11-27: qty 1

## 2022-11-27 MED ORDER — SUGAMMADEX SODIUM 200 MG/2ML IV SOLN
INTRAVENOUS | Status: DC | PRN
  Administered 2022-11-27: 200 mg via INTRAVENOUS

## 2022-11-27 MED ORDER — BACLOFEN 5 MG HALF TABLET
5.0000 mg | ORAL_TABLET | Freq: Three times a day (TID) | ORAL | Status: DC | PRN
  Administered 2022-11-27 – 2022-11-28 (×2): 5 mg via ORAL
  Filled 2022-11-27 (×3): qty 1

## 2022-11-27 MED ORDER — ACETAMINOPHEN 500 MG PO TABS
1000.0000 mg | ORAL_TABLET | Freq: Once | ORAL | Status: AC
  Administered 2022-11-27: 1000 mg via ORAL

## 2022-11-27 MED ORDER — LIDOCAINE 2% (20 MG/ML) 5 ML SYRINGE
INTRAMUSCULAR | Status: DC | PRN
  Administered 2022-11-27: 60 mg via INTRAVENOUS

## 2022-11-27 MED ORDER — GLYCOPYRROLATE PF 0.2 MG/ML IJ SOSY
PREFILLED_SYRINGE | INTRAMUSCULAR | Status: DC | PRN
  Administered 2022-11-27: .2 mg via INTRAVENOUS

## 2022-11-27 MED ORDER — VANCOMYCIN HCL IN DEXTROSE 1-5 GM/200ML-% IV SOLN: 1000.0000 mg | INTRAVENOUS | Status: DC

## 2022-11-27 MED ORDER — GLYCOPYRROLATE PF 0.2 MG/ML IJ SOSY
PREFILLED_SYRINGE | INTRAMUSCULAR | Status: AC
  Filled 2022-11-27: qty 1

## 2022-11-27 MED ORDER — METAXALONE 800 MG PO TABS
800.0000 mg | ORAL_TABLET | Freq: Three times a day (TID) | ORAL | Status: DC
  Administered 2022-11-27: 800 mg via ORAL
  Filled 2022-11-27 (×2): qty 1

## 2022-11-27 MED ORDER — HEMOSTATIC AGENTS (NO CHARGE) OPTIME
TOPICAL | Status: DC | PRN
  Administered 2022-11-27: 1 via TOPICAL

## 2022-11-27 MED ORDER — LORATADINE 10 MG PO TABS
10.0000 mg | ORAL_TABLET | Freq: Every day | ORAL | Status: DC
  Filled 2022-11-27: qty 1

## 2022-11-27 MED ORDER — PROPOFOL 10 MG/ML IV BOLUS
INTRAVENOUS | Status: DC | PRN
  Administered 2022-11-27: 150 mg via INTRAVENOUS

## 2022-11-27 MED ORDER — PROMETHAZINE HCL 25 MG/ML IJ SOLN
INTRAMUSCULAR | Status: AC
  Filled 2022-11-27: qty 1

## 2022-11-27 MED ORDER — EPHEDRINE SULFATE-NACL 50-0.9 MG/10ML-% IV SOSY
PREFILLED_SYRINGE | INTRAVENOUS | Status: DC | PRN
  Administered 2022-11-27: 10 mg via INTRAVENOUS
  Administered 2022-11-27 (×2): 5 mg via INTRAVENOUS

## 2022-11-27 MED ORDER — SODIUM CHLORIDE 0.9% FLUSH: 3.0000 mL | INTRAVENOUS | Status: DC | PRN

## 2022-11-27 MED ORDER — ACETAMINOPHEN 325 MG PO TABS
650.0000 mg | ORAL_TABLET | ORAL | Status: DC | PRN
  Filled 2022-11-27: qty 2

## 2022-11-27 MED ORDER — SODIUM CHLORIDE 0.9 % IV SOLN: 250.0000 mL | INTRAVENOUS | Status: DC

## 2022-11-27 MED ORDER — ONDANSETRON HCL 4 MG/2ML IJ SOLN
INTRAMUSCULAR | Status: AC
  Filled 2022-11-27: qty 2

## 2022-11-27 MED ORDER — PROMETHAZINE HCL 25 MG/ML IJ SOLN: 6.2500 mg | INTRAMUSCULAR | Status: DC | PRN

## 2022-11-27 MED ORDER — MIDAZOLAM HCL 2 MG/2ML IJ SOLN
INTRAMUSCULAR | Status: AC
  Filled 2022-11-27: qty 2

## 2022-11-27 MED ORDER — THROMBIN 5000 UNITS EX SOLR
OROMUCOSAL | Status: DC | PRN
  Administered 2022-11-27: 5 mL via TOPICAL

## 2022-11-27 MED ORDER — RIMEGEPANT SULFATE 75 MG PO TBDP: 75.0000 | ORAL_TABLET | Freq: Every day | ORAL | Status: DC | PRN

## 2022-11-27 MED ORDER — ONDANSETRON HCL 4 MG PO TABS: 4.0000 mg | ORAL_TABLET | Freq: Four times a day (QID) | ORAL | Status: DC | PRN

## 2022-11-27 MED ORDER — OXYCODONE HCL 5 MG PO TABS
10.0000 mg | ORAL_TABLET | ORAL | Status: DC | PRN
  Filled 2022-11-27: qty 2

## 2022-11-27 MED ORDER — CEFAZOLIN SODIUM-DEXTROSE 2-4 GM/100ML-% IV SOLN
2.0000 g | Freq: Once | INTRAVENOUS | Status: AC
  Administered 2022-11-27: 2 g via INTRAVENOUS
  Filled 2022-11-27: qty 100

## 2022-11-27 MED ORDER — PROMETHAZINE HCL 25 MG PO TABS: 25.0000 mg | ORAL_TABLET | Freq: Four times a day (QID) | ORAL | Status: DC | PRN

## 2022-11-27 MED ORDER — ROCURONIUM BROMIDE 10 MG/ML (PF) SYRINGE
PREFILLED_SYRINGE | INTRAVENOUS | Status: DC | PRN
  Administered 2022-11-27: 20 mg via INTRAVENOUS
  Administered 2022-11-27: 50 mg via INTRAVENOUS
  Administered 2022-11-27: 20 mg via INTRAVENOUS

## 2022-11-27 MED ORDER — OXYCODONE HCL 5 MG PO TABS
5.0000 mg | ORAL_TABLET | Freq: Once | ORAL | Status: AC | PRN
  Administered 2022-11-27: 5 mg via ORAL

## 2022-11-27 MED ORDER — LIDOCAINE 2% (20 MG/ML) 5 ML SYRINGE
INTRAMUSCULAR | Status: AC
  Filled 2022-11-27: qty 5

## 2022-11-27 MED ORDER — PHENYLEPHRINE HCL-NACL 20-0.9 MG/250ML-% IV SOLN
INTRAVENOUS | Status: DC | PRN
  Administered 2022-11-27: 40 ug/min via INTRAVENOUS

## 2022-11-27 MED ORDER — OXYCODONE HCL 5 MG PO TABS: 5.0000 mg | ORAL_TABLET | Freq: Once | ORAL | Status: DC | PRN

## 2022-11-27 MED ORDER — DIPHENHYDRAMINE HCL 50 MG/ML IJ SOLN
INTRAMUSCULAR | Status: DC | PRN
  Administered 2022-11-27: 12.5 mg via INTRAVENOUS

## 2022-11-27 MED ORDER — PROMETHAZINE HCL 25 MG/ML IJ SOLN
6.2500 mg | INTRAMUSCULAR | Status: DC | PRN
  Administered 2022-11-27: 12.5 mg via INTRAVENOUS

## 2022-11-27 MED ORDER — LIOTHYRONINE SODIUM 5 MCG PO TABS
5.0000 ug | ORAL_TABLET | Freq: Three times a day (TID) | ORAL | Status: DC
  Administered 2022-11-27: 5 ug via ORAL
  Filled 2022-11-27 (×2): qty 1

## 2022-11-27 MED ORDER — OXYCODONE HCL 5 MG/5ML PO SOLN: 5.0000 mg | Freq: Once | ORAL | Status: AC | PRN

## 2022-11-27 MED ORDER — FENTANYL CITRATE (PF) 100 MCG/2ML IJ SOLN: 25.0000 ug | INTRAMUSCULAR | Status: DC | PRN

## 2022-11-27 MED ORDER — CEFAZOLIN SODIUM-DEXTROSE 2-4 GM/100ML-% IV SOLN
2.0000 g | Freq: Three times a day (TID) | INTRAVENOUS | Status: AC
  Administered 2022-11-27 – 2022-11-28 (×2): 2 g via INTRAVENOUS
  Filled 2022-11-27 (×2): qty 100

## 2022-11-27 MED ORDER — MOMETASONE FURO-FORMOTEROL FUM 100-5 MCG/ACT IN AERO
2.0000 | INHALATION_SPRAY | Freq: Two times a day (BID) | RESPIRATORY_TRACT | Status: DC
  Administered 2022-11-27: 2 via RESPIRATORY_TRACT
  Filled 2022-11-27: qty 8.8

## 2022-11-27 MED ORDER — ACETAMINOPHEN 650 MG RE SUPP: 650.0000 mg | RECTAL | Status: DC | PRN

## 2022-11-27 MED ORDER — DEXAMETHASONE SODIUM PHOSPHATE 10 MG/ML IJ SOLN
INTRAMUSCULAR | Status: AC
  Filled 2022-11-27: qty 1

## 2022-11-27 MED ORDER — LEVOTHYROXINE SODIUM 100 MCG PO TABS
100.0000 ug | ORAL_TABLET | Freq: Every day | ORAL | Status: DC
  Administered 2022-11-28: 100 ug via ORAL
  Filled 2022-11-27: qty 1

## 2022-11-27 MED ORDER — ROCURONIUM BROMIDE 10 MG/ML (PF) SYRINGE
PREFILLED_SYRINGE | INTRAVENOUS | Status: AC
  Filled 2022-11-27: qty 10

## 2022-11-27 MED ORDER — PHENYLEPHRINE 80 MCG/ML (10ML) SYRINGE FOR IV PUSH (FOR BLOOD PRESSURE SUPPORT)
PREFILLED_SYRINGE | INTRAVENOUS | Status: AC
  Filled 2022-11-27: qty 20

## 2022-11-27 MED ORDER — OXYCODONE HCL 5 MG PO TABS
5.0000 mg | ORAL_TABLET | ORAL | Status: DC | PRN
  Administered 2022-11-27 – 2022-11-28 (×2): 5 mg via ORAL
  Filled 2022-11-27 (×2): qty 1

## 2022-11-27 MED ORDER — FENTANYL CITRATE (PF) 100 MCG/2ML IJ SOLN
INTRAMUSCULAR | Status: AC
  Filled 2022-11-27: qty 2

## 2022-11-27 MED ORDER — PHENYLEPHRINE 80 MCG/ML (10ML) SYRINGE FOR IV PUSH (FOR BLOOD PRESSURE SUPPORT)
PREFILLED_SYRINGE | INTRAVENOUS | Status: DC | PRN
  Administered 2022-11-27: 160 ug via INTRAVENOUS
  Administered 2022-11-27: 240 ug via INTRAVENOUS
  Administered 2022-11-27: 80 ug via INTRAVENOUS
  Administered 2022-11-27: 160 ug via INTRAVENOUS

## 2022-11-27 MED ORDER — FENTANYL CITRATE (PF) 250 MCG/5ML IJ SOLN
INTRAMUSCULAR | Status: DC | PRN
  Administered 2022-11-27: 100 ug via INTRAVENOUS
  Administered 2022-11-27 (×2): 50 ug via INTRAVENOUS

## 2022-11-27 MED ORDER — DEXAMETHASONE SODIUM PHOSPHATE 10 MG/ML IJ SOLN
INTRAMUSCULAR | Status: DC | PRN
  Administered 2022-11-27: 10 mg via INTRAVENOUS

## 2022-11-27 MED ORDER — THROMBIN 5000 UNITS EX SOLR
CUTANEOUS | Status: AC
  Filled 2022-11-27: qty 5000

## 2022-11-27 MED ORDER — ALBUTEROL SULFATE (2.5 MG/3ML) 0.083% IN NEBU: 2.5000 mg | INHALATION_SOLUTION | Freq: Four times a day (QID) | RESPIRATORY_TRACT | Status: DC | PRN

## 2022-11-27 MED ORDER — ATENOLOL 25 MG PO TABS: 25.0000 mg | ORAL_TABLET | Freq: Every day | ORAL | Status: DC

## 2022-11-27 MED ORDER — EPHEDRINE 5 MG/ML INJ
INTRAVENOUS | Status: AC
  Filled 2022-11-27: qty 5

## 2022-11-27 MED ORDER — ACETAMINOPHEN 500 MG PO TABS
ORAL_TABLET | ORAL | Status: AC
  Filled 2022-11-27: qty 2

## 2022-11-27 MED ORDER — PROPOFOL 500 MG/50ML IV EMUL
INTRAVENOUS | Status: DC | PRN
  Administered 2022-11-27: 150 ug/kg/min via INTRAVENOUS
  Administered 2022-11-27: 100 ug/kg/min via INTRAVENOUS

## 2022-11-27 MED ORDER — LACTATED RINGERS IV SOLN: INTRAVENOUS | Status: DC

## 2022-11-27 MED ORDER — CHLORHEXIDINE GLUCONATE 0.12 % MT SOLN
15.0000 mL | Freq: Once | OROMUCOSAL | Status: AC
  Administered 2022-11-27: 15 mL via OROMUCOSAL
  Filled 2022-11-27: qty 15

## 2022-11-27 MED ORDER — ONDANSETRON HCL 4 MG/2ML IJ SOLN: 4.0000 mg | Freq: Four times a day (QID) | INTRAMUSCULAR | Status: DC | PRN

## 2022-11-27 MED ORDER — PROPOFOL 10 MG/ML IV BOLUS
INTRAVENOUS | Status: AC
  Filled 2022-11-27: qty 20

## 2022-11-27 MED ORDER — PHENOL 1.4 % MT LIQD: 1.0000 | OROMUCOSAL | Status: DC | PRN

## 2022-11-27 MED ORDER — FENTANYL CITRATE (PF) 250 MCG/5ML IJ SOLN
INTRAMUSCULAR | Status: AC
  Filled 2022-11-27: qty 5

## 2022-11-27 MED ORDER — OXYCODONE HCL 5 MG/5ML PO SOLN: 5.0000 mg | Freq: Once | ORAL | Status: DC | PRN

## 2022-11-27 MED ORDER — TRIAMCINOLONE ACETONIDE 40 MG/ML IJ SUSP
INTRAMUSCULAR | Status: DC | PRN
  Administered 2022-11-27: 40 mg via INTRAMUSCULAR

## 2022-11-27 MED ORDER — MIDAZOLAM HCL 2 MG/2ML IJ SOLN
INTRAMUSCULAR | Status: DC | PRN
  Administered 2022-11-27 (×2): 2 mg via INTRAVENOUS

## 2022-11-27 MED ORDER — MONTELUKAST SODIUM 10 MG PO TABS
10.0000 mg | ORAL_TABLET | Freq: Every day | ORAL | Status: DC
  Administered 2022-11-27: 10 mg via ORAL
  Filled 2022-11-27: qty 1

## 2022-11-27 MED ORDER — 0.9 % SODIUM CHLORIDE (POUR BTL) OPTIME
TOPICAL | Status: DC | PRN
  Administered 2022-11-27: 1000 mL

## 2022-11-27 MED ORDER — PROMETHAZINE HCL 25 MG PO TABS
25.0000 mg | ORAL_TABLET | Freq: Four times a day (QID) | ORAL | 3 refills | Status: DC | PRN
  Filled 2022-11-27: qty 30, 8d supply, fill #0

## 2022-11-27 MED ORDER — CHLORHEXIDINE GLUCONATE CLOTH 2 % EX PADS: 6.0000 | MEDICATED_PAD | Freq: Every day | CUTANEOUS | Status: DC

## 2022-11-27 MED ORDER — DOCUSATE SODIUM 100 MG PO CAPS
100.0000 mg | ORAL_CAPSULE | Freq: Two times a day (BID) | ORAL | Status: DC
  Administered 2022-11-27: 100 mg via ORAL
  Filled 2022-11-27: qty 1

## 2022-11-27 MED ORDER — ORAL CARE MOUTH RINSE: 15.0000 mL | Freq: Once | OROMUCOSAL | Status: AC

## 2022-11-27 MED ORDER — TRAZODONE HCL 150 MG PO TABS: 75.0000 mg | ORAL_TABLET | Freq: Every evening | ORAL | Status: DC | PRN

## 2022-11-27 MED ORDER — PANTOPRAZOLE SODIUM 40 MG PO TBEC: 40.0000 mg | DELAYED_RELEASE_TABLET | Freq: Every day | ORAL | Status: DC

## 2022-11-27 MED ORDER — PHENYLEPHRINE 80 MCG/ML (10ML) SYRINGE FOR IV PUSH (FOR BLOOD PRESSURE SUPPORT)
PREFILLED_SYRINGE | INTRAVENOUS | Status: AC
  Filled 2022-11-27: qty 10

## 2022-11-27 SURGICAL SUPPLY — 56 items
BAG COUNTER SPONGE SURGICOUNT (BAG) ×1 IMPLANT
BAND RUBBER #18 3X1/16 STRL (MISCELLANEOUS) ×2 IMPLANT
BENZOIN TINCTURE PRP APPL 2/3 (GAUZE/BANDAGES/DRESSINGS) IMPLANT
BIT DRILL FLUTELESS (BIT) IMPLANT
BIT DRILL NEURO 2X3.1 SFT TUCH (MISCELLANEOUS) ×1 IMPLANT
BLADE CLIPPER SURG (BLADE) IMPLANT
BUR CARBIDE MATCH 3.0 (BURR) ×1 IMPLANT
CAGE PRESTIGE LP 5X14 (Cage) IMPLANT
CANISTER SUCT 3000ML PPV (MISCELLANEOUS) ×1 IMPLANT
COVER MAYO STAND STRL (DRAPES) ×2 IMPLANT
DERMABOND ADVANCED .7 DNX12 (GAUZE/BANDAGES/DRESSINGS) IMPLANT
DRAPE C-ARM 42X120 X-RAY (DRAPES) IMPLANT
DRAPE C-ARM 42X72 X-RAY (DRAPES) ×1 IMPLANT
DRAPE C-ARMOR (DRAPES) IMPLANT
DRAPE HALF SHEET 40X57 (DRAPES) IMPLANT
DRAPE LAPAROTOMY 100X72X124 (DRAPES) ×1 IMPLANT
DRAPE MICROSCOPE SLANT 54X150 (MISCELLANEOUS) ×1 IMPLANT
DRILL NEURO 2X3.1 SOFT TOUCH (MISCELLANEOUS) ×1
DRSG OPSITE POSTOP 3X4 (GAUZE/BANDAGES/DRESSINGS) IMPLANT
DURAPREP 6ML APPLICATOR 50/CS (WOUND CARE) ×1 IMPLANT
ELECT COATED BLADE 2.86 ST (ELECTRODE) ×1 IMPLANT
ELECT REM PT RETURN 9FT ADLT (ELECTROSURGICAL) ×1
ELECTRODE REM PT RTRN 9FT ADLT (ELECTROSURGICAL) ×1 IMPLANT
EVACUATOR 1/8 PVC DRAIN (DRAIN) IMPLANT
GAUZE 4X4 16PLY ~~LOC~~+RFID DBL (SPONGE) IMPLANT
GLOVE BIOGEL PI IND STRL 8 (GLOVE) ×1 IMPLANT
GLOVE ECLIPSE 8.0 STRL XLNG CF (GLOVE) ×2 IMPLANT
GLOVE EXAM NITRILE LRG STRL (GLOVE) IMPLANT
GLOVE EXAM NITRILE XL STR (GLOVE) IMPLANT
GLOVE EXAM NITRILE XS STR PU (GLOVE) IMPLANT
GLOVE SURG ENC MOIS LTX SZ8 (GLOVE) ×1 IMPLANT
GLOVE SURG UNDER POLY LF SZ8.5 (GLOVE) ×1 IMPLANT
GOWN STRL REUS W/ TWL LRG LVL3 (GOWN DISPOSABLE) IMPLANT
GOWN STRL REUS W/ TWL XL LVL3 (GOWN DISPOSABLE) ×2 IMPLANT
GOWN STRL REUS W/TWL 2XL LVL3 (GOWN DISPOSABLE) IMPLANT
GOWN STRL REUS W/TWL LRG LVL3 (GOWN DISPOSABLE) ×1
GOWN STRL REUS W/TWL XL LVL3 (GOWN DISPOSABLE) ×3
HEMOSTAT POWDER KIT SURGIFOAM (HEMOSTASIS) ×1 IMPLANT
KIT BASIN OR (CUSTOM PROCEDURE TRAY) ×1 IMPLANT
KIT TURNOVER KIT B (KITS) ×1 IMPLANT
NDL SPNL 18GX3.5 QUINCKE PK (NEEDLE) ×1 IMPLANT
NEEDLE HYPO 22GX1.5 SAFETY (NEEDLE) ×1 IMPLANT
NEEDLE SPNL 18GX3.5 QUINCKE PK (NEEDLE) ×1 IMPLANT
NS IRRIG 1000ML POUR BTL (IV SOLUTION) ×1 IMPLANT
PACK LAMINECTOMY NEURO (CUSTOM PROCEDURE TRAY) ×1 IMPLANT
PAD ARMBOARD 7.5X6 YLW CONV (MISCELLANEOUS) ×3 IMPLANT
PIN DISTRACTION 14MM (PIN) ×2 IMPLANT
SPONGE INTESTINAL PEANUT (DISPOSABLE) IMPLANT
SPONGE SURGIFOAM ABS GEL SZ50 (HEMOSTASIS) ×1 IMPLANT
STAPLER VISISTAT 35W (STAPLE) IMPLANT
STRIP CLOSURE SKIN 1/2X4 (GAUZE/BANDAGES/DRESSINGS) ×1 IMPLANT
TAPE SURG TRANSPORE 1 IN (GAUZE/BANDAGES/DRESSINGS) ×1 IMPLANT
TAPE SURGICAL TRANSPORE 1 IN (GAUZE/BANDAGES/DRESSINGS) ×1
TOWEL GREEN STERILE (TOWEL DISPOSABLE) ×1 IMPLANT
TOWEL GREEN STERILE FF (TOWEL DISPOSABLE) ×1 IMPLANT
WATER STERILE IRR 1000ML POUR (IV SOLUTION) ×1 IMPLANT

## 2022-11-27 NOTE — Op Note (Signed)
Providing Compassionate, Quality Care - Together  Date of service: 11/27/2022  PREOP DIAGNOSIS: Cervical spondylosis, stenosis with myeloradiculopathy, C5-6  POSTOP DIAGNOSIS: Same  PROCEDURE: 1.  Total disc arthroplasty, C5-6 2. Placement of intervertebral biomechanical device Medtronic Prestige 5 x 14 mm arthroplasty device 3. Discectomy at C5-6 for decompression of spinal cord and exiting nerve roots  4. Use of intraoperative microscope  SURGEON: Dr. Elwin Sleight, DO  ASSISTANT: Dr. Emelda Brothers, MD  ANESTHESIA: General Endotracheal  EBL: 20 cc  SPECIMENS: None  DRAINS: None  COMPLICATIONS: None immediate  CONDITION: Hemodynamically stable to PACU  HISTORY: Barbara Wood is a 52 y.o. y.o. female who initially presented to the outpatient clinic with signs and symptoms consistent with myeloradiculopathy. MRI demonstrated severe cervical stenosis with cord compression and deformation at C5-6 eccentric to the left with severe foraminal stenosis on the left.she had worsening left upper extremity radiculopathy with headaches and difficulty with hyperreflexia and fine motor movement changes.  Treatment options were discussed including ACDF, total disc arthroplasty, epidural steroid injections, pain control.  I recommended total disc arthroplasty given the minimal amount of spondylosis as well as her wishes to avoid adjacent segment deterioration. After all questions were answered, informed consent was obtained and witnessed.  PROCEDURE IN DETAIL: The patient was brought to the operating room and transferred to the operative table. After induction of general anesthesia, the patient was positioned on the operative table in the supine position with all pressure points meticulously padded. The skin of the neck was then prepped and draped in the usual sterile fashion.  Physician driven timeout was performed.  After timeout was conducted, skin incision was then made sharply with a 10  blade and Bovie electrocautery was used to dissect the subcutaneous tissue until the platysma was identified. The platysma was then divided and undermined. The sternocleidomastoid muscle was then identified and, utilizing natural fascial planes in the neck, the prevertebral fascia was identified and the carotid sheath was retracted laterally and the trachea and esophagus retracted medially. Again using fluoroscopy, the correct disc space was identified and confirmed with lateral fluoroscopy. Bovie electrocautery was used to dissect in the subperiosteal plane and elevate the bilateral longus coli muscles. Self-retaining retractors were then placed under the longus coli muscles bilaterally. At this point, the microscope was draped and brought into the field, and the remainder of the case was done under the microscope using microdissecting technique.  Distraction pins were placed in midline above and below the disc space and confirmed to be at midline with AP imaging.  The disc  space was placed in distraction.  The disc space was incised sharply and rongeurs were use to initially complete a discectomy. The high-speed drill was then used to complete discectomy until the posterior annulus was identified and removed and the posterior longitudinal ligament was identified. Using microcurettes, the PLL was elevated, and Kerrison rongeurs were used to remove the posterior longitudinal ligament and the ventral thecal sac was identified. Using a combination of curettes and ronguers, complete decompression of the thecal sac and exiting nerve roots at this level was completed, and verified using micro-nerve hook. The disc space was taken out of distraction.  There is a significant disc osteophyte complex with thecal sac compression and left foraminal stenosis that was quite calcified.  This was ultimately removed with a series of micro curettes and Kerrison rongeur.  The bilateral lateral recess and foramen were felt with the  micro nerve hook and noted to be appropriately decompressed.  Epidural hemostasis was achieved with Surgifoam.  Having completed our decompression, attention was turned to placement of the intervertebral device.  Trial spacers were used and a 5 x 14 mm device was appropriate given disc height and depth on lateral fluoroscopy.  AP fluoroscopy confirmed midline placement.  The endplates at C5 and C6 were scored with the manufacturer's device, and the above listed arthroplasty device was placed.  AP and lateral fluoroscopy confirmed appropriate placement.  The retractors were let down, and removed.  The wound was monitored for series of minutes and copiously irrigated.  It was noted to be excellently hemostatic.  The wound was then closed in layers, platysma was closed with 2-0 Vicryl sutures.  Dermis was closed with 3-0 Vicryl sutures.  Counts of soft and sharps were correct x2.  Skin was closed with skin glue.  Sterile dressing applied.  The patient tolerated the procedure well and was extubated in the room and taken to the postanesthesia care unit in stable condition.

## 2022-11-27 NOTE — Anesthesia Preprocedure Evaluation (Addendum)
Anesthesia Evaluation  Patient identified by MRN, date of birth, ID band Patient awake    Reviewed: Allergy & Precautions, NPO status , Patient's Chart, lab work & pertinent test results, reviewed documented beta blocker date and time   History of Anesthesia Complications (+) PONV and history of anesthetic complications  Airway Mallampati: III  TM Distance: >3 FB Neck ROM: Limited    Dental  (+) Dental Advisory Given, Chipped   Pulmonary asthma    Pulmonary exam normal        Cardiovascular negative cardio ROS Normal cardiovascular exam     Neuro/Psych  Headaches PSYCHIATRIC DISORDERS  Depression       GI/Hepatic Neg liver ROS,GERD  Medicated and Controlled,,  Endo/Other  Hypothyroidism   Pre-DM Hashimoto's disease   Renal/GU negative Renal ROS     Musculoskeletal  (+) Arthritis ,    Abdominal   Peds  Hematology negative hematology ROS (+)   Anesthesia Other Findings   Reproductive/Obstetrics                             Anesthesia Physical Anesthesia Plan  ASA: 2  Anesthesia Plan: General   Post-op Pain Management: Tylenol PO (pre-op)* and Ketamine IV*   Induction: Intravenous  PONV Risk Score and Plan: 4 or greater and Treatment may vary due to age or medical condition, Ondansetron, Dexamethasone, Midazolam and Scopolamine patch - Pre-op  Airway Management Planned: Oral ETT and Video Laryngoscope Planned  Additional Equipment: None  Intra-op Plan:   Post-operative Plan: Extubation in OR  Informed Consent: I have reviewed the patients History and Physical, chart, labs and discussed the procedure including the risks, benefits and alternatives for the proposed anesthesia with the patient or authorized representative who has indicated his/her understanding and acceptance.     Dental advisory given  Plan Discussed with: CRNA and Anesthesiologist  Anesthesia Plan  Comments:        Anesthesia Quick Evaluation

## 2022-11-27 NOTE — Transfer of Care (Signed)
Immediate Anesthesia Transfer of Care Note  Patient: Barbara Wood  Procedure(s) Performed: ARTIFICIAL DISC REPLACEMENT Cervical five-six  Patient Location: PACU  Anesthesia Type:General  Level of Consciousness: awake, alert , and oriented  Airway & Oxygen Therapy: Patient Spontanous Breathing and Patient connected to face mask oxygen  Post-op Assessment: Report given to RN and Post -op Vital signs reviewed and stable  Post vital signs: Reviewed and stable  Last Vitals:  Vitals Value Taken Time  BP 114/69 11/27/22 1745  Temp    Pulse 77 11/27/22 1747  Resp 14 11/27/22 1747  SpO2 90 % 11/27/22 1747  Vitals shown include unvalidated device data.  Last Pain:  Vitals:   11/27/22 1151  TempSrc:   PainSc: 3       Patients Stated Pain Goal: 0 (25/85/27 7824)  Complications: No notable events documented.

## 2022-11-27 NOTE — Anesthesia Procedure Notes (Signed)
Procedure Name: Intubation Date/Time: 11/27/2022 3:09 PM  Performed by: Reece Agar, CRNAPre-anesthesia Checklist: Patient identified, Emergency Drugs available, Suction available and Patient being monitored Patient Re-evaluated:Patient Re-evaluated prior to induction Oxygen Delivery Method: Circle System Utilized Preoxygenation: Pre-oxygenation with 100% oxygen Induction Type: IV induction Ventilation: Mask ventilation without difficulty Laryngoscope Size: Mac and 3 Grade View: Grade I Tube type: Oral Number of attempts: 1 Airway Equipment and Method: Stylet Placement Confirmation: ETT inserted through vocal cords under direct vision, positive ETCO2 and breath sounds checked- equal and bilateral Secured at: 22 cm Tube secured with: Tape Dental Injury: Teeth and Oropharynx as per pre-operative assessment
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
## 2022-11-28 ENCOUNTER — Other Ambulatory Visit (HOSPITAL_COMMUNITY): Payer: Self-pay

## 2022-11-28 ENCOUNTER — Ambulatory Visit: Payer: 59 | Admitting: Psychiatry

## 2022-11-28 DIAGNOSIS — M4802 Spinal stenosis, cervical region: Secondary | ICD-10-CM | POA: Diagnosis not present

## 2022-11-28 DIAGNOSIS — M4712 Other spondylosis with myelopathy, cervical region: Secondary | ICD-10-CM | POA: Diagnosis not present

## 2022-11-28 DIAGNOSIS — M4722 Other spondylosis with radiculopathy, cervical region: Secondary | ICD-10-CM | POA: Diagnosis not present

## 2022-11-28 DIAGNOSIS — J45909 Unspecified asthma, uncomplicated: Secondary | ICD-10-CM | POA: Diagnosis not present

## 2022-11-28 MED ORDER — OXYCODONE HCL 5 MG PO TABS
5.0000 mg | ORAL_TABLET | ORAL | 0 refills | Status: DC | PRN
  Filled 2022-11-28: qty 30, 4d supply, fill #0

## 2022-11-28 MED ORDER — BACLOFEN 5 MG PO TABS
5.0000 mg | ORAL_TABLET | Freq: Three times a day (TID) | ORAL | 2 refills | Status: DC | PRN
  Filled 2022-11-28: qty 90, 30d supply, fill #0
  Filled 2023-01-06: qty 90, 30d supply, fill #1
  Filled 2023-02-11: qty 90, 30d supply, fill #2

## 2022-11-28 NOTE — Anesthesia Postprocedure Evaluation (Signed)
Anesthesia Post Note  Patient: Chartered certified accountant  Procedure(s) Performed: ARTIFICIAL DISC REPLACEMENT Cervical five-six     Patient location during evaluation: PACU Anesthesia Type: General Level of consciousness: awake and alert Pain management: pain level controlled Vital Signs Assessment: post-procedure vital signs reviewed and stable Respiratory status: spontaneous breathing, nonlabored ventilation and respiratory function stable Cardiovascular status: stable and blood pressure returned to baseline Anesthetic complications: no   No notable events documented.  Last Vitals:  Vitals:   11/28/22 0349 11/28/22 0819  BP: 132/70 137/83  Pulse: 84 (!) 106  Resp: 18 18  Temp: 36.8 C 36.8 C  SpO2: 95% 98%    Last Pain:  Vitals:   11/28/22 0819  TempSrc: Oral  PainSc:                  Audry Pili

## 2022-11-28 NOTE — Plan of Care (Signed)
Patient alert oriented, patient void, VSS. Surgical site clean and dry,no sign of infection. D/c instructions given to the patient all questions answered. Patient d/c home per order. Problem: Education: Goal: Ability to verbalize activity precautions or restrictions will improve Outcome: Completed/Met Goal: Knowledge of the prescribed therapeutic regimen will improve Outcome: Completed/Met Goal: Understanding of discharge needs will improve Outcome: Completed/Met

## 2022-11-28 NOTE — Progress Notes (Signed)
PT Cancellation Note and Discharge  Patient Details Name: Barbara Wood MRN: [id number] DOB: 1970-04-22   Cancelled Treatment:    Reason Eval/Treat Not Completed: PT screened, no needs identified, will sign off.   Thelma Comp 11/28/2022, 8:25 AM  Rolinda Roan, PT, DPT Acute Rehabilitation Services Secure Chat Preferred Office: [phone]

## 2022-11-28 NOTE — Discharge Summary (Signed)
Physician Discharge Summary  Patient ID: Barbara Wood MRN: [id number] DOB/AGE: 09-22-1970 52 y.o.  Admit date: 11/27/2022 Discharge date: 11/28/2022  Admission Diagnoses:  {***}  Discharge Diagnoses:  Same Principal Problem:   Cervical myelopathy [facility])   Discharged Condition: Stable  Hospital Course:  Barbara Wood is a 52 y.o. female {***}  Treatments: Surgery - {***}  Discharge Exam: Blood pressure 132/70, pulse 84, temperature 98.3 F (36.8 C), temperature source Oral, resp. rate 18, height _0  (1.702 m), weight 88 kg, last menstrual period 10/31/2019, SpO2 95 %. Awake, alert, oriented Speech fluent, appropriate CN grossly intact 5/5 BUE/BLE Wound c/d/i  Disposition: Discharge disposition: 01-Home or Self Care       Discharge Instructions     Incentive spirometry RT   Complete by: As directed       Allergies as of 11/28/2022       Reactions   Amoxicillin-pot Clavulanate Nausea And Vomiting   Augmentin causes nausea and vomiting   Sertraline Other (See Comments)   hallucinations    Sulfamethoxazole-trimethoprim Hives   Triptans Other (See Comments)   Chest pain        Medication List     STOP taking these medications    HYDROcodone-acetaminophen 5-325 MG tablet Commonly known as: Norco       TAKE these medications    Aimovig 140 MG/ML Soaj Generic drug: Erenumab-aooe Inject 140 mg into the skin every 30 (thirty) days.   albuterol 108 (90 Base) MCG/ACT inhaler Commonly known as: ProAir HFA Inhale 2 puffs into the lungs every 6 (six) hours as needed for wheezing or shortness of breath.   albuterol 108 (90 Base) MCG/ACT inhaler Commonly known as: VENTOLIN HFA Inhale 2 puffs into the lungs every 6 (six) hours as needed for wheezing or shortness of breath   ARIPiprazole 10 MG tablet Commonly known as: ABILIFY Take 1 tablet (10 mg total) by mouth daily.   atenolol 25 MG tablet Commonly known as: TENORMIN Take 25 mg by mouth  daily.   Baclofen 5 MG Tabs Take 5 mg by mouth 3 (three) times daily as needed (spasms). What changed:  when to take this reasons to take this   betamethasone dipropionate 0.05 % cream Apply topically 2 (two) times daily.   betamethasone dipropionate 0.05 % lotion Apply 1 application topically to the scalp daily. Taper use as able.   budesonide-formoterol 80-4.5 MCG/ACT inhaler Commonly known as: SYMBICORT Inhale 2 puffs into the lungs 2 (two) times daily.   Carestart COVID-19 Home Test Kit Generic drug: COVID-19 At Home Antigen Test use as directed   cetirizine 10 MG tablet Commonly known as: ZYRTEC Take 1 tablet by mouth daily.   diclofenac 25 MG EC tablet Commonly known as: VOLTAREN Take 75 mg by mouth 2 (two) times daily.   Fluocinolone Acetonide Scalp 0.01 % Oil Commonly known as: Derma-Smoothe/FS Scalp Apply TID   lidocaine 5 % Commonly known as: Lidoderm Place 1 patch onto the skin daily. Remove & Discard patch within 12 hours or as directed by MD   liothyronine 5 MCG tablet Commonly known as: CYTOMEL Take 1 tablet (5 mcg total) by mouth in the morning, at noon, and at bedtime.   lisdexamfetamine 50 MG capsule Commonly known as: VYVANSE Take 1 capsule (50 mg total) by mouth daily.   Vyvanse 50 MG capsule Generic drug: lisdexamfetamine Take 1 capsule (50 mg total) by mouth daily.   lisdexamfetamine 50 MG capsule Commonly known as: Vyvanse Take 1 capsule (50  mg total) by mouth daily.   LORazepam 0.5 MG tablet Commonly known as: Ativan Take 1-2 tablets by mouth 30 minutes prior to MRIs   LORazepam 0.5 MG tablet Commonly known as: ATIVAN Take 1 tablet (0.5 mg total) by mouth daily as needed for anxiety.   metaxalone 800 MG tablet Commonly known as: Skelaxin Take 1 tablet (800 mg total) by mouth 3 (three) times daily.   montelukast 10 MG tablet Commonly known as: SINGULAIR Take 1 tablet (10 mg total) by mouth at bedtime.   [name] 75 MG  Tbdp Generic drug: Rimegepant Sulfate Take 1 tablet by mouth daily as needed. What changed: Another medication with the same name was changed. Make sure you understand how and when to take each.   [name] 75 MG Tbdp Generic drug: Rimegepant Sulfate Take 1 tablet by mouth daily as needed. What changed:  how much to take reasons to take this   omeprazole 20 MG capsule Commonly known as: PRILOSEC Take 1 capsule (20 mg total) by mouth daily.   oxyCODONE 5 MG immediate release tablet Commonly known as: Oxy IR/ROXICODONE Take 1 tablet (5 mg total) by mouth every 3 (three) hours as needed for moderate pain ((score 4 to 6)).   progesterone 100 MG capsule Commonly known as: PROMETRIUM Take 1 capsule (100 mg total) by mouth daily.   promethazine 25 MG tablet Commonly known as: PHENERGAN Take 1 tablet (25 mg total) by mouth every 6 (six) hours as needed for nausea or vomiting.   RHINOCORT ALLERGY NA Place 1 spray into both nostrils 2 (two) times daily.   Synthroid 100 MCG tablet Generic drug: levothyroxine Take 1 tablet (100 mcg total) by mouth daily.   traMADol 50 MG tablet Commonly known as: ULTRAM Take 2 tablets (100 mg total) by mouth 2 (two) times daily.   traZODone 50 MG tablet Commonly known as: DESYREL Take 1-2 tablets (50-100 mg total) by mouth at bedtime as needed for sleep. What changed: how much to take   Ubrelvy 100 MG Tabs Generic drug: Ubrogepant Take 100 mg by mouth as needed. What changed: reasons to take this   Vitamin D3 250 MCG (10000 UT) capsule Take 10,000 Units by mouth daily.        Follow-up Information     Dennison Mcdaid C, DO Follow up in 3 week(s).   Contact information: [address] [place] [address] [phone]                 Signed: Theodoro Doing Lanell Dubie 11/28/2022, 8:07 AM

## 2022-11-28 NOTE — Progress Notes (Signed)
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 Patient with no questions for this therapist, with patient stating she has no acute PT needs either. PT informed at end of session.   Corinne Ports E. Earmon Sherrow, OTR/L Acute Rehabilitation Services [phone]   Ascencion Dike 11/28/2022, 8:07 AM

## 2022-11-29 ENCOUNTER — Encounter (HOSPITAL_COMMUNITY): Payer: Self-pay | Admitting: Neurological Surgery

## 2022-11-29 ENCOUNTER — Inpatient Hospital Stay (HOSPITAL_COMMUNITY)
Admission: RE | Admit: 2022-11-29 | Discharge: 2022-11-29 | Disposition: A | Discharge status: Home / Self Care | Payer: 59 | Source: Ambulatory Visit

## 2022-11-30 ENCOUNTER — Telehealth: Payer: Self-pay | Admitting: Family Medicine

## 2022-11-30 NOTE — Telephone Encounter (Signed)
Sent patient MyChart message.

## 2022-11-30 NOTE — Telephone Encounter (Signed)
Pt had neck surgery 11/27/2022, doing well. She wanted Dr. Thompson Caul opinion on when she could have lower back OMT ( not neck ).  She is in no rush and does not need it yet, but wanted to schedule a future date with his recommendation.  Please respond via MyChart.

## 2022-12-04 ENCOUNTER — Other Ambulatory Visit (HOSPITAL_COMMUNITY): Payer: Self-pay

## 2022-12-04 ENCOUNTER — Other Ambulatory Visit: Payer: Self-pay | Admitting: Internal Medicine

## 2022-12-04 DIAGNOSIS — G4700 Insomnia, unspecified: Secondary | ICD-10-CM

## 2022-12-04 MED ORDER — TRAZODONE HCL 50 MG PO TABS
50.0000 mg | ORAL_TABLET | Freq: Every evening | ORAL | 1 refills | Status: DC | PRN
  Filled 2022-12-04: qty 60, 30d supply, fill #0
  Filled 2023-01-06: qty 60, 30d supply, fill #1

## 2022-12-05 ENCOUNTER — Ambulatory Visit: Payer: 59 | Admitting: Family Medicine

## 2022-12-07 ENCOUNTER — Encounter: Payer: Self-pay | Admitting: Psychiatry

## 2022-12-11 NOTE — Telephone Encounter (Signed)
Pt called wanting to know if it will be possible to get a sample so that the PA does not have to be put in twice due to new ins kicking in Jan 1st. Please advise.

## 2022-12-12 ENCOUNTER — Telehealth: Payer: Self-pay | Admitting: Psychiatry

## 2022-12-12 ENCOUNTER — Ambulatory Visit (INDEPENDENT_AMBULATORY_CARE_PROVIDER_SITE_OTHER): Payer: 59 | Admitting: Psychiatry

## 2022-12-12 DIAGNOSIS — F4310 Post-traumatic stress disorder, unspecified: Secondary | ICD-10-CM
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
## 2022-12-12 NOTE — Telephone Encounter (Signed)
[name], rasnic are scheduled for a virtual visit with your provider today.    Just as we do with appointments in the office, we must obtain your consent to participate.  Your consent will be active for this visit and any virtual visit you may have with one of our providers in the next 365 days.    If you have a MyChart account, I can also send a copy of this consent to you electronically.  All virtual visits are billed to your insurance company just like a traditional visit in the office.  As this is a virtual visit, video technology does not allow for your provider to perform a traditional examination.  This may limit your provider's ability to fully assess your condition.  If your provider identifies any concerns that need to be evaluated in person or the need to arrange testing such as labs, EKG, etc, we will make arrangements to do so.    Although advances in technology are sophisticated, we cannot ensure that it will always work on either your end or our end.  If the connection with a video visit is poor, we may have to switch to a telephone visit.  With either a video or telephone visit, we are not always able to ensure that we have a secure connection.   I need to obtain your verbal consent now.   Are you willing to proceed with your visit today?   Barbara Wood has provided verbal consent on 12/12/2022 for a virtual visit (video or telephone).   [name], [facility] 12/12/2022  4:10 PM

## 2022-12-17 ENCOUNTER — Encounter: Payer: Self-pay | Admitting: Family Medicine

## 2022-12-17 ENCOUNTER — Ambulatory Visit: Payer: 59 | Admitting: Family Medicine

## 2022-12-17 VITALS — BP 112/76 | HR 70 | Ht 67.0 in | Wt 198.0 lb

## 2022-12-17 DIAGNOSIS — R231 Pallor: Secondary | ICD-10-CM | POA: Diagnosis not present

## 2022-12-17 DIAGNOSIS — M2550 Pain in unspecified joint: Secondary | ICD-10-CM

## 2022-12-17 DIAGNOSIS — M25511 Pain in right shoulder: Secondary | ICD-10-CM | POA: Diagnosis not present

## 2022-12-17 DIAGNOSIS — M9902 Segmental and somatic dysfunction of thoracic region: Secondary | ICD-10-CM

## 2022-12-17 DIAGNOSIS — M9903 Segmental and somatic dysfunction of lumbar region: Secondary | ICD-10-CM

## 2022-12-17 DIAGNOSIS — M9908 Segmental and somatic dysfunction of rib cage: Secondary | ICD-10-CM

## 2022-12-17 DIAGNOSIS — M9904 Segmental and somatic dysfunction of sacral region: Secondary | ICD-10-CM

## 2022-12-17 MED ORDER — KETOROLAC TROMETHAMINE 30 MG/ML IJ SOLN
30.0000 mg | Freq: Once | INTRAMUSCULAR | Status: AC
  Administered 2022-12-17: 30 mg via INTRAMUSCULAR

## 2022-12-17 MED ORDER — METHYLPREDNISOLONE ACETATE 40 MG/ML IJ SUSP
40.0000 mg | Freq: Once | INTRAMUSCULAR | Status: AC
  Administered 2022-12-17: 40 mg via INTRAMUSCULAR

## 2022-12-17 NOTE — Progress Notes (Signed)
[facility] Phone: [phone] Subjective:   [name], am serving as a scribe for Dr. Hulan Saas.  I'm seeing this patient by the request  of:  Isaac Bliss, Rayford Halsted, MD  CC: Upper back pain  [id number]  Kamora Vossler is a 52 y.o. female coming in with complaint of back and neck pain. OMT 10/25/2022. Cervical disc replacement 11/27/2022. Patient states that she is having pain in L scapula. Painful to move her arm away from her body. Pain will be 8/10. Was helping brother grab something in closet. Migraines have improved since surgery. Numbness in hands and feet has also improve.   Medications patient has been prescribed: None  Taking:         Reviewed prior external information including notes and imaging from previsou exam, outside providers and external EMR if available.   As well as notes that were available from care everywhere and other healthcare systems.  Past medical history, social, surgical and family history all reviewed in electronic medical record.  No pertanent information unless stated regarding to the chief complaint.   Past Medical History:  Diagnosis Date   Allergy Age 55   Arthritis    shoulders, rt hip   Asthma Since age 35   Depression Age 52   Family history of adverse reaction to anesthesia    mother and sister   GERD (gastroesophageal reflux disease)    Hashimoto's disease    Heart murmur Mitral valve prolapse age 89   Migraine    PONV (postoperative nausea and vomiting)    Pre-diabetes    Psoriasis    Thyroid disease Since age 62    Allergies  Allergen Reactions   Amoxicillin-Pot Clavulanate Nausea And Vomiting    Augmentin causes nausea and vomiting   Sertraline Other (See Comments)    hallucinations    Sulfamethoxazole-Trimethoprim Hives   Triptans Other (See Comments)    Chest pain      Review of Systems:  No headache, visual changes, nausea,  vomiting, diarrhea, constipation, dizziness, abdominal pain, skin rash, fevers, chills, night sweats, weight loss, swollen lymph nodes, joint swelling, chest pain, shortness of breath, mood changes. POSITIVE muscle aches, body aches  Objective  Blood pressure 112/76, pulse 70, height '5\' 7"'$  (1.702 m), weight 198 lb (89.8 kg), last menstrual period 10/31/2019, SpO2 99 %.   General: No apparent distress alert and oriented x3 mood and affect normal, dressed appropriately.  HEENT: Pupils equal, extraocular movements intact  Respiratory: Patient's speak in full sentences and does not appear short of breath  Cardiovascular: No lower extremity edema, non tender, no erythema  Back exam does have some loss of lordosis.  More tenderness though noted in the left parascapular region.  Osteopathic findings   T3 extended rotated and side bent left inhaled rib T9 extended rotated and side bent left inhaled rib L2 flexed rotated and side bent right Sacrum right on right       Assessment and Plan:  Right shoulder pain Has had difficulty with the shoulders bilaterally right greater than left.  He is having acute pain on the left side and seems to be more of a slipped rib.  We did do any type of neck manipulation but did do some manipulation in the upper back.  Toradol and Depo-Medrol injections given today as well.  Discussed icing regimen and home exercises otherwise.  Will follow-up again in 6 to 8 weeks  Nonallopathic problems  Decision today to treat with OMT was based on Physical Exam  After verbal consent patient was treated with HVLA, ME, FPR techniques in  rib, thoracic, lumbar, and sacral  areas avoided any type of manipulation on patient's neck with recent surgery  Patient tolerated the procedure well with improvement in symptoms  Patient given exercises, stretches and lifestyle modifications  See medications in patient instructions if given  Patient will follow up in 4-8 weeks     The above documentation has been reviewed and is accurate and complete Lyndal Pulley, DO          Note: This dictation was prepared with Dragon dictation along with smaller phrase technology. Any transcriptional errors that result from this process are unintentional.

## 2022-12-17 NOTE — Assessment & Plan Note (Signed)
Have ordered some labs with patient to see primary care tomorrow.  Has been using the diclofenac on a regular basis.  No significant change in bowel habits.  Patient is postmenopausal.

## 2022-12-17 NOTE — Patient Instructions (Addendum)
Injections in backside See me again as scheduled

## 2022-12-17 NOTE — Assessment & Plan Note (Signed)
Has had difficulty with the shoulders bilaterally right greater than left.  He is having acute pain on the left side and seems to be more of a slipped rib.  We did do any type of neck manipulation but did do some manipulation in the upper back.  Toradol and Depo-Medrol injections given today as well.  Discussed icing regimen and home exercises otherwise.  Will follow-up again in 6 to 8 weeks

## 2022-12-18 ENCOUNTER — Encounter: Payer: Self-pay | Admitting: Internal Medicine

## 2022-12-18 ENCOUNTER — Other Ambulatory Visit (HOSPITAL_COMMUNITY): Payer: Self-pay

## 2022-12-18 ENCOUNTER — Ambulatory Visit (INDEPENDENT_AMBULATORY_CARE_PROVIDER_SITE_OTHER): Payer: 59 | Admitting: Internal Medicine

## 2022-12-18 VITALS — BP 110/80 | HR 56 | Temp 98.0°F | Ht 67.0 in | Wt 199.8 lb

## 2022-12-18 DIAGNOSIS — Z0000 Encounter for general adult medical examination without abnormal findings: Secondary | ICD-10-CM | POA: Diagnosis not present

## 2022-12-18 DIAGNOSIS — G43909 Migraine, unspecified, not intractable, without status migrainosus: Secondary | ICD-10-CM | POA: Diagnosis not present

## 2022-12-18 DIAGNOSIS — N951 Menopausal and female climacteric states: Secondary | ICD-10-CM

## 2022-12-18 DIAGNOSIS — Z23 Encounter for immunization: Secondary | ICD-10-CM

## 2022-12-18 DIAGNOSIS — F988 Other specified behavioral and emotional disorders with onset usually occurring in childhood and adolescence: Secondary | ICD-10-CM

## 2022-12-18 DIAGNOSIS — M7918 Myalgia, other site: Secondary | ICD-10-CM

## 2022-12-18 LAB — CBC WITH DIFFERENTIAL/PLATELET
Basophils Absolute: 0 10*3/uL (ref 0.0–0.1)
Basophils Relative: 0.4 % (ref 0.0–3.0)
Eosinophils Absolute: 0.2 10*3/uL (ref 0.0–0.7)
Eosinophils Relative: 3.2 % (ref 0.0–5.0)
HCT: 36.3 % (ref 36.0–46.0)
Hemoglobin: 11.8 g/dL — ABNORMAL LOW (ref 12.0–15.0)
Lymphocytes Relative: 29.5 % (ref 12.0–46.0)
Lymphs Abs: 2.1 10*3/uL (ref 0.7–4.0)
MCHC: 32.4 g/dL (ref 30.0–36.0)
MCV: 84.2 fl (ref 78.0–100.0)
Monocytes Absolute: 0.5 10*3/uL (ref 0.1–1.0)
Monocytes Relative: 7 % (ref 3.0–12.0)
Neutro Abs: 4.2 10*3/uL (ref 1.4–7.7)
Neutrophils Relative %: 59.9 % (ref 43.0–77.0)
Platelets: 230 10*3/uL (ref 150.0–400.0)
RBC: 4.31 Mil/uL (ref 3.87–5.11)
RDW: 14.4 % (ref 11.5–15.5)
WBC: 7 10*3/uL (ref 4.0–10.5)

## 2022-12-18 LAB — COMPREHENSIVE METABOLIC PANEL
ALT: 20 U/L (ref 0–35)
AST: 17 U/L (ref 0–37)
Albumin: 3.9 g/dL (ref 3.5–5.2)
Alkaline Phosphatase: 86 U/L (ref 39–117)
BUN: 15 mg/dL (ref 6–23)
CO2: 29 mEq/L (ref 19–32)
Calcium: 9.2 mg/dL (ref 8.4–10.5)
Chloride: 105 mEq/L (ref 96–112)
Creatinine, Ser: 0.81 mg/dL (ref 0.40–1.20)
GFR: 84.89 mL/min (ref >60.00)
Glucose, Bld: 95 mg/dL (ref 70–99)
Potassium: 3.9 mEq/L (ref 3.5–5.1)
Sodium: 141 mEq/L (ref 135–145)
Total Bilirubin: 0.6 mg/dL (ref 0.2–1.2)
Total Protein: 6.6 g/dL (ref 6.0–8.3)

## 2022-12-18 LAB — VITAMIN D 25 HYDROXY (VIT D DEFICIENCY, FRACTURES): VITD: 62.55 ng/mL (ref 30.00–100.00)

## 2022-12-18 LAB — LIPID PANEL
Cholesterol: 163 mg/dL (ref 0–200)
HDL: 67.8 mg/dL (ref >39.00)
LDL Cholesterol: 78 mg/dL (ref 0–99)
NonHDL: 95.06
Total CHOL/HDL Ratio: 2
Triglycerides: 85 mg/dL (ref 0.0–149.0)
VLDL: 17 mg/dL (ref 0.0–40.0)

## 2022-12-18 LAB — VITAMIN B12: Vitamin B-12: 304 pg/mL (ref 211–911)

## 2022-12-18 LAB — TSH: TSH: 0.62 u[IU]/mL (ref 0.35–5.50)

## 2022-12-18 LAB — HEMOGLOBIN A1C: Hgb A1c MFr Bld: 5.9 % (ref 4.6–6.5)

## 2022-12-18 MED ORDER — LISDEXAMFETAMINE DIMESYLATE 50 MG PO CAPS
50.0000 mg | ORAL_CAPSULE | Freq: Every day | ORAL | 0 refills | Status: DC
  Filled 2022-12-18 – 2022-12-28 (×2): qty 30, 30d supply, fill #0

## 2022-12-18 MED ORDER — TRAMADOL HCL 50 MG PO TABS
100.0000 mg | ORAL_TABLET | Freq: Two times a day (BID) | ORAL | 0 refills | Status: DC
  Filled 2022-12-18 – 2023-01-07 (×2): qty 360, 90d supply, fill #0

## 2022-12-18 MED ORDER — LISDEXAMFETAMINE DIMESYLATE 50 MG PO CAPS
50.0000 mg | ORAL_CAPSULE | Freq: Every day | ORAL | 0 refills | Status: DC
  Filled 2022-12-18 – 2022-12-28 (×2): qty 30, 30d supply, fill #0
  Filled 2023-01-29: qty 20, 20d supply, fill #0
  Filled 2023-02-01: qty 10, 10d supply, fill #0

## 2022-12-18 MED ORDER — LISDEXAMFETAMINE DIMESYLATE 50 MG PO CAPS
50.0000 mg | ORAL_CAPSULE | Freq: Every day | ORAL | 0 refills | Status: DC
  Filled 2022-12-18 – 2023-03-03 (×2): qty 30, 30d supply, fill #0

## 2022-12-18 MED ORDER — PREMARIN 0.625 MG/GM VA CREA
0.5000 g | TOPICAL_CREAM | VAGINAL | 12 refills | Status: DC
  Filled 2022-12-18: qty 30, 90d supply, fill #0

## 2022-12-18 NOTE — Progress Notes (Signed)
Established Patient Office Visit     CC/Reason for Visit: Annual preventive exam  HPI: Barbara Wood is a 52 y.o. female who is coming in today for the above mentioned reasons. Past Medical History is significant for: ADHD on stable dose of Vyvanse for approximately 9 years, she has a very significant history of migraines currently on Aimovig and [name] as needed.  She has myofascial pain and cervical disc disease gets OMT by Dr. Gardenia Phlegm she takes tramadol daily and has a very sporadic prescription for Norco for migraine relief when the [name] and Emgality do not take care of it.  She also has a history of impaired glucose tolerance.  She also has hypothyroidism.  She was recently found to have C5-6 cervical stenosis with left upper extremity radiculopathy and had a C5-6 total disc arthroplasty.  She tolerated procedure well.  She has been having some peri-/postmenopausal vaginal dryness.   Past Medical/Surgical History: Past Medical History:  Diagnosis Date   Allergy Age 42   Arthritis    shoulders, rt hip   Asthma Since age 37   Depression Age 89   Family history of adverse reaction to anesthesia    mother and sister   GERD (gastroesophageal reflux disease)    Hashimoto's disease    Heart murmur Mitral valve prolapse age 63   Migraine    PONV (postoperative nausea and vomiting)    Pre-diabetes    Psoriasis    Thyroid disease Since age 48    Past Surgical History:  Procedure Laterality Date   BREAST SURGERY  Reduction age 7   CERVICAL [place] ARTHROPLASTY N/A 11/27/2022   Procedure: ARTIFICIAL DISC REPLACEMENT Cervical five-six;  Surgeon: Karsten Ro, DO;  Location: [facility];  Service: Neurosurgery;  Laterality: N/A;   CHOLECYSTECTOMY  Age 89   KNEE ARTHROSCOPY W/ LATERAL RELEASE Right    REDUCTION MAMMAPLASTY     WISDOM TOOTH EXTRACTION      Social History:  reports that she has never smoked. She has never used smokeless tobacco. She reports current alcohol use. She  reports that she does not use drugs.  Allergies: Allergies  Allergen Reactions   Amoxicillin-Pot Clavulanate Nausea And Vomiting    Augmentin causes nausea and vomiting   Sertraline Other (See Comments)    hallucinations    Sulfamethoxazole-Trimethoprim Hives   Triptans Other (See Comments)    Chest pain     Family History:  Family History  Problem Relation Age of Onset   Asthma Sister    Miscarriages / [place] Sister    Depression Sister    Psoriasis Sister    Cancer Mother    Depression Mother    Hashimoto's thyroiditis Mother    Diabetes Mother    Bipolar disorder Mother    Hypertension Father    Ankylosing spondylitis Father    Psoriasis Father    Psoriasis Brother    Migraines Brother    ADD / ADHD Brother    Eczema Brother    Allergies Brother    Psoriasis Maternal Aunt    Psoriasis Sister    Arthritis Sister        psoriatic arthritis    Psoriasis Sister    Rheum arthritis Sister    Depression Sister    Anxiety disorder Sister    Eczema Sister    Psoriasis Brother    ADD / ADHD Daughter    Eczema Daughter    Asthma Daughter      Current  Outpatient Medications:    albuterol (PROAIR HFA) 108 (90 Base) MCG/ACT inhaler, Inhale 2 puffs into the lungs every 6 (six) hours as needed for wheezing or shortness of breath., Disp: 18 g, Rfl: 2   albuterol (VENTOLIN HFA) 108 (90 Base) MCG/ACT inhaler, Inhale 2 puffs into the lungs every 6 (six) hours as needed for wheezing or shortness of breath, Disp: 6.7 g, Rfl: 1   ARIPiprazole (ABILIFY) 10 MG tablet, Take 1 tablet (10 mg total) by mouth daily., Disp: 90 tablet, Rfl: 3   atenolol (TENORMIN) 25 MG tablet, Take 50 mg by mouth daily., Disp: , Rfl:    Baclofen 5 MG TABS, Take 5 mg by mouth 3 (three) times daily as needed (spasms)., Disp: 90 tablet, Rfl: 2   betamethasone dipropionate 0.05 % cream, Apply topically 2 (two) times daily., Disp: 45 g, Rfl: 5   betamethasone dipropionate 0.05 % lotion, Apply 1  application topically to the scalp daily. Taper use as able., Disp: 120 mL, Rfl: 3   Budesonide (RHINOCORT ALLERGY NA), Place 1 spray into both nostrils 2 (two) times daily., Disp: , Rfl:    budesonide-formoterol (SYMBICORT) 80-4.5 MCG/ACT inhaler, Inhale 2 puffs into the lungs 2 (two) times daily as needed., Disp: , Rfl:    cetirizine (ZYRTEC) 10 MG tablet, Take 1 tablet by mouth daily., Disp: , Rfl:    Cholecalciferol (VITAMIN D3) 250 MCG (10000 UT) capsule, Take 10,000 Units by mouth daily., Disp: , Rfl:    conjugated estrogens (PREMARIN) vaginal cream, Place 0.5 g vaginally 3 (three) times a week., Disp: 30 g, Rfl: 12   COVID-19 At Home Antigen Test (CARESTART COVID-19 HOME TEST) KIT, use as directed, Disp: 8 each, Rfl: 0   diclofenac (VOLTAREN) 25 MG EC tablet, Take 75 mg by mouth 2 (two) times daily., Disp: , Rfl:    Erenumab-aooe (AIMOVIG) 140 MG/ML SOAJ, Inject 140 mg into the skin every 30 (thirty) days., Disp: 1.12 mL, Rfl: 0   Fluocinolone Acetonide Scalp (DERMA-SMOOTHE/FS SCALP) 0.01 % OIL, Apply TID, Disp: 118.28 mL, Rfl: 5   levothyroxine (SYNTHROID) 100 MCG tablet, Take 1 tablet (100 mcg total) by mouth daily., Disp: 90 tablet, Rfl: 1   lidocaine (LIDODERM) 5 %, Place 1 patch onto the skin daily. Remove & Discard patch within 12 hours or as directed by MD, Disp: 30 patch, Rfl: 2   liothyronine (CYTOMEL) 5 MCG tablet, Take 1 tablet (5 mcg total) by mouth in the morning, at noon, and at bedtime., Disp: 270 tablet, Rfl: 3   lisdexamfetamine (VYVANSE) 50 MG capsule, Take 1 capsule (50 mg total) by mouth daily., Disp: 30 capsule, Rfl: 0   lisdexamfetamine (VYVANSE) 50 MG capsule, Take 1 capsule (50 mg total) by mouth daily., Disp: 30 capsule, Rfl: 0   lisdexamfetamine (VYVANSE) 50 MG capsule, Take 1 capsule (50 mg total) by mouth daily., Disp: 30 capsule, Rfl: 0   metaxalone (SKELAXIN) 800 MG tablet, Take 1 tablet (800 mg total) by mouth 3 (three) times daily., Disp: 90 tablet, Rfl: 1    montelukast (SINGULAIR) 10 MG tablet, Take 1 tablet (10 mg total) by mouth at bedtime., Disp: 90 tablet, Rfl: 3   omeprazole (PRILOSEC) 20 MG capsule, Take 1 capsule (20 mg total) by mouth daily., Disp: 30 capsule, Rfl: 3   oxyCODONE (OXY IR/ROXICODONE) 5 MG immediate release tablet, Take 1 tablet (5 mg total) by mouth every 3 (three) hours as needed for moderate pain ((score 4 to 6))., Disp: 30 tablet, Rfl: 0  progesterone (PROMETRIUM) 100 MG capsule, Take 1 capsule (100 mg total) by mouth daily., Disp: 90 capsule, Rfl: 0   promethazine (PHENERGAN) 25 MG tablet, Take 1 tablet (25 mg total) by mouth every 6 (six) hours as needed for nausea or vomiting., Disp: 30 tablet, Rfl: 3   Rimegepant Sulfate ([name]) 75 MG TBDP, Take 1 tablet by mouth daily as needed., Disp: 4 tablet, Rfl: 0   Rimegepant Sulfate ([name]) 75 MG TBDP, Take 1 tablet by mouth daily as needed. (Patient taking differently: Take 75 tablets by mouth daily as needed (migraine).), Disp: 16 tablet, Rfl: 2   traZODone (DESYREL) 50 MG tablet, Take 1-2 tablets (50-100 mg total) by mouth at bedtime as needed for sleep., Disp: 60 tablet, Rfl: 1   traMADol (ULTRAM) 50 MG tablet, Take 2 tablets (100 mg total) by mouth 2 (two) times daily., Disp: 360 tablet, Rfl: 0  Review of Systems:  Constitutional: Denies fever, chills, diaphoresis, appetite change and fatigue.  HEENT: Denies photophobia, eye pain, redness, hearing loss, ear pain, congestion, sore throat, rhinorrhea, sneezing, mouth sores, trouble swallowing, neck pain, neck stiffness and tinnitus.   Respiratory: Denies SOB, DOE, cough, chest tightness,  and wheezing.   Cardiovascular: Denies chest pain, palpitations and leg swelling.  Gastrointestinal: Denies nausea, vomiting, abdominal pain, diarrhea, constipation, blood in stool and abdominal distention.  Genitourinary: Denies dysuria, urgency, frequency, hematuria, flank pain and difficulty urinating.  Endocrine: Denies: hot or cold  intolerance, sweats, changes in hair or nails, polyuria, polydipsia. Musculoskeletal: Denies myalgias, back pain, joint swelling, arthralgias and gait problem.  Skin: Denies pallor, rash and wound.  Neurological: Denies dizziness, seizures, syncope, weakness, light-headedness, numbness and headaches.  Hematological: Denies adenopathy. Easy bruising, personal or family bleeding history  Psychiatric/Behavioral: Denies suicidal ideation, mood changes, confusion, nervousness, sleep disturbance and agitation    Physical Exam: Vitals:   12/18/22 0733  BP: 110/80  Pulse: (!) 56  Temp: 98 F (36.7 C)  TempSrc: Oral  SpO2: 98%  Weight: 199 lb 12.8 oz (90.6 kg)  Height: _0  (1.702 m)    Body mass index is 31.29 kg/m.   Constitutional: NAD, calm, comfortable Eyes: PERRL, lids and conjunctivae normal, wears corrective lenses ENMT: Mucous membranes are moist. Posterior pharynx clear of any exudate or lesions. Normal dentition. Tympanic membrane is pearly white, no erythema or bulging. Neck: normal, supple, no masses, no thyromegaly Respiratory: clear to auscultation bilaterally, no wheezing, no crackles. Normal respiratory effort. No accessory muscle use.  Cardiovascular: Regular rate and rhythm, no murmurs / rubs / gallops. No extremity edema. 2+ pedal pulses. No carotid bruits.  Abdomen: no tenderness, no masses palpated. No hepatosplenomegaly. Bowel sounds positive.  Musculoskeletal: no clubbing / cyanosis. No joint deformity upper and lower extremities. Good ROM, no contractures. Normal muscle tone.  Skin: no rashes, lesions, ulcers. No induration Neurologic: CN 2-12 grossly intact. Sensation intact, DTR normal. Strength 5/5 in all 4.  Psychiatric: Normal judgment and insight. Alert and oriented x 3. Normal mood.   [facility] Office Visit from 12/18/2022 in [facility]  PHQ-9 Total Score 4         Impression and Plan:  Encounter for preventive health  examination - Plan: CBC with Differential/Platelet, Comprehensive metabolic panel, Hemoglobin A1c, Lipid panel, TSH, Vitamin B12, VITAMIN D 25 Hydroxy (Vit-D Deficiency, Fractures), VITAMIN D 25 Hydroxy (Vit-D Deficiency, Fractures), Vitamin B12, TSH, Lipid panel, Hemoglobin A1c, Comprehensive metabolic panel, CBC with Differential/Platelet  Myofascial pain - Plan: traMADol (ULTRAM) 50 MG  tablet  Migraine without status migrainosus, not intractable, unspecified migraine type - Plan: traMADol (ULTRAM) 50 MG tablet  Vaginal dryness, menopausal - Plan: conjugated estrogens (PREMARIN) vaginal cream  Need for shingles vaccine - Plan: Zoster Recombinant (Shingrix )  Attention deficit disorder, unspecified hyperactivity presence  -Recommend routine eye and dental care. -Immunizations: Shingles in office today, advised to update COVID, other immunizations are up-to-date -Healthy lifestyle discussed in detail. -Labs to be updated today. -Colon cancer screening: 12/2015, 10-year follow-up -Breast cancer screening: 03/2022 -Cervical cancer screening: 06/2021 -Lung cancer screening: Not applicable -Prostate cancer screening: Not applicable -DEXA: Not applicable  For vaginal dryness we will try some vaginal estrogen cream.      Lelon Frohlich, MD [facility] at [facility]

## 2022-12-19 ENCOUNTER — Other Ambulatory Visit (HOSPITAL_COMMUNITY): Payer: Self-pay

## 2022-12-19 MED ORDER — HYDROCODONE-ACETAMINOPHEN 5-325 MG PO TABS
1.0000 | ORAL_TABLET | Freq: Four times a day (QID) | ORAL | 0 refills | Status: DC | PRN
  Filled 2022-12-19: qty 30, 8d supply, fill #0

## 2022-12-26 ENCOUNTER — Telehealth: Payer: Self-pay | Admitting: Internal Medicine

## 2022-12-26 NOTE — Telephone Encounter (Signed)
Pt called to say she is extremely depressed and currently taking ARIPiprazole (ABILIFY) 10 MG tablet   Pt stated she needs more help managing her depression and is asking for an additional medication, as she does not want to stop taking the Abilify, and she was told the dosage should not be increased.  Please advise.  LOV:  12/18/22  [phone]

## 2022-12-26 NOTE — Telephone Encounter (Signed)
Pt has been scheduled with Dr. Martin[place]. Pt stated that she was not able to wait until Dr. Jerilee Hoh returned to the office.

## 2022-12-26 NOTE — Telephone Encounter (Signed)
Pt has been scheduled with Dr.Burchette.there was a scheduling error.

## 2022-12-28 ENCOUNTER — Other Ambulatory Visit (HOSPITAL_COMMUNITY): Payer: Self-pay

## 2022-12-28 ENCOUNTER — Encounter: Payer: Self-pay | Admitting: Family Medicine

## 2022-12-28 ENCOUNTER — Ambulatory Visit: Payer: 59 | Admitting: Family Medicine

## 2022-12-28 VITALS — BP 100/70 | HR 69 | Temp 97.9°F | Ht 67.0 in | Wt 200.4 lb

## 2022-12-28 DIAGNOSIS — F339 Major depressive disorder, recurrent, unspecified: Secondary | ICD-10-CM

## 2022-12-28 NOTE — Patient Instructions (Signed)
Consider bumping the Abilify to 15 mg once daily  Be in touch in one week if not improving.

## 2022-12-28 NOTE — Progress Notes (Signed)
[name] [address] [place] [address] Phone: [phone] Subjective:   [name], am serving as a scribe for Dr. Antoine Primas.  I'm seeing this patient by the request  of:  Philip Aspen, Limmie Patricia, MD  CC: low back Pain follow-up  [id number]  Alto Mongold is a 52 y.o. female coming in with complaint of back and neck pain. OMT 12/17/2022. Patient states that she continues to have R shoulder pain. Pain is radiating into the neck. Using voltaren once every 2 days.   Medications patient has been prescribed: None  Taking:         Reviewed prior external information including notes and imaging from previsou exam, outside providers and external EMR if available.   As well as notes that were available from care everywhere and other healthcare systems.  Past medical history, social, surgical and family history all reviewed in electronic medical record.  No pertanent information unless stated regarding to the chief complaint.   Past Medical History:  Diagnosis Date   Allergy Age 45   Arthritis    shoulders, rt hip   Asthma Since age 45   Depression Age 49   Family history of adverse reaction to anesthesia    mother and sister   GERD (gastroesophageal reflux disease)    Hashimoto's disease    Heart murmur Mitral valve prolapse age 48   Migraine    PONV (postoperative nausea and vomiting)    Pre-diabetes    Psoriasis    Thyroid disease Since age 30    Allergies  Allergen Reactions   Amoxicillin-Pot Clavulanate Nausea And Vomiting    Augmentin causes nausea and vomiting   Sertraline Other (See Comments)    hallucinations    Sulfamethoxazole-Trimethoprim Hives   Triptans Other (See Comments)    Chest pain      Review of Systems:  No headache, visual changes, nausea, vomiting, diarrhea, constipation, dizziness, abdominal pain, skin rash, fevers, chills, night sweats, weight loss, swollen lymph nodes, body aches,  joint swelling, chest pain, shortness of breath, mood changes. POSITIVE muscle aches  Objective  Blood pressure 124/86, pulse 67, height 5\' 7"  (1.702 m), last menstrual period 10/31/2019, SpO2 99 %.   General: No apparent distress alert and oriented x3 mood and affect normal, dressed appropriately.  HEENT: Pupils equal, extraocular movements intact  Respiratory: Patient's speak in full sentences and does not appear short of breath  Cardiovascular: No lower extremity edema, non tender, no erythema  More discomfort in the back noted.  Seems to be more in the thoracolumbar junction but some in the rectal lumbar area. Right shoulder does have tenderness to palpation over the acromioclavicular joint.  Positive crossover noted.  Osteopathic findings  T3 extended rotated and side bent right inhaled rib T9 extended rotated and side bent left L2 flexed rotated and side bent right Sacrum right on right  Procedure: Real-time Ultrasound Guided Injection of right acromioclavicular joint Device: GE Logiq Q7 Ultrasound guided injection is preferred based studies that show increased duration, increased effect, greater accuracy, decreased procedural pain, increased response rate, and decreased cost with ultrasound guided versus blind injection.  Verbal informed consent obtained.  Time-out conducted.  Noted no overlying erythema, induration, or other signs of local infection.  Skin prepped in a sterile fashion.  Local anesthesia: Topical Ethyl chloride.  With sterile technique and under real time ultrasound guidance: With a 25-gauge half inch needle injecting 0.5 cc of 0.5% Marcaine and 0.5  cc of Kenalog 40 mg per Completed without difficulty  Pain immediately resolved suggesting accurate placement of the medication.  Advised to call if fevers/chills, erythema, induration, drainage, or persistent bleeding.  Impression: Technically successful ultrasound guided injection.     Assessment and  Plan:  Cervical myelopathy (HCC) Status post surgery.  Could be potentially contributing to some of the discomfort.  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  Discussed icing regimen and home exercises.  Follow-up again in 6 to 8 weeks still will hold on any manipulation of the neck at this time  Right shoulder pain Patient given injection and tolerated the procedure well, discussed icing regimen and home exercises.  Patient should do relatively well.  I do believe once patient is free to start increasing activity after her cervical surgery she will be able to increase strength and do much better.  Arthritis of right acromioclavicular joint Patient given injection and tolerated the procedure well.  Discussed icing regimen and home exercises, discussed with patient to continue to keep hands within peripheral vision.  Once again he does need some strengthening but is not released yet by neurosurgery.  Will follow-up again in 6 to 8 weeks    Nonallopathic problems  Decision today to treat with OMT was based on Physical Exam  After verbal consent patient was treated with HVLA, ME, FPR techniques in , rib, thoracic, lumbar, and sacral  areas  Patient tolerated the procedure well with improvement in symptoms  Patient given exercises, stretches and lifestyle modifications  See medications in patient instructions if given  Patient will follow up in 4-8 weeks    The above documentation has been reviewed and is accurate and complete Judi Saa, DO          Note: This dictation was prepared with Dragon dictation along with smaller phrase technology. Any transcriptional errors that result from this process are unintentional.

## 2022-12-28 NOTE — Progress Notes (Signed)
Established Patient Office Visit  Subjective   Patient ID: Barbara Wood, female    DOB: Jun 27, 1970  Age: 53 y.o. MRN: [id number]  Chief Complaint  Patient presents with   Depression    Started getting worse within the last week or two    HPI   Barbara Wood is here to discuss depression issues.  She has a longstanding history of recurrent depression.  She is currently on Abilify 10 mg daily.  She had questions about possibly bumping this up to 15 mg short-term.  She has had some recent increased depression symptoms.  She relates this partially to the fact that she had neck surgery in late November and has been out of work until this coming weekend.  She states that she is a self-described extrovert and being away from other people has been difficult.  Also, she has been fairly separated from her family over the holidays which has been difficult.  She does have a 68 year old daughter in town and she does get to spend some time with her.  She relates increased irritability recently.  She states that bipolar has been ruled out in herself.  Her mother does have history of bipolar.  Barbara Wood states that she has taken multiple antidepressants previously including Zoloft, Prozac, Paxil, Celexa, and Serzone.  She had either adverse side effects really did not work well for her.  She is getting regular ongoing counseling.  Denies any active suicidal ideation.  She did have some fleeting thoughts but does not feel there is any imminent risk at this time.  She feels some improved today compared with this past Monday.  Past Medical History:  Diagnosis Date   Allergy Age 64   Arthritis    shoulders, rt hip   Asthma Since age 80   Depression Age 52   Family history of adverse reaction to anesthesia    mother and sister   GERD (gastroesophageal reflux disease)    Hashimoto's disease    Heart murmur Mitral valve prolapse age 12   Migraine    PONV (postoperative nausea and vomiting)    Pre-diabetes     Psoriasis    Thyroid disease Since age 9   Past Surgical History:  Procedure Laterality Date   BREAST SURGERY  Reduction age 64   CERVICAL [facility]C ARTHROPLASTY N/A 11/27/2022   Procedure: ARTIFICIAL DISC REPLACEMENT Cervical five-six;  Surgeon: Bethann Goo, DO;  Location: [facility];  Service: Neurosurgery;  Laterality: N/A;   CHOLECYSTECTOMY  Age 44   KNEE ARTHROSCOPY W/ LATERAL RELEASE Right    REDUCTION MAMMAPLASTY     WISDOM TOOTH EXTRACTION      reports that she has never smoked. She has never used smokeless tobacco. She reports current alcohol use. She reports that she does not use drugs. family history includes ADD / ADHD in her brother and daughter; Allergies in her brother; Ankylosing spondylitis in her father; Anxiety disorder in her sister; Arthritis in her sister; Asthma in her daughter and sister; Bipolar disorder in her mother; Cancer in her mother; Depression in her mother, sister, and sister; Diabetes in her mother; Eczema in her brother, daughter, and sister; Hashimoto's thyroiditis in her mother; Hypertension in her father; Migraines in her brother; Miscarriages / [place] in her sister; Psoriasis in her brother, brother, father, maternal aunt, sister, sister, and sister; Rheum arthritis in her sister. Allergies  Allergen Reactions   Amoxicillin-Pot Clavulanate Nausea And Vomiting    Augmentin causes nausea and vomiting   Sertraline Other (  See Comments)    hallucinations    Sulfamethoxazole-Trimethoprim Hives   Triptans Other (See Comments)    Chest pain     Review of Systems  Constitutional:  Negative for chills and fever.  Cardiovascular:  Negative for chest pain.  Psychiatric/Behavioral:  Positive for depression. Negative for hallucinations.       Objective:     BP 100/70   Pulse 69   Temp 97.9 F (36.6 C) (Oral)   Ht 5\' 7"  (1.702 m)   Wt 200 lb 7 oz (90.9 kg)   LMP 10/31/2019   SpO2 99%   BMI 31.39 kg/m  BP Readings from Last 3 Encounters:   12/28/22 100/70  12/18/22 110/80  12/17/22 112/76   Wt Readings from Last 3 Encounters:  12/28/22 200 lb 7 oz (90.9 kg)  12/18/22 199 lb 12.8 oz (90.6 kg)  12/17/22 198 lb (89.8 kg)      Physical Exam Vitals reviewed.  Cardiovascular:     Rate and Rhythm: Normal rate and regular rhythm.  Pulmonary:     Effort: Pulmonary effort is normal.     Breath sounds: Normal breath sounds. No wheezing or rales.  Neurological:     Mental Status: She is alert.  Psychiatric:        Mood and Affect: Mood normal.        Thought Content: Thought content normal.        Judgment: Judgment normal.      No results found for any visits on 12/28/22.    The 10-year ASCVD risk score (Arnett DK, et al., 2019) is: 0.4%    Assessment & Plan:   Longstanding history of recurrent depression.  She has responded poorly or had major adverse side effects from several SSRIs previously.  Also, she takes trazodone at night as well as Ultram and is not a great candidate for serotonin reuptake inhibitors.  She is currently on Abilify 10 mg nightly.  She queries about increasing this to 15 mg short-term.  She will try this over the next week and then follow-up with primary if not improving.  We did discuss possible consideration for pharmaco-genomic testing at some point in the future to help with determining possibly best antidepressant choices for her.  Barbara Peat, MD

## 2023-01-02 ENCOUNTER — Ambulatory Visit: Payer: Commercial Managed Care - PPO | Admitting: Family Medicine

## 2023-01-02 ENCOUNTER — Ambulatory Visit: Payer: Self-pay

## 2023-01-02 ENCOUNTER — Ambulatory Visit (INDEPENDENT_AMBULATORY_CARE_PROVIDER_SITE_OTHER): Payer: Commercial Managed Care - PPO | Admitting: Psychiatry

## 2023-01-02 VITALS — BP 124/86 | HR 67 | Ht 67.0 in

## 2023-01-02 DIAGNOSIS — M9903 Segmental and somatic dysfunction of lumbar region: Secondary | ICD-10-CM

## 2023-01-02 DIAGNOSIS — F4310 Post-traumatic stress disorder, unspecified: Secondary | ICD-10-CM | POA: Diagnosis not present

## 2023-01-02 DIAGNOSIS — M19011 Primary osteoarthritis, right shoulder: Secondary | ICD-10-CM | POA: Diagnosis not present

## 2023-01-02 DIAGNOSIS — G959 Disease of spinal cord, unspecified: Secondary | ICD-10-CM

## 2023-01-02 DIAGNOSIS — M9902 Segmental and somatic dysfunction of thoracic region: Secondary | ICD-10-CM

## 2023-01-02 DIAGNOSIS — M25511 Pain in right shoulder: Secondary | ICD-10-CM

## 2023-01-02 DIAGNOSIS — M9904 Segmental and somatic dysfunction of sacral region: Secondary | ICD-10-CM

## 2023-01-02 NOTE — Assessment & Plan Note (Signed)
Patient given injection and tolerated the procedure well, discussed icing regimen and home exercises.  Patient should do relatively well.  I do believe once patient is free to start increasing activity after her cervical surgery she will be able to increase strength and do much better.

## 2023-01-02 NOTE — Assessment & Plan Note (Signed)
Status post surgery.  Could be potentially contributing to some of the discomfort.  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  Discussed icing regimen and home exercises.  Follow-up again in 6 to 8 weeks still will hold on any manipulation of the neck at this time

## 2023-01-02 NOTE — Assessment & Plan Note (Signed)
Patient given injection and tolerated the procedure well.  Discussed icing regimen and home exercises, discussed with patient to continue to keep hands within peripheral vision.  Once again he does need some strengthening but is not released yet by neurosurgery.  Will follow-up again in 6 to 8 weeks
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
## 2023-01-02 NOTE — Patient Instructions (Signed)
Injected shoulder in 2 places See me again as scheduled

## 2023-01-03 ENCOUNTER — Other Ambulatory Visit: Payer: Self-pay

## 2023-01-03 ENCOUNTER — Telehealth: Payer: Self-pay | Admitting: Psychiatry

## 2023-01-03 ENCOUNTER — Other Ambulatory Visit (HOSPITAL_COMMUNITY): Payer: Self-pay

## 2023-01-03 MED ORDER — AIMOVIG 140 MG/ML ~~LOC~~ SOAJ
140.0000 mg | SUBCUTANEOUS | 3 refills | Status: DC
  Filled 2023-01-03: qty 1, 30d supply, fill #0
  Filled 2023-02-12: qty 1, 30d supply, fill #1

## 2023-01-03 NOTE — Telephone Encounter (Addendum)
Pt notified, will come get another sample. Will prescribe Rx and submit PA for Aimovig with new insurance.   Approvedtoday The request has been approved. The authorization is effective from 01/03/2023 to 07/03/2023

## 2023-01-03 NOTE — Telephone Encounter (Signed)
Contacted pt back, due to [place] not having '140mg'$  instock, she was given '70mg'$ . We have both in stock now, she states she has been having migraines this week. She took the injection this past Sunday Dec 31. Should I provide her another '70MG'$  sample to take now or '140mg'$ ?

## 2023-01-03 NOTE — Telephone Encounter (Signed)
Since she had her last injection so recently I'd give her another 70 mg sample. Then she can go back to 140 mg when she's due for her next injection

## 2023-01-03 NOTE — Telephone Encounter (Signed)
Pt reports that the sample of the Erenumab-aooe (AIMOVIG) that she was given was '70mg'$  not '140mg'$  as a result pt is having migraines.  Pt would like to know what can be done while waiting for the PA to be approved

## 2023-01-03 NOTE — Telephone Encounter (Signed)
PA submitted via CMM for Aimovig '140MG'$   Key: [id number] Approved today The request has been approved. The authorization is effective from 01/03/2023 to 07/03/2023. Pt has been notified

## 2023-01-05 ENCOUNTER — Other Ambulatory Visit: Payer: Self-pay | Admitting: Radiology

## 2023-01-05 DIAGNOSIS — R9389 Abnormal findings on diagnostic imaging of other specified body structures: Secondary | ICD-10-CM

## 2023-01-07 ENCOUNTER — Other Ambulatory Visit (HOSPITAL_COMMUNITY): Payer: Self-pay

## 2023-01-07 ENCOUNTER — Other Ambulatory Visit: Payer: Self-pay

## 2023-01-07 NOTE — Telephone Encounter (Signed)
Med refill request:progesterone 100 mg caps, take one cap OP daily Last AEX: OV NGyN referral for PMB 06/21/22 / [facility] Next AEX: Not scheduled  Last MMG (if hormonal med) 04/11/22, BiRads 1 neg  Refill authorized: Please Advise?

## 2023-01-09 ENCOUNTER — Other Ambulatory Visit (HOSPITAL_COMMUNITY): Payer: Self-pay

## 2023-01-17 ENCOUNTER — Ambulatory Visit (INDEPENDENT_AMBULATORY_CARE_PROVIDER_SITE_OTHER): Payer: Commercial Managed Care - PPO | Admitting: Psychiatry

## 2023-01-17 DIAGNOSIS — F4310 Post-traumatic stress disorder, unspecified: Secondary | ICD-10-CM
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
## 2023-01-29 ENCOUNTER — Other Ambulatory Visit (HOSPITAL_COMMUNITY): Payer: Self-pay

## 2023-01-30 ENCOUNTER — Other Ambulatory Visit (HOSPITAL_COMMUNITY): Payer: Self-pay

## 2023-01-30 DIAGNOSIS — G959 Disease of spinal cord, unspecified: Secondary | ICD-10-CM | POA: Diagnosis not present

## 2023-01-30 MED ORDER — HYDROCODONE-ACETAMINOPHEN 5-325 MG PO TABS
1.0000 | ORAL_TABLET | Freq: Four times a day (QID) | ORAL | 0 refills | Status: AC | PRN
  Filled 2023-01-30: qty 30, 8d supply, fill #0

## 2023-01-31 ENCOUNTER — Ambulatory Visit: Payer: Commercial Managed Care - PPO | Admitting: Internal Medicine

## 2023-01-31 ENCOUNTER — Other Ambulatory Visit (HOSPITAL_COMMUNITY): Payer: Self-pay

## 2023-01-31 ENCOUNTER — Encounter: Payer: Self-pay | Admitting: Internal Medicine

## 2023-01-31 VITALS — BP 110/78 | HR 88 | Temp 98.3°F

## 2023-01-31 DIAGNOSIS — L700 Acne vulgaris: Secondary | ICD-10-CM

## 2023-01-31 DIAGNOSIS — N912 Amenorrhea, unspecified: Secondary | ICD-10-CM | POA: Diagnosis not present

## 2023-01-31 LAB — POCT URINE PREGNANCY: Preg Test, Ur: NEGATIVE

## 2023-01-31 MED ORDER — TRETINOIN 0.025 % EX CREA
TOPICAL_CREAM | Freq: Every day | CUTANEOUS | 2 refills | Status: DC
  Filled 2023-01-31: qty 45, 25d supply, fill #0
  Filled 2023-03-03: qty 45, 25d supply, fill #1

## 2023-01-31 NOTE — Progress Notes (Signed)
Established Patient Office Visit     CC/Reason for Visit: Facial rash  HPI: Barbara Wood is a 53 y.o. female who is coming in today for the above mentioned reasons.  About 3 days ago she noticed the appearance of a facial rash over her cheeks.  She believes this is acne.  She took a dose of Keflex which improved it somewhat.  No new facial products.  She has been menopausal for 2 years.  She is requesting a pregnancy test.   Past Medical/Surgical History: Past Medical History:  Diagnosis Date   Allergy Age 14   Arthritis    shoulders, rt hip   Asthma Since age 75   Depression Age 70   Family history of adverse reaction to anesthesia    mother and sister   GERD (gastroesophageal reflux disease)    Hashimoto's disease    Heart murmur Mitral valve prolapse age 4   Migraine    PONV (postoperative nausea and vomiting)    Pre-diabetes    Psoriasis    Thyroid disease Since age 26    Past Surgical History:  Procedure Laterality Date   BREAST SURGERY  Reduction age 12   CERVICAL [place] ARTHROPLASTY N/A 11/27/2022   Procedure: ARTIFICIAL DISC REPLACEMENT Cervical five-six;  Surgeon: Karsten Ro, DO;  Location: [place];  Service: Neurosurgery;  Laterality: N/A;   CHOLECYSTECTOMY  Age 31   KNEE ARTHROSCOPY W/ LATERAL RELEASE Right    REDUCTION MAMMAPLASTY     WISDOM TOOTH EXTRACTION      Social History:  reports that she has never smoked. She has never used smokeless tobacco. She reports current alcohol use. She reports that she does not use drugs.  Allergies: Allergies  Allergen Reactions   Amoxicillin-Pot Clavulanate Nausea And Vomiting    Augmentin causes nausea and vomiting   Sertraline Other (See Comments)    hallucinations    Sulfamethoxazole-Trimethoprim Hives   Triptans Other (See Comments)    Chest pain     Family History:  Family History  Problem Relation Age of Onset   Asthma Sister    Miscarriages / [place] Sister    Depression Sister     Psoriasis Sister    Cancer Mother    Depression Mother    Hashimoto's thyroiditis Mother    Diabetes Mother    Bipolar disorder Mother    Hypertension Father    Ankylosing spondylitis Father    Psoriasis Father    Psoriasis Brother    Migraines Brother    ADD / ADHD Brother    Eczema Brother    Allergies Brother    Psoriasis Maternal Aunt    Psoriasis Sister    Arthritis Sister        psoriatic arthritis    Psoriasis Sister    Rheum arthritis Sister    Depression Sister    Anxiety disorder Sister    Eczema Sister    Psoriasis Brother    ADD / ADHD Daughter    Eczema Daughter    Asthma Daughter      Current Outpatient Medications:    albuterol (PROAIR HFA) 108 (90 Base) MCG/ACT inhaler, Inhale 2 puffs into the lungs every 6 (six) hours as needed for wheezing or shortness of breath., Disp: 18 g, Rfl: 2   albuterol (VENTOLIN HFA) 108 (90 Base) MCG/ACT inhaler, Inhale 2 puffs into the lungs every 6 (six) hours as needed for wheezing or shortness of breath, Disp: 6.7 g, Rfl: 1  ARIPiprazole (ABILIFY) 10 MG tablet, Take 1 tablet (10 mg total) by mouth daily., Disp: 90 tablet, Rfl: 3   atenolol (TENORMIN) 25 MG tablet, Take 50 mg by mouth daily., Disp: , Rfl:    Baclofen 5 MG TABS, Take 5 mg by mouth 3 (three) times daily as needed (spasms)., Disp: 90 tablet, Rfl: 2   betamethasone dipropionate 0.05 % cream, Apply topically 2 (two) times daily., Disp: 45 g, Rfl: 5   betamethasone dipropionate 0.05 % lotion, Apply 1 application topically to the scalp daily. Taper use as able., Disp: 120 mL, Rfl: 3   Budesonide (RHINOCORT ALLERGY NA), Place 1 spray into both nostrils 2 (two) times daily., Disp: , Rfl:    budesonide-formoterol (SYMBICORT) 80-4.5 MCG/ACT inhaler, Inhale 2 puffs into the lungs 2 (two) times daily as needed., Disp: , Rfl:    cetirizine (ZYRTEC) 10 MG tablet, Take 1 tablet by mouth daily., Disp: , Rfl:    Cholecalciferol (VITAMIN D3) 250 MCG (10000 UT) capsule, Take  10,000 Units by mouth daily., Disp: , Rfl:    conjugated estrogens (PREMARIN) vaginal cream, Place 0.5 g vaginally 3 (three) times a week., Disp: 30 g, Rfl: 12   COVID-19 At Home Antigen Test (CARESTART COVID-19 HOME TEST) KIT, use as directed, Disp: 8 each, Rfl: 0   diclofenac (VOLTAREN) 25 MG EC tablet, Take 75 mg by mouth 2 (two) times daily., Disp: , Rfl:    Erenumab-aooe (AIMOVIG) 140 MG/ML SOAJ, Inject 140 mg into the skin every 30 (thirty) days., Disp: 1 mL, Rfl: 3   HYDROcodone-acetaminophen (NORCO/VICODIN) 5-325 MG tablet, Take 1 tablet by mouth every 6 (six) hours as needed for pain., Disp: 30 tablet, Rfl: 0   levothyroxine (SYNTHROID) 100 MCG tablet, Take 1 tablet (100 mcg total) by mouth daily., Disp: 90 tablet, Rfl: 1   lidocaine (LIDODERM) 5 %, Place 1 patch onto the skin daily. Remove & Discard patch within 12 hours or as directed by MD, Disp: 30 patch, Rfl: 2   liothyronine (CYTOMEL) 5 MCG tablet, Take 1 tablet (5 mcg total) by mouth in the morning, at noon, and at bedtime., Disp: 270 tablet, Rfl: 3   lisdexamfetamine (VYVANSE) 50 MG capsule, Take 1 capsule (50 mg total) by mouth daily., Disp: 30 capsule, Rfl: 0   lisdexamfetamine (VYVANSE) 50 MG capsule, Take 1 capsule (50 mg total) by mouth daily., Disp: 30 capsule, Rfl: 0   lisdexamfetamine (VYVANSE) 50 MG capsule, Take 1 capsule (50 mg total) by mouth daily., Disp: 30 capsule, Rfl: 0   metaxalone (SKELAXIN) 800 MG tablet, Take 1 tablet (800 mg total) by mouth 3 (three) times daily., Disp: 90 tablet, Rfl: 1   montelukast (SINGULAIR) 10 MG tablet, Take 1 tablet (10 mg total) by mouth at bedtime., Disp: 90 tablet, Rfl: 3   omeprazole (PRILOSEC) 20 MG capsule, Take 1 capsule (20 mg total) by mouth daily., Disp: 30 capsule, Rfl: 3   progesterone (PROMETRIUM) 100 MG capsule, TAKE 1 CAPSULE BY MOUTH EVERY DAY, Disp: 90 capsule, Rfl: 0   promethazine (PHENERGAN) 25 MG tablet, Take 1 tablet (25 mg total) by mouth every 6 (six) hours as  needed for nausea or vomiting., Disp: 30 tablet, Rfl: 3   Rimegepant Sulfate ([name]) 75 MG TBDP, Take 1 tablet by mouth daily as needed., Disp: 4 tablet, Rfl: 0   Rimegepant Sulfate ([name]) 75 MG TBDP, Take 1 tablet by mouth daily as needed. (Patient taking differently: Take 75 tablets by mouth daily as needed (migraine).), Disp:  16 tablet, Rfl: 2   traMADol (ULTRAM) 50 MG tablet, Take 2 tablets (100 mg total) by mouth 2 (two) times daily., Disp: 360 tablet, Rfl: 0   traZODone (DESYREL) 50 MG tablet, Take 1-2 tablets (50-100 mg total) by mouth at bedtime as needed for sleep., Disp: 60 tablet, Rfl: 1   tretinoin (RETIN-A) 0.025 % cream, Apply topically at bedtime., Disp: 45 g, Rfl: 2  Review of Systems:  Negative unless indicated in HPI.   Physical Exam: Vitals:   01/31/23 1529  BP: 110/78  Pulse: 88  Temp: 98.3 F (36.8 C)  TempSrc: Oral  SpO2: 98%    There is no height or weight on file to calculate BMI.   Physical Exam Vitals reviewed.  Constitutional:      Appearance: Normal appearance.  HENT:     Head: Normocephalic and atraumatic.  Eyes:     Conjunctiva/sclera: Conjunctivae normal.  Skin:    General: Skin is warm and dry.     Comments: Mild comedonal acne over cheeks  Neurological:     General: No focal deficit present.     Mental Status: She is alert and oriented to person, place, and time.  Psychiatric:        Mood and Affect: Mood normal.        Behavior: Behavior normal.        Thought Content: Thought content normal.        Judgment: Judgment normal.      Impression and Plan:  Amenorrhea - Plan: POCT urine pregnancy  Comedonal acne - Plan: tretinoin (RETIN-A) 0.025 % cream  -We have discussed using benzoyl peroxide/salicylic acid containing facial cleansers morning and night, I will also prescribe tretinoin for her to start out with twice a week with a goal to increase to nightly if tolerated. -Urine pregnancy test done per request is  negative.  Time spent:30 minutes reviewing chart, interviewing and examining patient and formulating plan of care.     Lelon Frohlich, MD [facility]

## 2023-02-01 ENCOUNTER — Other Ambulatory Visit (HOSPITAL_COMMUNITY): Payer: Self-pay

## 2023-02-05 NOTE — Progress Notes (Unsigned)
Barbara Wood [facility] [place] [address] [place] Phone: [phone] Subjective:   IVilma Wood, am serving as a scribe for Dr. Hulan Saas.  I'm seeing this patient by the request  of:  [name], MD  CC: Left shoulder pain  ZWC:HENIDPOEUM  [name] is a 53 y.o. female coming in with complaint of back and neck pain. OMT on 01/02/2023. Injected the R shoulder, but wants L shoulder injection.  Patient did have an effusion of the left [facility] joint previously in October that was given an injection.  Patient has also had a bursitis of the shoulder previously.  Past medical history significant for aNeck surgery in November patient states wants to evaluate L shoulder and R hip to see if injection is needed. Is allowed to manipulate down through thoracic.  Medications patient has been prescribed:   Taking:         Reviewed prior external information including notes and imaging from previsou exam, outside providers and external EMR if available.   As well as notes that were available from care everywhere and other healthcare systems.  Past medical history, social, surgical and family history all reviewed in electronic medical record.  No pertanent information unless stated regarding to the chief complaint.   Past Medical History:  Diagnosis Date   Allergy Age 5   Arthritis    shoulders, rt hip   Asthma Since age 60   Depression Age 70   Family history of adverse reaction to anesthesia    mother and sister   GERD (gastroesophageal reflux disease)    Hashimoto's disease    Heart murmur Mitral valve prolapse age 68   Migraine    PONV (postoperative nausea and vomiting)    Pre-diabetes    Psoriasis    Thyroid disease Since age 52    Allergies  Allergen Reactions   Amoxicillin-Pot Clavulanate Nausea And Vomiting    Augmentin causes nausea and vomiting   Sertraline Other (See Comments)    hallucinations     Sulfamethoxazole-Trimethoprim Hives   Triptans Other (See Comments)    Chest pain      Review of Systems:  No headache, visual changes, nausea, vomiting, diarrhea, constipation, dizziness, abdominal pain, skin rash, fevers, chills, night sweats, weight loss, swollen lymph nodes, body aches, joint swelling, chest pain, shortness of breath, mood changes. POSITIVE muscle aches  Objective  Blood pressure 126/82, pulse 80, height '5\' 7"'$  (1.702 m), weight 200 lb (90.7 kg), last menstrual period 10/31/2019, SpO2 98 %.   General: No apparent distress alert and oriented x3 mood and affect normal, dressed appropriately.  HEENT: Pupils equal, extraocular movements intact  Respiratory: Patient's speak in full sentences and does not appear short of breath  Cardiovascular: No lower extremity edema, non tender, no erythema  Patient does have tenderness over the acromioclavicular joint.  Positive crossover. 5 out of 5 strength of the rotator cuff.  Good range of motion. Tender to palpation more over the greater trochanteric area than over the gluteal tendon previous exam.  Patient does have tightness noted with FABER test.  Still has hypermobility of multiple joints.  Procedure: Real-time Ultrasound Guided Injection of right greater trochanteric bursitis secondary to patient's body habitus Device: GE Logiq Q7 Ultrasound guided injection is preferred based studies that show increased duration, increased effect, greater accuracy, decreased procedural pain, increased response rate, and decreased cost with ultrasound guided versus blind injection.  Verbal informed consent obtained.  Time-out conducted.  Noted no overlying erythema, induration, or other signs of local infection.  Skin prepped in a sterile fashion.  Local anesthesia: Topical Ethyl chloride.  With sterile technique and under real time ultrasound guidance:  Greater trochanteric area was visualized and patient's bursa was noted. A 22-gauge 3 inch  needle was inserted and 4 cc of 0.5% Marcaine and 1 cc of Kenalog 40 mg/dL was injected. Pictures taken Completed without difficulty  Pain immediately resolved suggesting accurate placement of the medication.  Advised to call if fevers/chills, erythema, induration, drainage, or persistent bleeding.  Impression: Technically successful ultrasound guided injection.  Procedure: Real-time Ultrasound Guided Injection of left acromioclavicular joint Device: GE Logiq Q7 Ultrasound guided injection is preferred based studies that show increased duration, increased effect, greater accuracy, decreased procedural pain, increased response rate, and decreased cost with ultrasound guided versus blind injection.  Verbal informed consent obtained.  Time-out conducted.  Noted no overlying erythema, induration, or other signs of local infection.  Skin prepped in a sterile fashion.  Local anesthesia: Topical Ethyl chloride.  With sterile technique and under real time ultrasound guidance: With a 25-gauge half inch needle injected with 0.5 cc of 0.5% Marcaine and 0.5 cc of Kenalog 40 mg per Completed without difficulty  Pain immediately resolved suggesting accurate placement of the medication.  Advised to call if fevers/chills, erythema, induration, drainage, or persistent bleeding.  Impression: Technically successful ultrasound guided injection.  Osteopathic findings  T9 extended rotated and side bent left inhaled rib L2 flexed rotated and side bent right Sacrum right on right       Assessment and Plan:  Effusion of acromioclavicular joint, left Patient given another injection today.  Tolerated the procedure well.  We discussed with patient that this was 4 months since previous injection.  Discussed icing regimen and home exercises, which activities to do and which ones to avoid.  Increase activity slowly.  Follow-up again in 6 to 8 weeks  Greater trochanteric bursitis of right hip Patient given  injection and tolerated the procedure well, discussed icing regimen and home exercises, which activities to do and which ones to avoid.  Increase activity slowly.  Follow-up again 6 to 8 weeks    Nonallopathic problems  Decision today to treat with OMT was based on Physical Exam  After verbal consent patient was treated with HVLA, ME, FPR techniques in rib, thoracic, lumbar, and sacral  areas  Patient tolerated the procedure well with improvement in symptoms  Patient given exercises, stretches and lifestyle modifications  See medications in patient instructions if given  Patient will follow up in 4-8 weeks     The above documentation has been reviewed and is accurate and complete Lyndal Pulley, DO         Note: This dictation was prepared with Dragon dictation along with smaller phrase technology. Any transcriptional errors that result from this process are unintentional.

## 2023-02-06 ENCOUNTER — Ambulatory Visit: Payer: Commercial Managed Care - PPO | Admitting: Family Medicine

## 2023-02-06 ENCOUNTER — Other Ambulatory Visit: Payer: Self-pay | Admitting: Internal Medicine

## 2023-02-06 ENCOUNTER — Ambulatory Visit: Payer: Self-pay

## 2023-02-06 VITALS — BP 126/82 | HR 80 | Ht 67.0 in | Wt 200.0 lb

## 2023-02-06 DIAGNOSIS — M25512 Pain in left shoulder: Secondary | ICD-10-CM | POA: Diagnosis not present

## 2023-02-06 DIAGNOSIS — M25412 Effusion, left shoulder: Secondary | ICD-10-CM | POA: Diagnosis not present

## 2023-02-06 DIAGNOSIS — M7918 Myalgia, other site: Secondary | ICD-10-CM | POA: Diagnosis not present

## 2023-02-06 DIAGNOSIS — M9902 Segmental and somatic dysfunction of thoracic region: Secondary | ICD-10-CM

## 2023-02-06 DIAGNOSIS — M7061 Trochanteric bursitis, right hip: Secondary | ICD-10-CM | POA: Diagnosis not present

## 2023-02-06 DIAGNOSIS — M9904 Segmental and somatic dysfunction of sacral region: Secondary | ICD-10-CM

## 2023-02-06 DIAGNOSIS — M9903 Segmental and somatic dysfunction of lumbar region: Secondary | ICD-10-CM

## 2023-02-06 NOTE — Assessment & Plan Note (Signed)
Continues to have some myofascial pain.  Discussed with patient that some of the hypermobility that we will continue to monitor.  Discussed icing regimen and home exercises, which activities to do and which ones to avoid.  Patient is trying to be active with an increase in muscle strength which I think will be beneficial.  Will follow-up with me again in 6 to 8 weeks

## 2023-02-06 NOTE — Patient Instructions (Addendum)
Injection in L shoulder and R hip Good to see you! Keep hands within peripheral vision See you again in 6-7 weeks

## 2023-02-06 NOTE — Progress Notes (Unsigned)
   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  At her last visit, MRI brain and C-spine were ordered. She was started on Aimovig for migraine prevention and Ubrelvy for rescue. Interval History:  MRI brain 11/07/22 was unremarkable.   MRI C-spine showed moderate spinal stenosis and moderate-severe bilateral foraminal stenosis at C5-6. She underwent a C5-6 total disc arthroplasty on 11/27/22. Since then, her neck pain has improved. She still has some spasms in her neck. She takes baclofen twice a day.  While she has had some improvement with Aimovig, she continues to have 2-3 migraines per week. She has had 3 injections of Aimovig so far. Roselyn Meier was ineffective so she switched back to [name] for rescue.   Migraine days per month: 8 Headache free days per month: 12  Current Headache Regimen: Preventative: Aimovig 140 mg monthly Abortive: [name] 75 mg PRN   Prior Therapies                                  Preventive: Topamax 100 mg PRN - word finding difficulty Keppra Atenolol 100 mg BID - lack of efficacy Verapamil - worsened migraines Amitriptyline Cymbalta - nausea and vomiting Lyrica - vertigo Emgality 120 mg monthly Aimovig 140 mg monthly Botox   Rescue: Imitrex - palpitations 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 [name] 75 mg PRN Ubrelvy 100 mg PRN - lack of efficacy Skelaxin Phergan Norco    Physical Exam:   Vital Signs: LMP 10/31/2019  GENERAL:  well appearing, in no acute distress, alert  SKIN:  Color, texture, turgor normal. No rashes or lesions HEAD:  Normocephalic/atraumatic. RESP: normal respiratory effort MSK:  No gross joint deformities.   NEUROLOGICAL: Mental Status: Alert, oriented to person, place and time, Follows commands, and  Speech fluent and appropriate. Cranial Nerves: PERRL, face symmetric, no dysarthria, hearing grossly intact Motor: moves all extremities equally Gait: normal-based.  IMPRESSION: 53 year old female with a history of Hashimoto's disease, cervical radiculopathy s/p C5-6 disc arthroplasty who presents for follow up of hemiplegic migraines. While she has had some improvement with Aimovig, she continues to have up to 12 migraine days per month. Will switch to [facility] for migraine prevention and continue [name] for rescue.  PLAN: -Prevention: Start Vyepti '100mg'$  PRN -Rescue: Continue [name] 75 mg PRN Next steps: Consider Reyvow for rescue, consider Ajovy or qulipta for prevention  Follow-up: 6 months  I spent a total of 31 minutes on the date of the service. Headache education was done. Discussed treatment options including preventive and acute medications. Discussed medication side effects, adverse reactions and drug interactions. Written educational materials and patient instructions outlining all of the above were given.  Genia Harold, MD 02/07/23 12:10 PM

## 2023-02-06 NOTE — Assessment & Plan Note (Signed)
Patient given another injection today.  Tolerated the procedure well.  We discussed with patient that this was 4 months since previous injection.  Discussed icing regimen and home exercises, which activities to do and which ones to avoid.  Increase activity slowly.  Follow-up again in 6 to 8 weeks

## 2023-02-06 NOTE — Assessment & Plan Note (Signed)
Patient given injection and tolerated the procedure well, discussed icing regimen and home exercises, which activities to do and which ones to avoid.  Increase activity slowly.  Follow-up again 6 to 8 weeks

## 2023-02-07 ENCOUNTER — Other Ambulatory Visit (HOSPITAL_COMMUNITY): Payer: Self-pay

## 2023-02-07 ENCOUNTER — Ambulatory Visit: Payer: Commercial Managed Care - PPO | Admitting: Psychiatry

## 2023-02-07 VITALS — BP 126/79 | HR 82 | Ht 67.0 in | Wt 198.8 lb

## 2023-02-07 DIAGNOSIS — G43E19 Chronic migraine with aura, intractable, without status migrainosus: Secondary | ICD-10-CM

## 2023-02-07 MED ORDER — MONTELUKAST SODIUM 10 MG PO TABS
10.0000 mg | ORAL_TABLET | Freq: Every day | ORAL | 3 refills | Status: DC
  Filled 2023-02-07: qty 90, 90d supply, fill #0
  Filled 2023-05-07: qty 90, 90d supply, fill #1
  Filled 2023-08-05: qty 90, 90d supply, fill #2
  Filled 2023-10-30: qty 90, 90d supply, fill #3

## 2023-02-11 ENCOUNTER — Other Ambulatory Visit: Payer: Self-pay | Admitting: Internal Medicine

## 2023-02-11 DIAGNOSIS — G4700 Insomnia, unspecified: Secondary | ICD-10-CM

## 2023-02-12 ENCOUNTER — Telehealth: Payer: Self-pay | Admitting: *Deleted

## 2023-02-12 ENCOUNTER — Other Ambulatory Visit: Payer: Self-pay

## 2023-02-12 ENCOUNTER — Other Ambulatory Visit (HOSPITAL_COMMUNITY): Payer: Self-pay

## 2023-02-12 MED ORDER — TRAZODONE HCL 50 MG PO TABS
50.0000 mg | ORAL_TABLET | Freq: Every evening | ORAL | 1 refills | Status: DC | PRN
  Filled 2023-02-12: qty 60, 30d supply, fill #0
  Filled 2023-03-24: qty 60, 30d supply, fill #1

## 2023-02-12 NOTE — Telephone Encounter (Signed)
Dr.Chima signed order for pt to have Vyepti infusion here at [facility] infusion suite. Order given to [facility] in infusion suite to proceed.

## 2023-02-13 ENCOUNTER — Other Ambulatory Visit (HOSPITAL_COMMUNITY): Payer: Self-pay

## 2023-02-14 ENCOUNTER — Other Ambulatory Visit (HOSPITAL_COMMUNITY): Payer: Self-pay

## 2023-02-19 ENCOUNTER — Other Ambulatory Visit: Payer: Self-pay | Admitting: Internal Medicine

## 2023-02-19 DIAGNOSIS — U071 COVID-19: Secondary | ICD-10-CM

## 2023-02-19 MED ORDER — PREDNISONE 10 MG (21) PO TBPK: ORAL_TABLET | ORAL | 0 refills | Status: DC

## 2023-02-20 ENCOUNTER — Telehealth: Payer: Self-pay | Admitting: *Deleted

## 2023-02-20 NOTE — Telephone Encounter (Signed)
Prior Barbara Wood has been started for [name] 75 mg. Key: [id number]

## 2023-02-21 ENCOUNTER — Ambulatory Visit: Payer: Commercial Managed Care - PPO | Admitting: Internal Medicine

## 2023-02-21 ENCOUNTER — Encounter: Payer: Self-pay | Admitting: Internal Medicine

## 2023-02-21 ENCOUNTER — Ambulatory Visit (INDEPENDENT_AMBULATORY_CARE_PROVIDER_SITE_OTHER): Payer: Commercial Managed Care - PPO | Admitting: Psychiatry

## 2023-02-21 VITALS — BP 110/80 | HR 95 | Temp 98.7°F

## 2023-02-21 DIAGNOSIS — F4310 Post-traumatic stress disorder, unspecified: Secondary | ICD-10-CM | POA: Diagnosis not present

## 2023-02-21 DIAGNOSIS — U071 COVID-19: Secondary | ICD-10-CM | POA: Diagnosis not present

## 2023-02-21 NOTE — Progress Notes (Signed)
Established Patient Office Visit     CC/Reason for Visit: COVID, shortness of breath, cough  HPI: Barbara Wood is a 53 y.o. female who is coming in today for the above mentioned reasons.  She started having COVID symptoms on Friday.  She has been dealing with health at work.  Her 5-day quarantine has now expired.  She is still having significant cough, shortness of breath with exertion.  I did send in a prednisone taper earlier this week.  She has been using cough syrup that she had previously.  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  Having significant dyspnea on exertion and tachycardia still.  Paxlovid was not prescribed due to multiple medication contraindications.   Past Medical/Surgical History: Past Medical History:  Diagnosis Date   Allergy Age 64   Arthritis    shoulders, rt hip   Asthma Since age 25   Depression Age 56   Family history of adverse reaction to anesthesia    mother and sister   GERD (gastroesophageal reflux disease)    Hashimoto's disease    Heart murmur Mitral valve prolapse age 89   Migraine    PONV (postoperative nausea and vomiting)    Pre-diabetes    Psoriasis    Thyroid disease Since age 66    Past Surgical History:  Procedure Laterality Date   BREAST SURGERY  Reduction age 36   CERVICAL [place] ARTHROPLASTY N/A 11/27/2022   Procedure: ARTIFICIAL DISC REPLACEMENT Cervical five-six;  Surgeon: Karsten Ro, DO;  Location: [place];  Service: Neurosurgery;  Laterality: N/A;   CHOLECYSTECTOMY  Age 44   KNEE ARTHROSCOPY W/ LATERAL RELEASE Right    REDUCTION MAMMAPLASTY     WISDOM TOOTH EXTRACTION      Social History:  reports that she has never smoked. She has never used smokeless tobacco. She reports current alcohol use. She reports that she does not use drugs.  Allergies: Allergies  Allergen Reactions   Amoxicillin-Pot Clavulanate Nausea And Vomiting    Augmentin causes nausea and vomiting    Sertraline Other (See Comments)    hallucinations    Sulfamethoxazole-Trimethoprim Hives   Triptans Other (See Comments)    Chest pain     Family History:  Family History  Problem Relation Age of Onset   Asthma Sister    Miscarriages / [place] Sister    Depression Sister    Psoriasis Sister    Cancer Mother    Depression Mother    Hashimoto's thyroiditis Mother    Diabetes Mother    Bipolar disorder Mother    Hypertension Father    Ankylosing spondylitis Father    Psoriasis Father    Psoriasis Brother    Migraines Brother    ADD / ADHD Brother    Eczema Brother    Allergies Brother    Psoriasis Maternal Aunt    Psoriasis Sister    Arthritis Sister        psoriatic arthritis    Psoriasis Sister    Rheum arthritis Sister    Depression Sister    Anxiety disorder Sister    Eczema Sister    Psoriasis Brother    ADD / ADHD Daughter    Eczema Daughter    Asthma Daughter      Current Outpatient Medications:    predniSONE (STERAPRED UNI-PAK 21 TAB) 10 MG (21) TBPK tablet, Take as directed, Disp: 21 tablet, Rfl: 0   albuterol (PROAIR HFA) 108 (90  Base) MCG/ACT inhaler, Inhale 2 puffs into the lungs every 6 (six) hours as needed for wheezing or shortness of breath., Disp: 18 g, Rfl: 2   albuterol (VENTOLIN HFA) 108 (90 Base) MCG/ACT inhaler, Inhale 2 puffs into the lungs every 6 (six) hours as needed for wheezing or shortness of breath, Disp: 6.7 g, Rfl: 1   ARIPiprazole (ABILIFY) 10 MG tablet, Take 1 tablet (10 mg total) by mouth daily., Disp: 90 tablet, Rfl: 3   atenolol (TENORMIN) 25 MG tablet, Take 50 mg by mouth daily., Disp: , Rfl:    Baclofen 5 MG TABS, Take 5 mg by mouth 3 (three) times daily as needed (spasms)., Disp: 90 tablet, Rfl: 2   betamethasone dipropionate 0.05 % cream, Apply topically 2 (two) times daily., Disp: 45 g, Rfl: 5   betamethasone dipropionate 0.05 % lotion, Apply 1 application topically to the scalp daily. Taper use as able., Disp: 120 mL,  Rfl: 3   Budesonide (RHINOCORT ALLERGY NA), Place 1 spray into both nostrils 2 (two) times daily., Disp: , Rfl:    budesonide-formoterol (SYMBICORT) 80-4.5 MCG/ACT inhaler, Inhale 2 puffs into the lungs 2 (two) times daily as needed., Disp: , Rfl:    cetirizine (ZYRTEC) 10 MG tablet, Take 1 tablet by mouth daily., Disp: , Rfl:    Cholecalciferol (VITAMIN D3) 250 MCG (10000 UT) capsule, Take 10,000 Units by mouth daily., Disp: , Rfl:    conjugated estrogens (PREMARIN) vaginal cream, Place 0.5 g vaginally 3 (three) times a week., Disp: 30 g, Rfl: 12   COVID-19 At Home Antigen Test (CARESTART COVID-19 HOME TEST) KIT, use as directed, Disp: 8 each, Rfl: 0   diclofenac (VOLTAREN) 25 MG EC tablet, Take 75 mg by mouth 2 (two) times daily., Disp: , Rfl:    Erenumab-aooe (AIMOVIG) 140 MG/ML SOAJ, Inject 140 mg into the skin every 30 (thirty) days., Disp: 1 mL, Rfl: 3   HYDROcodone-acetaminophen (NORCO/VICODIN) 5-325 MG tablet, Take 1 tablet by mouth every 6 (six) hours as needed for pain., Disp: 30 tablet, Rfl: 0   levothyroxine (SYNTHROID) 100 MCG tablet, Take 1 tablet (100 mcg total) by mouth daily., Disp: 90 tablet, Rfl: 1   lidocaine (LIDODERM) 5 %, Place 1 patch onto the skin daily. Remove & Discard patch within 12 hours or as directed by MD, Disp: 30 patch, Rfl: 2   liothyronine (CYTOMEL) 5 MCG tablet, Take 1 tablet (5 mcg total) by mouth in the morning, at noon, and at bedtime., Disp: 270 tablet, Rfl: 3   lisdexamfetamine (VYVANSE) 50 MG capsule, Take 1 capsule (50 mg total) by mouth daily., Disp: 30 capsule, Rfl: 0   lisdexamfetamine (VYVANSE) 50 MG capsule, Take 1 capsule (50 mg total) by mouth daily., Disp: 30 capsule, Rfl: 0   lisdexamfetamine (VYVANSE) 50 MG capsule, Take 1 capsule (50 mg total) by mouth daily., Disp: 30 capsule, Rfl: 0   metaxalone (SKELAXIN) 800 MG tablet, Take 1 tablet (800 mg total) by mouth 3 (three) times daily., Disp: 90 tablet, Rfl: 1   montelukast (SINGULAIR) 10 MG  tablet, Take 1 tablet (10 mg total) by mouth at bedtime., Disp: 90 tablet, Rfl: 3   omeprazole (PRILOSEC) 20 MG capsule, Take 1 capsule (20 mg total) by mouth daily., Disp: 30 capsule, Rfl: 3   progesterone (PROMETRIUM) 100 MG capsule, TAKE 1 CAPSULE BY MOUTH EVERY DAY, Disp: 90 capsule, Rfl: 0   promethazine (PHENERGAN) 25 MG tablet, Take 1 tablet (25 mg total) by mouth every 6 (six) hours  as needed for nausea or vomiting., Disp: 30 tablet, Rfl: 3   Rimegepant Sulfate ([name]) 75 MG TBDP, Take 1 tablet by mouth daily as needed., Disp: 4 tablet, Rfl: 0   Rimegepant Sulfate ([name]) 75 MG TBDP, Take 1 tablet by mouth daily as needed. (Patient taking differently: Take 75 tablets by mouth daily as needed (migraine).), Disp: 16 tablet, Rfl: 2   traMADol (ULTRAM) 50 MG tablet, Take 2 tablets (100 mg total) by mouth 2 (two) times daily., Disp: 360 tablet, Rfl: 0   traZODone (DESYREL) 50 MG tablet, Take 1-2 tablets (50-100 mg total) by mouth at bedtime as needed for sleep., Disp: 60 tablet, Rfl: 1   tretinoin (RETIN-A) 0.025 % cream, Apply topically at bedtime., Disp: 45 g, Rfl: 2  Review of Systems:  Negative unless indicated in HPI.   Physical Exam: There were no vitals filed for this visit.  There is no height or weight on file to calculate BMI.   Physical Exam Vitals reviewed.  Constitutional:      Appearance: Normal appearance.  HENT:     Right Ear: Tympanic membrane, ear canal and external ear normal.     Left Ear: Tympanic membrane, ear canal and external ear normal.     Mouth/Throat:     Mouth: Mucous membranes are moist.     Pharynx: Oropharynx is clear.  Eyes:     Conjunctiva/sclera: Conjunctivae normal.     Pupils: Pupils are equal, round, and reactive to light.  Cardiovascular:     Rate and Rhythm: Regular rhythm. Tachycardia present.  Pulmonary:     Effort: Pulmonary effort is normal.     Breath sounds: Rales present.  Neurological:     Mental Status: She is alert.       Impression and Plan:  [id number]  -Because of her history of asthma I suspect her COVID will take a little bit longer to resolve.  No evidence to suggest superimposed pneumonia on today's exam.  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  She will notify me by the end of the day how she is feeling to decide if she will be working, if not I would be more than happy to provide a work note. -She is to complete prednisone taper that has already been prescribed, she has cough syrup at home that she can use if needed.  Time spent:30 minutes reviewing chart, interviewing and examining patient and formulating plan of care.     Lelon Frohlich, MD [facility]

## 2023-02-21 NOTE — Progress Notes (Signed)
[facility] Counselor/Therapist Progress Note  Patient ID: Warner Vandersteen, MRN: [id number],    Date: 02/21/2023  Time Spent: 45 minutes start time 8:10 AM end time 8:55 AM  Treatment Type: Individual Therapy  Reported Symptoms: Anxiety, triggered response, sadness  Mental Status Exam:  Appearance:   Well Groomed     Behavior:  Appropriate  Motor:  Normal  Speech/Language:   Normal Rate  Affect:  Appropriate  Mood:  normal  Thought process:  normal  Thought content:    WNL  Sensory/Perceptual disturbances:    WNL  Orientation:  oriented to person, place, time/date, and situation  Attention:  Good  Concentration:  Good  Memory:  WNL  Fund of knowledge:   Good  Insight:    Good  Judgment:   Good  Impulse Control:  Good   Risk Assessment: Danger to Self:  No Self-injurious Behavior: No Danger to Others: No Duty to Warn:no Physical Aggression / Violence:No  Access to Firearms a concern: No  Gang Involvement:No   Subjective: Patient was present for session.  She shared that she had decided to break things off with her boyfriend for a while.  She shared that while he was visiting he became very upset with her daughter and was yelling at patient and saying things that were hurtful regarding her daughter.  Patient stated she was able to state that she was not comfortable with him being around if he is going to say mean things and cannot maintain his emotions appropriately.  Patient shared that he still wants to reconcile but she is trying to take things one step at a time.  Patient was encouraged to think through what she would want for things to work out and was encouraged to put things back on him so she does not feel the pressure of having to make a decision about their future.  She was given a handout on different questions that she could ask him to help him realize that she is not good to do things the same and he will need to change if they are to reconcile.  She was also  given handouts on how to manage anger and emotions appropriately.  She was encouraged to watch some DBT skills videos with her daughter and to practice them together.  Patient was able to recognize that she did not have the fear of abandonment that she has had in the past that would have caused her to stay in an unhealthy relationship.  She shares she feels the EMDR has been very beneficial and as recognized changes in her choices in a positive direction.  Patient was encouraged to continue using coping skills and follow through with plans to work on her self-care and recognizing that she does not have to make a decision at this time she can give herself the opportunity to get more information and then make the best decision for she and her daughter.  Interventions: Solution-Oriented/Positive Psychology  Diagnosis:   ICD-10-CM   1. PTSD (post-traumatic stress disorder)  F43.10       Plan: Patient is to use coping skills to decrease triggered responses.  Patient is to follow-up plans from session to ask her boyfriend questions regarding how he plans to change things in the future or how he wants to do things to be in the future before making a decision about their future.  Patient is to work on diaphragmatic breathing safe place exercise and stretching/yoga throughout the day.  Patient  is to focus on what she can control fix and change.  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  Patient is to allow processing to continue between sessions .  Patient is to continue working on limit setting with daughter and ex-husband.  Patient is to release negative emotions through exercise  Long-term goal: Develop healthy boundaries Short-term goal: Resolve the trauma from past Give her daughter more stability Trying to navigate divorce and have good boundaries Deal with childhood trauma Figure out relationship with [name], [facility]

## 2023-02-22 ENCOUNTER — Other Ambulatory Visit (HOSPITAL_COMMUNITY): Payer: Self-pay

## 2023-02-26 ENCOUNTER — Other Ambulatory Visit (HOSPITAL_COMMUNITY): Payer: Self-pay

## 2023-02-26 ENCOUNTER — Other Ambulatory Visit: Payer: Self-pay | Admitting: Internal Medicine

## 2023-02-26 DIAGNOSIS — G43909 Migraine, unspecified, not intractable, without status migrainosus: Secondary | ICD-10-CM

## 2023-02-26 MED ORDER — NURTEC 75 MG PO TBDP
1.0000 | ORAL_TABLET | Freq: Every day | ORAL | 3 refills | Status: DC | PRN
  Filled 2023-02-26: qty 8, 30d supply, fill #0
  Filled 2023-03-12: qty 8, 8d supply, fill #0

## 2023-02-26 NOTE — Telephone Encounter (Signed)
PA was denied

## 2023-03-03 ENCOUNTER — Other Ambulatory Visit (HOSPITAL_COMMUNITY): Payer: Self-pay

## 2023-03-04 ENCOUNTER — Other Ambulatory Visit (HOSPITAL_COMMUNITY): Payer: Self-pay

## 2023-03-04 MED ORDER — BACLOFEN 5 MG PO TABS
5.0000 mg | ORAL_TABLET | Freq: Two times a day (BID) | ORAL | 3 refills | Status: DC | PRN
  Filled 2023-03-04 – 2023-12-26 (×2): qty 60, 30d supply, fill #0

## 2023-03-05 ENCOUNTER — Other Ambulatory Visit (HOSPITAL_COMMUNITY): Payer: Self-pay

## 2023-03-05 ENCOUNTER — Other Ambulatory Visit: Payer: Self-pay

## 2023-03-05 ENCOUNTER — Other Ambulatory Visit: Payer: Self-pay | Admitting: Internal Medicine

## 2023-03-05 ENCOUNTER — Ambulatory Visit (INDEPENDENT_AMBULATORY_CARE_PROVIDER_SITE_OTHER): Payer: Commercial Managed Care - PPO | Admitting: Psychiatry

## 2023-03-05 DIAGNOSIS — F4310 Post-traumatic stress disorder, unspecified: Secondary | ICD-10-CM | POA: Diagnosis not present

## 2023-03-05 DIAGNOSIS — F988 Other specified behavioral and emotional disorders with onset usually occurring in childhood and adolescence: Secondary | ICD-10-CM

## 2023-03-05 MED ORDER — LISDEXAMFETAMINE DIMESYLATE 50 MG PO CAPS
50.0000 mg | ORAL_CAPSULE | Freq: Every day | ORAL | 0 refills | Status: DC
  Filled 2023-03-05: qty 30, 30d supply, fill #0

## 2023-03-05 MED ORDER — LISDEXAMFETAMINE DIMESYLATE 50 MG PO CAPS
50.0000 mg | ORAL_CAPSULE | Freq: Every day | ORAL | 0 refills | Status: DC
  Filled 2023-03-05 – 2023-04-03 (×2): qty 30, 30d supply, fill #0

## 2023-03-05 NOTE — Addendum Note (Signed)
Addended by: Westley Hummer B on: 03/05/2023 03:13 PM   Modules accepted: Orders

## 2023-03-05 NOTE — Telephone Encounter (Signed)
Message sent to [facility] in infusion suite to call and follow up with patient.Marland Kitchen

## 2023-03-05 NOTE — Progress Notes (Signed)
      [facility] Counselor/Therapist Progress Note  Patient ID: Barbara Wood, MRN: [id number],    Date: 03/05/2023  Time Spent: 50 minutes start time 2:07 PM end time 2:57 PM  Treatment Type: Individual Therapy  Reported Symptoms: focusing issues, sadness, triggered responses  Mental Status Exam:  Appearance:   Well Groomed     Behavior:  Appropriate  Motor:  Normal  Speech/Language:   Normal Rate  Affect:  Appropriate  Mood:  normal  Thought process:  normal  Thought content:    WNL  Sensory/Perceptual disturbances:    WNL  Orientation:  oriented to person, place, time/date, and situation  Attention:  Good  Concentration:  Good  Memory:  WNL  Fund of knowledge:   Good  Insight:    Good  Judgment:   Good  Impulse Control:  Good   Risk Assessment: Danger to Self:  No Self-injurious Behavior: No Danger to Others: No Duty to Warn:no Physical Aggression / Violence:No  Access to Firearms a concern: No  Gang Involvement:No   Subjective: Patient was present for session. She shared she was able to end the relationship with [name] which was progress. She went on to share that she did get  triggered by her daughter due to lying to her again.  Patient was able to recognize that some of her daughters mannerisms remind her of her sister who has lots of issues and patient has had to cut off contact with currently.  Patient was able to share what happened with her daughter discussed different parenting methods to help her daughter be able to think through the choices of her behaviors and realize consequences that may occur due to them.  Patient was able to develop some plans that she felt would be helpful in processing her daughter's choices with her.  Patient was encouraged to feel good about her choice regarding ending the relationship with Hilliard Clark and recognizing that that was one goal achieved.  The importance of her continuing to communicate with her ex and trying to have things as  consistent as possible with her daughter was addressed as well.  Interventions: Solution-Oriented/Positive Psychology  Diagnosis:   ICD-10-CM   1. PTSD (post-traumatic stress disorder)  F43.10       Plan:  Patient is to use coping skills to decrease triggered responses.  Patient is to follow-up plans from session to help her daughter be able to think through food choices and consequences of those choices appropriately..  Patient is to work on diaphragmatic breathing safe place exercise and stretching/yoga throughout the day.  Patient is to focus on what she can control fix and change.  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  Patient is to allow processing to continue between sessions .  Patient is to continue working on limit setting with daughter and ex-husband.  Patient is to release negative emotions through exercise  Long-term goal: Develop healthy boundaries Short-term goal: Resolve the trauma from past Give her daughter more stability Trying to navigate divorce and have good boundaries Deal with childhood trauma Figure out relationship with [name] -goal met  [name], [facility]

## 2023-03-05 NOTE — Telephone Encounter (Signed)
Prescription Request  03/05/2023  LOV: 02/21/2023  What is the name of the medication or equipment? lisdexamfetamine (VYVANSE) 50 MG capsule   Have you contacted your pharmacy to request a refill? No   Which pharmacy would you like this sent to?  [facility] [address] [place] [address] Phone: [phone] Fax: [phone]     Patient notified that their request is being sent to the clinical staff for review and that they should receive a response within 2 business days.   Please advise at Mobile [phone] (mobile)

## 2023-03-05 NOTE — Telephone Encounter (Signed)
Pt called following-up on Vyepti. She is requesting a call back from nurse to discuss. Stated she will not be available to talk from 2 pm- til 3 pm.

## 2023-03-06 ENCOUNTER — Other Ambulatory Visit: Payer: Self-pay | Admitting: Internal Medicine

## 2023-03-06 ENCOUNTER — Telehealth: Payer: Self-pay | Admitting: Internal Medicine

## 2023-03-06 ENCOUNTER — Other Ambulatory Visit (HOSPITAL_COMMUNITY): Payer: Self-pay

## 2023-03-06 DIAGNOSIS — F988 Other specified behavioral and emotional disorders with onset usually occurring in childhood and adolescence: Secondary | ICD-10-CM

## 2023-03-06 MED ORDER — LISDEXAMFETAMINE DIMESYLATE 50 MG PO CHEW
1.0000 | CHEWABLE_TABLET | Freq: Every day | ORAL | 0 refills | Status: DC
  Filled 2023-03-06: qty 30, 30d supply, fill #0

## 2023-03-06 MED ORDER — LISDEXAMFETAMINE DIMESYLATE 50 MG PO CHEW
1.0000 | CHEWABLE_TABLET | Freq: Every day | ORAL | 0 refills | Status: DC
  Filled 2023-03-06 – 2023-04-03 (×2): qty 30, 30d supply, fill #0

## 2023-03-06 NOTE — Telephone Encounter (Signed)
Pt is calling and pharm does not have lisdexamfetamine (VYVANSE) 50 MG capsule  in stock however they have chewable  [place] Phone: [phone]  Fax: [phone]

## 2023-03-06 NOTE — Telephone Encounter (Signed)
Refill sent.

## 2023-03-07 ENCOUNTER — Other Ambulatory Visit (HOSPITAL_COMMUNITY): Payer: Self-pay

## 2023-03-07 NOTE — Telephone Encounter (Signed)
New PA started  Key: [id number]

## 2023-03-11 ENCOUNTER — Other Ambulatory Visit (HOSPITAL_COMMUNITY): Payer: Self-pay

## 2023-03-12 ENCOUNTER — Other Ambulatory Visit (HOSPITAL_COMMUNITY): Payer: Self-pay

## 2023-03-12 NOTE — Telephone Encounter (Signed)
PA was denied. Appeal has been started.  

## 2023-03-14 DIAGNOSIS — G43E19 Chronic migraine with aura, intractable, without status migrainosus: Secondary | ICD-10-CM | POA: Diagnosis not present

## 2023-03-18 ENCOUNTER — Other Ambulatory Visit (HOSPITAL_COMMUNITY): Payer: Self-pay

## 2023-03-18 ENCOUNTER — Telehealth: Payer: Self-pay | Admitting: Psychiatry

## 2023-03-18 ENCOUNTER — Other Ambulatory Visit: Payer: Self-pay | Admitting: Psychiatry

## 2023-03-18 DIAGNOSIS — G43909 Migraine, unspecified, not intractable, without status migrainosus: Secondary | ICD-10-CM

## 2023-03-18 MED ORDER — NURTEC 75 MG PO TBDP
1.0000 | ORAL_TABLET | Freq: Every day | ORAL | 6 refills | Status: DC | PRN
  Filled 2023-03-18 – 2023-04-09 (×2): qty 8, 30d supply, fill #0
  Filled 2023-05-21: qty 8, 30d supply, fill #1
  Filled 2023-07-16: qty 8, 30d supply, fill #2

## 2023-03-18 NOTE — Progress Notes (Unsigned)
Barbara Wood [facility] [address] [place] Phone: [phone] Subjective:   Barbara Wood, am serving as a scribe for Dr. Hulan Saas.  I'm seeing this patient by the request  of:  [name], MD  CC: Right shoulder, low back pain  [id number]  Barbara Wood is a 53 y.o. female coming in with complaint of back and neck pain. OMT on 02/06/2023. Had injection in [facility] joint and GT last visit. Patient states R shoulder started hurting 03/06/23. She also has recently fallen on that arm (dog tripped her). Here for manipulation. No other concerns.  Medications patient has been prescribed:   Taking:         Reviewed prior external information including notes and imaging from previsou exam, outside providers and external EMR if available.   As well as notes that were available from care everywhere and other healthcare systems.  Past medical history, social, surgical and family history all reviewed in electronic medical record.  No pertanent information unless stated regarding to the chief complaint.   Past Medical History:  Diagnosis Date   Allergy Age 17   Arthritis    shoulders, rt hip   Asthma Since age 85   Depression Age 89   Family history of adverse reaction to anesthesia    mother and sister   GERD (gastroesophageal reflux disease)    Hashimoto's disease    Heart murmur Mitral valve prolapse age 63   Migraine    PONV (postoperative nausea and vomiting)    Pre-diabetes    Psoriasis    Thyroid disease Since age 8    Allergies  Allergen Reactions   Amoxicillin-Pot Clavulanate Nausea And Vomiting    Augmentin causes nausea and vomiting   Sertraline Other (See Comments)    hallucinations    Sulfamethoxazole-Trimethoprim Hives   Triptans Other (See Comments)    Chest pain      Review of Systems:  No headache, visual changes, nausea, vomiting, diarrhea, constipation, dizziness, abdominal pain, skin rash,  fevers, chills, night sweats, weight loss, swollen lymph nodes, body aches, joint swelling, chest pain, shortness of breath, mood changes. POSITIVE muscle aches  Objective  Blood pressure 122/74, pulse 72, height 5\' 7"  (1.702 m), weight 199 lb (90.3 kg), last menstrual period 10/31/2019, SpO2 98 %.   General: No apparent distress alert and oriented x3 mood and affect normal, dressed appropriately.  HEENT: Pupils equal, extraocular movements intact  Respiratory: Patient's speak in full sentences and does not appear short of breath  Cardiovascular: No lower extremity edema, non tender, no erythema  More discomfort of the right shoulder noted today.  Right shoulder exam does have some discomfort noted.  Good range of motion though noted in rotator cuff does strength is good.  Osteopathic findings   T5 extended rotated and side bent right inhaled rib L1 flexed rotated and side bent right Sacrum right on right       Assessment and Plan:  Right shoulder pain Likely more of a sprain at the moment.  Will continue to monitor.  No other significant concerns at this time.  Discussed which activities to do and which ones to avoid.  Likely will continue to improve  Gluteal tendinitis of right buttock Some increase in tightness likely secondary to the fall.  Will continue to monitor.  Responded well to osteopathic manipulation.  No change in medications at the time.  Worsening pain can consider injections.  Arthritis of right acromioclavicular  joint Likely some irritation but not as much hypoechoic changes of previous exam.    Nonallopathic problems  Decision today to treat with OMT was based on Physical Exam  After verbal consent patient was treated with HVLA, ME, FPR techniques in  thoracic, lumbar, and sacral  areas  Patient tolerated the procedure well with improvement in symptoms  Patient given exercises, stretches and lifestyle modifications  See medications in patient  instructions if given  Patient will follow up in 4-8 weeks     The above documentation has been reviewed and is accurate and complete Lyndal Pulley, DO         Note: This dictation was prepared with Dragon dictation along with smaller phrase technology. Any transcriptional errors that result from this process are unintentional.

## 2023-03-18 NOTE — Telephone Encounter (Signed)
Pt states it has been about a month now that she has been trying to get her  Rimegepant Sulfate ([name]) 75 MG TBDP  , pt states she did try and get her PA done thru PCP with no success.  Pt is asking that Dr Billey Gosling  pursues PA for  Rimegepant Sulfate ([name]) 75 MG TBDP  , pt states she is down to about 6 tablets.

## 2023-03-19 ENCOUNTER — Ambulatory Visit: Payer: Commercial Managed Care - PPO | Admitting: Family Medicine

## 2023-03-19 ENCOUNTER — Other Ambulatory Visit: Payer: Self-pay

## 2023-03-19 ENCOUNTER — Encounter: Payer: Self-pay | Admitting: Family Medicine

## 2023-03-19 ENCOUNTER — Ambulatory Visit (INDEPENDENT_AMBULATORY_CARE_PROVIDER_SITE_OTHER): Payer: Commercial Managed Care - PPO | Admitting: Psychiatry

## 2023-03-19 VITALS — BP 122/74 | HR 72 | Ht 67.0 in | Wt 199.0 lb

## 2023-03-19 DIAGNOSIS — M25511 Pain in right shoulder: Secondary | ICD-10-CM

## 2023-03-19 DIAGNOSIS — M9903 Segmental and somatic dysfunction of lumbar region: Secondary | ICD-10-CM

## 2023-03-19 DIAGNOSIS — F4310 Post-traumatic stress disorder, unspecified: Secondary | ICD-10-CM

## 2023-03-19 DIAGNOSIS — M19011 Primary osteoarthritis, right shoulder: Secondary | ICD-10-CM

## 2023-03-19 DIAGNOSIS — M9904 Segmental and somatic dysfunction of sacral region: Secondary | ICD-10-CM | POA: Diagnosis not present

## 2023-03-19 DIAGNOSIS — M7601 Gluteal tendinitis, right hip: Secondary | ICD-10-CM

## 2023-03-19 DIAGNOSIS — M9902 Segmental and somatic dysfunction of thoracic region: Secondary | ICD-10-CM

## 2023-03-19 NOTE — Assessment & Plan Note (Signed)
Likely some irritation but not as much hypoechoic changes of previous exam.

## 2023-03-19 NOTE — Assessment & Plan Note (Signed)
Likely more of a sprain at the moment.  Will continue to monitor.  No other significant concerns at this time.  Discussed which activities to do and which ones to avoid.  Likely will continue to improve
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
## 2023-03-19 NOTE — Assessment & Plan Note (Signed)
Some increase in tightness likely secondary to the fall.  Will continue to monitor.  Responded well to osteopathic manipulation.  No change in medications at the time.  Worsening pain can consider injections.

## 2023-03-21 NOTE — Telephone Encounter (Signed)
On hold per Dr Jerilee Hoh

## 2023-04-02 NOTE — Telephone Encounter (Signed)
Pt stated she is still waiting on [name] to be approved. Pt is requesting a call back from nurse.

## 2023-04-03 ENCOUNTER — Ambulatory Visit (INDEPENDENT_AMBULATORY_CARE_PROVIDER_SITE_OTHER): Payer: Commercial Managed Care - PPO | Admitting: Psychiatry

## 2023-04-03 ENCOUNTER — Other Ambulatory Visit (HOSPITAL_COMMUNITY): Payer: Self-pay

## 2023-04-03 DIAGNOSIS — F4310 Post-traumatic stress disorder, unspecified: Secondary | ICD-10-CM | POA: Diagnosis not present

## 2023-04-03 MED ORDER — BACLOFEN 5 MG PO TABS
5.0000 mg | ORAL_TABLET | Freq: Three times a day (TID) | ORAL | 3 refills | Status: DC | PRN
  Filled 2023-04-03: qty 90, 30d supply, fill #0
  Filled 2023-05-07: qty 90, 30d supply, fill #1
  Filled 2023-07-01: qty 90, 30d supply, fill #2
  Filled 2023-07-30: qty 90, 30d supply, fill #3
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
## 2023-04-03 NOTE — Telephone Encounter (Signed)
Pharmacy Patient Advocate Encounter  Prior Authorization for [name] 75MG  dispersible tablets has been approved by [employer] (ins).    PA # PA Case ID: [id number] Effective dates: 04/03/2023 through 10/03/2023

## 2023-04-03 NOTE — Telephone Encounter (Signed)
Pharmacy Patient Advocate Encounter   Received notification from [place] that prior authorization for [name] 75MG  dispersible tablets is required/requested.   PA submitted on 04/03/2023 to (ins) [employer] via CoverMyMeds Key or (Medicaid) confirmation # [id number] Status is pending  Requested as an Urgent review.

## 2023-04-04 ENCOUNTER — Other Ambulatory Visit: Payer: Self-pay

## 2023-04-08 ENCOUNTER — Other Ambulatory Visit: Payer: Self-pay | Admitting: Nurse Practitioner

## 2023-04-08 DIAGNOSIS — R9389 Abnormal findings on diagnostic imaging of other specified body structures: Secondary | ICD-10-CM

## 2023-04-09 ENCOUNTER — Other Ambulatory Visit: Payer: Self-pay

## 2023-04-09 ENCOUNTER — Other Ambulatory Visit (HOSPITAL_COMMUNITY): Payer: Self-pay

## 2023-04-09 ENCOUNTER — Other Ambulatory Visit: Payer: Self-pay | Admitting: Internal Medicine

## 2023-04-09 DIAGNOSIS — G43909 Migraine, unspecified, not intractable, without status migrainosus: Secondary | ICD-10-CM

## 2023-04-09 NOTE — Telephone Encounter (Signed)
Pharmacy comment:  REQUEST FOR 90 DAYS PRESCRIPTION. DX Code Needed    

## 2023-04-10 ENCOUNTER — Other Ambulatory Visit (HOSPITAL_COMMUNITY): Payer: Self-pay

## 2023-04-11 ENCOUNTER — Other Ambulatory Visit (HOSPITAL_COMMUNITY): Payer: Self-pay

## 2023-04-24 ENCOUNTER — Ambulatory Visit (INDEPENDENT_AMBULATORY_CARE_PROVIDER_SITE_OTHER): Payer: Commercial Managed Care - PPO | Admitting: Psychiatry

## 2023-04-24 DIAGNOSIS — F4310 Post-traumatic stress disorder, unspecified: Secondary | ICD-10-CM
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
## 2023-04-30 NOTE — Progress Notes (Unsigned)
[name] [address] [place] [address] Phone: [phone] Subjective:   Barbara Wood, am serving as a scribe for Dr. Antoine Primas.  I'm seeing this patient by the request  of:  Henderson Cloud, MD  CC: Back pain and shoulder pain  BJY:[id number]  Barbara Wood is a 53 y.o. female coming in with complaint of back and neck pain. OMT on 03/19/2023. Also seen for shoulder pain. Patient states would like injection in R shoulder. [place] having some R lower leg pain. Feels that fib head is the culprit. No other concerns.          Reviewed prior external information including notes and imaging from previsou exam, outside providers and external EMR if available.   As well as notes that were available from care everywhere and other healthcare systems.  Past medical history, social, surgical and family history all reviewed in electronic medical record.  No pertanent information unless stated regarding to the chief complaint.   Past Medical History:  Diagnosis Date   Allergy Age 11   Arthritis    shoulders, rt hip   Asthma Since age 77   Depression Age 89   Family history of adverse reaction to anesthesia    mother and sister   GERD (gastroesophageal reflux disease)    Hashimoto's disease    Heart murmur Mitral valve prolapse age 19   Migraine    PONV (postoperative nausea and vomiting)    Pre-diabetes    Psoriasis    Thyroid disease Since age 18    Allergies  Allergen Reactions   Amoxicillin-Pot Clavulanate Nausea And Vomiting    Augmentin causes nausea and vomiting   Sertraline Other (See Comments)    hallucinations    Sulfamethoxazole-Trimethoprim Hives   Triptans Other (See Comments)    Chest pain      Review of Systems:  No headache, visual changes, nausea, vomiting, diarrhea, constipation, dizziness, abdominal pain, skin rash, fevers, chills, night sweats, weight loss, swollen lymph nodes, body aches, joint  swelling, chest pain, shortness of breath, mood changes. POSITIVE muscle aches  Objective  Blood pressure 116/80, pulse 83, height 5\' 7"  (1.702 m), last menstrual period 10/31/2019, SpO2 97 %.   General: No apparent distress alert and oriented x3 mood and affect normal, dressed appropriately.  HEENT: Pupils equal, extraocular movements intact  Respiratory: Patient's speak in full sentences and does not appear short of breath  Cardiovascular: No lower extremity edema, non tender, no erythema  Low back exam does have some loss lordosis noted.  Some tenderness to palpation in the paraspinal musculature right greater than left.  Tightness of the right hip flexor on the right side. Right shoulder exam does have positive crossover noted.  Osteopathic findings T3 extended rotated and side bent right inhaled rib T9 extended rotated and side bent left L2 flexed rotated and side bent right L5 flexed rotated and side bent right Sacrum right on right  Procedure: Real-time Ultrasound Guided Injection of right acromioclavicular joint Device: GE Logiq Q7 Ultrasound guided injection is preferred based studies that show increased duration, increased effect, greater accuracy, decreased procedural pain, increased response rate, and decreased cost with ultrasound guided versus blind injection.  Verbal informed consent obtained.  Time-out conducted.  Noted no overlying erythema, induration, or other signs of local infection.  Skin prepped in a sterile fashion.  Local anesthesia: Topical Ethyl chloride.  With sterile technique and under real time ultrasound guidance with a 25-gauge  half inch needle injected with 0.5 cc of 0.5% Marcaine and 0.5 cc of Kenalog 40 mg/mL:   Completed without difficulty  Pain immediately resolved suggesting accurate placement of the medication.  Advised to call if fevers/chills, erythema, induration, drainage, or persistent bleeding.  Impression: Technically successful ultrasound  guided injection.     Assessment and Plan:  Arthritis of right acromioclavicular joint Repeat injection given again today.  Patient has been doing formal physical therapy, home exercises, increase activity slowly as well.  If continuing to have pain I do feel advanced imaging would be warranted with this going on greater than 3 months and failing all other conservative therapy including injections  Lumbar radiculopathy, acute Likely no significant radicular symptoms.  At the moment.  Patient is to increase activity.  We will continue to monitor for the bursitis.  Continues to be active.  Follow-up again in 6 to 8 weeks    Nonallopathic problems  Decision today to treat with OMT was based on Physical Exam  After verbal consent patient was treated with HVLA, ME, FPR techniques in  rib, thoracic, lumbar, and sacral  areas  Patient tolerated the procedure well with improvement in symptoms  Patient given exercises, stretches and lifestyle modifications  See medications in patient instructions if given  Patient will follow up in 4-8 weeks     The above documentation has been reviewed and is accurate and complete Judi Saa, DO         Note: This dictation was prepared with Dragon dictation along with smaller phrase technology. Any transcriptional errors that result from this process are unintentional.

## 2023-05-01 ENCOUNTER — Other Ambulatory Visit: Payer: Self-pay

## 2023-05-01 ENCOUNTER — Ambulatory Visit: Payer: Commercial Managed Care - PPO | Admitting: Family Medicine

## 2023-05-01 VITALS — BP 116/80 | HR 83 | Ht 67.0 in

## 2023-05-01 DIAGNOSIS — M9908 Segmental and somatic dysfunction of rib cage: Secondary | ICD-10-CM | POA: Diagnosis not present

## 2023-05-01 DIAGNOSIS — M19011 Primary osteoarthritis, right shoulder: Secondary | ICD-10-CM | POA: Diagnosis not present

## 2023-05-01 DIAGNOSIS — M5416 Radiculopathy, lumbar region: Secondary | ICD-10-CM

## 2023-05-01 DIAGNOSIS — M9902 Segmental and somatic dysfunction of thoracic region: Secondary | ICD-10-CM

## 2023-05-01 DIAGNOSIS — M9904 Segmental and somatic dysfunction of sacral region: Secondary | ICD-10-CM | POA: Diagnosis not present

## 2023-05-01 DIAGNOSIS — M9903 Segmental and somatic dysfunction of lumbar region: Secondary | ICD-10-CM

## 2023-05-01 NOTE — Patient Instructions (Signed)
Injection in R shoulder See you again in 6-8 weeks

## 2023-05-02 ENCOUNTER — Other Ambulatory Visit (HOSPITAL_COMMUNITY): Payer: Self-pay

## 2023-05-02 ENCOUNTER — Encounter: Payer: Self-pay | Admitting: Family Medicine

## 2023-05-02 ENCOUNTER — Other Ambulatory Visit: Payer: Self-pay | Admitting: Internal Medicine

## 2023-05-02 DIAGNOSIS — G4700 Insomnia, unspecified: Secondary | ICD-10-CM

## 2023-05-02 DIAGNOSIS — F988 Other specified behavioral and emotional disorders with onset usually occurring in childhood and adolescence: Secondary | ICD-10-CM

## 2023-05-02 MED ORDER — LISDEXAMFETAMINE DIMESYLATE 50 MG PO CHEW
1.0000 | CHEWABLE_TABLET | Freq: Every day | ORAL | 0 refills | Status: DC
  Filled 2023-05-02: qty 30, 30d supply, fill #0

## 2023-05-02 MED ORDER — LISDEXAMFETAMINE DIMESYLATE 50 MG PO CHEW
1.0000 | CHEWABLE_TABLET | Freq: Every day | ORAL | 0 refills | Status: DC
Start: 2023-05-02 — End: 2023-05-29
  Filled 2023-05-02: qty 30, 30d supply, fill #0

## 2023-05-02 NOTE — Telephone Encounter (Signed)
Refills sent

## 2023-05-02 NOTE — Assessment & Plan Note (Signed)
Repeat injection given again today.  Patient has been doing formal physical therapy, home exercises, increase activity slowly as well.  If continuing to have pain I do feel advanced imaging would be warranted with this going on greater than 3 months and failing all other conservative therapy including injections

## 2023-05-02 NOTE — Assessment & Plan Note (Signed)
Likely no significant radicular symptoms.  At the moment.  Patient is to increase activity.  We will continue to monitor for the bursitis.  Continues to be active.  Follow-up again in 6 to 8 weeks

## 2023-05-02 NOTE — Telephone Encounter (Signed)
Prescription Request  05/02/2023  LOV: 02/21/2023  What is the name of the medication or equipment? lisdexamfetamine (VYVANSE) 50 MG capsule  Have you contacted your pharmacy to request a refill? No   Which pharmacy would you like this sent to?  [place] [facility] [address] [place] [address] Phone: [phone] Fax: [phone]   Patient notified that their request is being sent to the clinical staff for review and that they should receive a response within 2 business days.   Please advise at Mobile [phone] (mobile)

## 2023-05-03 ENCOUNTER — Other Ambulatory Visit (HOSPITAL_COMMUNITY): Payer: Self-pay

## 2023-05-06 ENCOUNTER — Other Ambulatory Visit (HOSPITAL_COMMUNITY): Payer: Self-pay

## 2023-05-06 MED ORDER — TRAZODONE HCL 50 MG PO TABS
50.0000 mg | ORAL_TABLET | Freq: Every evening | ORAL | 1 refills | Status: DC | PRN
Start: 2023-05-06 — End: 2023-06-20
  Filled 2023-05-06: qty 60, 30d supply, fill #0
  Filled 2023-06-18: qty 60, 30d supply, fill #1

## 2023-05-07 ENCOUNTER — Telehealth: Payer: Self-pay | Admitting: Internal Medicine

## 2023-05-07 ENCOUNTER — Other Ambulatory Visit (HOSPITAL_COMMUNITY): Payer: Self-pay

## 2023-05-07 DIAGNOSIS — G43909 Migraine, unspecified, not intractable, without status migrainosus: Secondary | ICD-10-CM

## 2023-05-07 DIAGNOSIS — M7918 Myalgia, other site: Secondary | ICD-10-CM

## 2023-05-07 NOTE — Telephone Encounter (Signed)
Prescription Request  05/07/2023  LOV: 02/21/2023  What is the name of the medication or equipment? traMADol (ULTRAM) 50 MG tablet   Have you contacted your pharmacy to request a refill? Yes   Which pharmacy would you like this sent to?   [place] [facility] [address] [place] [address] Phone: [phone] Fax: [phone]  Patient notified that their request is being sent to the clinical staff for review and that they should receive a response within 2 business days.   Please advise at Mobile [phone] (mobile)

## 2023-05-08 ENCOUNTER — Other Ambulatory Visit: Payer: Self-pay

## 2023-05-08 ENCOUNTER — Ambulatory Visit (INDEPENDENT_AMBULATORY_CARE_PROVIDER_SITE_OTHER): Payer: Commercial Managed Care - PPO | Admitting: Psychiatry

## 2023-05-08 ENCOUNTER — Other Ambulatory Visit: Payer: Self-pay | Admitting: Internal Medicine

## 2023-05-08 ENCOUNTER — Other Ambulatory Visit (HOSPITAL_COMMUNITY): Payer: Self-pay

## 2023-05-08 DIAGNOSIS — Z0000 Encounter for general adult medical examination without abnormal findings: Secondary | ICD-10-CM

## 2023-05-08 DIAGNOSIS — F4310 Post-traumatic stress disorder, unspecified: Secondary | ICD-10-CM | POA: Diagnosis not present

## 2023-05-08 MED ORDER — TRAMADOL HCL 50 MG PO TABS
100.0000 mg | ORAL_TABLET | Freq: Two times a day (BID) | ORAL | 0 refills | Status: DC
Start: 2023-05-08 — End: 2023-08-05
  Filled 2023-05-08: qty 360, 90d supply, fill #0

## 2023-05-08 NOTE — Progress Notes (Signed)
[facility] Counselor[facility]Therapist Progress Note  Patient ID: Barbara Wood, MRN: [id number],    Date: 05/08/2023  Time Spent: 45 minutes start time 1:59 PM end time 2:44 PM  Treatment Type: Individual Therapy  Reported Symptoms: triggered responses, sadness, anxiety  Mental Status Exam:  Appearance:   Well Groomed     Behavior:  Appropriate  Motor:  Normal  Speech/Language:   Normal Rate  Affect:  Appropriate  Mood:  labile  Thought process:  normal  Thought content:    WNL  Sensory/Perceptual disturbances:    WNL  Orientation:  oriented to person, place, time/date, and situation  Attention:  Good  Concentration:  Good  Memory:  WNL  Fund of knowledge:   Good  Insight:    Good  Judgment:   Good  Impulse Control:  Good   Risk Assessment: Danger to Self:  No Self-injurious Behavior: No Danger to Others: No Duty to Warn:no Physical Aggression / Violence:No  Access to Firearms a concern: No  Gang Involvement:No   Subjective: Patient was present for session. She shared that things are still very stressful with her boyfriend.  She stated that he ended up just going out of town and taking a break as he put it rather than dealing with the things that they were discussing.  Discussed how that did not follow through with the things that have been discussed in prior session which she had reviewed with him after session.  Patient did processing set on him punishing her, suds level 8, negative cognition "I am an important and too much" felt sadness and anger in her chest and eyes.  Patient was able to reduce suds level.  She was able to get things down to a 3 where they felt manageable for her and she was able to move forward.  Patient was able to recognize that he just is not able to be the person she needs him to be and that it is okay for her to move forward and do what she needs to do for herself and her daughter.  Discussed the fact that his difficulty apologizing to her daughter  for his poor behavior was a definite concern she also recognize that the fact he was blaming her for them not being together as much was also a concern.  Patient is to continue working on the things that she needs to address and take some time over the weekend to go to the beach with a friend.  Interventions: Eye Movement Desensitization and Reprocessing (EMDR) and Insight-Oriented  Diagnosis:   ICD-10-CM   1. PTSD (post-traumatic stress disorder)  F43.10       Plan:  Patient is to use coping skills to decrease triggered responses.  Patient is to take time for herself that she continues to let go of the relationship..  Patient is to work on diaphragmatic breathing safe place exercise and stretching/yoga throughout the day.  Patient is to focus on what she can control fix and change.  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  Patient is to allow processing to continue between sessions .  Patient is to continue working on limit setting with daughter and ex-husband.  Patient is to release negative emotions through exercise  Long-term goal: Develop healthy boundaries Short-term goal: Resolve the trauma from past Give her daughter more stability Trying to navigate divorce and have good boundaries Deal with childhood trauma Figure out relationship with [name] -goal met  [name],  [facility]

## 2023-05-15 ENCOUNTER — Encounter: Payer: Self-pay | Admitting: Podiatry

## 2023-05-15 ENCOUNTER — Ambulatory Visit: Payer: Commercial Managed Care - PPO | Admitting: Podiatry

## 2023-05-15 DIAGNOSIS — M7751 Other enthesopathy of right foot: Secondary | ICD-10-CM | POA: Diagnosis not present

## 2023-05-15 NOTE — Progress Notes (Signed)
Subjective:   Patient ID: Barbara Wood, female   DOB: 53 y.o.   MRN: [id number]   HPI Patient presents with inflammation of the fourth digit right foot with fluid accumulation and lesion that becomes painful   ROS      Objective:  Physical Exam  Inflammatory capsulitis fourth digit right lateral side inner phalangeal joint     Assessment:  Capsulitis of the fourth toe right foot with keratotic lesion digital deformity     Plan:  H&P he reviewed that some day surgery may be necessary but at this point sterile prep injected the inner phalangeal joint 2 mg dexamethasone 2 mg Xylocaine and debrided lesion patient to be seen back as needed

## 2023-05-21 ENCOUNTER — Other Ambulatory Visit (HOSPITAL_COMMUNITY): Payer: Self-pay

## 2023-05-21 ENCOUNTER — Other Ambulatory Visit: Payer: Self-pay | Admitting: Internal Medicine

## 2023-05-21 DIAGNOSIS — E063 Autoimmune thyroiditis: Secondary | ICD-10-CM

## 2023-05-21 MED ORDER — LEVOTHYROXINE SODIUM 100 MCG PO TABS
100.0000 ug | ORAL_TABLET | Freq: Every day | ORAL | 1 refills | Status: DC
Start: 2023-05-21 — End: 2023-11-12
  Filled 2023-05-21: qty 90, 90d supply, fill #0
  Filled 2023-08-12: qty 90, 90d supply, fill #1

## 2023-05-23 ENCOUNTER — Ambulatory Visit (INDEPENDENT_AMBULATORY_CARE_PROVIDER_SITE_OTHER): Payer: Commercial Managed Care - PPO

## 2023-05-23 ENCOUNTER — Other Ambulatory Visit: Payer: Self-pay | Admitting: Family Medicine

## 2023-05-23 ENCOUNTER — Ambulatory Visit (INDEPENDENT_AMBULATORY_CARE_PROVIDER_SITE_OTHER): Payer: Commercial Managed Care - PPO | Admitting: Psychiatry

## 2023-05-23 DIAGNOSIS — F4310 Post-traumatic stress disorder, unspecified: Secondary | ICD-10-CM | POA: Diagnosis not present

## 2023-05-23 DIAGNOSIS — M25512 Pain in left shoulder: Secondary | ICD-10-CM | POA: Diagnosis not present

## 2023-05-23 NOTE — Progress Notes (Signed)
      [facility] Counselor/Therapist Progress Note  Patient ID: Barbara Wood, MRN: [id number],    Date: 05/23/2023  Time Spent: 50 minutes start time 3:00 PM end time 3:50 PM  Treatment Type: Individual Therapy  Reported Symptoms: anxiety, triggered responses, rumination  Mental Status Exam:  Appearance:   Well Groomed     Behavior:  Appropriate  Motor:  Normal  Speech/Language:   Normal Rate  Affect:  Appropriate  Mood:  anxious  Thought process:  normal  Thought content:    WNL  Sensory/Perceptual disturbances:    WNL  Orientation:  oriented to person, place, time/date, and situation  Attention:  Good  Concentration:  Good  Memory:  WNL  Fund of knowledge:   Good  Insight:    Good  Judgment:   Good  Impulse Control:  Good   Risk Assessment: Danger to Self:  No Self-injurious Behavior: No Danger to Others: No Duty to Warn:no Physical Aggression / Violence:No  Access to Firearms a concern: No  Gang Involvement:No   Subjective: Patient was present for session. She shared that she got a message from a friend and that led to her talking to [place] again. They talked briefly and haven't had any other contact since than. She shared she is struggling because she is finding herself ruminating over the situation.  Discussed the possibility of doing some self brain spotting outside of session.  Show her different videos to look up online.  Did processing set on daughter saying she hates him, suds level 10, negative cognition "I am trapped" felt guilt in her stomach.  Patient was able to reduce his level to 3.  She was able to figure out what what she wanted to say to her daughter and how she wanted to handle the situation.  Interventions: Solution-Oriented/Positive Psychology, Eye Movement Desensitization and Reprocessing (EMDR), and Insight-Oriented  Diagnosis:   ICD-10-CM   1. PTSD (post-traumatic stress disorder)  F43.10       Plan:  Patient is to use coping skills to  decrease triggered responses.  Patient is to use self spotting as needed to decrease anxiety and rumination.  Patient is to work on diaphragmatic breathing safe place exercise and stretching/yoga throughout the day.  Patient is to focus on what she can control fix and change.  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  Patient is to allow processing to continue between sessions .  Patient is to continue working on limit setting with daughter and ex-husband.  Patient is to release negative emotions through exercise  Long-term goal: Develop healthy boundaries Short-term goal: Resolve the trauma from past Give her daughter more stability Trying to navigate divorce and have good boundaries Deal with childhood trauma Figure out relationship with [name] -progressing  Barbara Wood, [facility]

## 2023-05-29 ENCOUNTER — Other Ambulatory Visit (HOSPITAL_COMMUNITY): Payer: Self-pay

## 2023-05-29 ENCOUNTER — Other Ambulatory Visit: Payer: Self-pay | Admitting: Internal Medicine

## 2023-05-29 DIAGNOSIS — F988 Other specified behavioral and emotional disorders with onset usually occurring in childhood and adolescence: Secondary | ICD-10-CM

## 2023-05-29 DIAGNOSIS — M5412 Radiculopathy, cervical region: Secondary | ICD-10-CM | POA: Diagnosis not present

## 2023-05-29 MED ORDER — LISDEXAMFETAMINE DIMESYLATE 50 MG PO CAPS
50.0000 mg | ORAL_CAPSULE | Freq: Every day | ORAL | 0 refills | Status: DC
Start: 2023-05-29 — End: 2023-08-22
  Filled 2023-05-29 – 2023-07-30 (×2): qty 30, 30d supply, fill #0

## 2023-05-29 MED ORDER — LISDEXAMFETAMINE DIMESYLATE 50 MG PO CAPS
50.0000 mg | ORAL_CAPSULE | Freq: Every day | ORAL | 0 refills | Status: DC
  Filled 2023-05-29 – 2023-06-26 (×2): qty 30, 30d supply, fill #0

## 2023-05-29 MED ORDER — LISDEXAMFETAMINE DIMESYLATE 50 MG PO CAPS
50.0000 mg | ORAL_CAPSULE | Freq: Every day | ORAL | 0 refills | Status: DC
  Filled 2023-05-29: qty 30, 30d supply, fill #0

## 2023-05-29 NOTE — Addendum Note (Signed)
Addended by: Kern Reap B on: 05/29/2023 09:34 AM   Modules accepted: Orders

## 2023-05-29 NOTE — Telephone Encounter (Signed)
Prescription Request  05/29/2023  LOV: 02/21/2023  What is the name of the medication or equipment? Lisdexamfetamine Dimesylate (VYVANSE) 50 MG CHEW  requesting regular tablets, not chewable form  Have you contacted your pharmacy to request a refill? No   Which pharmacy would you like this sent to?  [place] [facility] [address] [place] [address] Phone: [phone] Fax: [phone]     Patient notified that their request is being sent to the clinical staff for review and that they should receive a response within 2 business days.   Please advise at Mobile [phone] (mobile)

## 2023-05-29 NOTE — Telephone Encounter (Signed)
Refills sent

## 2023-06-05 ENCOUNTER — Ambulatory Visit (INDEPENDENT_AMBULATORY_CARE_PROVIDER_SITE_OTHER): Payer: Commercial Managed Care - PPO | Admitting: Psychiatry

## 2023-06-05 DIAGNOSIS — F4310 Post-traumatic stress disorder, unspecified: Secondary | ICD-10-CM | POA: Diagnosis not present

## 2023-06-05 NOTE — Progress Notes (Signed)
      [facility] Counselor/Therap[facility] Progress Note  Patient ID: Barbara Wood, MRN: [id number],    Date: 06/05/2023  Time Spent: 50 minutes start time 2:04 PM end time 2:54 PM  Treatment Type: Individual Therapy  Reported Symptoms: anxiety, triggered responses, sadness  Mental Status Exam:  Appearance:   Well Groomed     Behavior:  Appropriate  Motor:  Normal  Speech/Language:   Normal Rate  Affect:  Appropriate  Mood:  anxious  Thought process:  normal  Thought content:    WNL  Sensory/Perceptual disturbances:    WNL  Orientation:  oriented to person, place, time/date, and situation  Attention:  Good  Concentration:  Good  Memory:  WNL  Fund of knowledge:   Good  Insight:    Good  Judgment:   Good  Impulse Control:  Good   Risk Assessment: Danger to Self:  No Self-injurious Behavior: No Danger to Others: No Duty to Warn:no Physical Aggression / Violence:No  Access to Firearms a concern: No  Gang Involvement:No   Subjective: Patient was present for session.  She found out that Barbara Wood was diagnosed with having seizures which explained the issues and with medication he seems calmer.  Patient went on to explain how that has helped things to move in a different direction and it made her feel better about her decision about not completely ending the relationship.  She shared that it has also helped things with her daughter and him so that has helped things within her home.  She continues to work on setting limits with her daughter and try and remind herself that she is doing the right things for her.  Patient shares she is getting her CEUs completed even though she has other things that are taking up her time that she is not getting get credit for.  Patient was encouraged to be doing what is working and to make sure she takes time to release negative emotions appropriately.  Interventions: Solution-Oriented/Positive Psychology  Diagnosis:   ICD-10-CM   1. PTSD (post-traumatic  stress disorder)  F43.10       Plan:  Patient is to use coping skills to decrease triggered responses.  Patient is to use self spotting as needed to decrease anxiety and rumination.  Patient is to work on diaphragmatic breathing safe place exercise and stretching/yoga throughout the day.  Patient is to focus on what she can control fix and change.  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  Patient is to allow processing to continue between sessions .  Patient is to continue working on limit setting with daughter and ex-husband.  Patient is to release negative emotions through exercise  Long-term goal: Develop healthy boundaries Short-term goal: Resolve the trauma from past Give her daughter more stability Trying to navigate divorce and have good boundaries Deal with childhood trauma Figure out relationship with Barbara Wood -progressing  Barbara Wood, [facility]

## 2023-06-12 ENCOUNTER — Ambulatory Visit: Payer: Commercial Managed Care - PPO

## 2023-06-17 NOTE — Progress Notes (Unsigned)
[facility] [address] [place] [address] Phone: [phone] Subjective:   Barbara Wood, am serving as a scribe for Dr. Antoine Primas.  I'm seeing this patient by the request  of:  Barbara Wood, Barbara Patricia, MD  CC: Back and neck pain as well as shoulder pain  ONG:[id number]  Barbara Wood is a 53 y.o. female coming in with complaint of back and neck pain. OMT on 05/01/2023. Also seen for shoulder pain. Patient states that she is having tightness in scales and levators. Also feels like rib is out on R side. Migraine today with infusion for migraine scehdueld for tomorrow.        Patient did have x-rays of the left shoulder done on May 23 that showed no significant bony arthropathy  Reviewed prior external information including notes and imaging from previsou exam, outside providers and external EMR if available.   As well as notes that were available from care everywhere and other healthcare systems.  Past medical history, social, surgical and family history all reviewed in electronic medical record.  No pertanent information unless stated regarding to the chief complaint.   Past Medical History:  Diagnosis Date   Allergy Age 54   Arthritis    shoulders, rt hip   Asthma Since age 47   Depression Age 72   Family history of adverse reaction to anesthesia    mother and sister   GERD (gastroesophageal reflux disease)    Hashimoto's disease    Heart murmur Mitral valve prolapse age 32   Migraine    PONV (postoperative nausea and vomiting)    Pre-diabetes    Psoriasis    Thyroid disease Since age 68    Allergies  Allergen Reactions   Amoxicillin-Pot Clavulanate Nausea And Vomiting    Augmentin causes nausea and vomiting   Sertraline Other (See Comments)    hallucinations    Sulfamethoxazole-Trimethoprim Hives   Triptans Other (See Comments)    Chest pain      Review of Systems:  No headache, visual changes, nausea, vomiting,  diarrhea, constipation, dizziness, abdominal pain, skin rash, fevers, chills, night sweats, weight loss, swollen lymph nodes, body aches, joint swelling, chest pain, shortness of breath, mood changes. POSITIVE muscle aches  Objective  Blood pressure 118/78, pulse 86, height 5\' 7"  (1.702 m), weight 198 lb (89.8 kg), last menstrual period 10/31/2019, SpO2 97 %.   General: No apparent distress alert and oriented x3 mood and affect normal, dressed appropriately.  HEENT: Pupils equal, extraocular movements intact  Respiratory: Patient's speak in full sentences and does not appear short of breath  Cardiovascular: No lower extremity edema, non tender, no erythema  Right shoulder blade and does have some tightness noted.  In the paraspinal musculature.  Patient does have tightness that seems to be more muscular in nature.  Slipped rib is noted as well in the parascapular area.  Osteopathic findings  T3 extended rotated and side bent right inhaled rib T8 extended rotated and side bent left L2 flexed rotated and side bent right Sacrum right on right       Assessment and Plan:  Right shoulder pain Continues to have some shoulder pain.  Last injection seem to be in January.  Discussed with patient about which activities to do and which ones to avoid.  Will continue to monitor.  Follow-up again in 6 to 8 weeks.  After manipulation therapy patient unfortunately did have muscle spasm.  Trigger point injections given  into the right shoulder blade.  Hopefully patient does respond well to this.  Patient does have baclofen and will take that nightly for breakthrough and will call us if any worsening pain.    Nonallopathic problems  Decision today to treat with OMT was based on Physical Exam  After verbal consent patient was treated with HVLA, ME, FPR techniques in  rib, thoracic, lumbar, and sacral  areas  Patient tolerated the procedure well with improvement in symptoms  Patient given exercises,  stretches and lifestyle modifications  See medications in patient instructions if given  Patient will follow up in 4-8 weeks     The above documentation has been reviewed and is accurate and complete Judi Saa, DO         Note: This dictation was prepared with Dragon dictation along with smaller phrase technology. Any transcriptional errors that result from this process are unintentional.

## 2023-06-18 ENCOUNTER — Encounter: Payer: Self-pay | Admitting: Family Medicine

## 2023-06-18 ENCOUNTER — Other Ambulatory Visit (HOSPITAL_COMMUNITY): Payer: Self-pay

## 2023-06-18 ENCOUNTER — Ambulatory Visit: Payer: Commercial Managed Care - PPO | Admitting: Family Medicine

## 2023-06-18 VITALS — BP 118/78 | HR 86 | Ht 67.0 in | Wt 198.0 lb

## 2023-06-18 DIAGNOSIS — M25511 Pain in right shoulder: Secondary | ICD-10-CM

## 2023-06-18 DIAGNOSIS — M9904 Segmental and somatic dysfunction of sacral region: Secondary | ICD-10-CM | POA: Diagnosis not present

## 2023-06-18 DIAGNOSIS — M9908 Segmental and somatic dysfunction of rib cage: Secondary | ICD-10-CM

## 2023-06-18 DIAGNOSIS — M9903 Segmental and somatic dysfunction of lumbar region: Secondary | ICD-10-CM | POA: Diagnosis not present

## 2023-06-18 DIAGNOSIS — M9902 Segmental and somatic dysfunction of thoracic region: Secondary | ICD-10-CM

## 2023-06-18 NOTE — Assessment & Plan Note (Signed)
Continues to have some shoulder pain.  Last injection seem to be in January.  Discussed with patient about which activities to do and which ones to avoid.  Will continue to monitor.  Follow-up again in 6 to 8 weeks.  After manipulation therapy patient unfortunately did have muscle spasm.  Trigger point injections given into the right shoulder blade.  Hopefully patient does respond well to this.  Patient does have baclofen and will take that nightly for breakthrough and will call us if any worsening pain.

## 2023-06-18 NOTE — Patient Instructions (Signed)
Good to see you Keep working on posture Will monitor shoulder See me in 6-8 weeks

## 2023-06-19 DIAGNOSIS — G43E19 Chronic migraine with aura, intractable, without status migrainosus: Secondary | ICD-10-CM | POA: Diagnosis not present

## 2023-06-20 ENCOUNTER — Ambulatory Visit: Payer: Commercial Managed Care - PPO | Admitting: Internal Medicine

## 2023-06-20 ENCOUNTER — Encounter: Payer: Self-pay | Admitting: Internal Medicine

## 2023-06-20 ENCOUNTER — Other Ambulatory Visit (HOSPITAL_COMMUNITY): Payer: Self-pay

## 2023-06-20 VITALS — BP 124/80 | HR 75 | Temp 98.4°F | Wt 198.7 lb

## 2023-06-20 DIAGNOSIS — Z23 Encounter for immunization: Secondary | ICD-10-CM

## 2023-06-20 DIAGNOSIS — R7302 Impaired glucose tolerance (oral): Secondary | ICD-10-CM | POA: Diagnosis not present

## 2023-06-20 DIAGNOSIS — J4540 Moderate persistent asthma, uncomplicated: Secondary | ICD-10-CM | POA: Diagnosis not present

## 2023-06-20 DIAGNOSIS — G5761 Lesion of plantar nerve, right lower limb: Secondary | ICD-10-CM | POA: Diagnosis not present

## 2023-06-20 DIAGNOSIS — G4700 Insomnia, unspecified: Secondary | ICD-10-CM | POA: Diagnosis not present

## 2023-06-20 LAB — POCT GLYCOSYLATED HEMOGLOBIN (HGB A1C): Hemoglobin A1C: 5.6 % (ref 4.0–5.6)

## 2023-06-20 MED ORDER — TRAZODONE HCL 50 MG PO TABS
50.0000 mg | ORAL_TABLET | Freq: Every evening | ORAL | 1 refills | Status: DC | PRN
Start: 2023-06-20 — End: 2023-08-12
  Filled 2023-06-20: qty 60, 30d supply, fill #0
  Filled 2023-07-16: qty 60, 30d supply, fill #1

## 2023-06-20 MED ORDER — BUDESONIDE-FORMOTEROL FUMARATE 80-4.5 MCG/ACT IN AERO
2.0000 | INHALATION_SPRAY | Freq: Two times a day (BID) | RESPIRATORY_TRACT | 3 refills | Status: DC
Start: 2023-06-20 — End: 2023-11-20
  Filled 2023-06-20: qty 10.2, 30d supply, fill #0
  Filled 2023-08-15: qty 10.2, 30d supply, fill #1
  Filled 2023-10-11: qty 10.2, 30d supply, fill #2

## 2023-06-20 NOTE — Progress Notes (Signed)
Established Patient Office Visit     CC/Reason for Visit: Follow up chronic conditions, medication refills  HPI: Barbara Wood is a 53 y.o. female who is coming in today for the above mentioned reasons. Past Medical History is significant for: ADHD on stable dose of Vyvanse for approximately 9 years, she has a very significant history of migraines.  She has myofascial pain and cervical disc disease gets OMT by Dr. Ayesha Mohair she takes tramadol daily and has a very sporadic prescription for Norco for migraine relief when the [name] and Emgality do not take care of it.  She also has a history of impaired glucose tolerance.  She also has hypothyroidism.   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 Se also has asthma. Is requesting refills of symbicort and trazodone today. Due for A1c check and second shingles vaccine.   Past Medical/Surgical History: Past Medical History:  Diagnosis Date   Allergy Age 68   Arthritis    shoulders, rt hip   Asthma Since age 65   Depression Age 28   Family history of adverse reaction to anesthesia    mother and sister   GERD (gastroesophageal reflux disease)    Hashimoto's disease    Heart murmur Mitral valve prolapse age 25   Migraine    PONV (postoperative nausea and vomiting)    Pre-diabetes    Psoriasis    Thyroid disease Since age 56    Past Surgical History:  Procedure Laterality Date   BREAST SURGERY  Reduction age 67   CERVICAL DISC ARTHROPLASTY N/A 11/27/2022   Procedure: ARTIFICIAL DISC REPLACEMENT Cervical five-six;  Surgeon: Bethann Goo, DO;  Location: [facility] OR;  Service: Neurosurgery;  Laterality: N/A;   CHOLECYSTECTOMY  Age 65   KNEE ARTHROSCOPY W/ LATERAL RELEASE Right    REDUCTION MAMMAPLASTY     WISDOM TOOTH EXTRACTION      Social History:  reports that she has never smoked. She has never used smokeless tobacco. She reports current alcohol use. She reports that she does not use drugs.  Allergies: Allergies  Allergen Reactions    Amoxicillin-Pot Clavulanate Nausea And Vomiting    Augmentin causes nausea and vomiting   Sertraline Other (See Comments)    hallucinations    Sulfamethoxazole-Trimethoprim Hives   Triptans Other (See Comments)    Chest pain     Family History:  Family History  Problem Relation Age of Onset   Asthma Sister    Miscarriages / [place] Sister    Depression Sister    Psoriasis Sister    Cancer Mother    Depression Mother    Hashimoto's thyroiditis Mother    Diabetes Mother    Bipolar disorder Mother    Hypertension Father    Ankylosing spondylitis Father    Psoriasis Father    Psoriasis Brother    Migraines Brother    ADD / ADHD Brother    Eczema Brother    Allergies Brother    Psoriasis Maternal Aunt    Psoriasis Sister    Arthritis Sister        psoriatic arthritis    Psoriasis Sister    Rheum arthritis Sister    Depression Sister    Anxiety disorder Sister    Eczema Sister    Psoriasis Brother    ADD / ADHD Daughter    Eczema Daughter    Asthma Daughter      Current Outpatient Medications:    albuterol (PROAIR HFA) 108 (90 Base) MCG/ACT inhaler,  Inhale 2 puffs into the lungs every 6 (six) hours as needed for wheezing or shortness of breath., Disp: 18 g, Rfl: 2   albuterol (VENTOLIN HFA) 108 (90 Base) MCG/ACT inhaler, Inhale 2 puffs into the lungs every 6 (six) hours as needed for wheezing or shortness of breath, Disp: 6.7 g, Rfl: 1   ARIPiprazole (ABILIFY) 10 MG tablet, Take 1 tablet (10 mg total) by mouth daily., Disp: 90 tablet, Rfl: 3   atenolol (TENORMIN) 25 MG tablet, Take 50 mg by mouth daily., Disp: , Rfl:    Baclofen 5 MG TABS, Take 1 tablet (5 mg total) by mouth 2 (two) times daily as needed for spasms, Disp: 60 tablet, Rfl: 3   Baclofen 5 MG TABS, Take 1 tablet (5 mg total) by mouth 3 (three) times daily as needed for spasms. (Patient taking differently: Take 5 mg by mouth in the morning, at noon, and at bedtime.), Disp: 90 tablet, Rfl: 3    betamethasone dipropionate 0.05 % cream, Apply topically 2 (two) times daily., Disp: 45 g, Rfl: 5   betamethasone dipropionate 0.05 % lotion, Apply 1 application topically to the scalp daily. Taper use as able., Disp: 120 mL, Rfl: 3   Budesonide (RHINOCORT ALLERGY NA), Place 1 spray into both nostrils 2 (two) times daily., Disp: , Rfl:    cetirizine (ZYRTEC) 10 MG tablet, Take 1 tablet by mouth daily., Disp: , Rfl:    Cholecalciferol (VITAMIN D3) 250 MCG (10000 UT) capsule, Take 10,000 Units by mouth daily., Disp: , Rfl:    conjugated estrogens (PREMARIN) vaginal cream, Place 0.5 g vaginally 3 (three) times a week., Disp: 30 g, Rfl: 12   COVID-19 At Home Antigen Test (CARESTART COVID-19 HOME TEST) KIT, use as directed, Disp: 8 each, Rfl: 0   diclofenac (VOLTAREN) 25 MG EC tablet, Take 75 mg by mouth 2 (two) times daily., Disp: , Rfl:    HYDROcodone-acetaminophen (NORCO/VICODIN) 5-325 MG tablet, Take 1 tablet by mouth every 6 (six) hours as needed for pain., Disp: 30 tablet, Rfl: 0   levothyroxine (SYNTHROID) 100 MCG tablet, Take 1 tablet (100 mcg total) by mouth daily., Disp: 90 tablet, Rfl: 1   lidocaine (LIDODERM) 5 %, Place 1 patch onto the skin daily. Remove & Discard patch within 12 hours or as directed by MD, Disp: 30 patch, Rfl: 2   liothyronine (CYTOMEL) 5 MCG tablet, Take 1 tablet (5 mcg total) by mouth in the morning, at noon, and at bedtime., Disp: 270 tablet, Rfl: 3   lisdexamfetamine (VYVANSE) 50 MG capsule, Take 1 capsule (50 mg total) by mouth daily., Disp: 30 capsule, Rfl: 0   lisdexamfetamine (VYVANSE) 50 MG capsule, Take 1 capsule (50 mg total) by mouth daily., Disp: 30 capsule, Rfl: 0   lisdexamfetamine (VYVANSE) 50 MG capsule, Take 1 capsule (50 mg total) by mouth daily., Disp: 30 capsule, Rfl: 0   metaxalone (SKELAXIN) 800 MG tablet, Take 1 tablet (800 mg total) by mouth 3 (three) times daily., Disp: 90 tablet, Rfl: 1   montelukast (SINGULAIR) 10 MG tablet, Take 1 tablet (10 mg  total) by mouth at bedtime., Disp: 90 tablet, Rfl: 3   omeprazole (PRILOSEC) 20 MG capsule, Take 1 capsule (20 mg total) by mouth daily., Disp: 30 capsule, Rfl: 3   predniSONE (STERAPRED UNI-PAK 21 TAB) 10 MG (21) TBPK tablet, Take as directed, Disp: 21 tablet, Rfl: 0   progesterone (PROMETRIUM) 100 MG capsule, TAKE 1 CAPSULE BY MOUTH EVERY DAY, Disp: 90 capsule, Rfl: 0  promethazine (PHENERGAN) 25 MG tablet, Take 1 tablet (25 mg total) by mouth every 6 (six) hours as needed for nausea or vomiting., Disp: 30 tablet, Rfl: 3   Rimegepant Sulfate ([name]) 75 MG TBDP, Take 1 tablet (75 mg total) by mouth daily as needed (migraine). Max dose 1 pill in 24 hours, Disp: 8 tablet, Rfl: 6   traMADol (ULTRAM) 50 MG tablet, Take 2 tablets (100 mg total) by mouth 2 (two) times daily., Disp: 360 tablet, Rfl: 0   budesonide-formoterol (SYMBICORT) 80-4.5 MCG/ACT inhaler, Inhale 2 puffs into the lungs 2 (two) times daily., Disp: 10.2 g, Rfl: 3   traZODone (DESYREL) 50 MG tablet, Take 1-2 tablets (50-100 mg total) by mouth at bedtime as needed for sleep., Disp: 60 tablet, Rfl: 1  Review of Systems:  Negative unless indicated in HPI.   Physical Exam: Vitals:   06/20/23 1501  BP: 124/80  Pulse: 75  Temp: 98.4 F (36.9 C)  TempSrc: Oral  SpO2: 98%  Weight: 198 lb 11.2 oz (90.1 kg)    Body mass index is 31.12 kg/m.   Physical Exam Vitals reviewed.  Constitutional:      Appearance: Normal appearance.  HENT:     Head: Normocephalic and atraumatic.  Eyes:     Conjunctiva/sclera: Conjunctivae normal.     Pupils: Pupils are equal, round, and reactive to light.  Skin:    General: Skin is warm and dry.  Neurological:     General: No focal deficit present.     Mental Status: She is alert and oriented to person, place, and time.  Psychiatric:        Mood and Affect: Mood normal.        Behavior: Behavior normal.        Thought Content: Thought content normal.        Judgment: Judgment normal.       Impression and Plan:  IGT (impaired glucose tolerance) -     POCT glycosylated hemoglobin (Hb A1C)  Insomnia, unspecified type -     traZODone HCl; Take 1-2 tablets (50-100 mg total) by mouth at bedtime as needed for sleep.  Dispense: 60 tablet; Refill: 1  Immunization due  Moderate persistent asthma without complication -     Budesonide-Formoterol Fumarate; Inhale 2 puffs into the lungs 2 (two) times daily.  Dispense: 10.2 g; Refill: 3  -A1c is 5.6. Continue to work on lifestyle changes. -Refill trazodone and symbicort. -Second shingles vaccine given today.   Time spent:30 minutes reviewing chart, interviewing and examining patient and formulating plan of care.     Chaya Jan, MD [facility]

## 2023-06-20 NOTE — Addendum Note (Signed)
Addended by: Kern Reap B on: 06/20/2023 03:51 PM   Modules accepted: Orders

## 2023-06-26 ENCOUNTER — Other Ambulatory Visit (HOSPITAL_COMMUNITY): Payer: Self-pay

## 2023-07-01 ENCOUNTER — Other Ambulatory Visit: Payer: Self-pay | Admitting: Radiology

## 2023-07-01 ENCOUNTER — Other Ambulatory Visit: Payer: Self-pay

## 2023-07-01 DIAGNOSIS — R9389 Abnormal findings on diagnostic imaging of other specified body structures: Secondary | ICD-10-CM

## 2023-07-01 MED ORDER — PROGESTERONE MICRONIZED 100 MG PO CAPS: 100.0000 mg | ORAL_CAPSULE | Freq: Every day | ORAL | 0 refills | Status: DC

## 2023-07-01 NOTE — Telephone Encounter (Signed)
Pt LVM in triage line stating she needs just one more month refill on her progesterone until she can be seen on 07/24/2023 for her AEX.  Last AEX 06/21/2022--scheduled for 07/24/2023. Last mammo 04/11/2022-neg birads 1, scheduled for 07/10/2023.  Rx pend.

## 2023-07-01 NOTE — Telephone Encounter (Signed)
Pt.notified

## 2023-07-03 ENCOUNTER — Other Ambulatory Visit (HOSPITAL_COMMUNITY): Payer: Self-pay

## 2023-07-10 ENCOUNTER — Ambulatory Visit
Admission: RE | Admit: 2023-07-10 | Discharge: 2023-07-10 | Disposition: A | Discharge status: Home / Self Care | Payer: Commercial Managed Care - PPO | Source: Ambulatory Visit | Attending: Internal Medicine | Admitting: Internal Medicine

## 2023-07-10 DIAGNOSIS — Z0000 Encounter for general adult medical examination without abnormal findings: Secondary | ICD-10-CM

## 2023-07-10 DIAGNOSIS — Z1231 Encounter for screening mammogram for malignant neoplasm of breast: Secondary | ICD-10-CM | POA: Diagnosis not present

## 2023-07-16 ENCOUNTER — Other Ambulatory Visit (HOSPITAL_COMMUNITY): Payer: Self-pay

## 2023-07-19 ENCOUNTER — Other Ambulatory Visit (HOSPITAL_COMMUNITY): Payer: Self-pay

## 2023-07-24 ENCOUNTER — Encounter: Payer: Self-pay | Admitting: Radiology

## 2023-07-24 ENCOUNTER — Ambulatory Visit (INDEPENDENT_AMBULATORY_CARE_PROVIDER_SITE_OTHER): Payer: Commercial Managed Care - PPO | Admitting: Radiology

## 2023-07-24 VITALS — BP 114/72 | Ht 66.75 in | Wt 201.0 lb

## 2023-07-24 DIAGNOSIS — Z01419 Encounter for gynecological examination (general) (routine) without abnormal findings: Secondary | ICD-10-CM | POA: Diagnosis not present

## 2023-07-24 DIAGNOSIS — R9389 Abnormal findings on diagnostic imaging of other specified body structures: Secondary | ICD-10-CM | POA: Diagnosis not present

## 2023-07-24 MED ORDER — PROGESTERONE MICRONIZED 100 MG PO CAPS
100.0000 mg | ORAL_CAPSULE | Freq: Every day | ORAL | 4 refills | Status: DC
Start: 2023-07-24 — End: 2024-09-29
  Filled 2024-01-07: qty 90, 90d supply, fill #0
  Filled 2024-04-04: qty 90, 90d supply, fill #1
  Filled 2024-06-30: qty 90, 90d supply, fill #2

## 2023-07-24 NOTE — Progress Notes (Signed)
   Barbara Wood Apr 24, 1970 [id number]   History: Postmenopausal 53 y.o. presents for annual exam. No gyn concerns.   Gynecologic History Postmenopausal Last Pap: 2022. Results were: normal Last mammogram: 07/10/23. Results were: normal Last colonoscopy: 2016   Obstetric History OB History  Gravida Para Term Preterm AB Living  1 1       1   SAB IAB Ectopic Multiple Live Births          1    # Outcome Date GA Lbr Len/2nd Weight Sex Type Anes PTL Lv  1 Para              The following portions of the patient's history were reviewed and updated as appropriate: allergies, current medications, past family history, past medical history, past social history, past surgical history, and problem list.  Review of Systems Pertinent items noted in HPI and remainder of comprehensive ROS otherwise negative.  Past medical history, past surgical history, family history and social history were all reviewed and documented in the EPIC chart.  Exam:  Vitals:   07/24/23 1330  BP: 114/72  Weight: 201 lb (91.2 kg)  Height: 5' 6.75" (1.695 m)   Body mass index is 31.72 kg/m.  General appearance:  Normal Thyroid:  Symmetrical, normal in size, without palpable masses or nodularity. Respiratory  Auscultation:  Clear without wheezing or rhonchi Cardiovascular  Auscultation:  Regular rate, without rubs, murmurs or gallops  Edema/varicosities:  Not grossly evident Abdominal  Soft,nontender, without masses, guarding or rebound.  Liver/spleen:  No organomegaly noted  Hernia:  None appreciated  Skin  Inspection:  Grossly normal Breasts: Examined lying and sitting. Reduction scars present bilaterally.  Right: Without masses, retractions, nipple discharge or axillary adenopathy.   Left: Without masses, retractions, nipple discharge or axillary adenopathy. Genitourinary   Inguinal/mons:  Normal without inguinal adenopathy  External genitalia:  Normal appearing vulva with no masses, tenderness, or  lesions  BUS/Urethra/Skene's glands:  Normal  Vagina:  Normal appearing with normal color and discharge, no lesions.   Cervix:  Normal appearing without discharge or lesions  Uterus:  Normal in size, shape and contour.  Midline and mobile, nontender  Adnexa/parametria:     Rt: Normal in size, without masses or tenderness.   Lt: Normal in size, without masses or tenderness.  Anus and perineum: Normal    Raynelle Fanning, CMA present for exam  Assessment/Plan:   1. Well woman exam with routine gynecological exam Pap 2025  2. Thickened endometrium Doing well on progesterone, will continue. Ran out x 5 days and had depressive symptoms. - progesterone (PROMETRIUM) 100 MG capsule; Take 1 capsule (100 mg total) by mouth daily.  Dispense: 90 capsule; Refill: 4    Discussed SBE, pap screening as directed. Recommend of exercise weekly, including weight bearing exercise. Encouraged the use of seatbelts and sunscreen.  Return in 1 year for annual or sooner prn.  Arlie Solomons B WHNP-BC, 1:53 PM 07/24/2023

## 2023-07-25 NOTE — Progress Notes (Signed)
[facility] [address] [place] [address] Phone: [phone] Subjective:   INadine Counts, am serving as a scribe for Dr. Antoine Primas.  I'm seeing this patient by the request  of:  Philip Aspen, Limmie Patricia, MD  CC: Back and neck pain follow-up  [id number]  Barbara Wood is a 53 y.o. female coming in with complaint of back and neck pain. OMT on 06/18/2023. Patient states sitting hurts constantly. Laying down in bed is okay, but not on couch.  Having more pain mostly on the right side of the hip.  Seems to be more of the greater trochanteric area but also pointing towards more of the gluteal tendon.  Medications patient has been prescribed:   Taking:         Reviewed prior external information including notes and imaging from previsou exam, outside providers and external EMR if available.   As well as notes that were available from care everywhere and other healthcare systems.  Past medical history, social, surgical and family history all reviewed in electronic medical record.  No pertanent information unless stated regarding to the chief complaint.   Past Medical History:  Diagnosis Date   Allergy Age 5   Arthritis    shoulders, rt hip   Asthma Since age 44   Depression Age 44   Family history of adverse reaction to anesthesia    mother and sister   GERD (gastroesophageal reflux disease)    Hashimoto's disease    Heart murmur Mitral valve prolapse age 89   Migraine    PONV (postoperative nausea and vomiting)    Pre-diabetes    Psoriasis    Thyroid disease Since age 84    Allergies  Allergen Reactions   Amoxicillin-Pot Clavulanate Nausea And Vomiting    Augmentin causes nausea and vomiting   Sertraline Other (See Comments)    hallucinations    Sulfamethoxazole-Trimethoprim Hives   Triptans Other (See Comments)    Chest pain      Review of Systems:  No  visual changes, nausea, vomiting, diarrhea, constipation,  dizziness, abdominal pain, skin rash, fevers, chills, night sweats, weight loss, swollen lymph nodes, body aches, joint swelling, chest pain, shortness of breath, mood changes. POSITIVE muscle aches, headache  Objective  Blood pressure 122/76, pulse 93, height 5\' 6"  (1.676 m), last menstrual period 10/31/2019, SpO2 95%.   General: No apparent distress alert and oriented x3 mood and affect normal, dressed appropriately.  HEENT: Pupils equal, extraocular movements intact  Respiratory: Patient's speak in full sentences and does not appear short of breath  Cardiovascular: No lower extremity edema, non tender, no erythema  \Antalgic gait noted.  Worsening pain over the greater trochanteric area.  Patient also has some tenderness over the gluteal area. Back tender to palpation over the gluteal tendon as well as over the piriformis and gluteal area  Osteopathic findings  T3 extended rotated and side bent right inhaled rib T9 extended rotated and side bent left L2 flexed rotated and side bent right Sacrum right on right   Procedure: Real-time Ultrasound Guided Injection of right greater trochanteric bursitis secondary to patient's body habitus Device: GE Logiq Q7 Ultrasound guided injection is preferred based studies that show increased duration, increased effect, greater accuracy, decreased procedural pain, increased response rate, and decreased cost with ultrasound guided versus blind injection.  Verbal informed consent obtained.  Time-out conducted.  Noted no overlying erythema, induration, or other signs of local infection.  Skin prepped in  a sterile fashion.  Local anesthesia: Topical Ethyl chloride.  With sterile technique and under real time ultrasound guidance:  Greater trochanteric area was visualized and patient's bursa was noted. A 22-gauge 3 inch needle was inserted and 4 cc of 0.5% Marcaine and 1 cc of Kenalog 40 mg/dL was injected. Pictures taken Completed without difficulty  Pain  immediately resolved suggesting accurate placement of the medication.  Advised to call if fevers/chills, erythema, induration, drainage, or persistent bleeding.  Impression: Technically successful ultrasound guided injection.  Procedure: Real-time Ultrasound Guided Injection of right gluteal tendon sheath Device: GE Logiq Q7 Ultrasound guided injection is preferred based studies that show increased duration, increased effect, greater accuracy, decreased procedural pain, increased response rate, and decreased cost with ultrasound guided versus blind injection.  Verbal informed consent obtained.  Time-out conducted.  Noted no overlying erythema, induration, or other signs of local infection.  Skin prepped in a sterile fashion.  Local anesthesia: Topical Ethyl chloride.  With sterile technique and under real time ultrasound guidance: With a 21-gauge 2 inch needle injected with 0.5 cc of 0.5% Marcaine and 1 cc of Kenalog 40 mg/mL Completed without difficulty  Pain immediately resolved suggesting accurate placement of the medication.  Advised to call if fevers/chills, erythema, induration, drainage, or persistent bleeding.  Impression: Technically successful ultrasound guided injection.   Assessment and Plan:  Gluteal tendinitis of right buttock Chronic problem.  Exacerbation seems to be more of the piriformis on exam but with ultrasound there was hypoechoic changes in the gluteal tendon sheath.  Given injection in this area.  Patient was able to get up without any significant difficulty with no weakness of the lower extremity.  Discussed with patient about icing regimen and home exercises, discussed avoiding certain activities.  Will follow-up again in 6 to 8 weeks.  Greater trochanteric bursitis of right hip Exam showed irritation again.  Discussed with patient about icing regimen.  Discussed with patient about hip abductor strengthening but patient has not had a lot of improvement secondary to  having more anxiety and life at the moment.  Follow-up with me again 6 to 8 weeks    Nonallopathic problems  Decision today to treat with OMT was based on Physical Exam  After verbal consent patient was treated with HVLA, ME, FPR techniques in thoracic, lumbar, and sacral  areas  Patient tolerated the procedure well with improvement in symptoms  Patient given exercises, stretches and lifestyle modifications  See medications in patient instructions if given  Patient will follow up in 4-8 weeks     The above documentation has been reviewed and is accurate and complete Judi Saa, DO         Note: This dictation was prepared with Dragon dictation along with smaller phrase technology. Any transcriptional errors that result from this process are unintentional.

## 2023-07-30 ENCOUNTER — Other Ambulatory Visit (HOSPITAL_COMMUNITY): Payer: Self-pay

## 2023-07-31 ENCOUNTER — Ambulatory Visit: Payer: Commercial Managed Care - PPO | Admitting: Family Medicine

## 2023-07-31 ENCOUNTER — Encounter: Payer: Self-pay | Admitting: Family Medicine

## 2023-07-31 ENCOUNTER — Ambulatory Visit (INDEPENDENT_AMBULATORY_CARE_PROVIDER_SITE_OTHER): Payer: Commercial Managed Care - PPO | Admitting: Psychiatry

## 2023-07-31 ENCOUNTER — Ambulatory Visit: Payer: Self-pay

## 2023-07-31 VITALS — BP 122/76 | HR 93 | Ht 66.0 in

## 2023-07-31 DIAGNOSIS — M7061 Trochanteric bursitis, right hip: Secondary | ICD-10-CM

## 2023-07-31 DIAGNOSIS — M9903 Segmental and somatic dysfunction of lumbar region: Secondary | ICD-10-CM

## 2023-07-31 DIAGNOSIS — F4310 Post-traumatic stress disorder, unspecified: Secondary | ICD-10-CM | POA: Diagnosis not present

## 2023-07-31 DIAGNOSIS — M9902 Segmental and somatic dysfunction of thoracic region: Secondary | ICD-10-CM

## 2023-07-31 DIAGNOSIS — M9904 Segmental and somatic dysfunction of sacral region: Secondary | ICD-10-CM

## 2023-07-31 DIAGNOSIS — M7601 Gluteal tendinitis, right hip: Secondary | ICD-10-CM

## 2023-07-31 DIAGNOSIS — M25552 Pain in left hip: Secondary | ICD-10-CM

## 2023-07-31 NOTE — Assessment & Plan Note (Signed)
Exam showed irritation again.  Discussed with patient about icing regimen.  Discussed with patient about hip abductor strengthening but patient has not had a lot of improvement secondary to having more anxiety and life at the moment.  Follow-up with me again 6 to 8 weeks

## 2023-07-31 NOTE — Progress Notes (Signed)
      [facility] Counselor/Therapist Progress Note  Patient ID: Barbara Wood, MRN: [id number],    Date: 07/31/2023  Time Spent: 51 minutes start time 3:06 PM end time 3:57 PM  Treatment Type: Individual Therapy  Reported Symptoms: anxiety chronic pain, triggered responses  Mental Status Exam:  Appearance:   Well Groomed     Behavior:  Appropriate  Motor:  Normal  Speech/Language:   Normal Rate  Affect:  Appropriate  Mood:  normal  Thought process:  normal  Thought content:    WNL  Sensory/Perceptual disturbances:    pain  Orientation:  oriented to person, place, time/date, and situation  Attention:  Good  Concentration:  Good  Memory:  WNL  Fund of knowledge:   Good  Insight:    Good  Judgment:   Good  Impulse Control:  Good   Risk Assessment: Danger to Self:  No Self-injurious Behavior: No Danger to Others: No Duty to Warn:no Physical Aggression / Violence:No  Access to Firearms a concern: No  Gang Involvement:No   Subjective: Patient was present for session. She shared tha tthings have been overwhelming. She shared explained that work has been a lot and her dog has had a broken toe and it led to him having an infection. She explained that she had to have an injection earlier.  And was still having lots of pain and dealing with these issues.  Patient explained she is recognizing that she has not been able to work on her self-care and that she is going to have to make that a priority.  Different ways to help her do that were discussed in session.  Developed treatment plan and set goals with patient.  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  Discussed ways to put.  And place for her so that she can make sure she stays on target.  Also discussed the facts about the situation.  Interventions: Solution-Oriented/Positive Psychology  Diagnosis:   ICD-10-CM   1. PTSD (post-traumatic stress disorder)  F43.10       Plan: Patient is to use coping  skills to decrease triggered responses.  Patient is to use self spotting as needed to decrease anxiety and rumination.  Patient is to work on diaphragmatic breathing safe place exercise and stretching/yoga throughout the day.  Patient is to focus on what she can control fix and change.  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  Patient is to allow processing to continue between sessions .  Patient is to continue working on limit setting with daughter and ex-husband.  Patient is to release negative emotions through exercise    [name], [facility]

## 2023-07-31 NOTE — Assessment & Plan Note (Signed)
Chronic problem.  Exacerbation seems to be more of the piriformis on exam but with ultrasound there was hypoechoic changes in the gluteal tendon sheath.  Given injection in this area.  Patient was able to get up without any significant difficulty with no weakness of the lower extremity.  Discussed with patient about icing regimen and home exercises, discussed avoiding certain activities.  Will follow-up again in 6 to 8 weeks.

## 2023-07-31 NOTE — Patient Instructions (Signed)
Good to see you! Hopefully Life slows down See you again in 5-6 weeks

## 2023-08-05 ENCOUNTER — Other Ambulatory Visit: Payer: Self-pay | Admitting: Internal Medicine

## 2023-08-05 DIAGNOSIS — M7918 Myalgia, other site: Secondary | ICD-10-CM

## 2023-08-05 DIAGNOSIS — G43909 Migraine, unspecified, not intractable, without status migrainosus: Secondary | ICD-10-CM

## 2023-08-06 ENCOUNTER — Other Ambulatory Visit (HOSPITAL_COMMUNITY): Payer: Self-pay

## 2023-08-06 ENCOUNTER — Other Ambulatory Visit: Payer: Self-pay

## 2023-08-06 MED ORDER — TRAMADOL HCL 50 MG PO TABS
100.0000 mg | ORAL_TABLET | Freq: Two times a day (BID) | ORAL | 0 refills | Status: DC
Start: 2023-08-06 — End: 2023-12-17
  Filled 2023-08-06: qty 360, 90d supply, fill #0

## 2023-08-08 ENCOUNTER — Other Ambulatory Visit (HOSPITAL_COMMUNITY): Payer: Self-pay

## 2023-08-08 DIAGNOSIS — M50221 Other cervical disc displacement at C4-C5 level: Secondary | ICD-10-CM | POA: Diagnosis not present

## 2023-08-08 DIAGNOSIS — M50223 Other cervical disc displacement at C6-C7 level: Secondary | ICD-10-CM | POA: Diagnosis not present

## 2023-08-08 DIAGNOSIS — M47814 Spondylosis without myelopathy or radiculopathy, thoracic region: Secondary | ICD-10-CM | POA: Diagnosis not present

## 2023-08-08 DIAGNOSIS — G959 Disease of spinal cord, unspecified: Secondary | ICD-10-CM | POA: Diagnosis not present

## 2023-08-08 DIAGNOSIS — M4802 Spinal stenosis, cervical region: Secondary | ICD-10-CM | POA: Diagnosis not present

## 2023-08-08 DIAGNOSIS — M47812 Spondylosis without myelopathy or radiculopathy, cervical region: Secondary | ICD-10-CM | POA: Diagnosis not present

## 2023-08-08 MED ORDER — BACLOFEN 5 MG PO TABS
5.0000 mg | ORAL_TABLET | Freq: Three times a day (TID) | ORAL | 3 refills | Status: DC | PRN
  Filled 2023-08-08 – 2023-08-27 (×2): qty 180, 30d supply, fill #0
  Filled 2023-10-22: qty 180, 30d supply, fill #1
  Filled 2024-01-19: qty 180, 30d supply, fill #2
  Filled 2024-03-15: qty 180, 30d supply, fill #3

## 2023-08-12 ENCOUNTER — Other Ambulatory Visit: Payer: Self-pay | Admitting: Internal Medicine

## 2023-08-12 DIAGNOSIS — G4700 Insomnia, unspecified: Secondary | ICD-10-CM

## 2023-08-13 ENCOUNTER — Other Ambulatory Visit (HOSPITAL_COMMUNITY): Payer: Self-pay

## 2023-08-13 MED ORDER — TRAZODONE HCL 50 MG PO TABS
50.0000 mg | ORAL_TABLET | Freq: Every evening | ORAL | 1 refills | Status: DC | PRN
Start: 2023-08-13 — End: 2023-10-08
  Filled 2023-08-13 – 2023-08-19 (×2): qty 60, 30d supply, fill #0
  Filled 2023-09-12: qty 60, 30d supply, fill #1

## 2023-08-13 NOTE — Progress Notes (Unsigned)
   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   At her last visit she was started on Vyepti for migraine prevention. [name] was continued for rescue. Interval History: ***   Headache days per month: *** Migraine days per month*** Headache free days per month: ***  Current Headache Regimen: Preventative: *** Abortive: ***   Prior Therapies                                  Preventive: Topamax 100 mg PRN - word finding difficulty Keppra Atenolol 100 mg BID - lack of efficacy Verapamil - worsened migraines Amitriptyline Cymbalta - nausea and vomiting Lyrica - vertigo Emgality 120 mg monthly Aimovig 140 mg monthly Botox   Rescue: Imitrex - palpitations 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 [name] 75 mg PRN Ubrelvy 100 mg PRN - lack of efficacy Skelaxin Phergan Norco  Physical Exam:   Vital Signs: LMP 10/31/2019  GENERAL:  well appearing, in no acute distress, alert  SKIN:  Color, texture, turgor normal. No rashes or lesions HEAD:  Normocephalic/atraumatic. RESP: normal respiratory effort MSK:  No gross joint deformities.   NEUROLOGICAL: Mental Status: Alert, oriented to person, place and time, Follows commands, and Speech fluent and appropriate. Cranial Nerves: PERRL, face symmetric, no dysarthria, hearing grossly intact Motor: moves all extremities equally Gait: normal-based.  IMPRESSION: ***  PLAN: ***   Follow-up: ***  I spent a total of *** minutes on the date of the service. Headache education was done. Discussed lifestyle modification including increased oral hydration, decreased caffeine, exercise and stress management. Discussed treatment options including preventive and acute medications, natural supplements, and infusion therapy.  Discussed medication overuse headache and to limit use of acute treatments to no more than 2 days/week or 10 days/month. Discussed medication side effects, adverse reactions and drug interactions. Written educational materials and patient instructions outlining all of the above were given.  Ocie Doyne, MD

## 2023-08-14 ENCOUNTER — Encounter: Payer: Self-pay | Admitting: Psychiatry

## 2023-08-14 ENCOUNTER — Ambulatory Visit: Payer: Commercial Managed Care - PPO | Admitting: Psychiatry

## 2023-08-14 ENCOUNTER — Other Ambulatory Visit (HOSPITAL_COMMUNITY): Payer: Self-pay

## 2023-08-14 VITALS — BP 123/79 | HR 74 | Ht 67.0 in | Wt 199.8 lb

## 2023-08-14 DIAGNOSIS — G43119 Migraine with aura, intractable, without status migrainosus: Secondary | ICD-10-CM

## 2023-08-14 MED ORDER — MECLIZINE HCL 12.5 MG PO TABS
12.5000 mg | ORAL_TABLET | Freq: Three times a day (TID) | ORAL | 0 refills | Status: DC | PRN
  Filled 2023-08-14: qty 10, 4d supply, fill #0

## 2023-08-14 MED ORDER — NURTEC 75 MG PO TBDP
1.0000 | ORAL_TABLET | Freq: Every day | ORAL | 6 refills | Status: DC | PRN
  Filled 2023-08-14: qty 8, 30d supply, fill #0
  Filled 2023-09-23: qty 8, 30d supply, fill #1
  Filled 2023-12-18: qty 8, 30d supply, fill #2
  Filled 2024-01-19: qty 8, 30d supply, fill #3
  Filled 2024-03-10: qty 8, 30d supply, fill #4

## 2023-08-15 ENCOUNTER — Other Ambulatory Visit (HOSPITAL_COMMUNITY): Payer: Self-pay

## 2023-08-15 ENCOUNTER — Telehealth: Payer: Self-pay | Admitting: *Deleted

## 2023-08-15 ENCOUNTER — Ambulatory Visit (INDEPENDENT_AMBULATORY_CARE_PROVIDER_SITE_OTHER): Payer: Commercial Managed Care - PPO | Admitting: Psychiatry

## 2023-08-15 DIAGNOSIS — F4310 Post-traumatic stress disorder, unspecified: Secondary | ICD-10-CM | POA: Diagnosis not present

## 2023-08-15 NOTE — Telephone Encounter (Signed)
Per Dr. Delena Bali " Increase Vyepti to 300 mg every 3 months "  Order placed on her desk to be signed.

## 2023-08-15 NOTE — Progress Notes (Signed)
      [facility] Counselor/Therapist Progress Note  Patient ID: Barbara Wood, MRN: [id number],    Date: 08/15/2023  Time Spent: 45 minutes start time 2:58 PM end time 3:43 PM  Treatment Type: Individual Therapy  Reported Symptoms: sadness, anxiety  Mental Status Exam:  Appearance:   Well Groomed     Behavior:  Appropriate  Motor:  Normal  Speech/Language:   Normal Rate  Affect:  Appropriate  Mood:  normal  Thought process:  normal  Thought content:    WNL  Sensory/Perceptual disturbances:    WNL  Orientation:  oriented to person, place, time/date, and situation  Attention:  Good  Concentration:  Good  Memory:  WNL  Fund of knowledge:   Good  Insight:    Good  Judgment:   Good  Impulse Control:  Good   Risk Assessment: Danger to Self:  No Self-injurious Behavior: No Danger to Others: No Duty to Warn:no Physical Aggression / Violence:No  Access to Firearms a concern: No  Gang Involvement:No   Subjective: Patient was present for session. She shared that she has ended her current relationship and even though it may have been difficult has been a positive thing for her.  Patient reported that things are still difficult with her daughter.  Discussed the situation with her daughter and her ex and how that dynamic can be very difficult and that seems to be leading to more stress for patient.  Discussed the importance of trying to just continue working on limit setting and recognizing what she can do in the situation.  Patient was encouraged to recognize all the steps that she is taking already and to feel good about those things.  Encouraged her to see if that is able to reduce her stress level and of so she can continue making those choices.  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  Interventions: Solution-Oriented/Positive  Psychology  Diagnosis:   ICD-10-CM   1. PTSD (post-traumatic stress disorder)  F43.10       Plan:  Patient is to use coping skills to decrease triggered responses.  Patient is to use self spotting as needed to decrease anxiety and rumination.  Patient is to work on diaphragmatic breathing safe place exercise and stretching/yoga throughout the day.  Patient is to focus on what she can control fix and change.  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  Patient is to allow processing to continue between sessions .  Patient is to continue working on limit setting with daughter and ex-husband.  Patient is to release negative emotions through exercise    [name], [facility]

## 2023-08-19 ENCOUNTER — Other Ambulatory Visit (HOSPITAL_COMMUNITY): Payer: Self-pay

## 2023-08-20 NOTE — Telephone Encounter (Signed)
  Order given to [facility] in infusion suite.

## 2023-08-21 DIAGNOSIS — H5212 Myopia, left eye: Secondary | ICD-10-CM | POA: Diagnosis not present

## 2023-08-22 ENCOUNTER — Other Ambulatory Visit: Payer: Self-pay | Admitting: Internal Medicine

## 2023-08-22 ENCOUNTER — Other Ambulatory Visit (HOSPITAL_COMMUNITY): Payer: Self-pay

## 2023-08-22 DIAGNOSIS — F988 Other specified behavioral and emotional disorders with onset usually occurring in childhood and adolescence: Secondary | ICD-10-CM

## 2023-08-22 MED ORDER — LISDEXAMFETAMINE DIMESYLATE 50 MG PO CAPS
50.0000 mg | ORAL_CAPSULE | Freq: Every day | ORAL | 0 refills | Status: DC
Start: 2023-08-22 — End: 2023-11-25
  Filled 2023-08-22 – 2023-08-27 (×2): qty 30, 30d supply, fill #0

## 2023-08-22 MED ORDER — LISDEXAMFETAMINE DIMESYLATE 50 MG PO CAPS
50.0000 mg | ORAL_CAPSULE | Freq: Every day | ORAL | 0 refills | Status: DC
Start: 2023-09-21 — End: 2023-11-25
  Filled 2023-08-22 – 2023-09-26 (×2): qty 30, 30d supply, fill #0

## 2023-08-22 MED ORDER — LISDEXAMFETAMINE DIMESYLATE 50 MG PO CAPS
50.0000 mg | ORAL_CAPSULE | Freq: Every day | ORAL | 0 refills | Status: DC
Start: 2023-10-21 — End: 2023-11-25
  Filled 2023-08-22 – 2023-10-30 (×2): qty 30, 30d supply, fill #0

## 2023-08-27 ENCOUNTER — Other Ambulatory Visit: Payer: Self-pay

## 2023-08-27 ENCOUNTER — Other Ambulatory Visit (HOSPITAL_COMMUNITY): Payer: Self-pay

## 2023-08-28 ENCOUNTER — Other Ambulatory Visit (HOSPITAL_COMMUNITY): Payer: Self-pay

## 2023-08-28 ENCOUNTER — Ambulatory Visit (INDEPENDENT_AMBULATORY_CARE_PROVIDER_SITE_OTHER): Payer: Commercial Managed Care - PPO | Admitting: Psychiatry

## 2023-08-28 DIAGNOSIS — F4310 Post-traumatic stress disorder, unspecified: Secondary | ICD-10-CM | POA: Diagnosis not present

## 2023-08-28 NOTE — Progress Notes (Unsigned)
      [facility] Counselor/Therapist Progress Note  Patient ID: Luceal Stockman, MRN: [id number],    Date: 08/28/2023  Time Spent: 44 minutes start time 1:07 PM end time 1:51 PM  Treatment Type: Individual Therapy  Reported Symptoms: Anxiety, procrastination, triggered responses  Mental Status Exam:  Appearance:   Well Groomed     Behavior:  Appropriate  Motor:  Normal  Speech/Language:   Normal Rate  Affect:  Appropriate  Mood:  normal  Thought process:  normal  Thought content:    WNL  Sensory/Perceptual disturbances:    WNL  Orientation:  oriented to person, place, time/date, and situation  Attention:  Good  Concentration:  Good  Memory:  WNL  Fund of knowledge:   Good  Insight:    Good  Judgment:   Good  Impulse Control:  Good   Risk Assessment: Danger to Self:  No Self-injurious Behavior: No Danger to Others: No Duty to Warn:no Physical Aggression / Violence:No  Access to Firearms a concern: No  Gang Involvement:No   Subjective: Patient was present for session. She shared that she she is doing better.  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  Encouraged her to recognize how that works progress and something that she needs to feel positive about.  She was also able to recognize that she is doing better without shot in her life which was another goals completed.  Patient explained that she gets frustrated with her daughter and wants to figure out ways to manage that emotion appropriately.  Encouraged her to think through what was getting triggered.  Patient was able to identify other things and develop some CBT filters and skills to manage those emotions appropriately.  Discussed different parenting strategies to help her change the way that she deals with some of her daughter's behaviors.  Also encouraged her to recognize that her ex-husband is part of the issue because he does not have the same expectations and parenting behavior as patient.  Interventions:  Solution-Oriented/Positive Psychology  Diagnosis:   ICD-10-CM   1. PTSD (post-traumatic stress disorder)  F43.10       Plan: Patient is to use coping skills to decrease triggered responses.  Patient is to follow implants in session to deal with the situation regarding her daughter differently in hopes of decreasing triggered responses and negative emotions.  Patient is to use self spotting as needed to decrease anxiety and rumination.  Patient is to work on diaphragmatic breathing safe place exercise and stretching/yoga throughout the day.  Patient is to focus on what she can control fix and change.  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  Patient is to allow processing to continue between sessions .  Patient is to continue working on limit setting with daughter and ex-husband.  Patient is to release negative emotions through exercise    [name], [facility]

## 2023-09-11 ENCOUNTER — Ambulatory Visit: Payer: Commercial Managed Care - PPO | Admitting: Psychiatry

## 2023-09-11 DIAGNOSIS — G43E19 Chronic migraine with aura, intractable, without status migrainosus: Secondary | ICD-10-CM | POA: Diagnosis not present

## 2023-09-11 NOTE — Progress Notes (Unsigned)
[name] [address] [place] [address] Phone: [phone] Subjective:   [name], am serving as a scribe for Dr. Antoine Primas.  I'm seeing this patient by the request  of:  Philip Aspen, Limmie Patricia, MD  CC: back and neck pain follow up   [id number]  Akeiba Lubrano is a 53 y.o. female coming in with complaint of back and neck pain. OMT on 07/31/2023. Patient states that she just traveled to [place], [place]. Neck is tight.   Medications patient has been prescribed:   Taking:         Reviewed prior external information including notes and imaging from previsou exam, outside providers and external EMR if available.   As well as notes that were available from care everywhere and other healthcare systems.  Past medical history, social, surgical and family history all reviewed in electronic medical record.  No pertanent information unless stated regarding to the chief complaint.   Past Medical History:  Diagnosis Date   Allergy Age 77   Arthritis    shoulders, rt hip   Asthma Since age 67   Depression Age 7   Family history of adverse reaction to anesthesia    mother and sister   GERD (gastroesophageal reflux disease)    Hashimoto's disease    Heart murmur Mitral valve prolapse age 41   Migraine    PONV (postoperative nausea and vomiting)    Pre-diabetes    Psoriasis    Thyroid disease Since age 4    Allergies  Allergen Reactions   Amoxicillin-Pot Clavulanate Nausea And Vomiting    Augmentin causes nausea and vomiting   Sertraline Other (See Comments)    hallucinations    Sulfamethoxazole-Trimethoprim Hives   Triptans Other (See Comments)    Chest pain      Review of Systems:  No headache, visual changes, nausea, vomiting, diarrhea, constipation, dizziness, abdominal pain, skin rash, fevers, chills, night sweats, weight loss, swollen lymph nodes, body aches, joint swelling, chest pain, shortness of breath,  mood changes. POSITIVE muscle aches  Objective  Blood pressure 118/82, pulse 81, height 5\' 7"  (1.702 m), last menstrual period 10/31/2019, SpO2 97%.   General: No apparent distress alert and oriented x3 mood and affect normal, dressed appropriately.  HEENT: Pupils equal, extraocular movements intact  Respiratory: Patient's speak in full sentences and does not appear short of breath  Cardiovascular: No lower extremity edema, non tender, no erythema  Significant increase in tightness noted in the neck as well as in the paraspinal musculature of the lumbar spine.  Positive tightness noted at the occipital region it appears as well today.  Osteopathic findings  C2 flexed rotated and side bent right C5 flexed rotated and side bent left T3 extended rotated and side bent right inhaled rib T9 extended rotated and side bent left L2 flexed rotated and side bent right Sacrum right on right       Assessment and Plan:  Right hip pain Increasing discomfort especially over the sacroiliac joint on the right side.  We discussed with patient to continue to be active.  Patient did do traveling and likely is what is exacerbating the discomfort at the moment.  Increase activity slowly.  Follow-up again in 6 to 8 weeks.    Nonallopathic problems  Decision today to treat with OMT was based on Physical Exam  After verbal consent patient was treated with HVLA, ME, FPR techniques in cervical, rib, thoracic, lumbar, and sacral  areas avoided HVLA on patient's neck secondary to history of surgery.  Patient tolerated the procedure well with improvement in symptoms  Patient given exercises, stretches and lifestyle modifications  See medications in patient instructions if given  Patient will follow up in 4-8 weeks    The above documentation has been reviewed and is accurate and complete Judi Saa, DO          Note: This dictation was prepared with Dragon dictation along with smaller  phrase technology. Any transcriptional errors that result from this process are unintentional.

## 2023-09-12 ENCOUNTER — Ambulatory Visit: Payer: Commercial Managed Care - PPO | Admitting: Family Medicine

## 2023-09-12 ENCOUNTER — Other Ambulatory Visit (HOSPITAL_COMMUNITY): Payer: Self-pay

## 2023-09-12 ENCOUNTER — Encounter: Payer: Self-pay | Admitting: Family Medicine

## 2023-09-12 VITALS — BP 118/82 | HR 81 | Ht 67.0 in

## 2023-09-12 DIAGNOSIS — M25551 Pain in right hip: Secondary | ICD-10-CM

## 2023-09-12 DIAGNOSIS — M9908 Segmental and somatic dysfunction of rib cage: Secondary | ICD-10-CM | POA: Diagnosis not present

## 2023-09-12 DIAGNOSIS — M9901 Segmental and somatic dysfunction of cervical region: Secondary | ICD-10-CM | POA: Diagnosis not present

## 2023-09-12 DIAGNOSIS — M9902 Segmental and somatic dysfunction of thoracic region: Secondary | ICD-10-CM | POA: Diagnosis not present

## 2023-09-12 DIAGNOSIS — M9903 Segmental and somatic dysfunction of lumbar region: Secondary | ICD-10-CM | POA: Diagnosis not present

## 2023-09-12 DIAGNOSIS — M9904 Segmental and somatic dysfunction of sacral region: Secondary | ICD-10-CM

## 2023-09-12 NOTE — Assessment & Plan Note (Signed)
Increasing discomfort especially over the sacroiliac joint on the right side.  We discussed with patient to continue to be active.  Patient did do traveling and likely is what is exacerbating the discomfort at the moment.  Increase activity slowly.  Follow-up again in 6 to 8 weeks.

## 2023-09-18 DIAGNOSIS — L84 Corns and callosities: Secondary | ICD-10-CM | POA: Diagnosis not present

## 2023-09-18 DIAGNOSIS — M21961 Unspecified acquired deformity of right lower leg: Secondary | ICD-10-CM | POA: Diagnosis not present

## 2023-09-18 DIAGNOSIS — M21962 Unspecified acquired deformity of left lower leg: Secondary | ICD-10-CM | POA: Diagnosis not present

## 2023-09-18 DIAGNOSIS — M2041 Other hammer toe(s) (acquired), right foot: Secondary | ICD-10-CM | POA: Diagnosis not present

## 2023-09-23 ENCOUNTER — Other Ambulatory Visit (HOSPITAL_COMMUNITY): Payer: Self-pay

## 2023-09-25 ENCOUNTER — Other Ambulatory Visit (HOSPITAL_COMMUNITY): Payer: Self-pay

## 2023-09-26 ENCOUNTER — Other Ambulatory Visit (HOSPITAL_COMMUNITY): Payer: Self-pay

## 2023-10-02 ENCOUNTER — Ambulatory Visit: Payer: Commercial Managed Care - PPO | Admitting: Family Medicine

## 2023-10-02 ENCOUNTER — Encounter: Payer: Self-pay | Admitting: Family Medicine

## 2023-10-02 ENCOUNTER — Other Ambulatory Visit (HOSPITAL_COMMUNITY): Payer: Self-pay

## 2023-10-02 VITALS — BP 122/82 | HR 92 | Temp 98.3°F | Wt 198.6 lb

## 2023-10-02 DIAGNOSIS — J4521 Mild intermittent asthma with (acute) exacerbation: Secondary | ICD-10-CM | POA: Insufficient documentation

## 2023-10-02 DIAGNOSIS — J028 Acute pharyngitis due to other specified organisms: Secondary | ICD-10-CM

## 2023-10-02 LAB — POCT RAPID STREP A (OFFICE): Rapid Strep A Screen: NEGATIVE

## 2023-10-02 LAB — POC COVID19 BINAXNOW: SARS Coronavirus 2 Ag: NEGATIVE

## 2023-10-02 MED ORDER — DOXYCYCLINE HYCLATE 100 MG PO TABS
100.0000 mg | ORAL_TABLET | Freq: Two times a day (BID) | ORAL | 0 refills | Status: DC
  Filled 2023-10-02: qty 20, 10d supply, fill #0

## 2023-10-02 MED ORDER — METHYLPREDNISOLONE ACETATE 80 MG/ML IJ SUSP
80.0000 mg | Freq: Once | INTRAMUSCULAR | Status: AC
Start: 2023-10-02 — End: 2023-10-02
  Administered 2023-10-02: 80 mg via INTRAMUSCULAR

## 2023-10-02 MED ORDER — PREDNISONE 20 MG PO TABS
40.0000 mg | ORAL_TABLET | Freq: Every day | ORAL | 0 refills | Status: DC
Start: 2023-10-03 — End: 2023-10-02
  Filled 2023-10-02: qty 8, 4d supply, fill #0

## 2023-10-02 MED ORDER — DOXYCYCLINE HYCLATE 100 MG PO TABS: 100.0000 mg | ORAL_TABLET | Freq: Two times a day (BID) | ORAL | 0 refills | Status: DC

## 2023-10-02 MED ORDER — PREDNISONE 20 MG PO TABS: 40.0000 mg | ORAL_TABLET | Freq: Every day | ORAL | 0 refills | Status: AC

## 2023-10-02 NOTE — Patient Instructions (Addendum)
Return in about 2 weeks (around 10/16/2023), or if symptoms worsen or fail to improve.        Great to see you today.  I have refilled the medication(s) we provide.   If labs were collected or images ordered, we will inform you of  results once we have received them and reviewed. We will contact you either by echart message, or telephone call.  Please give ample time to the testing facility, and our office to run,  receive and review results. Please do not call inquiring of results, even if you can see them in your chart. We will contact you as soon as we are able. If it has been over 1 week since the test was completed, and you have not yet heard from [place], then please call us.    - echart message- for normal results that have been seen by the patient already.   - telephone call: abnormal results or if patient has not viewed results in their echart.  If a referral to a specialist was entered for you, please call us in 2 weeks if you have not heard from the specialist office to schedule.

## 2023-10-02 NOTE — Progress Notes (Signed)
Barbara Wood , 1970-02-25, 53 y.o., female MRN: [id number] Patient Care Team    Relationship Specialty Notifications Start End  Philip Aspen, Limmie Patricia, MD PCP - General Internal Medicine  08/23/21   Tanda Rockers, NP Nurse Practitioner Radiology  06/21/22     Chief Complaint  Patient presents with   Hoarse    Woke up this morning; had asthma attack yesterday; neg covid on Sunday and Monday; day 5 of sx onset- cough,sore throat, post nasal drainage     Subjective: Barbara Wood is a 53 y.o. Pt presents for an OV with complaints of, sore throat, postnasal drip of 5 days duration.  Associated symptoms include patient endorses having an asthma attack yesterday.  She has been using her albuterol inhaler 6 times yesterday and once this morning.  She is taking her Symbicort daily inhaler as prescribed.  She reports this morning she woke up hoarse and did not have a voice. Allergic to amox and sulfa. H/o asthma. Pt has tried inhalers to ease their symptoms.      06/20/2023    3:08 PM 12/28/2022    1:49 PM 12/18/2022    8:11 AM 09/11/2022    3:37 PM 05/23/2022    2:09 PM  Depression screen PHQ 2/9  Decreased Interest 1 2 0 1 0  Down, Depressed, Hopeless 1 3 0 0 0  PHQ - 2 Score 2 5 0 1 0  Altered sleeping 1 2 1 1 1   Tired, decreased energy 1 2 3 1 1   Change in appetite 2 1 0 1 0  Feeling bad or failure about yourself  1 2 0 0 0  Trouble concentrating 0 3 0 0 0  Moving slowly or fidgety/restless 0 1 0 0 0  Suicidal thoughts 0 1 0 0 0  PHQ-9 Score 7 17 4 4 2   Difficult doing work/chores  Very difficult  Not difficult at all     Allergies  Allergen Reactions   Amoxicillin-Pot Clavulanate Nausea And Vomiting    Augmentin causes nausea and vomiting   Sertraline Other (See Comments)    hallucinations    Sulfamethoxazole-Trimethoprim Hives   Triptans Other (See Comments)    Chest pain    Social History   Social History Narrative   Not on file   Past Medical  History:  Diagnosis Date   Allergy Age 53   Arthritis    shoulders, rt hip   Asthma Since age 53   Depression Age 53   Family history of adverse reaction to anesthesia    mother and sister   GERD (gastroesophageal reflux disease)    Hashimoto's disease    Heart murmur Mitral valve prolapse age 53   Migraine    PONV (postoperative nausea and vomiting)    Pre-diabetes    Psoriasis    Thyroid disease Since age 53   Past Surgical History:  Procedure Laterality Date   BREAST SURGERY  Reduction age 51   CERVICAL DISC ARTHROPLASTY N/A 11/27/2022   Procedure: ARTIFICIAL DISC REPLACEMENT Cervical five-six;  Surgeon: Bethann Goo, DO;  Location: [facility] OR;  Service: Neurosurgery;  Laterality: N/A;   CHOLECYSTECTOMY  Age 53   KNEE ARTHROSCOPY W/ LATERAL RELEASE Right    REDUCTION MAMMAPLASTY     WISDOM TOOTH EXTRACTION     Family History  Problem Relation Age of Onset   Cancer Mother    Depression Mother    Hashimoto's thyroiditis Mother    Diabetes  Mother    Bipolar disorder Mother    Hypertension Father    Ankylosing spondylitis Father    Psoriasis Father    Asthma Sister    Miscarriages / [place] Sister    Depression Sister    Psoriasis Sister    Psoriasis Sister    Arthritis Sister        psoriatic arthritis    Psoriasis Sister    Rheum arthritis Sister    Depression Sister    Anxiety disorder Sister    Eczema Sister    Psoriasis Brother    Migraines Brother    ADD / ADHD Brother    Eczema Brother    Allergies Brother    Psoriasis Brother    Psoriasis Maternal Aunt    ADD / ADHD Daughter    Eczema Daughter    Asthma Daughter    Hypermobility Daughter    Allergies as of 10/02/2023       Reactions   Amoxicillin-pot Clavulanate Nausea And Vomiting   Augmentin causes nausea and vomiting   Sertraline Other (See Comments)   hallucinations    Sulfamethoxazole-trimethoprim Hives   Triptans Other (See Comments)   Chest pain        Medication List         Accurate as of October 02, 2023  1:31 PM. If you have any questions, ask your nurse or doctor.          STOP taking these medications    Carestart COVID-19 Home Test Kit Generic drug: COVID-19 At Home Antigen Test Stopped by: Felix Pacini   predniSONE 10 MG (21) Tbpk tablet Commonly known as: STERAPRED UNI-PAK 21 TAB Replaced by: predniSONE 20 MG tablet Stopped by: Felix Pacini       TAKE these medications    albuterol 108 (90 Base) MCG/ACT inhaler Commonly known as: ProAir HFA Inhale 2 puffs into the lungs every 6 (six) hours as needed for wheezing or shortness of breath.   albuterol 108 (90 Base) MCG/ACT inhaler Commonly known as: VENTOLIN HFA Inhale 2 puffs into the lungs every 6 (six) hours as needed for wheezing or shortness of breath   ARIPiprazole 10 MG tablet Commonly known as: ABILIFY Take 1 tablet (10 mg total) by mouth daily.   atenolol 25 MG tablet Commonly known as: TENORMIN Take 50 mg by mouth daily.   Baclofen 5 MG Tabs Take 1 tablet (5 mg total) by mouth 2 (two) times daily as needed for spasms   Baclofen 5 MG Tabs Take 1 tablet (5 mg total) by mouth 3 (three) times daily as needed for spasms.   Baclofen 5 MG Tabs Take 1-2 tablets (5-10 mg total) by mouth 3 (three) times daily as needed.   betamethasone dipropionate 0.05 % cream Apply topically 2 (two) times daily.   betamethasone dipropionate 0.05 % lotion Apply 1 application topically to the scalp daily. Taper use as able.   budesonide-formoterol 80-4.5 MCG/ACT inhaler Commonly known as: SYMBICORT Inhale 2 puffs into the lungs 2 (two) times daily.   cetirizine 10 MG tablet Commonly known as: ZYRTEC Take 1 tablet by mouth daily.   diclofenac 25 MG EC tablet Commonly known as: VOLTAREN Take 75 mg by mouth 2 (two) times daily.   doxycycline 100 MG tablet Commonly known as: VIBRA-TABS Take 1 tablet (100 mg total) by mouth 2 (two) times daily. Started by: Felix Pacini    HYDROcodone-acetaminophen 5-325 MG tablet Commonly known as: NORCO/VICODIN Take 1 tablet by mouth every 6 (six)  hours as needed for pain.   lidocaine 5 % Commonly known as: Lidoderm Place 1 patch onto the skin daily. Remove & Discard patch within 12 hours or as directed by MD   liothyronine 5 MCG tablet Commonly known as: CYTOMEL Take 1 tablet (5 mcg total) by mouth in the morning, at noon, and at bedtime.   lisdexamfetamine 50 MG capsule Commonly known as: Vyvanse Take 1 capsule (50 mg total) by mouth daily.   lisdexamfetamine 50 MG capsule Commonly known as: Vyvanse Take 1 capsule (50 mg total) by mouth daily.   lisdexamfetamine 50 MG capsule Commonly known as: Vyvanse Take 1 capsule (50 mg total) by mouth daily. Start taking on: October 21, 2023   meclizine 12.5 MG tablet Commonly known as: ANTIVERT Take 1 tablet (12.5 mg total) by mouth 3 (three) times daily as needed for dizziness.   metaxalone 800 MG tablet Commonly known as: Skelaxin Take 1 tablet (800 mg total) by mouth 3 (three) times daily.   montelukast 10 MG tablet Commonly known as: SINGULAIR Take 1 tablet (10 mg total) by mouth at bedtime.   NON FORMULARY Vyepti infusions for migraine   [name] 75 MG Tbdp Generic drug: Rimegepant Sulfate Take 1 tablet (75 mg total) by mouth daily as needed (migraine). Max dose 1 pill in 24 hours   omeprazole 20 MG capsule Commonly known as: PRILOSEC Take 1 capsule (20 mg total) by mouth daily.   predniSONE 20 MG tablet Commonly known as: DELTASONE Take 2 tablets (40 mg total) by mouth daily with breakfast for 4 days. Replaces: predniSONE 10 MG (21) Tbpk tablet Started by: Felix Pacini   Premarin vaginal cream Generic drug: conjugated estrogens Place 0.5 g vaginally 3 (three) times a week.   progesterone 100 MG capsule Commonly known as: PROMETRIUM Take 1 capsule (100 mg total) by mouth daily.   promethazine 25 MG tablet Commonly known as: PHENERGAN Take 1  tablet (25 mg total) by mouth every 6 (six) hours as needed for nausea or vomiting.   RHINOCORT ALLERGY NA Place 1 spray into both nostrils 2 (two) times daily.   Synthroid 100 MCG tablet Generic drug: levothyroxine Take 1 tablet (100 mcg total) by mouth daily.   traMADol 50 MG tablet Commonly known as: ULTRAM Take 2 tablets (100 mg total) by mouth 2 (two) times daily.   traZODone 50 MG tablet Commonly known as: DESYREL Take 1-2 tablets (50-100 mg total) by mouth at bedtime as needed for sleep.   Vitamin D3 250 MCG (10000 UT) capsule Take 10,000 Units by mouth daily.        All past medical history, surgical history, allergies, family history, immunizations andmedications were updated in the EMR today and reviewed under the history and medication portions of their EMR.     Review of Systems  Constitutional:  Positive for malaise/fatigue. Negative for chills and fever.  Gastrointestinal:  Negative for diarrhea, nausea and vomiting.  Musculoskeletal:  Negative for myalgias.  Neurological:  Negative for dizziness and headaches.   Negative, with the exception of above mentioned in HPI   Objective:  BP 122/82   Pulse 92   Temp 98.3 F (36.8 C)   Wt 198 lb 9.6 oz (90.1 kg)   LMP 10/31/2019   SpO2 98%   BMI 31.11 kg/m  Body mass index is 31.11 kg/m. Physical Exam Vitals and nursing note reviewed.  Constitutional:      General: She is not in acute distress.    Appearance: Normal  appearance. She is not ill-appearing, toxic-appearing or diaphoretic.  HENT:     Head: Normocephalic and atraumatic.     Right Ear: Tympanic membrane, ear canal and external ear normal.     Left Ear: Tympanic membrane, ear canal and external ear normal.     Nose: Congestion and rhinorrhea present.     Mouth/Throat:     Mouth: Mucous membranes are moist.     Pharynx: Posterior oropharyngeal erythema present. No oropharyngeal exudate.  Eyes:     General: No scleral icterus.       Right eye:  No discharge.        Left eye: No discharge.     Extraocular Movements: Extraocular movements intact.     Conjunctiva/sclera: Conjunctivae normal.     Pupils: Pupils are equal, round, and reactive to light.  Cardiovascular:     Rate and Rhythm: Normal rate and regular rhythm.  Pulmonary:     Effort: Pulmonary effort is normal. No respiratory distress.     Breath sounds: Rhonchi present. No wheezing or rales.     Comments: Cough present Musculoskeletal:     Cervical back: Neck supple.     Right lower leg: No edema.     Left lower leg: No edema.  Lymphadenopathy:     Cervical: Cervical adenopathy present.  Skin:    General: Skin is warm.     Findings: No rash.  Neurological:     Mental Status: She is alert and oriented to person, place, and time. Mental status is at baseline.     Motor: No weakness.     Gait: Gait normal.  Psychiatric:        Mood and Affect: Mood normal.        Behavior: Behavior normal.        Thought Content: Thought content normal.        Judgment: Judgment normal.      No results found. No results found. Results for orders placed or performed in visit on 10/02/23 (from the past 24 hour(s))  POC COVID-19 BinaxNow     Status: None   Collection Time: 10/02/23  1:26 PM  Result Value Ref Range   SARS Coronavirus 2 Ag Negative Negative  POCT rapid strep A     Status: None   Collection Time: 10/02/23  1:26 PM  Result Value Ref Range   Rapid Strep A Screen Negative Negative    Assessment/Plan: Barbara Wood is a 53 y.o. female present for OV for  Pharyngitis due to other organism - POC COVID-19 BinaxNow> negative - POCT rapid strep A> negative - methylPREDNISolone acetate (DEPO-MEDROL) injection 80 mg Mild intermittent asthma with exacerbation Rest, hydrate.  mucinex (DM if cough), nettie pot or nasal saline.  Doxy bid prescribed, take until completed.  Prednisone to start tomorrow for 4 days, IM dep omedrol 80 inj today Continue albuterol prn and  symbicort as prescribed If cough present it can last up to 6-8 weeks.  F/U 2 weeks if not improved w/PCP - methylPREDNISolone acetate (DEPO-MEDROL) injection 80 mg  Reviewed expectations re: course of current medical issues. Discussed self-management of symptoms. Outlined signs and symptoms indicating need for more acute intervention. Patient verbalized understanding and all questions were answered. Patient received an After-Visit Summary.    Orders Placed This Encounter  Procedures   POC COVID-19 BinaxNow   POCT rapid strep A   Meds ordered this encounter  Medications   DISCONTD: predniSONE (DELTASONE) 20 MG tablet    Sig:  Take 2 tablets (40 mg total) by mouth daily with breakfast for 4 days.    Dispense:  8 tablet    Refill:  0   DISCONTD: doxycycline (VIBRA-TABS) 100 MG tablet    Sig: Take 1 tablet (100 mg total) by mouth 2 (two) times daily.    Dispense:  20 tablet    Refill:  0   doxycycline (VIBRA-TABS) 100 MG tablet    Sig: Take 1 tablet (100 mg total) by mouth 2 (two) times daily.    Dispense:  20 tablet    Refill:  0   predniSONE (DELTASONE) 20 MG tablet    Sig: Take 2 tablets (40 mg total) by mouth daily with breakfast for 4 days.    Dispense:  8 tablet    Refill:  0   methylPREDNISolone acetate (DEPO-MEDROL) injection 80 mg   Referral Orders  No referral(s) requested today     Note is dictated utilizing voice recognition software. Although note has been proof read prior to signing, occasional typographical errors still can be missed. If any questions arise, please do not hesitate to call for verification.   electronically signed by:  Felix Pacini, DO  [facility] - OR

## 2023-10-08 ENCOUNTER — Other Ambulatory Visit: Payer: Self-pay | Admitting: Internal Medicine

## 2023-10-08 ENCOUNTER — Other Ambulatory Visit (HOSPITAL_COMMUNITY): Payer: Self-pay

## 2023-10-08 DIAGNOSIS — M79643 Pain in unspecified hand: Secondary | ICD-10-CM

## 2023-10-08 DIAGNOSIS — B0229 Other postherpetic nervous system involvement: Secondary | ICD-10-CM

## 2023-10-08 DIAGNOSIS — G4700 Insomnia, unspecified: Secondary | ICD-10-CM

## 2023-10-09 ENCOUNTER — Other Ambulatory Visit (HOSPITAL_COMMUNITY): Payer: Self-pay

## 2023-10-09 MED ORDER — HYDROCODONE-ACETAMINOPHEN 5-325 MG PO TABS
1.0000 | ORAL_TABLET | Freq: Four times a day (QID) | ORAL | 0 refills | Status: AC | PRN
Start: 2023-10-09 — End: ?
  Filled 2023-10-09: qty 60, 15d supply, fill #0

## 2023-10-11 ENCOUNTER — Other Ambulatory Visit (HOSPITAL_COMMUNITY): Payer: Self-pay

## 2023-10-11 ENCOUNTER — Other Ambulatory Visit: Payer: Self-pay

## 2023-10-11 MED ORDER — ARIPIPRAZOLE 10 MG PO TABS
10.0000 mg | ORAL_TABLET | Freq: Every day | ORAL | 0 refills | Status: DC
  Filled 2023-10-11: qty 90, 90d supply, fill #0

## 2023-10-11 NOTE — Telephone Encounter (Signed)
Pt is aware md out of office today 

## 2023-10-14 ENCOUNTER — Other Ambulatory Visit: Payer: Self-pay | Admitting: Internal Medicine

## 2023-10-14 ENCOUNTER — Other Ambulatory Visit: Payer: Self-pay

## 2023-10-14 ENCOUNTER — Other Ambulatory Visit (HOSPITAL_COMMUNITY): Payer: Self-pay

## 2023-10-14 ENCOUNTER — Telehealth: Payer: Self-pay

## 2023-10-14 ENCOUNTER — Encounter (HOSPITAL_COMMUNITY): Payer: Self-pay

## 2023-10-14 DIAGNOSIS — E063 Autoimmune thyroiditis: Secondary | ICD-10-CM

## 2023-10-14 MED ORDER — LIDOCAINE 5 % EX PTCH
1.0000 | MEDICATED_PATCH | CUTANEOUS | 2 refills | Status: DC
  Filled 2023-10-14: qty 30, 30d supply, fill #0
  Filled 2024-01-19: qty 30, 30d supply, fill #1
  Filled 2024-06-23: qty 30, 30d supply, fill #2

## 2023-10-14 MED ORDER — TRAZODONE HCL 50 MG PO TABS
50.0000 mg | ORAL_TABLET | Freq: Every evening | ORAL | 1 refills | Status: DC | PRN
  Filled 2023-10-14: qty 60, 30d supply, fill #0
  Filled 2023-11-12: qty 60, 30d supply, fill #1

## 2023-10-14 MED ORDER — ALBUTEROL SULFATE HFA 108 (90 BASE) MCG/ACT IN AERS
2.0000 | INHALATION_SPRAY | Freq: Four times a day (QID) | RESPIRATORY_TRACT | 2 refills | Status: AC | PRN
  Filled 2023-10-14: qty 6.7, 25d supply, fill #0
  Filled 2024-01-19: qty 6.7, 25d supply, fill #1
  Filled 2024-07-20: qty 6.7, 25d supply, fill #2

## 2023-10-14 NOTE — Telephone Encounter (Signed)
*  [facility]   Pharmacy Patient Advocate Encounter  Received notification from [facility] that Prior Authorization for [name] 75MG  dispersible tablets  has been APPROVED from 10/14/2023 to 10/12/2024. Ran test claim, Copay is $refill too soon 10/17. This test claim was processed through [facility]- copay amounts may vary at other pharmacies due to pharmacy/plan contracts, or as the patient moves through the different stages of their insurance plan.   PA #/Case ID/Reference #: [id number]

## 2023-10-15 ENCOUNTER — Other Ambulatory Visit (HOSPITAL_COMMUNITY): Payer: Self-pay

## 2023-10-15 MED ORDER — DICLOFENAC SODIUM 75 MG PO TBEC
75.0000 mg | DELAYED_RELEASE_TABLET | Freq: Two times a day (BID) | ORAL | 0 refills | Status: DC
  Filled 2023-10-15: qty 60, 30d supply, fill #0

## 2023-10-15 NOTE — Progress Notes (Unsigned)
[name] [address] [place] [address] Phone: [phone] Subjective:   [name], am serving as a scribe for Dr. Antoine Primas.  I'm seeing this patient by the request  of:  Philip Aspen, Limmie Patricia, MD  CC: Right sided hip pain  [id number]  09/12/2023 Increasing discomfort especially over the sacroiliac joint on the right side.  We discussed with patient to continue to be active.  Patient did do traveling and likely is what is exacerbating the discomfort at the moment.  Increase activity slowly.  Follow-up again in 6 to 8 weeks.     Updated 10/16/2023 Barbara Wood is a 53 y.o. female coming in with complaint of OMT on 09/12/2023. She is the same as last visit. Contemplating  injections in GT and glute today. Last injections given in July 2024.  Patient states that it is severe enough that is waking her up at night.  Not as much gluteal pain.       Past Medical History:  Diagnosis Date   Allergy Age 60   Arthritis    shoulders, rt hip   Asthma Since age 72   Depression Age 42   Family history of adverse reaction to anesthesia    mother and sister   GERD (gastroesophageal reflux disease)    Hashimoto's disease    Heart murmur Mitral valve prolapse age 65   Migraine    PONV (postoperative nausea and vomiting)    Pre-diabetes    Psoriasis    Thyroid disease Since age 78   Past Surgical History:  Procedure Laterality Date   BREAST SURGERY  Reduction age 47   CERVICAL DISC ARTHROPLASTY N/A 11/27/2022   Procedure: ARTIFICIAL DISC REPLACEMENT Cervical five-six;  Surgeon: Bethann Goo, DO;  Location: [facility] OR;  Service: Neurosurgery;  Laterality: N/A;   CHOLECYSTECTOMY  Age 6   KNEE ARTHROSCOPY W/ LATERAL RELEASE Right    REDUCTION MAMMAPLASTY     WISDOM TOOTH EXTRACTION     Social History   Socioeconomic History   Marital status: Married    Spouse name: Not on file   Number of children: Not on file   Years of  education: Not on file   Highest education level: Professional school degree (e.g., MD, DDS, DVM, JD)  Occupational History   Not on file  Tobacco Use   Smoking status: Never    Passive exposure: Never   Smokeless tobacco: Never  Vaping Use   Vaping status: Never Used  Substance and Sexual Activity   Alcohol use: Yes    Comment: Rarely   Drug use: Yes    Comment: CBD to sleep   Sexual activity: Not Currently    Partners: Male    Comment: Infertile  Other Topics Concern   Not on file  Social History Narrative   Not on file   Social Determinants of Health   Financial Resource Strain: Low Risk  (05/22/2022)   Overall Financial Resource Strain (CARDIA)    Difficulty of Paying Living Expenses: Not hard at all  Food Insecurity: No Food Insecurity (05/22/2022)   Hunger Vital Sign    Worried About Running Out of Food in the Last Year: Never true    Ran Out of Food in the Last Year: Never true  Transportation Needs: No Transportation Needs (05/22/2022)   PRAPARE - Administrator, Civil Service (Medical): No    Lack of Transportation (Non-Medical): No  Physical Activity: Sufficiently Active (05/22/2022)  Exercise Vital Sign    Days of Exercise per Week: 6 days    Minutes of Exercise per Session: 40 min  Stress: Stress Concern Present (05/22/2022)   [employer] of Occupational Health - Occupational Stress Questionnaire    Feeling of Stress : To some extent  Social Connections: Socially Isolated (05/22/2022)   Social Connection and Isolation Panel [NHANES]    Frequency of Communication with Friends and Family: More than three times a week    Frequency of Social Gatherings with Friends and Family: Twice a week    Attends Religious Services: Never    Database administrator or Organizations: No    Attends Engineer, structural: Not on file    Marital Status: Separated   Allergies  Allergen Reactions   Amoxicillin-Pot Clavulanate Nausea And Vomiting     Augmentin causes nausea and vomiting   Sertraline Other (See Comments)    hallucinations    Sulfamethoxazole-Trimethoprim Hives   Triptans Other (See Comments)    Chest pain    Family History  Problem Relation Age of Onset   Cancer Mother    Depression Mother    Hashimoto's thyroiditis Mother    Diabetes Mother    Bipolar disorder Mother    Hypertension Father    Ankylosing spondylitis Father    Psoriasis Father    Asthma Sister    Miscarriages / Stillbirths Sister    Depression Sister    Psoriasis Sister    Psoriasis Sister    Arthritis Sister        psoriatic arthritis    Psoriasis Sister    Rheum arthritis Sister    Depression Sister    Anxiety disorder Sister    Eczema Sister    Psoriasis Brother    Migraines Brother    ADD / ADHD Brother    Eczema Brother    Allergies Brother    Psoriasis Brother    Psoriasis Maternal Aunt    ADD / ADHD Daughter    Eczema Daughter    Asthma Daughter    Hypermobility Daughter     Current Outpatient Medications (Endocrine & Metabolic):    levothyroxine (SYNTHROID) 100 MCG tablet, Take 1 tablet (100 mcg total) by mouth daily.   liothyronine (CYTOMEL) 5 MCG tablet, Take 1 tablet (5 mcg total) by mouth in the morning, at noon, and at bedtime.   progesterone (PROMETRIUM) 100 MG capsule, Take 1 capsule (100 mg total) by mouth daily.  Current Outpatient Medications (Cardiovascular):    atenolol (TENORMIN) 25 MG tablet, Take 50 mg by mouth daily.  Current Outpatient Medications (Respiratory):    albuterol (PROAIR HFA) 108 (90 Base) MCG/ACT inhaler, Inhale 2 puffs into the lungs every 6 (six) hours as needed for wheezing or shortness of breath.   albuterol (VENTOLIN HFA) 108 (90 Base) MCG/ACT inhaler, Inhale 2 puffs into the lungs every 6 (six) hours as needed for wheezing or shortness of breath   Budesonide (RHINOCORT ALLERGY NA), Place 1 spray into both nostrils 2 (two) times daily.   budesonide-formoterol (SYMBICORT) 80-4.5  MCG/ACT inhaler, Inhale 2 puffs into the lungs 2 (two) times daily.   cetirizine (ZYRTEC) 10 MG tablet, Take 1 tablet by mouth daily.   montelukast (SINGULAIR) 10 MG tablet, Take 1 tablet (10 mg total) by mouth at bedtime.   promethazine (PHENERGAN) 25 MG tablet, Take 1 tablet (25 mg total) by mouth every 6 (six) hours as needed for nausea or vomiting.  Current Outpatient Medications (Analgesics):  diclofenac (VOLTAREN) 75 MG EC tablet, Take 1 tablet (75 mg total) by mouth 2 (two) times daily.   HYDROcodone-acetaminophen (NORCO/VICODIN) 5-325 MG tablet, Take 1 tablet by mouth every 6 (six) hours as needed for pain.   HYDROcodone-acetaminophen (NORCO/VICODIN) 5-325 MG tablet, Take 1 tablet by mouth every 6 (six) hours as needed for pain   Rimegepant Sulfate ([name]) 75 MG TBDP, Take 1 tablet (75 mg total) by mouth daily as needed (migraine). Max dose 1 pill in 24 hours   traMADol (ULTRAM) 50 MG tablet, Take 2 tablets (100 mg total) by mouth 2 (two) times daily.   Current Outpatient Medications (Other):    ARIPiprazole (ABILIFY) 10 MG tablet, Take 1 tablet (10 mg total) by mouth daily.   Baclofen 5 MG TABS, Take 1 tablet (5 mg total) by mouth 2 (two) times daily as needed for spasms   Baclofen 5 MG TABS, Take 1 tablet (5 mg total) by mouth 3 (three) times daily as needed for spasms.   Baclofen 5 MG TABS, Take 1-2 tablets (5-10 mg total) by mouth 3 (three) times daily as needed.   betamethasone dipropionate 0.05 % cream, Apply topically 2 (two) times daily.   betamethasone dipropionate 0.05 % lotion, Apply 1 application topically to the scalp daily. Taper use as able.   Cholecalciferol (VITAMIN D3) 250 MCG (10000 UT) capsule, Take 10,000 Units by mouth daily.   conjugated estrogens (PREMARIN) vaginal cream, Place 0.5 g vaginally 3 (three) times a week.   doxycycline (VIBRA-TABS) 100 MG tablet, Take 1 tablet (100 mg total) by mouth 2 (two) times daily.   lidocaine (LIDODERM) 5 %, Place 1 patch  onto the skin daily. Remove & Discard patch within 12 hours or as directed by MD   [START ON 10/21/2023] lisdexamfetamine (VYVANSE) 50 MG capsule, Take 1 capsule (50 mg total) by mouth daily.   lisdexamfetamine (VYVANSE) 50 MG capsule, Take 1 capsule (50 mg total) by mouth daily.   lisdexamfetamine (VYVANSE) 50 MG capsule, Take 1 capsule (50 mg total) by mouth daily.   meclizine (ANTIVERT) 12.5 MG tablet, Take 1 tablet (12.5 mg total) by mouth 3 (three) times daily as needed for dizziness.   metaxalone (SKELAXIN) 800 MG tablet, Take 1 tablet (800 mg total) by mouth 3 (three) times daily.   NON FORMULARY, Vyepti infusions for migraine   omeprazole (PRILOSEC) 20 MG capsule, Take 1 capsule (20 mg total) by mouth daily.   traZODone (DESYREL) 50 MG tablet, Take 1-2 tablets (50-100 mg total) by mouth at bedtime as needed for sleep.   Reviewed prior external information including notes and imaging from  primary care provider As well as notes that were available from care everywhere and other healthcare systems.  Past medical history, social, surgical and family history all reviewed in electronic medical record.  No pertanent information unless stated regarding to the chief complaint.   Review of Systems:  No , visual changes, nausea, vomiting, diarrhea, constipation, dizziness, abdominal pain, skin rash, fevers, chills, night sweats, weight loss, swollen lymph nodes, body aches, joint swelling, chest pain, shortness of breath, mood changes. POSITIVE muscle aches, headache  Objective  Blood pressure 112/70, height 5\' 7"  (1.702 m), weight 201 lb (91.2 kg), last menstrual period 10/31/2019.   General: No apparent distress alert and oriented x3 mood and affect normal, dressed appropriately.  HEENT: Pupils equal, extraocular movements intact  Respiratory: Patient's speak in full sentences and does not appear short of breath  Cardiovascular: No lower extremity edema, non  tender, no erythema  Tender to  palpation over the right greater trochanteric area.  Positive Faber, negative straight leg test.  Neurovascular intact distally.   After verbal consent patient was prepped with alcohol swab and with a 21-gauge 2 inch needle injected into the right greater trochanteric area with 2 cc of 0.5% Marcaine and 1 cc of Kenalog 40 mg/mL.  No blood loss.  Band-Aid placed.  Postinjection instructions given    Impression and Recommendations:      The above documentation has been reviewed and is accurate and complete Judi Saa, DO

## 2023-10-16 ENCOUNTER — Ambulatory Visit: Payer: Commercial Managed Care - PPO | Admitting: Family Medicine

## 2023-10-16 VITALS — BP 112/70 | Ht 67.0 in | Wt 201.0 lb

## 2023-10-16 DIAGNOSIS — M7061 Trochanteric bursitis, right hip: Secondary | ICD-10-CM

## 2023-10-16 NOTE — Assessment & Plan Note (Signed)
Patient given repeat injection today and tolerated the procedure well, discussed icing regimen and home exercises.  Discussed which activities to do and which ones to avoid.  Discussed with patient if continuing to have difficulty we could consider the possibility of lumbar radiculopathy and will need to consider more advanced imaging again.  Discussed icing regimen and home exercises.  Follow-up with me again in 6 to 8 weeks otherwise.

## 2023-10-16 NOTE — Patient Instructions (Addendum)
Injection in GT today Good to see you! See you again in 6-8 weeks

## 2023-10-17 ENCOUNTER — Other Ambulatory Visit (HOSPITAL_COMMUNITY): Payer: Self-pay

## 2023-10-17 ENCOUNTER — Encounter: Payer: Self-pay | Admitting: Family Medicine

## 2023-10-23 ENCOUNTER — Other Ambulatory Visit (HOSPITAL_COMMUNITY): Payer: Self-pay

## 2023-10-23 ENCOUNTER — Ambulatory Visit: Payer: Commercial Managed Care - PPO | Admitting: Internal Medicine

## 2023-10-23 ENCOUNTER — Encounter: Payer: Self-pay | Admitting: Internal Medicine

## 2023-10-23 VITALS — BP 120/84 | HR 82 | Temp 98.1°F | Wt 201.3 lb

## 2023-10-23 DIAGNOSIS — N951 Menopausal and female climacteric states: Secondary | ICD-10-CM | POA: Diagnosis not present

## 2023-10-23 DIAGNOSIS — M5030 Other cervical disc degeneration, unspecified cervical region: Secondary | ICD-10-CM | POA: Diagnosis not present

## 2023-10-23 DIAGNOSIS — R7302 Impaired glucose tolerance (oral): Secondary | ICD-10-CM | POA: Diagnosis not present

## 2023-10-23 LAB — POCT GLYCOSYLATED HEMOGLOBIN (HGB A1C): Hemoglobin A1C: 5.9 % — AB (ref 4.0–5.6)

## 2023-10-23 MED ORDER — ATENOLOL 25 MG PO TABS
50.0000 mg | ORAL_TABLET | Freq: Every day | ORAL | 1 refills | Status: DC
  Filled 2023-10-23: qty 90, 45d supply, fill #0
  Filled 2023-12-17: qty 90, 45d supply, fill #1

## 2023-10-23 MED ORDER — DICLOFENAC SODIUM 75 MG PO TBEC
75.0000 mg | DELAYED_RELEASE_TABLET | Freq: Two times a day (BID) | ORAL | 0 refills | Status: AC
  Filled 2023-10-23: qty 60, 30d supply, fill #0

## 2023-10-23 MED ORDER — ESTRADIOL 0.5 MG PO TABS
0.5000 mg | ORAL_TABLET | Freq: Every day | ORAL | 1 refills | Status: DC
  Filled 2023-10-23: qty 90, 90d supply, fill #0

## 2023-10-23 NOTE — Progress Notes (Signed)
Established Patient Office Visit     CC/Reason for Visit: Discuss hormone replacement therapy  HPI: Barbara Wood is a 53 y.o. female who is coming in today for the above mentioned reasons.  She is here to discuss postmenopausal vasomotor symptoms and potential HRT.  She has now been postmenopausal since approximately 2019.  She is having significant issues with hot flashes, mood irritability, decreased sleep and vaginal dryness.  Past Medical/Surgical History: Past Medical History:  Diagnosis Date   Allergy Age 24   Arthritis    shoulders, rt hip   Asthma Since age 65   Depression Age 89   Family history of adverse reaction to anesthesia    mother and sister   GERD (gastroesophageal reflux disease)    Hashimoto's disease    Heart murmur Mitral valve prolapse age 9   Migraine    PONV (postoperative nausea and vomiting)    Pre-diabetes    Psoriasis    Thyroid disease Since age 65    Past Surgical History:  Procedure Laterality Date   BREAST SURGERY  Reduction age 89   CERVICAL DISC ARTHROPLASTY N/A 11/27/2022   Procedure: ARTIFICIAL DISC REPLACEMENT Cervical five-six;  Surgeon: Bethann Goo, DO;  Location: [facility];  Service: Neurosurgery;  Laterality: N/A;   CHOLECYSTECTOMY  Age 48   KNEE ARTHROSCOPY W/ LATERAL RELEASE Right    REDUCTION MAMMAPLASTY     WISDOM TOOTH EXTRACTION      Social History:  reports that she has never smoked. She has never been exposed to tobacco smoke. She has never used smokeless tobacco. She reports current alcohol use. She reports current drug use.  Allergies: Allergies  Allergen Reactions   Amoxicillin-Pot Clavulanate Nausea And Vomiting    Augmentin causes nausea and vomiting   Sertraline Other (See Comments)    hallucinations    Sulfamethoxazole-Trimethoprim Hives   Triptans Other (See Comments)    Chest pain     Family History:  Family History  Problem Relation Age of Onset   Cancer Mother    Depression Mother     Hashimoto's thyroiditis Mother    Diabetes Mother    Bipolar disorder Mother    Hypertension Father    Ankylosing spondylitis Father    Psoriasis Father    Asthma Sister    Miscarriages / [place] Sister    Depression Sister    Psoriasis Sister    Psoriasis Sister    Arthritis Sister        psoriatic arthritis    Psoriasis Sister    Rheum arthritis Sister    Depression Sister    Anxiety disorder Sister    Eczema Sister    Psoriasis Brother    Migraines Brother    ADD / ADHD Brother    Eczema Brother    Allergies Brother    Psoriasis Brother    Psoriasis Maternal Aunt    ADD / ADHD Daughter    Eczema Daughter    Asthma Daughter    Hypermobility Daughter      Current Outpatient Medications:    estradiol (ESTRACE) 0.5 MG tablet, Take 1 tablet (0.5 mg total) by mouth daily., Disp: 90 tablet, Rfl: 1   albuterol (PROAIR HFA) 108 (90 Base) MCG/ACT inhaler, Inhale 2 puffs into the lungs every 6 (six) hours as needed for wheezing or shortness of breath., Disp: 6.7 g, Rfl: 2   albuterol (VENTOLIN HFA) 108 (90 Base) MCG/ACT inhaler, Inhale 2 puffs into the lungs every 6 (six)  hours as needed for wheezing or shortness of breath, Disp: 6.7 g, Rfl: 1   ARIPiprazole (ABILIFY) 10 MG tablet, Take 1 tablet (10 mg total) by mouth daily., Disp: 90 tablet, Rfl: 0   atenolol (TENORMIN) 25 MG tablet, Take 2 tablets (50 mg total) by mouth daily., Disp: 90 tablet, Rfl: 1   Baclofen 5 MG TABS, Take 1 tablet (5 mg total) by mouth 2 (two) times daily as needed for spasms, Disp: 60 tablet, Rfl: 3   Baclofen 5 MG TABS, Take 1 tablet (5 mg total) by mouth 3 (three) times daily as needed for spasms., Disp: 90 tablet, Rfl: 3   Baclofen 5 MG TABS, Take 1-2 tablets (5-10 mg total) by mouth 3 (three) times daily as needed., Disp: 180 tablet, Rfl: 3   betamethasone dipropionate 0.05 % cream, Apply topically 2 (two) times daily., Disp: 45 g, Rfl: 5   betamethasone dipropionate 0.05 % lotion, Apply 1  application topically to the scalp daily. Taper use as able., Disp: 120 mL, Rfl: 3   Budesonide (RHINOCORT ALLERGY NA), Place 1 spray into both nostrils 2 (two) times daily., Disp: , Rfl:    budesonide-formoterol (SYMBICORT) 80-4.5 MCG/ACT inhaler, Inhale 2 puffs into the lungs 2 (two) times daily., Disp: 10.2 g, Rfl: 3   cetirizine (ZYRTEC) 10 MG tablet, Take 1 tablet by mouth daily., Disp: , Rfl:    Cholecalciferol (VITAMIN D3) 250 MCG (10000 UT) capsule, Take 10,000 Units by mouth daily., Disp: , Rfl:    conjugated estrogens (PREMARIN) vaginal cream, Place 0.5 g vaginally 3 (three) times a week., Disp: 30 g, Rfl: 12   diclofenac (VOLTAREN) 75 MG EC tablet, Take 1 tablet (75 mg total) by mouth 2 (two) times daily., Disp: 60 tablet, Rfl: 0   doxycycline (VIBRA-TABS) 100 MG tablet, Take 1 tablet (100 mg total) by mouth 2 (two) times daily., Disp: 20 tablet, Rfl: 0   HYDROcodone-acetaminophen (NORCO/VICODIN) 5-325 MG tablet, Take 1 tablet by mouth every 6 (six) hours as needed for pain., Disp: 30 tablet, Rfl: 0   HYDROcodone-acetaminophen (NORCO/VICODIN) 5-325 MG tablet, Take 1 tablet by mouth every 6 (six) hours as needed for pain, Disp: 60 tablet, Rfl: 0   levothyroxine (SYNTHROID) 100 MCG tablet, Take 1 tablet (100 mcg total) by mouth daily., Disp: 90 tablet, Rfl: 1   lidocaine (LIDODERM) 5 %, Place 1 patch onto the skin daily. Remove & Discard patch within 12 hours or as directed by MD, Disp: 30 patch, Rfl: 2   liothyronine (CYTOMEL) 5 MCG tablet, Take 1 tablet (5 mcg total) by mouth in the morning, at noon, and at bedtime., Disp: 270 tablet, Rfl: 3   lisdexamfetamine (VYVANSE) 50 MG capsule, Take 1 capsule (50 mg total) by mouth daily., Disp: 30 capsule, Rfl: 0   lisdexamfetamine (VYVANSE) 50 MG capsule, Take 1 capsule (50 mg total) by mouth daily., Disp: 30 capsule, Rfl: 0   lisdexamfetamine (VYVANSE) 50 MG capsule, Take 1 capsule (50 mg total) by mouth daily., Disp: 30 capsule, Rfl: 0    meclizine (ANTIVERT) 12.5 MG tablet, Take 1 tablet (12.5 mg total) by mouth 3 (three) times daily as needed for dizziness., Disp: 10 tablet, Rfl: 0   metaxalone (SKELAXIN) 800 MG tablet, Take 1 tablet (800 mg total) by mouth 3 (three) times daily., Disp: 90 tablet, Rfl: 1   montelukast (SINGULAIR) 10 MG tablet, Take 1 tablet (10 mg total) by mouth at bedtime., Disp: 90 tablet, Rfl: 3   NON FORMULARY, Vyepti infusions  for migraine, Disp: , Rfl:    omeprazole (PRILOSEC) 20 MG capsule, Take 1 capsule (20 mg total) by mouth daily., Disp: 30 capsule, Rfl: 3   progesterone (PROMETRIUM) 100 MG capsule, Take 1 capsule (100 mg total) by mouth daily., Disp: 90 capsule, Rfl: 4   promethazine (PHENERGAN) 25 MG tablet, Take 1 tablet (25 mg total) by mouth every 6 (six) hours as needed for nausea or vomiting., Disp: 30 tablet, Rfl: 3   Rimegepant Sulfate ([name]) 75 MG TBDP, Take 1 tablet (75 mg total) by mouth daily as needed (migraine). Max dose 1 pill in 24 hours, Disp: 8 tablet, Rfl: 6   traMADol (ULTRAM) 50 MG tablet, Take 2 tablets (100 mg total) by mouth 2 (two) times daily., Disp: 360 tablet, Rfl: 0   traZODone (DESYREL) 50 MG tablet, Take 1-2 tablets (50-100 mg total) by mouth at bedtime as needed for sleep., Disp: 60 tablet, Rfl: 1  Review of Systems:  Negative unless indicated in HPI.   Physical Exam: Vitals:   10/23/23 1337  BP: 120/84  Pulse: 82  Temp: 98.1 F (36.7 C)  TempSrc: Oral  SpO2: 99%  Weight: 201 lb 4.8 oz (91.3 kg)    Body mass index is 31.53 kg/m.   Physical Exam Vitals reviewed.  Constitutional:      Appearance: Normal appearance.  HENT:     Head: Normocephalic and atraumatic.  Eyes:     Conjunctiva/sclera: Conjunctivae normal.  Pulmonary:     Effort: Pulmonary effort is normal.  Skin:    General: Skin is warm and dry.  Neurological:     General: No focal deficit present.     Mental Status: She is alert and oriented to person, place, and time.  Psychiatric:         Mood and Affect: Mood normal.        Behavior: Behavior normal.        Thought Content: Thought content normal.        Judgment: Judgment normal.     Impression and Plan:  IGT (impaired glucose tolerance) -     POCT glycosylated hemoglobin (Hb A1C)  Vasomotor symptoms due to menopause -     Estradiol; Take 1 tablet (0.5 mg total) by mouth daily.  Dispense: 90 tablet; Refill: 1  Degenerative disc disease, cervical -     Atenolol; Take 2 tablets (50 mg total) by mouth daily.  Dispense: 90 tablet; Refill: 1 -     Diclofenac Sodium; Take 1 tablet (75 mg total) by mouth 2 (two) times daily.  Dispense: 60 tablet; Refill: 0  -A1c stable in prediabetic range at 5.9. -In regards to hormone replacement therapy due to her vasomotor symptoms we have decided to start low-dose estradiol 0.5 mg daily.  She is already on progesterone which she will continue 100 mg at nighttime.   Time spent:31 minutes reviewing chart, interviewing and examining patient and formulating plan of care.     Chaya Jan, MD [facility]

## 2023-10-24 ENCOUNTER — Other Ambulatory Visit: Payer: Self-pay

## 2023-10-24 ENCOUNTER — Other Ambulatory Visit (HOSPITAL_COMMUNITY): Payer: Self-pay

## 2023-10-30 ENCOUNTER — Other Ambulatory Visit (HOSPITAL_COMMUNITY): Payer: Self-pay

## 2023-11-12 ENCOUNTER — Other Ambulatory Visit: Payer: Self-pay | Admitting: Internal Medicine

## 2023-11-12 ENCOUNTER — Other Ambulatory Visit (HOSPITAL_COMMUNITY): Payer: Self-pay

## 2023-11-12 DIAGNOSIS — E063 Autoimmune thyroiditis: Secondary | ICD-10-CM

## 2023-11-12 MED ORDER — LEVOTHYROXINE SODIUM 100 MCG PO TABS
100.0000 ug | ORAL_TABLET | Freq: Every day | ORAL | 1 refills | Status: DC
  Filled 2023-11-12: qty 90, 90d supply, fill #0

## 2023-11-13 ENCOUNTER — Other Ambulatory Visit (HOSPITAL_COMMUNITY): Payer: Self-pay

## 2023-11-19 NOTE — Progress Notes (Signed)
[name] [address] [place] [address] Phone: [phone] Subjective:   [name], am serving as a scribe for Dr. Antoine Primas.  I'm seeing this patient by the request  of:  Philip Aspen, Limmie Patricia, MD  CC: hip pain, back pain  [id number]  10/16/2023 Patient given repeat injection today and tolerated the procedure well, discussed icing regimen and home exercises. Discussed which activities to do and which ones to avoid. Discussed with patient if continuing to have difficulty we could consider the possibility of lumbar radiculopathy and will need to consider more advanced imaging again. Discussed icing regimen and home exercises. Follow-up with me again in 6 to 8 weeks otherwise.   Updated 11/20/2023 Barbara Wood is a 53 y.o. female coming in with complaint of hip pain. OMT on September. Patient states that hips have been sore and shoulders have been painful. Has been trying to work on her posture.       Past Medical History:  Diagnosis Date   Allergy Age 55   Arthritis    shoulders, rt hip   Asthma Since age 9   Depression Age 76   Family history of adverse reaction to anesthesia    mother and sister   GERD (gastroesophageal reflux disease)    Hashimoto's disease    Heart murmur Mitral valve prolapse age 86   Migraine    PONV (postoperative nausea and vomiting)    Pre-diabetes    Psoriasis    Thyroid disease Since age 24   Past Surgical History:  Procedure Laterality Date   BREAST SURGERY  Reduction age 41   CERVICAL DISC ARTHROPLASTY N/A 11/27/2022   Procedure: ARTIFICIAL DISC REPLACEMENT Cervical five-six;  Surgeon: Bethann Goo, DO;  Location: [facility] OR;  Service: Neurosurgery;  Laterality: N/A;   CHOLECYSTECTOMY  Age 35   KNEE ARTHROSCOPY W/ LATERAL RELEASE Right    REDUCTION MAMMAPLASTY     WISDOM TOOTH EXTRACTION     Social History   Socioeconomic History   Marital status: Married    Spouse name: Not on  file   Number of children: Not on file   Years of education: Not on file   Highest education level: Professional school degree (e.g., MD, DDS, DVM, JD)  Occupational History   Not on file  Tobacco Use   Smoking status: Never    Passive exposure: Never   Smokeless tobacco: Never  Vaping Use   Vaping status: Never Used  Substance and Sexual Activity   Alcohol use: Yes    Comment: Rarely   Drug use: Yes    Comment: CBD to sleep   Sexual activity: Not Currently    Partners: Male    Comment: Infertile  Other Topics Concern   Not on file  Social History Narrative   Not on file   Social Determinants of Health   Financial Resource Strain: Low Risk  (05/22/2022)   Overall Financial Resource Strain (CARDIA)    Difficulty of Paying Living Expenses: Not hard at all  Food Insecurity: No Food Insecurity (05/22/2022)   Hunger Vital Sign    Worried About Running Out of Food in the Last Year: Never true    Ran Out of Food in the Last Year: Never true  Transportation Needs: No Transportation Needs (05/22/2022)   PRAPARE - Administrator, Civil Service (Medical): No    Lack of Transportation (Non-Medical): No  Physical Activity: Sufficiently Active (05/22/2022)   Exercise Vital  Sign    Days of Exercise per Week: 6 days    Minutes of Exercise per Session: 40 min  Stress: Stress Concern Present (05/22/2022)   [employer] of Occupational Health - Occupational Stress Questionnaire    Feeling of Stress : To some extent  Social Connections: Socially Isolated (05/22/2022)   Social Connection and Isolation Panel [NHANES]    Frequency of Communication with Friends and Family: More than three times a week    Frequency of Social Gatherings with Friends and Family: Twice a week    Attends Religious Services: Never    Database administrator or Organizations: No    Attends Engineer, structural: Not on file    Marital Status: Separated   Allergies  Allergen Reactions    Amoxicillin-Pot Clavulanate Nausea And Vomiting    Augmentin causes nausea and vomiting   Sertraline Other (See Comments)    hallucinations    Sulfamethoxazole-Trimethoprim Hives   Triptans Other (See Comments)    Chest pain    Family History  Problem Relation Age of Onset   Cancer Mother    Depression Mother    Hashimoto's thyroiditis Mother    Diabetes Mother    Bipolar disorder Mother    Hypertension Father    Ankylosing spondylitis Father    Psoriasis Father    Asthma Sister    Miscarriages / Stillbirths Sister    Depression Sister    Psoriasis Sister    Psoriasis Sister    Arthritis Sister        psoriatic arthritis    Psoriasis Sister    Rheum arthritis Sister    Depression Sister    Anxiety disorder Sister    Eczema Sister    Psoriasis Brother    Migraines Brother    ADD / ADHD Brother    Eczema Brother    Allergies Brother    Psoriasis Brother    Psoriasis Maternal Aunt    ADD / ADHD Daughter    Eczema Daughter    Asthma Daughter    Hypermobility Daughter     Current Outpatient Medications (Endocrine & Metabolic):    estradiol (ESTRACE) 0.5 MG tablet, Take 1 tablet (0.5 mg total) by mouth daily.   levothyroxine (SYNTHROID) 100 MCG tablet, Take 1 tablet (100 mcg total) by mouth daily.   liothyronine (CYTOMEL) 5 MCG tablet, Take 1 tablet (5 mcg total) by mouth in the morning, at noon, and at bedtime.   progesterone (PROMETRIUM) 100 MG capsule, Take 1 capsule (100 mg total) by mouth daily.  Current Outpatient Medications (Cardiovascular):    atenolol (TENORMIN) 25 MG tablet, Take 2 tablets (50 mg total) by mouth daily.  Current Outpatient Medications (Respiratory):    albuterol (PROAIR HFA) 108 (90 Base) MCG/ACT inhaler, Inhale 2 puffs into the lungs every 6 (six) hours as needed for wheezing or shortness of breath.   albuterol (VENTOLIN HFA) 108 (90 Base) MCG/ACT inhaler, Inhale 2 puffs into the lungs every 6 (six) hours as needed for wheezing or shortness  of breath   Budesonide (RHINOCORT ALLERGY NA), Place 1 spray into both nostrils 2 (two) times daily.   budesonide-formoterol (SYMBICORT) 80-4.5 MCG/ACT inhaler, Inhale 2 puffs into the lungs 2 (two) times daily.   cetirizine (ZYRTEC) 10 MG tablet, Take 1 tablet by mouth daily.   montelukast (SINGULAIR) 10 MG tablet, Take 1 tablet (10 mg total) by mouth at bedtime.   promethazine (PHENERGAN) 25 MG tablet, Take 1 tablet (25 mg total) by mouth  every 6 (six) hours as needed for nausea or vomiting.  Current Outpatient Medications (Analgesics):    diclofenac (VOLTAREN) 75 MG EC tablet, Take 1 tablet (75 mg total) by mouth 2 (two) times daily.   HYDROcodone-acetaminophen (NORCO/VICODIN) 5-325 MG tablet, Take 1 tablet by mouth every 6 (six) hours as needed for pain.   HYDROcodone-acetaminophen (NORCO/VICODIN) 5-325 MG tablet, Take 1 tablet by mouth every 6 (six) hours as needed for pain   Rimegepant Sulfate ([name]) 75 MG TBDP, Take 1 tablet (75 mg total) by mouth daily as needed (migraine). Max dose 1 pill in 24 hours   traMADol (ULTRAM) 50 MG tablet, Take 2 tablets (100 mg total) by mouth 2 (two) times daily.   Current Outpatient Medications (Other):    ARIPiprazole (ABILIFY) 10 MG tablet, Take 1 tablet (10 mg total) by mouth daily.   Baclofen 5 MG TABS, Take 1 tablet (5 mg total) by mouth 2 (two) times daily as needed for spasms   Baclofen 5 MG TABS, Take 1 tablet (5 mg total) by mouth 3 (three) times daily as needed for spasms.   Baclofen 5 MG TABS, Take 1-2 tablets (5-10 mg total) by mouth 3 (three) times daily as needed.   betamethasone dipropionate 0.05 % cream, Apply topically 2 (two) times daily.   betamethasone dipropionate 0.05 % lotion, Apply 1 application topically to the scalp daily. Taper use as able.   Cholecalciferol (VITAMIN D3) 250 MCG (10000 UT) capsule, Take 10,000 Units by mouth daily.   conjugated estrogens (PREMARIN) vaginal cream, Place 0.5 g vaginally 3 (three) times a  week.   doxycycline (VIBRA-TABS) 100 MG tablet, Take 1 tablet (100 mg total) by mouth 2 (two) times daily.   lidocaine (LIDODERM) 5 %, Place 1 patch onto the skin daily. Remove & Discard patch within 12 hours or as directed by MD   lisdexamfetamine (VYVANSE) 50 MG capsule, Take 1 capsule (50 mg total) by mouth daily.   lisdexamfetamine (VYVANSE) 50 MG capsule, Take 1 capsule (50 mg total) by mouth daily.   lisdexamfetamine (VYVANSE) 50 MG capsule, Take 1 capsule (50 mg total) by mouth daily.   meclizine (ANTIVERT) 12.5 MG tablet, Take 1 tablet (12.5 mg total) by mouth 3 (three) times daily as needed for dizziness.   metaxalone (SKELAXIN) 800 MG tablet, Take 1 tablet (800 mg total) by mouth 3 (three) times daily.   NON FORMULARY, Vyepti infusions for migraine   omeprazole (PRILOSEC) 20 MG capsule, Take 1 capsule (20 mg total) by mouth daily.   traZODone (DESYREL) 50 MG tablet, Take 1-2 tablets (50-100 mg total) by mouth at bedtime as needed for sleep.   Reviewed prior external information including notes and imaging from  primary care provider As well as notes that were available from care everywhere and other healthcare systems.  Past medical history, social, surgical and family history all reviewed in electronic medical record.  No pertanent information unless stated regarding to the chief complaint.   Review of Systems:  No headache, visual changes, nausea, vomiting, diarrhea, constipation, dizziness, abdominal pain, skin rash, fevers, chills, night sweats, weight loss, swollen lymph nodes, body aches, joint swelling, chest pain, shortness of breath, mood changes. POSITIVE muscle aches  Objective  Blood pressure 118/84, pulse 100, height 5\' 7"  (1.702 m), last menstrual period 10/31/2019, SpO2 98%.   General: No apparent distress alert and oriented x3 mood and affect normal, dressed appropriately.  HEENT: Pupils equal, extraocular movements intact  Respiratory: Patient's speak in full  sentences  and does not appear short of breath  Cardiovascular: No lower extremity edema, non tender, no erythema  Patient does have significant tightness noted.  Postsurgical changes of the neck noted.  Patient does have what is appears to be just tightness of the myofascial surrounding the area.  No specific findings.   Osteopathic findings Cervical C2 flexed rotated and side bent right C4 flexed rotated and side bent left C6 flexed rotated and side bent left T3 extended rotated and side bent right inhaled third rib T9 extended rotated and side bent left L2 flexed rotated and side bent right Sacrum right on right    Impression and Recommendations:    The above documentation has been reviewed and is accurate and complete Judi Saa, DO   Myofascial pain Increasing discomfort noted, discussed which activities to do and which ones to avoids.  Increase activity noted.  Discussed with patient about Toradol and Depo-Medrol given today to secondary to the acute worsening of the discomfort.  Discussed avoiding certain activities.  Discussed icing regimen.  Follow-up again in 6 to 8 weeks.   Degenerative disc disease, cervical Postsurgical changes noted.  Discussed icing regimen and home exercises Discussed which activities to do and which ones to avoid.      Decision today to treat with OMT was based on Physical Exam  After verbal consent patient was treated with HVLA, ME, FPR techniques in cervical, thoracic, rib, lumbar and sacral areas, all areas are chronic   Patient tolerated the procedure well with improvement in symptoms  Patient given exercises, stretches and lifestyle modifications  See medications in patient instructions if given  Patient will follow up in 4-8 weeks

## 2023-11-20 ENCOUNTER — Encounter: Payer: Self-pay | Admitting: Family Medicine

## 2023-11-20 ENCOUNTER — Telehealth: Payer: Self-pay | Admitting: Internal Medicine

## 2023-11-20 ENCOUNTER — Other Ambulatory Visit (HOSPITAL_COMMUNITY): Payer: Self-pay

## 2023-11-20 ENCOUNTER — Ambulatory Visit: Payer: Commercial Managed Care - PPO | Admitting: Family Medicine

## 2023-11-20 VITALS — BP 118/84 | HR 100 | Ht 67.0 in

## 2023-11-20 DIAGNOSIS — J4540 Moderate persistent asthma, uncomplicated: Secondary | ICD-10-CM

## 2023-11-20 DIAGNOSIS — M7918 Myalgia, other site: Secondary | ICD-10-CM

## 2023-11-20 DIAGNOSIS — M9908 Segmental and somatic dysfunction of rib cage: Secondary | ICD-10-CM

## 2023-11-20 DIAGNOSIS — M9901 Segmental and somatic dysfunction of cervical region: Secondary | ICD-10-CM

## 2023-11-20 DIAGNOSIS — M25551 Pain in right hip: Secondary | ICD-10-CM | POA: Diagnosis not present

## 2023-11-20 DIAGNOSIS — M5030 Other cervical disc degeneration, unspecified cervical region: Secondary | ICD-10-CM | POA: Diagnosis not present

## 2023-11-20 DIAGNOSIS — M25552 Pain in left hip: Secondary | ICD-10-CM

## 2023-11-20 DIAGNOSIS — M9904 Segmental and somatic dysfunction of sacral region: Secondary | ICD-10-CM | POA: Diagnosis not present

## 2023-11-20 DIAGNOSIS — M9903 Segmental and somatic dysfunction of lumbar region: Secondary | ICD-10-CM

## 2023-11-20 DIAGNOSIS — M9902 Segmental and somatic dysfunction of thoracic region: Secondary | ICD-10-CM | POA: Diagnosis not present

## 2023-11-20 MED ORDER — METHYLPREDNISOLONE ACETATE 80 MG/ML IJ SUSP
80.0000 mg | Freq: Once | INTRAMUSCULAR | Status: AC
  Administered 2023-11-20: 80 mg via INTRAMUSCULAR

## 2023-11-20 MED ORDER — BUDESONIDE-FORMOTEROL FUMARATE 80-4.5 MCG/ACT IN AERO
2.0000 | INHALATION_SPRAY | Freq: Two times a day (BID) | RESPIRATORY_TRACT | 3 refills | Status: DC
  Filled 2023-11-20: qty 10.2, 30d supply, fill #0

## 2023-11-20 MED ORDER — KETOROLAC TROMETHAMINE 60 MG/2ML IM SOLN
60.0000 mg | Freq: Once | INTRAMUSCULAR | Status: AC
  Administered 2023-11-20: 60 mg via INTRAMUSCULAR

## 2023-11-20 NOTE — Patient Instructions (Addendum)
Cocktail injection Hope everything calms down Back in balance See you again in 6-8 weeks

## 2023-11-20 NOTE — Assessment & Plan Note (Signed)
Increasing discomfort noted, discussed which activities to do and which ones to avoids.  Increase activity noted.  Discussed with patient about Toradol and Depo-Medrol given today to secondary to the acute worsening of the discomfort.  Discussed avoiding certain activities.  Discussed icing regimen.  Follow-up again in 6 to 8 weeks.

## 2023-11-20 NOTE — Assessment & Plan Note (Signed)
Postsurgical changes noted.  Discussed icing regimen and home exercises Discussed which activities to do and which ones to avoid.

## 2023-11-20 NOTE — Telephone Encounter (Signed)
Prescription Request  11/20/2023  LOV: 10/23/2023  What is the name of the medication or equipment? Symbicort 160  4.5  Have you contacted your pharmacy to request a refill? No   Which pharmacy would you like this sent to?  [place] Phone: [phone]  Fax: [phone]    She stated she do to puff two times a day. Patient notified that their request is being sent to the clinical staff for review and that they should receive a response within 2 business days.   Please advise at Mobile [phone] (mobile)

## 2023-11-20 NOTE — Telephone Encounter (Signed)
Refill sent.

## 2023-11-21 ENCOUNTER — Other Ambulatory Visit (HOSPITAL_COMMUNITY): Payer: Self-pay

## 2023-11-21 ENCOUNTER — Telehealth: Payer: Self-pay | Admitting: Internal Medicine

## 2023-11-21 ENCOUNTER — Other Ambulatory Visit: Payer: Self-pay | Admitting: Internal Medicine

## 2023-11-21 DIAGNOSIS — J4540 Moderate persistent asthma, uncomplicated: Secondary | ICD-10-CM

## 2023-11-21 MED ORDER — BUDESONIDE-FORMOTEROL FUMARATE 160-4.5 MCG/ACT IN AERO
2.0000 | INHALATION_SPRAY | Freq: Two times a day (BID) | RESPIRATORY_TRACT | 3 refills | Status: DC
  Filled 2023-11-21: qty 10.2, 30d supply, fill #0
  Filled 2023-12-17: qty 10.2, 30d supply, fill #1
  Filled 2024-01-19: qty 10.2, 30d supply, fill #2
  Filled 2024-02-20: qty 10.2, 30d supply, fill #3

## 2023-11-21 NOTE — Telephone Encounter (Signed)
Pt is calling and need budesonide-formoterol (SYMBICORT) 160-4.5 MCG/ACT inhaler  [place] [facility] Phone: [phone]  Fax: [phone]

## 2023-11-25 ENCOUNTER — Telehealth: Payer: Self-pay | Admitting: Internal Medicine

## 2023-11-25 ENCOUNTER — Encounter: Payer: Self-pay | Admitting: Internal Medicine

## 2023-11-25 ENCOUNTER — Other Ambulatory Visit: Payer: Self-pay | Admitting: Internal Medicine

## 2023-11-25 ENCOUNTER — Other Ambulatory Visit (HOSPITAL_COMMUNITY): Payer: Self-pay

## 2023-11-25 DIAGNOSIS — F909 Attention-deficit hyperactivity disorder, unspecified type: Secondary | ICD-10-CM | POA: Insufficient documentation

## 2023-11-25 DIAGNOSIS — F900 Attention-deficit hyperactivity disorder, predominantly inattentive type: Secondary | ICD-10-CM

## 2023-11-25 DIAGNOSIS — F988 Other specified behavioral and emotional disorders with onset usually occurring in childhood and adolescence: Secondary | ICD-10-CM

## 2023-11-25 MED ORDER — LISDEXAMFETAMINE DIMESYLATE 50 MG PO CAPS
50.0000 mg | ORAL_CAPSULE | Freq: Every day | ORAL | 0 refills | Status: DC
  Filled 2023-11-25: qty 30, 30d supply, fill #0

## 2023-11-25 MED ORDER — LISDEXAMFETAMINE DIMESYLATE 50 MG PO CAPS
50.0000 mg | ORAL_CAPSULE | Freq: Every day | ORAL | 0 refills | Status: DC
  Filled 2023-11-25 – 2023-11-27 (×2): qty 30, 30d supply, fill #0

## 2023-11-25 MED ORDER — LISDEXAMFETAMINE DIMESYLATE 50 MG PO CAPS
50.0000 mg | ORAL_CAPSULE | Freq: Every day | ORAL | 0 refills | Status: DC
  Filled 2023-11-25 – 2023-12-26 (×2): qty 30, 30d supply, fill #0

## 2023-11-26 ENCOUNTER — Other Ambulatory Visit (HOSPITAL_COMMUNITY): Payer: Self-pay

## 2023-11-26 NOTE — Telephone Encounter (Signed)
Spoke with pharmacist and refill will be ready 11/27/23.  Patient is aware.

## 2023-11-26 NOTE — Telephone Encounter (Signed)
Pt called to make sure MD sent in her refill for the lisdexamfetamine (VYVANSE) 50 MG capsule.  Pt was worried, because of the holiday, and did not want to be without.   [facility] (Ph: [phone])

## 2023-11-27 ENCOUNTER — Ambulatory Visit: Payer: Commercial Managed Care - PPO | Admitting: Psychiatry

## 2023-11-27 ENCOUNTER — Other Ambulatory Visit (HOSPITAL_COMMUNITY): Payer: Self-pay

## 2023-11-27 DIAGNOSIS — F4310 Post-traumatic stress disorder, unspecified: Secondary | ICD-10-CM | POA: Diagnosis not present

## 2023-11-27 NOTE — Progress Notes (Signed)
[facility] Counselor/Therapist Progress Note  Patient ID: Barbara Wood, MRN: [id number],    Date: 11/27/2023  Time Spent: 51 minutes start time 1:01 PM end time 1:52 PM  Treatment Type: Individual Therapy  Reported Symptoms: anxiety, triggered responses, sadness  Mental Status Exam:  Appearance:   Well Groomed     Behavior:  Appropriate  Motor:  Normal  Speech/Language:   Normal Rate  Affect:  Appropriate  Mood:  normal  Thought process:  normal  Thought content:    WNL  Sensory/Perceptual disturbances:    WNL  Orientation:  oriented to person, place, time/date, and situation  Attention:  Good  Concentration:  Good  Memory:  WNL  Fund of knowledge:   Good  Insight:    Good  Judgment:   Good  Impulse Control:  Good   Risk Assessment: Danger to Self:  No Self-injurious Behavior: No Danger to Others: No Duty to Warn:no Physical Aggression / Violence:No  Access to Firearms a concern: No  Gang Involvement:No   Subjective: Patient was present for session. She shared that things are still hard with her daughter. She explained she is  realizing that it is hard for her to stick up for herself if she has something to gain.  Had patient discussed what she was thinking and why she felt that was happening.  She was able to share a situation with her best friend that is very concerning for her.  Had had patient think through how she really feels and what was being triggered with herself.  Through that she was able to think about what she really needs her friend to know in a different way to communicate it with her.  Patient decided that she would go home and start compiling a text message to send her that tells her more of how she feels rather than just expresses anger or hurt.  Patient did report that things seem to be moving in a better direction with her ex-husband and that she would be going to Thanksgiving with he and her daughter.  She shared that her daughter has seem to be  happy with them being able to get along and doing some things together the 3 of them.  Encouraged patient to continue working on that relationship and recognized that if they can get to the point of Parenting well together that it would be beneficial for all of them but especially their daughter.  Interventions: Solution-Oriented/Positive Psychology and Insight-Oriented  Diagnosis:   ICD-10-CM   1. PTSD (post-traumatic stress disorder)  F43.10       Plan:  Patient is to use coping skills to decrease triggered responses.  Patient is to communicate with her friend her needs and emotions.  Patient is to continue trying to focus on the positive things that are happening in her life.  Patient is to use self spotting as needed to decrease anxiety and rumination.  Patient is to work on diaphragmatic breathing safe place exercise and stretching/yoga throughout the day.  Patient is to focus on what she can control fix and change.  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  Patient is to allow processing to continue between sessions .  Patient is to continue working on limit setting with daughter and ex-husband.  Patient is to release negative emotions through exercise    [name], [facility]

## 2023-12-04 DIAGNOSIS — G43E19 Chronic migraine with aura, intractable, without status migrainosus: Secondary | ICD-10-CM | POA: Diagnosis not present

## 2023-12-10 ENCOUNTER — Other Ambulatory Visit (HOSPITAL_COMMUNITY): Payer: Self-pay

## 2023-12-10 ENCOUNTER — Other Ambulatory Visit: Payer: Self-pay | Admitting: Internal Medicine

## 2023-12-10 ENCOUNTER — Encounter (HOSPITAL_COMMUNITY): Payer: Self-pay

## 2023-12-10 DIAGNOSIS — G4700 Insomnia, unspecified: Secondary | ICD-10-CM

## 2023-12-10 DIAGNOSIS — E063 Autoimmune thyroiditis: Secondary | ICD-10-CM

## 2023-12-10 MED ORDER — LIOTHYRONINE SODIUM 5 MCG PO TABS
5.0000 ug | ORAL_TABLET | Freq: Three times a day (TID) | ORAL | 3 refills | Status: DC
  Filled 2023-12-10: qty 270, 90d supply, fill #0

## 2023-12-10 MED ORDER — TRAZODONE HCL 50 MG PO TABS
50.0000 mg | ORAL_TABLET | Freq: Every evening | ORAL | 1 refills | Status: DC | PRN
Start: 2023-12-10 — End: 2024-01-08
  Filled 2023-12-10: qty 60, 30d supply, fill #0

## 2023-12-11 ENCOUNTER — Other Ambulatory Visit: Payer: Self-pay

## 2023-12-11 ENCOUNTER — Ambulatory Visit: Payer: Commercial Managed Care - PPO | Admitting: Psychiatry

## 2023-12-12 ENCOUNTER — Other Ambulatory Visit (HOSPITAL_COMMUNITY): Payer: Self-pay

## 2023-12-16 ENCOUNTER — Ambulatory Visit: Payer: Commercial Managed Care - PPO | Admitting: Psychiatry

## 2023-12-16 DIAGNOSIS — F4310 Post-traumatic stress disorder, unspecified: Secondary | ICD-10-CM | POA: Diagnosis not present
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
## 2023-12-17 ENCOUNTER — Other Ambulatory Visit (HOSPITAL_COMMUNITY): Payer: Self-pay

## 2023-12-17 ENCOUNTER — Other Ambulatory Visit: Payer: Self-pay | Admitting: Internal Medicine

## 2023-12-17 ENCOUNTER — Other Ambulatory Visit: Payer: Self-pay

## 2023-12-17 DIAGNOSIS — M7918 Myalgia, other site: Secondary | ICD-10-CM

## 2023-12-17 DIAGNOSIS — G43909 Migraine, unspecified, not intractable, without status migrainosus: Secondary | ICD-10-CM

## 2023-12-17 MED ORDER — TRAMADOL HCL 50 MG PO TABS
100.0000 mg | ORAL_TABLET | Freq: Two times a day (BID) | ORAL | 0 refills | Status: DC
  Filled 2023-12-17: qty 360, 90d supply, fill #0

## 2023-12-18 ENCOUNTER — Other Ambulatory Visit (HOSPITAL_COMMUNITY): Payer: Self-pay

## 2023-12-18 ENCOUNTER — Other Ambulatory Visit: Payer: Self-pay

## 2023-12-26 ENCOUNTER — Other Ambulatory Visit (HOSPITAL_COMMUNITY): Payer: Self-pay

## 2023-12-27 ENCOUNTER — Other Ambulatory Visit (HOSPITAL_COMMUNITY): Payer: Self-pay

## 2024-01-07 ENCOUNTER — Other Ambulatory Visit (HOSPITAL_COMMUNITY): Payer: Self-pay

## 2024-01-08 ENCOUNTER — Other Ambulatory Visit (HOSPITAL_COMMUNITY): Payer: Self-pay

## 2024-01-08 ENCOUNTER — Ambulatory Visit (INDEPENDENT_AMBULATORY_CARE_PROVIDER_SITE_OTHER): Payer: Commercial Managed Care - PPO | Admitting: Internal Medicine

## 2024-01-08 ENCOUNTER — Encounter (HOSPITAL_COMMUNITY): Payer: Self-pay

## 2024-01-08 ENCOUNTER — Encounter: Payer: Self-pay | Admitting: Internal Medicine

## 2024-01-08 ENCOUNTER — Ambulatory Visit: Payer: Commercial Managed Care - PPO | Admitting: Psychiatry

## 2024-01-08 VITALS — BP 120/80 | HR 100 | Temp 97.5°F | Ht 67.0 in | Wt 205.0 lb

## 2024-01-08 DIAGNOSIS — G4700 Insomnia, unspecified: Secondary | ICD-10-CM | POA: Diagnosis not present

## 2024-01-08 DIAGNOSIS — R7302 Impaired glucose tolerance (oral): Secondary | ICD-10-CM | POA: Diagnosis not present

## 2024-01-08 DIAGNOSIS — Z0000 Encounter for general adult medical examination without abnormal findings: Secondary | ICD-10-CM | POA: Diagnosis not present

## 2024-01-08 DIAGNOSIS — Z114 Encounter for screening for human immunodeficiency virus [HIV]: Secondary | ICD-10-CM | POA: Diagnosis not present

## 2024-01-08 DIAGNOSIS — Z1159 Encounter for screening for other viral diseases: Secondary | ICD-10-CM

## 2024-01-08 DIAGNOSIS — E66811 Obesity, class 1: Secondary | ICD-10-CM

## 2024-01-08 DIAGNOSIS — F4310 Post-traumatic stress disorder, unspecified: Secondary | ICD-10-CM | POA: Diagnosis not present

## 2024-01-08 DIAGNOSIS — F900 Attention-deficit hyperactivity disorder, predominantly inattentive type: Secondary | ICD-10-CM

## 2024-01-08 DIAGNOSIS — N951 Menopausal and female climacteric states: Secondary | ICD-10-CM

## 2024-01-08 DIAGNOSIS — Z6832 Body mass index (BMI) 32.0-32.9, adult: Secondary | ICD-10-CM

## 2024-01-08 DIAGNOSIS — R011 Cardiac murmur, unspecified: Secondary | ICD-10-CM

## 2024-01-08 DIAGNOSIS — E6609 Other obesity due to excess calories: Secondary | ICD-10-CM | POA: Diagnosis not present

## 2024-01-08 LAB — CBC WITH DIFFERENTIAL/PLATELET
Basophils Absolute: 0.1 10*3/uL (ref 0.0–0.1)
Basophils Relative: 0.6 % (ref 0.0–3.0)
Eosinophils Absolute: 0.1 10*3/uL (ref 0.0–0.7)
Eosinophils Relative: 1.2 % (ref 0.0–5.0)
HCT: 39.2 % (ref 36.0–46.0)
Hemoglobin: 12.4 g/dL (ref 12.0–15.0)
Lymphocytes Relative: 22.9 % (ref 12.0–46.0)
Lymphs Abs: 2.4 10*3/uL (ref 0.7–4.0)
MCHC: 31.7 g/dL (ref 30.0–36.0)
MCV: 85.5 fL (ref 78.0–100.0)
Monocytes Absolute: 0.6 10*3/uL (ref 0.1–1.0)
Monocytes Relative: 6.1 % (ref 3.0–12.0)
Neutro Abs: 7.1 10*3/uL (ref 1.4–7.7)
Neutrophils Relative %: 69.2 % (ref 43.0–77.0)
Platelets: 311 10*3/uL (ref 150.0–400.0)
RBC: 4.59 Mil/uL (ref 3.87–5.11)
RDW: 14.1 % (ref 11.5–15.5)
WBC: 10.3 10*3/uL (ref 4.0–10.5)

## 2024-01-08 LAB — LIPID PANEL
Cholesterol: 188 mg/dL (ref 0–200)
HDL: 76.8 mg/dL (ref >39.00)
LDL Cholesterol: 98 mg/dL (ref 0–99)
NonHDL: 111.37
Total CHOL/HDL Ratio: 2
Triglycerides: 69 mg/dL (ref 0.0–149.0)
VLDL: 13.8 mg/dL (ref 0.0–40.0)

## 2024-01-08 LAB — VITAMIN D 25 HYDROXY (VIT D DEFICIENCY, FRACTURES): VITD: 64.79 ng/mL (ref 30.00–100.00)

## 2024-01-08 LAB — COMPREHENSIVE METABOLIC PANEL
ALT: 9 U/L (ref 0–35)
AST: 13 U/L (ref 0–37)
Albumin: 4.2 g/dL (ref 3.5–5.2)
Alkaline Phosphatase: 89 U/L (ref 39–117)
BUN: 11 mg/dL (ref 6–23)
CO2: 29 meq/L (ref 19–32)
Calcium: 9 mg/dL (ref 8.4–10.5)
Chloride: 104 meq/L (ref 96–112)
Creatinine, Ser: 0.79 mg/dL (ref 0.40–1.20)
GFR: 86.83 mL/min (ref >60.00)
Glucose, Bld: 79 mg/dL (ref 70–99)
Potassium: 3.6 meq/L (ref 3.5–5.1)
Sodium: 141 meq/L (ref 135–145)
Total Bilirubin: 0.5 mg/dL (ref 0.2–1.2)
Total Protein: 6.9 g/dL (ref 6.0–8.3)

## 2024-01-08 LAB — TSH: TSH: 0.14 u[IU]/mL — ABNORMAL LOW (ref 0.35–5.50)

## 2024-01-08 LAB — HEMOGLOBIN A1C: Hgb A1c MFr Bld: 6 % (ref 4.6–6.5)

## 2024-01-08 LAB — VITAMIN B12: Vitamin B-12: 292 pg/mL (ref 211–911)

## 2024-01-08 MED ORDER — LISDEXAMFETAMINE DIMESYLATE 50 MG PO CAPS
50.0000 mg | ORAL_CAPSULE | Freq: Every day | ORAL | 0 refills | Status: DC
  Filled 2024-01-08 – 2024-01-24 (×2): qty 30, 30d supply, fill #0

## 2024-01-08 MED ORDER — LISDEXAMFETAMINE DIMESYLATE 50 MG PO CAPS
50.0000 mg | ORAL_CAPSULE | Freq: Every day | ORAL | 0 refills | Status: DC
  Filled 2024-01-08 – 2024-02-21 (×2): qty 30, 30d supply, fill #0

## 2024-01-08 MED ORDER — ESTRADIOL 1 MG PO TABS
1.0000 mg | ORAL_TABLET | Freq: Every day | ORAL | 1 refills | Status: DC
  Filled 2024-01-08: qty 90, 90d supply, fill #0
  Filled 2024-04-04: qty 90, 90d supply, fill #1

## 2024-01-08 MED ORDER — LISDEXAMFETAMINE DIMESYLATE 50 MG PO CAPS
50.0000 mg | ORAL_CAPSULE | Freq: Every day | ORAL | 0 refills | Status: DC
  Filled 2024-01-08 – 2024-03-20 (×4): qty 30, 30d supply, fill #0

## 2024-01-08 MED ORDER — TRAZODONE HCL 50 MG PO TABS
50.0000 mg | ORAL_TABLET | Freq: Every evening | ORAL | 1 refills | Status: DC | PRN
  Filled 2024-01-08: qty 180, 90d supply, fill #0

## 2024-01-08 MED ORDER — TIRZEPATIDE-WEIGHT MANAGEMENT 2.5 MG/0.5ML ~~LOC~~ SOAJ
2.5000 mg | SUBCUTANEOUS | 0 refills | Status: DC
  Filled 2024-01-08 – 2024-01-09 (×2): qty 2, 28d supply, fill #0

## 2024-01-08 NOTE — Progress Notes (Signed)
 Established Patient Office Visit     CC/Reason for Visit: Annual preventive exam, medication refills, discuss acute concerns  HPI: Barbara Wood is a 54 y.o. female who is coming in today for the above mentioned reasons. Past Medical History is significant for: ADHD on stable dose of Vyvanse  for approximately 9 years, she has a very significant history of migraines.  She has myofascial pain and cervical disc disease gets OMT by Dr. Zack Smith she takes tramadol  daily and has a very sporadic prescription for Norco for migraine relief when the [name] and Emgality  do not take care of it.  She also has a history of impaired glucose tolerance.  She also has hypothyroidism.   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  also has asthma.  Has routine eye and dental care.  Immunizations are up-to-date.  Cancer screening is up-to-date.  She has several acute concerns she would like addressed:  1.  She would like refills of Vyvanse  and trazodone . 2.  She would like to try a GLP-1 for weight loss.  She is interested in Zepbound . 3.  She would like a coronary CT for risk stratification. 4.  She remains irritable and sleeping is not ideal.  Wondering about increasing HRT that was started in October.  She is currently on estradiol  0.5 mg daily.   Past Medical/Surgical History: Past Medical History:  Diagnosis Date   Allergy Age 58   Arthritis    shoulders, rt hip   Asthma Since age 20   Depression Age 7   Family history of adverse reaction to anesthesia    mother and sister   GERD (gastroesophageal reflux disease)    Hashimoto's disease    Heart murmur Mitral valve prolapse age 63   Migraine    PONV (postoperative nausea and vomiting)    Pre-diabetes    Psoriasis    Thyroid  disease Since age 33    Past Surgical History:  Procedure Laterality Date   BREAST SURGERY  Reduction age 55   CERVICAL DISC ARTHROPLASTY N/A 11/27/2022   Procedure: ARTIFICIAL DISC REPLACEMENT Cervical five-six;  Surgeon:  Carollee Lani BROCKS, DO;  Location: [facility] OR;  Service: Neurosurgery;  Laterality: N/A;   CHOLECYSTECTOMY  Age 67   KNEE ARTHROSCOPY W/ LATERAL RELEASE Right    REDUCTION MAMMAPLASTY     WISDOM TOOTH EXTRACTION      Social History:  reports that she has never smoked. She has never been exposed to tobacco smoke. She has never used smokeless tobacco. She reports current alcohol use. She reports current drug use.  Allergies: Allergies  Allergen Reactions   Amoxicillin-Pot Clavulanate Nausea And Vomiting    Augmentin causes nausea and vomiting   Sertraline Other (See Comments)    hallucinations    Sulfamethoxazole-Trimethoprim Hives   Triptans Other (See Comments)    Chest pain     Family History:  Family History  Problem Relation Age of Onset   Cancer Mother    Depression Mother    Hashimoto's thyroiditis Mother    Diabetes Mother    Bipolar disorder Mother    Hypertension Father    Ankylosing spondylitis Father    Psoriasis Father    Asthma Sister    Miscarriages / Stillbirths Sister    Depression Sister    Psoriasis Sister    Psoriasis Sister    Arthritis Sister        psoriatic arthritis    Psoriasis Sister    Rheum arthritis Sister    Depression  Sister    Anxiety disorder Sister    Eczema Sister    Psoriasis Brother    Migraines Brother    ADD / ADHD Brother    Eczema Brother    Allergies Brother    Psoriasis Brother    Psoriasis Maternal Aunt    ADD / ADHD Daughter    Eczema Daughter    Asthma Daughter    Hypermobility Daughter      Current Outpatient Medications:    albuterol  (PROAIR  HFA) 108 (90 Base) MCG/ACT inhaler, Inhale 2 puffs into the lungs every 6 (six) hours as needed for wheezing or shortness of breath., Disp: 6.7 g, Rfl: 2   albuterol  (VENTOLIN  HFA) 108 (90 Base) MCG/ACT inhaler, Inhale 2 puffs into the lungs every 6 (six) hours as needed for wheezing or shortness of breath, Disp: 6.7 g, Rfl: 1   ARIPiprazole  (ABILIFY ) 10 MG tablet, Take 1 tablet  (10 mg total) by mouth daily., Disp: 90 tablet, Rfl: 0   atenolol  (TENORMIN ) 25 MG tablet, Take 2 tablets (50 mg total) by mouth daily., Disp: 90 tablet, Rfl: 1   Baclofen  5 MG TABS, Take 1-2 tablets (5-10 mg total) by mouth 3 (three) times daily as needed., Disp: 180 tablet, Rfl: 3   betamethasone  dipropionate 0.05 % cream, Apply topically 2 (two) times daily., Disp: 45 g, Rfl: 5   betamethasone  dipropionate 0.05 % lotion, Apply 1 application topically to the scalp daily. Taper use as able., Disp: 120 mL, Rfl: 3   Budesonide  (RHINOCORT  ALLERGY NA), Place 1 spray into both nostrils 2 (two) times daily., Disp: , Rfl:    budesonide -formoterol  (SYMBICORT ) 160-4.5 MCG/ACT inhaler, Inhale 2 puffs into the lungs 2 (two) times daily., Disp: 10.2 g, Rfl: 3   cetirizine (ZYRTEC) 10 MG tablet, Take 1 tablet by mouth daily., Disp: , Rfl:    Cholecalciferol (VITAMIN D3) 250 MCG (10000 UT) capsule, Take 10,000 Units by mouth daily., Disp: , Rfl:    conjugated estrogens  (PREMARIN ) vaginal cream, Place 0.5 g vaginally 3 (three) times a week., Disp: 30 g, Rfl: 12   doxycycline  (VIBRA -TABS) 100 MG tablet, Take 1 tablet (100 mg total) by mouth 2 (two) times daily., Disp: 20 tablet, Rfl: 0   estradiol  (ESTRACE ) 1 MG tablet, Take 1 tablet (1 mg total) by mouth daily., Disp: 90 tablet, Rfl: 1   HYDROcodone -acetaminophen  (NORCO/VICODIN) 5-325 MG tablet, Take 1 tablet by mouth every 6 (six) hours as needed for pain., Disp: 30 tablet, Rfl: 0   HYDROcodone -acetaminophen  (NORCO/VICODIN) 5-325 MG tablet, Take 1 tablet by mouth every 6 (six) hours as needed for pain, Disp: 60 tablet, Rfl: 0   levothyroxine  (SYNTHROID ) 100 MCG tablet, Take 1 tablet (100 mcg total) by mouth daily., Disp: 90 tablet, Rfl: 1   lidocaine  (LIDODERM ) 5 %, Place 1 patch onto the skin daily. Remove & Discard patch within 12 hours or as directed by MD, Disp: 30 patch, Rfl: 2   liothyronine  (CYTOMEL ) 5 MCG tablet, Take 1 tablet (5 mcg total) by mouth in  the morning, at noon, and at bedtime., Disp: 270 tablet, Rfl: 3   meclizine  (ANTIVERT ) 12.5 MG tablet, Take 1 tablet (12.5 mg total) by mouth 3 (three) times daily as needed for dizziness., Disp: 10 tablet, Rfl: 0   metaxalone  (SKELAXIN ) 800 MG tablet, Take 1 tablet (800 mg total) by mouth 3 (three) times daily., Disp: 90 tablet, Rfl: 1   montelukast  (SINGULAIR ) 10 MG tablet, Take 1 tablet (10 mg total) by mouth at  bedtime., Disp: 90 tablet, Rfl: 3   NON FORMULARY, Vyepti  infusions for migraine, Disp: , Rfl:    omeprazole  (PRILOSEC) 20 MG capsule, Take 1 capsule (20 mg total) by mouth daily., Disp: 30 capsule, Rfl: 3   progesterone  (PROMETRIUM ) 100 MG capsule, Take 1 capsule (100 mg total) by mouth daily., Disp: 90 capsule, Rfl: 4   promethazine  (PHENERGAN ) 25 MG tablet, Take 1 tablet (25 mg total) by mouth every 6 (six) hours as needed for nausea or vomiting., Disp: 30 tablet, Rfl: 3   Rimegepant Sulfate  ([name]) 75 MG TBDP, Take 1 tablet (75 mg total) by mouth daily as needed (migraine). Max dose 1 pill in 24 hours, Disp: 8 tablet, Rfl: 6   tirzepatide  (ZEPBOUND ) 2.5 MG/0.5ML Pen, Inject 2.5 mg into the skin once a week., Disp: 2 mL, Rfl: 0   traMADol  (ULTRAM ) 50 MG tablet, Take 2 tablets (100 mg total) by mouth 2 (two) times daily., Disp: 360 tablet, Rfl: 0   lisdexamfetamine (VYVANSE ) 50 MG capsule, Take 1 capsule (50 mg total) by mouth daily., Disp: 30 capsule, Rfl: 0   lisdexamfetamine (VYVANSE ) 50 MG capsule, Take 1 capsule (50 mg total) by mouth daily., Disp: 30 capsule, Rfl: 0   lisdexamfetamine (VYVANSE ) 50 MG capsule, Take 1 capsule (50 mg total) by mouth daily., Disp: 30 capsule, Rfl: 0   traZODone  (DESYREL ) 50 MG tablet, Take 1-2 tablets (50-100 mg total) by mouth at bedtime as needed for sleep., Disp: 90 tablet, Rfl: 1  Review of Systems:  Negative unless indicated in HPI.   Physical Exam: Vitals:   01/08/24 1331  BP: 120/80  Pulse: 100  Temp: (!) 97.5 F (36.4 C)  TempSrc:  Oral  SpO2: 97%  Weight: 205 lb (93 kg)  Height: 5' 7 (1.702 m)    Body mass index is 32.11 kg/m.   Physical Exam Vitals reviewed.  Constitutional:      General: She is not in acute distress.    Appearance: Normal appearance. She is not ill-appearing, toxic-appearing or diaphoretic.  HENT:     Head: Normocephalic.     Right Ear: Tympanic membrane, ear canal and external ear normal. There is no impacted cerumen.     Left Ear: Tympanic membrane, ear canal and external ear normal. There is no impacted cerumen.     Nose: Nose normal.     Mouth/Throat:     Mouth: Mucous membranes are moist.     Pharynx: Oropharynx is clear. No oropharyngeal exudate or posterior oropharyngeal erythema.  Eyes:     General: No scleral icterus.       Right eye: No discharge.        Left eye: No discharge.     Conjunctiva/sclera: Conjunctivae normal.     Pupils: Pupils are equal, round, and reactive to light.  Neck:     Vascular: No carotid bruit.  Cardiovascular:     Rate and Rhythm: Normal rate and regular rhythm.     Pulses: Normal pulses.     Heart sounds: Murmur heard.  Pulmonary:     Effort: Pulmonary effort is normal. No respiratory distress.     Breath sounds: Normal breath sounds.  Abdominal:     General: Abdomen is flat. Bowel sounds are normal.     Palpations: Abdomen is soft.  Musculoskeletal:        General: Normal range of motion.     Cervical back: Normal range of motion.  Skin:    General: Skin is warm and  dry.  Neurological:     General: No focal deficit present.     Mental Status: She is alert and oriented to person, place, and time. Mental status is at baseline.  Psychiatric:        Mood and Affect: Mood normal.        Behavior: Behavior normal.        Thought Content: Thought content normal.        Judgment: Judgment normal.      Impression and Plan:  Encounter for preventive health examination -     CT CARDIAC SCORING (SELF PAY ONLY); Future -      ECHOCARDIOGRAM COMPLETE; Future  Attention deficit hyperactivity disorder (ADHD), predominantly inattentive type -     Lisdexamfetamine Dimesylate ; Take 1 capsule (50 mg total) by mouth daily.  Dispense: 30 capsule; Refill: 0 -     Lisdexamfetamine Dimesylate ; Take 1 capsule (50 mg total) by mouth daily.  Dispense: 30 capsule; Refill: 0 -     Lisdexamfetamine Dimesylate ; Take 1 capsule (50 mg total) by mouth daily.  Dispense: 30 capsule; Refill: 0  Insomnia, unspecified type -     traZODone  HCl; Take 1-2 tablets (50-100 mg total) by mouth at bedtime as needed for sleep.  Dispense: 90 tablet; Refill: 1  IGT (impaired glucose tolerance) -     Hemoglobin A1c; Future  Murmur  Vasomotor symptoms due to menopause -     Estradiol ; Take 1 tablet (1 mg total) by mouth daily.  Dispense: 90 tablet; Refill: 1  Class 1 obesity due to excess calories with serious comorbidity and body mass index (BMI) of 32.0 to 32.9 in adult -     CBC with Differential/Platelet; Future -     Comprehensive metabolic panel; Future -     Lipid panel; Future -     TSH; Future -     Vitamin B12; Future -     VITAMIN D  25 Hydroxy (Vit-D Deficiency, Fractures); Future -     Tirzepatide -Weight Management; Inject 2.5 mg into the skin once a week.  Dispense: 2 mL; Refill: 0  Encounter for hepatitis C screening test for low risk patient -     Hepatitis C antibody; Future  Encounter for screening for HIV -     HIV Antibody (routine testing w rflx); Future   -Recommend routine eye and dental care. -Healthy lifestyle discussed in detail. -Labs to be updated today. -Prostate cancer screening: n/a Health Maintenance  Topic Date Due   HIV Screening  Never done   Hepatitis C Screening  Never done   COVID-19 Vaccine (8 - 2024-25 season) 10/28/2023   Mammogram  07/09/2025   Colon Cancer Screening  12/12/2025   Pap with HPV screening  06/28/2026   DTaP/Tdap/Td vaccine (2 - Td or Tdap) 12/15/2031   Flu Shot  Completed    Zoster (Shingles) Vaccine  Completed   HPV Vaccine  Aged Out     -All immunizations are up-to-date. -We have agreed to start Zepbound . -Will order echo for diastolic murmur heard on exam today.  She tells me she was diagnosed with mild mitral valve prolapse with regurgitation after her daughter was born 12 years ago. -Increase estradiol  from 0.5 to 1 mg daily. -Coronary CT scan has been requested. -PDMP reviewed, no red flags, overdose risk score is 350.  Refill trazodone  and Vyvanse .  Tully Theophilus Andrews, MD [facility]
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
## 2024-01-09 ENCOUNTER — Other Ambulatory Visit: Payer: Self-pay

## 2024-01-09 ENCOUNTER — Other Ambulatory Visit (HOSPITAL_COMMUNITY): Payer: Self-pay

## 2024-01-09 ENCOUNTER — Telehealth: Payer: Self-pay | Admitting: *Deleted

## 2024-01-09 ENCOUNTER — Other Ambulatory Visit: Payer: Self-pay | Admitting: Family Medicine

## 2024-01-09 LAB — HIV ANTIBODY (ROUTINE TESTING W REFLEX): HIV 1&2 Ab, 4th Generation: NONREACTIVE

## 2024-01-09 LAB — HEPATITIS C ANTIBODY: Hepatitis C Ab: NONREACTIVE

## 2024-01-09 MED ORDER — TRIAMCINOLONE ACETONIDE 0.1 % EX CREA
1.0000 | TOPICAL_CREAM | Freq: Two times a day (BID) | CUTANEOUS | 2 refills | Status: AC
Start: 2024-01-09 — End: ?
  Filled 2024-01-09: qty 454, 90d supply, fill #0
  Filled 2024-06-23: qty 454, 90d supply, fill #1

## 2024-01-09 NOTE — Telephone Encounter (Signed)
 Copied from CRM [id number]. Topic: Clinical - Medication Refill >> Jan 09, 2024  1:01 PM Victoria A wrote: Most Recent Primary Care Visit:  Provider: THEOPHILUS DELMA TULLY [name]  Department: [facility]  Visit Type: PHYSICAL  Date: 01/08/2024  Medication:  lisdexamfetamine (VYVANSE ) 50 MG capsule Triamcinolone  Cream   Patient contacted Pharmacy regarding the two medications and was informed medication was not called in   Has the patient contacted their pharmacy? Yes (Agent: If no, request that the patient contact the pharmacy for the refill. If patient does not wish to contact the pharmacy document the reason why and proceed with request.) (Agent: If yes, when and what did the pharmacy advise?)  Is this the correct pharmacy for this prescription? Yes If no, delete pharmacy and type the correct one.  This is the patient's preferred pharmacy:  [facility] [address] [place] [address] Phone: [phone] Fax: [phone]  [facility] [phone] - [place] [place] - [address] [place] [address] Phone: [phone] Fax: [phone]   Has the prescription been filled recently? No  Is the patient out of the medication? Yes  Has the patient been seen for an appointment in the last year OR does the patient have an upcoming appointment? Yes  Can we respond through MyChart? No  Agent: Please be advised that Rx refills may take up to 3 business days. We ask that you follow-up with your pharmacy.

## 2024-01-09 NOTE — Telephone Encounter (Signed)
 Spoke to the pharmacist and they have the prescriptions for Vyvanse, but can not be filled until 01/23/24. Spoke to the patient and she said that she had requested a Rx for Triamcinolone cream. Rx sent per Dr Ardyth Harps.

## 2024-01-13 ENCOUNTER — Other Ambulatory Visit (HOSPITAL_COMMUNITY): Payer: Self-pay

## 2024-01-13 ENCOUNTER — Encounter: Payer: Self-pay | Admitting: Internal Medicine

## 2024-01-13 ENCOUNTER — Other Ambulatory Visit: Payer: Self-pay | Admitting: Internal Medicine

## 2024-01-13 DIAGNOSIS — E039 Hypothyroidism, unspecified: Secondary | ICD-10-CM

## 2024-01-13 DIAGNOSIS — R7302 Impaired glucose tolerance (oral): Secondary | ICD-10-CM | POA: Insufficient documentation

## 2024-01-13 MED ORDER — ARIPIPRAZOLE 10 MG PO TABS
10.0000 mg | ORAL_TABLET | Freq: Every day | ORAL | 0 refills | Status: DC
  Filled 2024-01-13: qty 90, 90d supply, fill #0

## 2024-01-13 MED ORDER — LEVOTHYROXINE SODIUM 100 MCG PO TABS: 100.0000 ug | ORAL_TABLET | Freq: Every day | ORAL | Status: DC

## 2024-01-14 ENCOUNTER — Other Ambulatory Visit (HOSPITAL_COMMUNITY): Payer: Self-pay

## 2024-01-14 ENCOUNTER — Other Ambulatory Visit: Payer: Self-pay | Admitting: Internal Medicine

## 2024-01-14 DIAGNOSIS — G43909 Migraine, unspecified, not intractable, without status migrainosus: Secondary | ICD-10-CM

## 2024-01-14 MED ORDER — PROMETHAZINE HCL 25 MG PO TABS
25.0000 mg | ORAL_TABLET | Freq: Four times a day (QID) | ORAL | 3 refills | Status: DC | PRN
  Filled 2024-01-14: qty 30, 8d supply, fill #0

## 2024-01-15 NOTE — Progress Notes (Signed)
[name] [address] [place] [address] Phone: [phone] Subjective:   Barbara Wood, am serving as a scribe for Dr. Antoine Primas.  I'm seeing this patient by the request  of:  Philip Aspen, Limmie Patricia, MD  CC: back and neck pain follow up   [id number]  Barbara Wood is a 54 y.o. female coming in with complaint of back and neck pain. OMT 11/20/2023. Also f/u for R hip pain. Patient states that her R shoulder and R glute medius. Feels like she needs injections.   Pain in C7 and scapular area recently. Has been using yoga roller.        Reviewing patient's chart most recent TSH was 0.14, vitamin D was 64, B12 was low at 292    Reviewed prior external information including notes and imaging from previsou exam, outside providers and external EMR if available.   As well as notes that were available from care everywhere and other healthcare systems.  Past medical history, social, surgical and family history all reviewed in electronic medical record.  No pertanent information unless stated regarding to the chief complaint.   Past Medical History:  Diagnosis Date   Allergy Age 49   Arthritis    shoulders, rt hip   Asthma Since age 33   Depression Age 33   Family history of adverse reaction to anesthesia    mother and sister   GERD (gastroesophageal reflux disease)    Hashimoto's disease    Heart murmur Mitral valve prolapse age 43   Migraine    PONV (postoperative nausea and vomiting)    Pre-diabetes    Psoriasis    Thyroid disease Since age 23    Allergies  Allergen Reactions   Amoxicillin-Pot Clavulanate Nausea And Vomiting    Augmentin causes nausea and vomiting   Sertraline Other (See Comments)    hallucinations    Sulfamethoxazole-Trimethoprim Hives   Triptans Other (See Comments)    Chest pain      Review of Systems:  No headache, visual changes, nausea, vomiting, diarrhea, constipation, dizziness, abdominal  pain, skin rash, fevers, chills, night sweats, weight loss, swollen lymph nodes, body aches, joint swelling, chest pain, shortness of breath, mood changes. POSITIVE muscle aches  Objective  Blood pressure 122/84, pulse 74, height 5\' 7"  (1.702 m), last menstrual period 10/31/2019, SpO2 98%.   General: No apparent distress alert and oriented x3 mood and affect normal, dressed appropriately.  HEENT: Pupils equal, extraocular movements intact  Respiratory: Patient's speak in full sentences and does not appear short of breath  Cardiovascular: No lower extremity edema, non tender, no erythema  Gait MSK:  Back significant tightness noted in the right side of the paraspinal musculature.  Tightness with Pearlean Brownie right greater than left.  Osteopathic findings  C6 flexed rotated and side bent left T3 extended rotated and side bent right inhaled rib L2 flexed rotated and side bent right Sacrum right on right  Procedure: Real-time Ultrasound Guided Injection of right acromioclavicular joint Device: GE Logiq Q7 Ultrasound guided injection is preferred based studies that show increased duration, increased effect, greater accuracy, decreased procedural pain, increased response rate, and decreased cost with ultrasound guided versus blind injection.  Verbal informed consent obtained.  Time-out conducted.  Noted no overlying erythema, induration, or other signs of local infection.  Skin prepped in a sterile fashion.  Local anesthesia: Topical Ethyl chloride.  With sterile technique and under real time ultrasound guidance: With a 25-gauge half  inch needle injected with 0.5 cc of 0.5% Marcaine and 0.5 cc of Kenalog 40 mg/mL Completed without difficulty  Pain immediately resolved suggesting accurate placement of the medication.  Images stored in patient's chart Advised to call if fevers/chills, erythema, induration, drainage, or persistent bleeding.  Impression: Technically successful ultrasound guided  injection.  Procedure: Real-time Ultrasound Guided Injection of right gluteal tendon sheath Device: GE Logiq Q7 Ultrasound guided injection is preferred based studies that show increased duration, increased effect, greater accuracy, decreased procedural pain, increased response rate, and decreased cost with ultrasound guided versus blind injection.  Verbal informed consent obtained.  Time-out conducted.  Noted no overlying erythema, induration, or other signs of local infection.  Skin prepped in a sterile fashion.  Local anesthesia: Topical Ethyl chloride.  With sterile technique and under real time ultrasound guidance: With a 21-gauge 2 inch needle injected into the gluteal sheath laterally.  Total of 0.5 cc of 0.5% Marcaine and 1 cc of Kenalog 40 mg/mL used. Completed without difficulty  Pain immediately resolved suggesting accurate placement of the medication.  Advised to call if fevers/chills, erythema, induration, drainage, or persistent bleeding.  Images stored in patient's medical record Impression: Technically successful ultrasound guided injection.   Assessment and Plan:  Arthritis of right acromioclavicular joint Chronic problem with exacerbation.  Some of it and I think is still secondary to some weakness that was associated with patient's myelopathy.  Encourage patient to continue to stay active as well.  Follow-up with me again in 6 to 8 weeks otherwise.  Worsening pain advanced imaging would be warranted but would not change medical management at this time.  Degenerative disc disease, cervical Muscle energy techniques done.  Discussed icing regimen and home exercises, which activities to do and which ones to avoid.  Patient will continue the same medication she has been on for some time.  Gluteal tendinitis of right buttock Patient given injection and tolerated the procedure well, discussed icing regimen and home exercises, increase activity slowly otherwise.  Follow-up again  in 6 to 8 weeks    Nonallopathic problems  Decision today to treat with OMT was based on Physical Exam  After verbal consent patient was treated with  ME, FPR techniques in cervical, rib, thoracic, lumbar, and sacral  areas avoided HVLA on the neck  Patient tolerated the procedure well with improvement in symptoms  Patient given exercises, stretches and lifestyle modifications  See medications in patient instructions if given  Patient will follow up in 4-8 weeks    The above documentation has been reviewed and is accurate and complete Judi Saa, DO          Note: This dictation was prepared with Dragon dictation along with smaller phrase technology. Any transcriptional errors that result from this process are unintentional.

## 2024-01-16 ENCOUNTER — Ambulatory Visit: Payer: Commercial Managed Care - PPO | Admitting: Family Medicine

## 2024-01-16 ENCOUNTER — Other Ambulatory Visit: Payer: Self-pay

## 2024-01-16 VITALS — BP 122/84 | HR 74 | Ht 67.0 in

## 2024-01-16 DIAGNOSIS — M9902 Segmental and somatic dysfunction of thoracic region: Secondary | ICD-10-CM | POA: Diagnosis not present

## 2024-01-16 DIAGNOSIS — M7601 Gluteal tendinitis, right hip: Secondary | ICD-10-CM | POA: Diagnosis not present

## 2024-01-16 DIAGNOSIS — M19011 Primary osteoarthritis, right shoulder: Secondary | ICD-10-CM

## 2024-01-16 DIAGNOSIS — M9901 Segmental and somatic dysfunction of cervical region: Secondary | ICD-10-CM

## 2024-01-16 DIAGNOSIS — M9903 Segmental and somatic dysfunction of lumbar region: Secondary | ICD-10-CM

## 2024-01-16 DIAGNOSIS — M9904 Segmental and somatic dysfunction of sacral region: Secondary | ICD-10-CM | POA: Diagnosis not present

## 2024-01-16 DIAGNOSIS — M25511 Pain in right shoulder: Secondary | ICD-10-CM

## 2024-01-16 DIAGNOSIS — M9908 Segmental and somatic dysfunction of rib cage: Secondary | ICD-10-CM

## 2024-01-16 DIAGNOSIS — M5030 Other cervical disc degeneration, unspecified cervical region: Secondary | ICD-10-CM | POA: Diagnosis not present

## 2024-01-17 ENCOUNTER — Encounter: Payer: Self-pay | Admitting: Family Medicine

## 2024-01-17 NOTE — Assessment & Plan Note (Signed)
Patient given injection and tolerated the procedure well, discussed icing regimen and home exercises, increase activity slowly otherwise.  Follow-up again in 6 to 8 weeks

## 2024-01-17 NOTE — Assessment & Plan Note (Signed)
Muscle energy techniques done.  Discussed icing regimen and home exercises, which activities to do and which ones to avoid.  Patient will continue the same medication she has been on for some time.

## 2024-01-17 NOTE — Assessment & Plan Note (Signed)
Chronic problem with exacerbation.  Some of it and I think is still secondary to some weakness that was associated with patient's myelopathy.  Encourage patient to continue to stay active as well.  Follow-up with me again in 6 to 8 weeks otherwise.  Worsening pain advanced imaging would be warranted but would not change medical management at this time.

## 2024-01-19 ENCOUNTER — Other Ambulatory Visit: Payer: Self-pay | Admitting: Internal Medicine

## 2024-01-20 ENCOUNTER — Other Ambulatory Visit (HOSPITAL_COMMUNITY): Payer: Self-pay

## 2024-01-20 ENCOUNTER — Encounter: Payer: Self-pay | Admitting: *Deleted

## 2024-01-20 ENCOUNTER — Other Ambulatory Visit: Payer: Self-pay

## 2024-01-20 MED ORDER — MONTELUKAST SODIUM 10 MG PO TABS
10.0000 mg | ORAL_TABLET | Freq: Every day | ORAL | 1 refills | Status: DC
  Filled 2024-01-24: qty 90, 90d supply, fill #0
  Filled 2024-04-20: qty 90, 90d supply, fill #1

## 2024-01-22 ENCOUNTER — Ambulatory Visit: Payer: Commercial Managed Care - PPO | Admitting: Psychiatry

## 2024-01-22 DIAGNOSIS — F4310 Post-traumatic stress disorder, unspecified: Secondary | ICD-10-CM

## 2024-01-22 NOTE — Progress Notes (Signed)
[facility]or[facility]rapist Progress Note  Patient ID: Barbara Wood, MRN: [id number],    Date: 01/22/2024  Time Spent: 32 minutes start time 1:10 PM end time 1:42 PM  Treatment Type: Individual Therapy  Reported Symptoms: anxiety, triggered responses, sadness  Mental Status Exam:  Appearance:   Well Groomed     Behavior:  Appropriate  Motor:  Normal  Speech/Language:   Normal Rate  Affect:  Appropriate  Mood:  anxious  Thought process:  normal  Thought content:    WNL  Sensory/Perceptual disturbances:    WNL  Orientation:  oriented to person, place, time/date, and situation  Attention:  Good  Concentration:  Good  Memory:  WNL  Fund of knowledge:   Good  Insight:    Good  Judgment:   Good  Impulse Control:  Good   Risk Assessment: Danger to Self:  No Self-injurious Behavior: No Danger to Others: No Duty to Warn:no Physical Aggression / Violence:No  Access to Firearms a concern: No  Gang Involvement:No   Subjective: Patient was present for session. She shared her ex lost his home and has made bad financial choices. She is working to set limits with him and it has been very stressful. Her daughter passed out and she was worried that she had a seizure.  They went to the emergency room but nothing could be found.  She was concerned that there may be a possible interaction with a med change that happened recently.  She is to communicate with her daughter psychiatrist about that possibility.  Patient went on to share she had followed through with plans from last session and sent letter to [facility].  She was able to get the information she needed to be able to let go of the ruminating over the relationship.  Patient was encouraged to feel good about the answer and how she was feeling.  She also shared that her ex-husband was not doing anything about the situation regarding losing his home.  Patient stepped in for a while to try and help him because she knows he is depressed but  she realized she had to set some limits.  Patient was able to contact his family and agreed that they should be the ones taking care of him and that he may need to stay with them at this time.  Encouraged patient to feel good about setting the limits and not trying to caretake him when she has so much on her plate is already.  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  Patient was encouraged to recognize that all she is taking on is creating lots of stress and she needs to find some ways to release things.  She admitted she had not had time to go walking and could feel the negativity in her body.  She is going to [place] with some friends for a week and when she gets back she agreed to [facility] or going for walks if it is warm enough outside.  Interventions: Solution-Oriented/Positive Psychology  Diagnosis:   ICD-10-CM   1. PTSD (post-traumatic stress disorder)  F43.10       Plan: Patient is to use coping skills to decrease triggered responses.  Patient is to get back to exercising regularly.  Patient is to continue trying to focus  on the positive things that are happening in her life.  Patient is to use self spotting as needed to decrease anxiety and rumination.  Patient is to work on diaphragmatic breathing safe place exercise and stretching/yoga throughout the day.  Patient is to focus on what she can control fix and change.  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  Patient is to allow processing to continue between sessions .  Patient is to continue working on limit setting with daughter and ex-husband.  Patient is to release negative emotions through exercise    [name], [facility]

## 2024-01-23 ENCOUNTER — Other Ambulatory Visit (HOSPITAL_COMMUNITY): Payer: Self-pay

## 2024-01-24 ENCOUNTER — Other Ambulatory Visit (HOSPITAL_COMMUNITY): Payer: Self-pay

## 2024-01-27 ENCOUNTER — Telehealth: Payer: Self-pay | Admitting: *Deleted

## 2024-01-27 ENCOUNTER — Other Ambulatory Visit: Payer: Self-pay | Admitting: *Deleted

## 2024-01-27 DIAGNOSIS — R7302 Impaired glucose tolerance (oral): Secondary | ICD-10-CM

## 2024-01-27 NOTE — Telephone Encounter (Signed)
Called patient and reviewed lab results.  Patient states that is almost time for a refill of her Zepbound, but she does not want to increase the dosage because she is still very nauseous with vomiting.   She is taking up to 3 phenergan a day at the beginning of the week.

## 2024-01-28 NOTE — Telephone Encounter (Signed)
Patient is aware.  She states that she is feeling "some better".  Patient is leaving for a trip and will call back when she returns.

## 2024-02-05 ENCOUNTER — Ambulatory Visit (HOSPITAL_COMMUNITY): Payer: Commercial Managed Care - PPO | Attending: Internal Medicine

## 2024-02-05 ENCOUNTER — Ambulatory Visit: Payer: Commercial Managed Care - PPO | Admitting: Psychiatry

## 2024-02-05 ENCOUNTER — Other Ambulatory Visit: Payer: Self-pay | Admitting: Internal Medicine

## 2024-02-05 ENCOUNTER — Other Ambulatory Visit (HOSPITAL_COMMUNITY): Payer: Self-pay

## 2024-02-05 DIAGNOSIS — Z0000 Encounter for general adult medical examination without abnormal findings: Secondary | ICD-10-CM | POA: Insufficient documentation

## 2024-02-05 DIAGNOSIS — E66811 Obesity, class 1: Secondary | ICD-10-CM

## 2024-02-05 DIAGNOSIS — R011 Cardiac murmur, unspecified: Secondary | ICD-10-CM

## 2024-02-05 LAB — ECHOCARDIOGRAM COMPLETE
Area-P 1/2: 3.31 cm2
Calc EF: 56.9 %
S' Lateral: 2.79 cm
Single Plane A2C EF: 56.9 %
Single Plane A4C EF: 56.1 %

## 2024-02-05 MED ORDER — PERFLUTREN LIPID MICROSPHERE
1.0000 mL | INTRAVENOUS | Status: AC | PRN
  Administered 2024-02-05: 3 mL via INTRAVENOUS

## 2024-02-05 MED ORDER — ZEPBOUND 2.5 MG/0.5ML ~~LOC~~ SOAJ
2.5000 mg | SUBCUTANEOUS | 0 refills | Status: DC
  Filled 2024-02-05: qty 2, 28d supply, fill #0

## 2024-02-06 ENCOUNTER — Other Ambulatory Visit (HOSPITAL_COMMUNITY): Payer: Self-pay

## 2024-02-07 ENCOUNTER — Other Ambulatory Visit (HOSPITAL_COMMUNITY): Payer: Self-pay

## 2024-02-07 ENCOUNTER — Other Ambulatory Visit: Payer: Self-pay | Admitting: Internal Medicine

## 2024-02-07 DIAGNOSIS — M5030 Other cervical disc degeneration, unspecified cervical region: Secondary | ICD-10-CM

## 2024-02-07 DIAGNOSIS — E063 Autoimmune thyroiditis: Secondary | ICD-10-CM

## 2024-02-10 ENCOUNTER — Other Ambulatory Visit (HOSPITAL_COMMUNITY): Payer: Self-pay

## 2024-02-10 MED ORDER — LEVOTHYROXINE SODIUM 100 MCG PO TABS
100.0000 ug | ORAL_TABLET | Freq: Every day | ORAL | 1 refills | Status: DC
  Filled 2024-02-10: qty 90, 90d supply, fill #0
  Filled 2024-05-05: qty 90, 90d supply, fill #1

## 2024-02-10 MED ORDER — ATENOLOL 25 MG PO TABS
50.0000 mg | ORAL_TABLET | Freq: Every day | ORAL | 1 refills | Status: DC
  Filled 2024-02-10: qty 90, 45d supply, fill #0

## 2024-02-12 ENCOUNTER — Other Ambulatory Visit: Payer: Self-pay | Admitting: Internal Medicine

## 2024-02-12 ENCOUNTER — Encounter (HOSPITAL_COMMUNITY): Payer: Self-pay

## 2024-02-12 ENCOUNTER — Other Ambulatory Visit (HOSPITAL_COMMUNITY): Payer: Self-pay

## 2024-02-12 DIAGNOSIS — M5030 Other cervical disc degeneration, unspecified cervical region: Secondary | ICD-10-CM

## 2024-02-12 MED ORDER — ATENOLOL 25 MG PO TABS
50.0000 mg | ORAL_TABLET | Freq: Every day | ORAL | 1 refills | Status: DC
Start: 2024-02-12 — End: 2024-09-07
  Filled 2024-02-12 – 2024-03-17 (×2): qty 180, 90d supply, fill #0
  Filled 2024-06-23: qty 180, 90d supply, fill #1

## 2024-02-18 ENCOUNTER — Telehealth: Payer: Self-pay

## 2024-02-18 ENCOUNTER — Telehealth: Payer: Self-pay | Admitting: Psychiatry

## 2024-02-18 ENCOUNTER — Other Ambulatory Visit (HOSPITAL_COMMUNITY): Payer: Self-pay

## 2024-02-18 ENCOUNTER — Encounter: Payer: Self-pay | Admitting: Internal Medicine

## 2024-02-18 ENCOUNTER — Other Ambulatory Visit: Payer: Self-pay | Admitting: Internal Medicine

## 2024-02-18 DIAGNOSIS — B029 Zoster without complications: Secondary | ICD-10-CM

## 2024-02-18 MED ORDER — VALACYCLOVIR HCL 1 G PO TABS
1000.0000 mg | ORAL_TABLET | Freq: Three times a day (TID) | ORAL | 0 refills | Status: AC
  Filled 2024-02-18: qty 21, 7d supply, fill #0

## 2024-02-18 NOTE — Telephone Encounter (Signed)
 Pt called to cancel appointment for tomorrow due to bad weather. Patient ask if reschedule way out would it effect her infusion on 02/26/24.  Spoke with Dr. Lucia Gaskins, she said she would be fine for her infusion and we will call her back to reschedule her appointment.

## 2024-02-18 NOTE — Telephone Encounter (Signed)
 Copied from CRM [id number]. Topic: Clinical - Medical Advice >> Feb 18, 2024  8:15 AM Adaysia C wrote: Reason for CRM: Patient has shingles and is requesting an anti-viral before it starts getting worse and blistering. Please follow up with patient [phone]

## 2024-02-18 NOTE — Telephone Encounter (Signed)
 Message sent via My chart

## 2024-02-18 NOTE — Telephone Encounter (Signed)
 Called pt to cx appt on 2/19- office closing for snow. She wanted to make sure that her Vyepti infusion at the end of February wasn't going to be cancelled because today's appt was cancelled. 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 She will cb to r/s when she gets home, she was driving when I called her.

## 2024-02-19 ENCOUNTER — Other Ambulatory Visit (HOSPITAL_COMMUNITY): Payer: Self-pay

## 2024-02-19 ENCOUNTER — Ambulatory Visit: Payer: Commercial Managed Care - PPO | Admitting: Neurology

## 2024-02-20 ENCOUNTER — Other Ambulatory Visit (HOSPITAL_COMMUNITY): Payer: Self-pay

## 2024-02-21 ENCOUNTER — Other Ambulatory Visit: Payer: Self-pay

## 2024-02-21 ENCOUNTER — Other Ambulatory Visit (HOSPITAL_COMMUNITY): Payer: Self-pay

## 2024-02-25 ENCOUNTER — Encounter: Payer: Self-pay | Admitting: Internal Medicine

## 2024-02-25 NOTE — Telephone Encounter (Signed)
 Pt would like a call back to get a earlier appt than July with Dr. Lucia Gaskins Patient refused rescheduling on March 3 at 830 am

## 2024-02-25 NOTE — Telephone Encounter (Signed)
 Spoke to patient Made a f/u appointment 04/29/2024 with Dr Lucia Gaskins Pt expressed understanding and thanked me for calling

## 2024-02-26 ENCOUNTER — Ambulatory Visit (INDEPENDENT_AMBULATORY_CARE_PROVIDER_SITE_OTHER): Payer: Commercial Managed Care - PPO | Admitting: Psychiatry

## 2024-02-26 DIAGNOSIS — G43E19 Chronic migraine with aura, intractable, without status migrainosus: Secondary | ICD-10-CM | POA: Diagnosis not present

## 2024-02-26 DIAGNOSIS — F4310 Post-traumatic stress disorder, unspecified: Secondary | ICD-10-CM

## 2024-02-26 NOTE — Progress Notes (Unsigned)
      [facility] Counselor/Therapist Progress Note  Patient ID: Barbara Wood, MRN: [id number],    Date: 02/26/2024  Time Spent: 48 minutes start time 1:07 PM end time   Treatment Type: Individual Therapy  Reported Symptoms: triggered responses, anxiety, sadness  Mental Status Exam:  Appearance:   Well Groomed     Behavior:  Appropriate  Motor:  Normal  Speech/Language:   Normal Rate  Affect:  Appropriate  Mood:  anxious  Thought process:  normal  Thought content:    WNL  Sensory/Perceptual disturbances:    WNL  Orientation:  oriented to person, place, time/date, and situation  Attention:  Good  Concentration:  Good  Memory:  WNL  Fund of knowledge:   Good  Insight:    Good  Judgment:   Good  Impulse Control:  Good   Risk Assessment: Danger to Self:  No Self-injurious Behavior: No Danger to Others: No Duty to Warn:no Physical Aggression / Violence:No  Access to Firearms a concern: No  Gang Involvement:No   Subjective: Patient was present for session. She shared that she was very triggered by her daughter due to her not doing her school work and than cut herself and telling her friend about it rather than patient. [name] is living with his girlfriend and is not helpful with daughter. She shared she had to put one of her dogs down and now she may have to put another one down.  Had patient think through what her daughter's cutting triggered for her.  She was able to report seeing her sister who does not take care of her children was triggered by that behavior, suds level 9, negative cognition "I am powerless" felt anger in her chest shoulders.  Patient was able to reduce suds level to 5.  She was able to recognize that her daughter is still a child and that I setting limits with her and continuing to work on her consistency hopefully she can make sure her daughter does not have similar choices to her sister.  She was also able to recognize she has done all she can to deal with  the situation with her sister as well.   Interventions: {[id number]}  Diagnosis:   ICD-10-CM   1. PTSD (post-traumatic stress disorder)  F43.10       Plan: ***  [name], [facility]

## 2024-02-27 ENCOUNTER — Ambulatory Visit (HOSPITAL_BASED_OUTPATIENT_CLINIC_OR_DEPARTMENT_OTHER)
Admission: RE | Admit: 2024-02-27 | Discharge: 2024-02-27 | Disposition: A | Discharge status: Home / Self Care | Payer: Self-pay | Source: Ambulatory Visit | Attending: Internal Medicine | Admitting: Internal Medicine

## 2024-02-27 DIAGNOSIS — Z0000 Encounter for general adult medical examination without abnormal findings: Secondary | ICD-10-CM | POA: Insufficient documentation

## 2024-03-02 NOTE — Progress Notes (Unsigned)
 [facility] [address] [place] [address] Phone: [phone] Subjective:   Barbara Wood, am serving as a scribe for Dr. Antoine Primas.  I'm seeing this patient by the request  of:  Barbara Wood, Barbara Patricia, MD  CC: Neck and back pain follow-up  [id number]  Barbara Wood is a 54 y.o. female coming in with complaint of back and neck pain. OMT 01/16/2024. Patient states needs you to take a look at hip. This hasn't been a great week for the hip.  Medications patient has been prescribed:   Taking:   Recently did have a CT calcium score of 0 Echocardiogram normal       Reviewed prior external information including notes and imaging from previsou exam, outside providers and external EMR if available.   As well as notes that were available from care everywhere and other healthcare systems.  Past medical history, social, surgical and family history all reviewed in electronic medical record.  No pertanent information unless stated regarding to the chief complaint.   Past Medical History:  Diagnosis Date   Allergy Age 10   Arthritis    shoulders, rt hip   Asthma Since age 71   Depression Age 56   Family history of adverse reaction to anesthesia    mother and sister   GERD (gastroesophageal reflux disease)    Hashimoto's disease    Heart murmur Mitral valve prolapse age 41   Migraine    PONV (postoperative nausea and vomiting)    Pre-diabetes    Psoriasis    Thyroid disease Since age 70    Allergies  Allergen Reactions   Amoxicillin-Pot Clavulanate Nausea And Vomiting    Augmentin causes nausea and vomiting   Sertraline Other (See Comments)    hallucinations    Sulfamethoxazole-Trimethoprim Hives   Triptans Other (See Comments)    Chest pain      Review of Systems:  No headache, visual changes, nausea, vomiting, diarrhea, constipation, dizziness, abdominal pain, skin rash, fevers, chills, night sweats, weight loss,  swollen lymph nodes, body aches, joint swelling, chest pain, shortness of breath, mood changes. POSITIVE muscle aches  Objective  Last menstrual period 10/31/2019.   General: No apparent distress alert and oriented x3 mood and affect normal, dressed appropriately.  HEENT: Pupils equal, extraocular movements intact  Respiratory: Patient's speak in full sentences and does not appear short of breath  Cardiovascular: No lower extremity edema, non tender, no erythema  Gait MSK:  Back   Osteopathic findings  C2 flexed rotated and side bent right C6 flexed rotated and side bent left T3 extended rotated and side bent right inhaled rib T9 extended rotated and side bent left L2 flexed rotated and side bent right Sacrum right on right       Assessment and Plan:  No problem-specific Assessment & Plan notes found for this encounter.    Nonallopathic problems  Decision today to treat with OMT was based on Physical Exam  After verbal consent patient was treated with HVLA, ME, FPR techniques in cervical, rib, thoracic, lumbar, and sacral  areas  Patient tolerated the procedure well with improvement in symptoms  Patient given exercises, stretches and lifestyle modifications  See medications in patient instructions if given  Patient will follow up in 4-8 weeks    The above documentation has been reviewed and is accurate and complete Judi Saa, DO          Note: This dictation was prepared  with Dragon dictation along with smaller phrase technology. Any transcriptional errors that result from this process are unintentional.

## 2024-03-03 ENCOUNTER — Encounter: Payer: Self-pay | Admitting: Internal Medicine

## 2024-03-04 ENCOUNTER — Ambulatory Visit: Payer: Commercial Managed Care - PPO | Admitting: Family Medicine

## 2024-03-04 VITALS — BP 112/76 | HR 82 | Ht 67.0 in | Wt 192.0 lb

## 2024-03-04 DIAGNOSIS — M5416 Radiculopathy, lumbar region: Secondary | ICD-10-CM

## 2024-03-04 DIAGNOSIS — M9904 Segmental and somatic dysfunction of sacral region: Secondary | ICD-10-CM | POA: Diagnosis not present

## 2024-03-04 DIAGNOSIS — M7061 Trochanteric bursitis, right hip: Secondary | ICD-10-CM | POA: Diagnosis not present

## 2024-03-04 DIAGNOSIS — M9908 Segmental and somatic dysfunction of rib cage: Secondary | ICD-10-CM | POA: Diagnosis not present

## 2024-03-04 DIAGNOSIS — M9903 Segmental and somatic dysfunction of lumbar region: Secondary | ICD-10-CM

## 2024-03-04 DIAGNOSIS — M9901 Segmental and somatic dysfunction of cervical region: Secondary | ICD-10-CM

## 2024-03-04 DIAGNOSIS — M9902 Segmental and somatic dysfunction of thoracic region: Secondary | ICD-10-CM | POA: Diagnosis not present

## 2024-03-04 NOTE — Patient Instructions (Signed)
 Injection in [place] today Good to see you! See you again in

## 2024-03-05 ENCOUNTER — Other Ambulatory Visit (HOSPITAL_COMMUNITY): Payer: Self-pay

## 2024-03-05 ENCOUNTER — Other Ambulatory Visit: Payer: Self-pay | Admitting: Internal Medicine

## 2024-03-05 ENCOUNTER — Encounter: Payer: Self-pay | Admitting: Family Medicine

## 2024-03-05 DIAGNOSIS — G43909 Migraine, unspecified, not intractable, without status migrainosus: Secondary | ICD-10-CM

## 2024-03-05 DIAGNOSIS — R7302 Impaired glucose tolerance (oral): Secondary | ICD-10-CM

## 2024-03-05 DIAGNOSIS — E66811 Obesity, class 1: Secondary | ICD-10-CM

## 2024-03-05 MED ORDER — PROMETHAZINE HCL 25 MG PO TABS
25.0000 mg | ORAL_TABLET | Freq: Four times a day (QID) | ORAL | 3 refills | Status: AC | PRN
  Filled 2024-03-05: qty 30, 8d supply, fill #0
  Filled 2024-07-20: qty 30, 8d supply, fill #1
  Filled 2024-11-22: qty 30, 8d supply, fill #2

## 2024-03-05 MED ORDER — TIRZEPATIDE-WEIGHT MANAGEMENT 2.5 MG/0.5ML ~~LOC~~ SOAJ
2.5000 mg | SUBCUTANEOUS | 0 refills | Status: DC
  Filled 2024-03-05: qty 2, 28d supply, fill #0

## 2024-03-05 NOTE — Assessment & Plan Note (Signed)
 Patient given injection and tolerated the procedure well, discussed icing regimen in which activities would be potentially more beneficial such as hip abductor strengthening as well as stretching of the hip flexors.  I continue to work on core and weight loss.  Follow-up again in 6 to 8 weeks

## 2024-03-05 NOTE — Assessment & Plan Note (Signed)
 Attempted osteopathic manipulation again.  Discussed posture and ergonomics.  Discussed which activities to do and which ones to avoid.  Held on any HVLA of the neck but did do some muscle energy.  Will continue to progress.  Discussed icing regimen, continue to work on posture and ergonomics.  Do feel underlying stress could be playing a role as well.  Follow-up again in 6 to 8 weeks

## 2024-03-06 ENCOUNTER — Other Ambulatory Visit (HOSPITAL_COMMUNITY): Payer: Self-pay

## 2024-03-07 ENCOUNTER — Other Ambulatory Visit: Payer: Self-pay | Admitting: Internal Medicine

## 2024-03-07 ENCOUNTER — Other Ambulatory Visit: Payer: Self-pay

## 2024-03-07 DIAGNOSIS — E6609 Other obesity due to excess calories: Secondary | ICD-10-CM

## 2024-03-07 MED ORDER — TIRZEPATIDE 5 MG/0.5ML ~~LOC~~ SOAJ
5.0000 mg | SUBCUTANEOUS | 0 refills | Status: DC
  Filled 2024-03-07 – 2024-04-01 (×3): qty 2, 28d supply, fill #0

## 2024-03-09 ENCOUNTER — Encounter (HOSPITAL_COMMUNITY): Payer: Self-pay

## 2024-03-09 ENCOUNTER — Other Ambulatory Visit (HOSPITAL_COMMUNITY): Payer: Self-pay

## 2024-03-10 ENCOUNTER — Other Ambulatory Visit: Payer: Self-pay | Admitting: Internal Medicine

## 2024-03-10 ENCOUNTER — Other Ambulatory Visit: Payer: Self-pay

## 2024-03-10 DIAGNOSIS — J4540 Moderate persistent asthma, uncomplicated: Secondary | ICD-10-CM

## 2024-03-10 DIAGNOSIS — G43909 Migraine, unspecified, not intractable, without status migrainosus: Secondary | ICD-10-CM

## 2024-03-10 DIAGNOSIS — M7918 Myalgia, other site: Secondary | ICD-10-CM

## 2024-03-11 ENCOUNTER — Other Ambulatory Visit (HOSPITAL_COMMUNITY): Payer: Self-pay

## 2024-03-11 ENCOUNTER — Encounter (HOSPITAL_COMMUNITY): Payer: Self-pay

## 2024-03-11 MED ORDER — BUDESONIDE-FORMOTEROL FUMARATE 160-4.5 MCG/ACT IN AERO
2.0000 | INHALATION_SPRAY | Freq: Two times a day (BID) | RESPIRATORY_TRACT | 3 refills | Status: DC
  Filled 2024-03-11 – 2024-03-17 (×2): qty 10.2, 30d supply, fill #0
  Filled 2024-04-20: qty 10.2, 30d supply, fill #1
  Filled 2024-05-26: qty 10.2, 30d supply, fill #2
  Filled 2024-06-23: qty 10.2, 30d supply, fill #3

## 2024-03-11 MED ORDER — TRAMADOL HCL 50 MG PO TABS
100.0000 mg | ORAL_TABLET | Freq: Two times a day (BID) | ORAL | 0 refills | Status: DC
  Filled 2024-03-11 – 2024-03-13 (×2): qty 360, 90d supply, fill #0

## 2024-03-12 ENCOUNTER — Ambulatory Visit: Payer: Commercial Managed Care - PPO | Admitting: Psychiatry

## 2024-03-12 DIAGNOSIS — F4310 Post-traumatic stress disorder, unspecified: Secondary | ICD-10-CM

## 2024-03-12 NOTE — Progress Notes (Unsigned)
 [facility] Counselor/Therapist Progress Note  Patient ID: Barbara Wood, MRN: [id number],    Date: 03/12/2024  Time Spent: 47  minutes start time 3:03 PM end time 3:50 PM  Treatment Type: Individual Therapy  Reported Symptoms: anxiety, triggered responses, sadness, rumination  Mental Status Exam:  Appearance:   Well Groomed     Behavior:  Appropriate  Motor:  Normal  Speech/Language:   Normal Rate  Affect:  Appropriate  Mood:  anxious  Thought process:  normal  Thought content:    WNL  Sensory/Perceptual disturbances:    WNL  Orientation:  oriented to person, place, time/date, and situation  Attention:  Good  Concentration:  Good  Memory:  WNL  Fund of knowledge:   Good  Insight:    Good  Judgment:   Good  Impulse Control:  Good   Risk Assessment: Danger to Self:  No Self-injurious Behavior: No Danger to Others: No Duty to Warn:no Physical Aggression / Violence:No  Access to Firearms a concern: No  Gang Involvement:No   Subjective: Patient was present for session. She shared that there are still issues with her daughter because of her lying.  Had patient think through what was getting triggered with her when her daughter was acting inappropriately.  Discussed the normal dynamics within her daughter at this age.  Patient was encouraged to recognize that with her setting limits and her daughter's father spending the weekends with him and he does not give her consequences it is going to be very difficult for her because her daughter is going to continue to challenge her.  Patient was encouraged to remind herself that her daughter's job is to challenge her and her job is to continue to set the limits.  She was reminded that she is doing the right things and she is doing a good job as a parent even when she feels frustrated because she feels she is angry and frustrated much of the time.  Discussed how it is okay for her daughter to see the anger and frustration at times and  that she can continue working at reducing that and letting her daughter just have the consequences for her choices.  The wording with her daughter encouraging her to use choices and consequences often and to remind her daughter that she is choosing what happens when she makes choices that she knows and then to get her in trouble.  Encouraged patient to continue working on her own self-care through this journey and to recognize when she feels something triggered by her daughter so that it can be processed in session.  Interventions: Cognitive Behavioral Therapy, Solution-Oriented/Positive Psychology, and Insight-Oriented  Diagnosis:   ICD-10-CM   1. PTSD (post-traumatic stress disorder)  F43.10       Plan:  Patient is to use coping skills to decrease triggered responses.  Patient is to continue setting limits with her daughter and ex-husband.  Patient is to get back to exercising regularly.  Patient is to continue trying to focus on the positive things that are happening in her life.  Patient is to use self spotting as needed to decrease anxiety and rumination.  Patient is to work on diaphragmatic breathing safe place exercise and stretching/yoga throughout the day.  Patient is to focus on what she can control fix and change.  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  Patient is to allow processing to continue between sessions .  Patient is to continue working on  limit setting with daughter and ex-husband.  Patient is to release negative emotions through exercise      [name], [facility]

## 2024-03-13 ENCOUNTER — Other Ambulatory Visit (HOSPITAL_COMMUNITY): Payer: Self-pay

## 2024-03-17 ENCOUNTER — Other Ambulatory Visit (HOSPITAL_COMMUNITY): Payer: Self-pay

## 2024-03-18 ENCOUNTER — Other Ambulatory Visit (HOSPITAL_COMMUNITY): Payer: Self-pay

## 2024-03-20 ENCOUNTER — Other Ambulatory Visit (HOSPITAL_COMMUNITY): Payer: Self-pay

## 2024-03-25 ENCOUNTER — Ambulatory Visit (INDEPENDENT_AMBULATORY_CARE_PROVIDER_SITE_OTHER): Payer: Commercial Managed Care - PPO | Admitting: Psychiatry

## 2024-03-25 ENCOUNTER — Other Ambulatory Visit (HOSPITAL_COMMUNITY): Payer: Self-pay

## 2024-03-25 DIAGNOSIS — G959 Disease of spinal cord, unspecified: Secondary | ICD-10-CM | POA: Diagnosis not present

## 2024-03-25 DIAGNOSIS — Z6828 Body mass index (BMI) 28.0-28.9, adult: Secondary | ICD-10-CM | POA: Diagnosis not present

## 2024-03-25 DIAGNOSIS — F4310 Post-traumatic stress disorder, unspecified: Secondary | ICD-10-CM | POA: Diagnosis not present

## 2024-03-25 MED ORDER — HYDROCODONE-ACETAMINOPHEN 5-325 MG PO TABS
1.0000 | ORAL_TABLET | Freq: Four times a day (QID) | ORAL | 0 refills | Status: AC | PRN
  Filled 2024-03-25: qty 60, 15d supply, fill #0

## 2024-03-25 NOTE — Progress Notes (Unsigned)
 [facility] Counselor/Therapist Progress Note  Patient ID: Barbara Wood, MRN: [id number],    Date: 03/25/2024  Time Spent: 45 minutes start time 1:05 PM end time 1:50 PM  Treatment Type: Individual Therapy  Reported Symptoms: anxiety, triggered responses  Mental Status Exam:  Appearance:   Well Groomed     Behavior:  Appropriate  Motor:  Normal  Speech/Language:   Normal Rate  Affect:  Appropriate  Mood:  normal  Thought process:  normal  Thought content:    WNL  Sensory/Perceptual disturbances:    WNL  Orientation:  oriented to person, place, time/date, and situation  Attention:  Good  Concentration:  Good  Memory:  WNL  Fund of knowledge:   Good  Insight:    Good  Judgment:   Good  Impulse Control:  Good   Risk Assessment: Danger to Self:  No Self-injurious Behavior: No Danger to Others: No Duty to Warn:no Physical Aggression / Violence:No  Access to Firearms a concern: No  Gang Involvement:No   Subjective: Patient was present for session. She shared she had to put another dog down and that was hard. She still has 2 dogs. She was able to deal with her daughter's lying without yelling.  Patient shared the different situations that occurred with her daughter that would have triggered her in the past.  Even though she got triggered she was able to use her tools and maintain calm when interacting with her daughter.  She had followed through on plans and let her daughter know what she has chosen and that she has consequences based on those choices.  Patient stated when she was able to let her daughter recognize what she had chosen and not get agitated it de-escalated things quickly and she felt better about the situation.  Patient stated that she had also had to do that with her ex-husband who is continuing to make bad choices including letting her daughter ride with him on the bus home that he was driving rather than communicating with patient that she would not be home at  4:00 as she usually is.  Patient shared she is recognizing more and more that she has to allow both of them to have consequences for their choices and set the limits rather than just letting things escalate and for her to be impacted negatively.  Patient is continuing to work on her CME's and sees that moving in a more positive direction as well.  Interventions: Solution-Oriented/Positive Psychology and Insight-Oriented  Diagnosis:   ICD-10-CM   1. PTSD (post-traumatic stress disorder)  F43.10       Plan: Patient is to use coping skills to decrease triggered responses.  Patient is to continue setting limits with her daughter and ex-husband.  Patient is to get back to exercising regularly.  Patient is to continue trying to focus on the positive things that are happening in her life.  Patient is to use self spotting as needed to decrease anxiety and rumination.  Patient is to work on diaphragmatic breathing safe place exercise and stretching/yoga throughout the day.  Patient is to focus on what she can control fix and change.  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  Patient is to allow processing to continue between sessions .  Patient is to continue working on limit setting with daughter and ex-husband.  Patient is to release negative emotions through exercise    [name], [facility]

## 2024-03-26 ENCOUNTER — Encounter: Payer: Self-pay | Admitting: Physician Assistant

## 2024-03-26 ENCOUNTER — Other Ambulatory Visit (HOSPITAL_COMMUNITY): Payer: Self-pay

## 2024-03-26 ENCOUNTER — Telehealth: Admitting: Physician Assistant

## 2024-03-26 ENCOUNTER — Encounter: Admitting: Physician Assistant

## 2024-03-26 DIAGNOSIS — J069 Acute upper respiratory infection, unspecified: Secondary | ICD-10-CM

## 2024-03-26 DIAGNOSIS — J45901 Unspecified asthma with (acute) exacerbation: Secondary | ICD-10-CM

## 2024-03-26 MED ORDER — PREDNISONE 50 MG PO TABS
50.0000 mg | ORAL_TABLET | Freq: Every day | ORAL | 0 refills | Status: AC
  Filled 2024-03-26: qty 5, 5d supply, fill #0

## 2024-03-26 MED ORDER — DOXYCYCLINE HYCLATE 100 MG PO CAPS
100.0000 mg | ORAL_CAPSULE | Freq: Two times a day (BID) | ORAL | 0 refills | Status: AC
  Filled 2024-03-26: qty 14, 7d supply, fill #0

## 2024-03-26 NOTE — Progress Notes (Signed)
 Pt completed an evisit and no longer needs the video visit.

## 2024-03-26 NOTE — Progress Notes (Signed)

## 2024-04-01 ENCOUNTER — Other Ambulatory Visit (HOSPITAL_COMMUNITY): Payer: Self-pay

## 2024-04-02 ENCOUNTER — Other Ambulatory Visit (HOSPITAL_COMMUNITY): Payer: Self-pay

## 2024-04-02 ENCOUNTER — Other Ambulatory Visit: Payer: Self-pay | Admitting: Internal Medicine

## 2024-04-02 DIAGNOSIS — E6609 Other obesity due to excess calories: Secondary | ICD-10-CM

## 2024-04-02 MED ORDER — ZEPBOUND 5 MG/0.5ML ~~LOC~~ SOAJ
5.0000 mg | SUBCUTANEOUS | 0 refills | Status: DC
  Filled 2024-04-02: qty 2, 28d supply, fill #0

## 2024-04-04 ENCOUNTER — Other Ambulatory Visit: Payer: Self-pay | Admitting: Internal Medicine

## 2024-04-04 ENCOUNTER — Other Ambulatory Visit (HOSPITAL_COMMUNITY): Payer: Self-pay

## 2024-04-04 DIAGNOSIS — G4700 Insomnia, unspecified: Secondary | ICD-10-CM

## 2024-04-06 ENCOUNTER — Other Ambulatory Visit: Payer: Self-pay

## 2024-04-06 ENCOUNTER — Other Ambulatory Visit (HOSPITAL_COMMUNITY): Payer: Self-pay

## 2024-04-06 MED ORDER — BACLOFEN 5 MG PO TABS
5.0000 mg | ORAL_TABLET | Freq: Three times a day (TID) | ORAL | 3 refills | Status: DC | PRN
  Filled 2024-04-06 – 2024-04-08 (×2): qty 180, 30d supply, fill #0
  Filled 2024-05-19: qty 180, 30d supply, fill #1
  Filled 2024-06-23: qty 180, 30d supply, fill #2
  Filled 2024-08-19: qty 180, 30d supply, fill #3

## 2024-04-06 MED ORDER — ARIPIPRAZOLE 10 MG PO TABS
10.0000 mg | ORAL_TABLET | Freq: Every day | ORAL | 0 refills | Status: DC
  Filled 2024-04-06: qty 90, 90d supply, fill #0

## 2024-04-06 MED ORDER — TRAZODONE HCL 50 MG PO TABS
50.0000 mg | ORAL_TABLET | Freq: Every evening | ORAL | 1 refills | Status: DC | PRN
  Filled 2024-04-06: qty 90, 45d supply, fill #0
  Filled 2024-05-19: qty 90, 45d supply, fill #1

## 2024-04-07 ENCOUNTER — Ambulatory Visit: Admitting: Psychiatry

## 2024-04-07 DIAGNOSIS — F4310 Post-traumatic stress disorder, unspecified: Secondary | ICD-10-CM | POA: Diagnosis not present

## 2024-04-07 NOTE — Progress Notes (Unsigned)
 [facility] Counselor/Therapist Progress Note  Patient ID: Barbara Wood, MRN: [id number],    Date: 04/07/2024  Time Spent: 47 minutes start time 4:52 PM end time 5:39 PM Virtual Visit via Video Note Connected with patient by a telemedicine/telehealth application, with their informed consent, and verified patient privacy and that I am speaking with the correct person using two identifiers. I discussed the limitations, risks, security and privacy concerns of performing psychotherapy and the availability of in person appointments. I also discussed with the patient that there may be a patient responsible charge related to this service. The patient expressed understanding and agreed to proceed. I discussed the treatment planning with the patient. The patient was provided an opportunity to ask questions and all were answered. The patient agreed with the plan and demonstrated an understanding of the instructions. The patient was advised to call  our office if  symptoms worsen or feel they are in a crisis state and need immediate contact.   Therapist Location: home Patient Location: home    Treatment Type: Individual Therapy  Reported Symptoms: triggered responses, rumination, anxiety  Mental Status Exam:  Appearance:   Well Groomed     Behavior:  Appropriate  Motor:  Normal  Speech/Language:   Normal Rate  Affect:  Appropriate  Mood:  normal  Thought process:  normal  Thought content:    WNL  Sensory/Perceptual disturbances:    WNL  Orientation:  oriented to person, place, time/date, and situation  Attention:  Good  Concentration:  Good  Memory:  WNL  Fund of knowledge:   Good  Insight:    Good  Judgment:   Good  Impulse Control:  Good   Risk Assessment: Danger to Self:  No Self-injurious Behavior: No Danger to Others: No Duty to Warn:no Physical Aggression / Violence:No  Access to Firearms a concern: No  Gang Involvement:No   Subjective: Met with patient via virtual  session. She shared that things are stable. She was able to work on her hobbies over the weekend. She shared that she is getting her CME completed. She was able to set a limit with her daughter and maintain her emotions. She has also set up a plan with her daughter regarding to make sure she gets to school on time every day without having to get upset and take on the solution planning her self.  Patient was encouraged to think through how she was feeling about the recent changes.  She was able to recognize a huge decrease in getting triggered and irritability which has helped her feel more peaceful at home.  Discussed what she was telling herself and ways to continue progress with her daughter.  Patient also shared she is making sure she does the same thing with her ex-husband and has decided she is going to let him have consequences for his choices.  Patient explained that by setting the limits and using CBT skills to help maintain perspective it has improved her mood.  Patient shared that the 1 thing that is difficult for her is that she and her best friend are not communicating well.  Patient went on to explain that her best friend was somebody she meant and Alateen when her sister was being really mean to her she stepped in to be more of a friend and mentor role.  Patient explains that she has a tendency when she gets into relationships that she distance and does not have the contact.  Patient went on to explain  she is having some concerns about the current relationship her friend is involved.  Encouraged her to recognize she can only focus on what she can do and even though it is difficult may have to let things play out with her friend and just be the person she knows she needs to be.  Encouraged her to talk herself through the situation reminding herself that it is not her fault.  Interventions: Cognitive Behavioral Therapy, Solution-Oriented/Positive Psychology, and Insight-Oriented  Diagnosis:    ICD-10-CM   1. PTSD (post-traumatic stress disorder)  F43.10       Plan:  Patient is to use coping skills to decrease triggered responses.  Patient is to continue setting limits with her daughter and ex-husband.  Patient is to get back to exercising regularly.  Patient is to continue trying to focus on the positive things that are happening in her life.  Patient is to use self spotting as needed to decrease anxiety and rumination.  Patient is to work on diaphragmatic breathing safe place exercise and stretching/yoga throughout the day.  Patient is to focus on what she can control fix and change.  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  Patient is to allow processing to continue between sessions .  Patient is to continue working on limit setting with daughter and ex-husband.  Patient is to release negative emotions through exercise      [name], [facility]

## 2024-04-08 ENCOUNTER — Encounter: Payer: Self-pay | Admitting: Internal Medicine

## 2024-04-08 ENCOUNTER — Other Ambulatory Visit (HOSPITAL_COMMUNITY): Payer: Self-pay

## 2024-04-08 ENCOUNTER — Ambulatory Visit (INDEPENDENT_AMBULATORY_CARE_PROVIDER_SITE_OTHER): Payer: Commercial Managed Care - PPO | Admitting: Internal Medicine

## 2024-04-08 VITALS — BP 110/80 | HR 79 | Temp 97.8°F | Wt 184.6 lb

## 2024-04-08 DIAGNOSIS — E6609 Other obesity due to excess calories: Secondary | ICD-10-CM | POA: Diagnosis not present

## 2024-04-08 DIAGNOSIS — E66811 Obesity, class 1: Secondary | ICD-10-CM | POA: Diagnosis not present

## 2024-04-08 DIAGNOSIS — Z6832 Body mass index (BMI) 32.0-32.9, adult: Secondary | ICD-10-CM

## 2024-04-08 DIAGNOSIS — R7302 Impaired glucose tolerance (oral): Secondary | ICD-10-CM | POA: Diagnosis not present

## 2024-04-08 LAB — POCT GLYCOSYLATED HEMOGLOBIN (HGB A1C): Hemoglobin A1C: 5.3 % (ref 4.0–5.6)

## 2024-04-08 MED ORDER — ZEPBOUND 10 MG/0.5ML ~~LOC~~ SOLN: 10.0000 mg | SUBCUTANEOUS | 2 refills | Status: DC

## 2024-04-08 NOTE — Progress Notes (Signed)
 Established Patient Office Visit     CC/Reason for Visit: Follow-up impaired glucose tolerance and obesity  HPI: Barbara Wood is a 54 y.o. female who is coming in today for the above mentioned reasons. Past Medical History is significant for: Was started on Zepbound for both obesity and impaired glucose tolerance.  Other than some constipation has been tolerating it well.  Is no longer having nausea.   Past Medical/Surgical History: Past Medical History:  Diagnosis Date   Allergy Age 12   Arthritis    shoulders, rt hip   Asthma Since age 54   Depression Age 48   Family history of adverse reaction to anesthesia    mother and sister   GERD (gastroesophageal reflux disease)    Hashimoto's disease    Heart murmur Mitral valve prolapse age 12   Migraine    PONV (postoperative nausea and vomiting)    Pre-diabetes    Psoriasis    Thyroid disease Since age 50    Past Surgical History:  Procedure Laterality Date   BREAST SURGERY  Reduction age 59   CERVICAL DISC ARTHROPLASTY N/A 11/27/2022   Procedure: ARTIFICIAL DISC REPLACEMENT Cervical five-six;  Surgeon: Bethann Goo, DO;  Location: [facility] OR;  Service: Neurosurgery;  Laterality: N/A;   CHOLECYSTECTOMY  Age 66   KNEE ARTHROSCOPY W/ LATERAL RELEASE Right    REDUCTION MAMMAPLASTY     WISDOM TOOTH EXTRACTION      Social History:  reports that she has never smoked. She has never been exposed to tobacco smoke. She has never used smokeless tobacco. She reports current alcohol use. She reports current drug use.  Allergies: Allergies  Allergen Reactions   Amoxicillin-Pot Clavulanate Nausea And Vomiting    Augmentin causes nausea and vomiting   Sertraline Other (See Comments)    hallucinations    Sulfamethoxazole-Trimethoprim Hives   Triptans Other (See Comments)    Chest pain     Family History:  Family History  Problem Relation Age of Onset   Cancer Mother    Depression Mother    Hashimoto's thyroiditis Mother     Diabetes Mother    Bipolar disorder Mother    Hypertension Father    Ankylosing spondylitis Father    Psoriasis Father    Asthma Sister    Miscarriages / [place] Sister    Depression Sister    Psoriasis Sister    Psoriasis Sister    Arthritis Sister        psoriatic arthritis    Psoriasis Sister    Rheum arthritis Sister    Depression Sister    Anxiety disorder Sister    Eczema Sister    Psoriasis Brother    Migraines Brother    ADD / ADHD Brother    Eczema Brother    Allergies Brother    Psoriasis Brother    Psoriasis Maternal Aunt    ADD / ADHD Daughter    Eczema Daughter    Asthma Daughter    Hypermobility Daughter      Current Outpatient Medications:    albuterol (PROAIR HFA) 108 (90 Base) MCG/ACT inhaler, Inhale 2 puffs into the lungs every 6 (six) hours as needed for wheezing or shortness of breath., Disp: 6.7 g, Rfl: 2   albuterol (VENTOLIN HFA) 108 (90 Base) MCG/ACT inhaler, Inhale 2 puffs into the lungs every 6 (six) hours as needed for wheezing or shortness of breath, Disp: 6.7 g, Rfl: 1   ARIPiprazole (ABILIFY) 10 MG tablet, Take  1 tablet (10 mg total) by mouth daily., Disp: 90 tablet, Rfl: 0   atenolol (TENORMIN) 25 MG tablet, Take 2 tablets (50 mg total) by mouth daily., Disp: 180 tablet, Rfl: 1   Baclofen 5 MG TABS, Take 1-2 tablets (5-10 mg total) by mouth 3 (three) times daily as needed., Disp: 180 tablet, Rfl: 3   betamethasone dipropionate 0.05 % cream, Apply topically 2 (two) times daily., Disp: 45 g, Rfl: 5   betamethasone dipropionate 0.05 % lotion, Apply 1 application topically to the scalp daily. Taper use as able., Disp: 120 mL, Rfl: 3   Budesonide (RHINOCORT ALLERGY NA), Place 1 spray into both nostrils 2 (two) times daily., Disp: , Rfl:    budesonide-formoterol (SYMBICORT) 160-4.5 MCG/ACT inhaler, Inhale 2 puffs into the lungs 2 (two) times daily., Disp: 10.2 g, Rfl: 3   cetirizine (ZYRTEC) 10 MG tablet, Take 1 tablet by mouth daily., Disp: ,  Rfl:    Cholecalciferol (VITAMIN D3) 250 MCG (10000 UT) capsule, Take 10,000 Units by mouth daily., Disp: , Rfl:    conjugated estrogens (PREMARIN) vaginal cream, Place 0.5 g vaginally 3 (three) times a week., Disp: 30 g, Rfl: 12   doxycycline (VIBRA-TABS) 100 MG tablet, Take 1 tablet (100 mg total) by mouth 2 (two) times daily., Disp: 20 tablet, Rfl: 0   estradiol (ESTRACE) 1 MG tablet, Take 1 tablet (1 mg total) by mouth daily., Disp: 90 tablet, Rfl: 1   HYDROcodone-acetaminophen (NORCO/VICODIN) 5-325 MG tablet, Take 1 tablet by mouth every 6 (six) hours as needed for pain., Disp: 30 tablet, Rfl: 0   HYDROcodone-acetaminophen (NORCO/VICODIN) 5-325 MG tablet, Take 1 tablet by mouth every 6 (six) hours as needed for pain, Disp: 60 tablet, Rfl: 0   HYDROcodone-acetaminophen (NORCO/VICODIN) 5-325 MG tablet, Take 1 tablet by mouth every 6 (six) hours as needed for pain., Disp: 60 tablet, Rfl: 0   levothyroxine (SYNTHROID) 100 MCG tablet, Take 1 tablet (100 mcg total) by mouth daily., Disp: , Rfl:    levothyroxine (SYNTHROID) 100 MCG tablet, Take 1 tablet (100 mcg total) by mouth daily., Disp: 90 tablet, Rfl: 1   lidocaine (LIDODERM) 5 %, Place 1 patch onto the skin daily. Remove & Discard patch within 12 hours or as directed by MD, Disp: 30 patch, Rfl: 2   lisdexamfetamine (VYVANSE) 50 MG capsule, Take 1 capsule (50 mg total) by mouth daily., Disp: 30 capsule, Rfl: 0   lisdexamfetamine (VYVANSE) 50 MG capsule, Take 1 capsule (50 mg total) by mouth daily., Disp: 30 capsule, Rfl: 0   lisdexamfetamine (VYVANSE) 50 MG capsule, Take 1 capsule (50 mg total) by mouth daily., Disp: 30 capsule, Rfl: 0   meclizine (ANTIVERT) 12.5 MG tablet, Take 1 tablet (12.5 mg total) by mouth 3 (three) times daily as needed for dizziness., Disp: 10 tablet, Rfl: 0   metaxalone (SKELAXIN) 800 MG tablet, Take 1 tablet (800 mg total) by mouth 3 (three) times daily., Disp: 90 tablet, Rfl: 1   montelukast (SINGULAIR) 10 MG tablet,  Take 1 tablet (10 mg total) by mouth at bedtime., Disp: 90 tablet, Rfl: 1   NON FORMULARY, Vyepti infusions for migraine, Disp: , Rfl:    omeprazole (PRILOSEC) 20 MG capsule, Take 1 capsule (20 mg total) by mouth daily., Disp: 30 capsule, Rfl: 3   progesterone (PROMETRIUM) 100 MG capsule, Take 1 capsule (100 mg total) by mouth daily., Disp: 90 capsule, Rfl: 4   promethazine (PHENERGAN) 25 MG tablet, Take 1 tablet (25 mg total) by mouth  every 6 (six) hours as needed for nausea or vomiting., Disp: 30 tablet, Rfl: 3   Rimegepant Sulfate ([name]) 75 MG TBDP, Take 1 tablet (75 mg total) by mouth daily as needed (migraine). Max dose 1 pill in 24 hours, Disp: 8 tablet, Rfl: 6   tirzepatide (ZEPBOUND) 10 MG/0.5ML injection vial, Inject 10 mg into the skin once a week., Disp: 2 mL, Rfl: 2   traMADol (ULTRAM) 50 MG tablet, Take 2 tablets (100 mg total) by mouth 2 (two) times daily., Disp: 360 tablet, Rfl: 0   traZODone (DESYREL) 50 MG tablet, Take 1-2 tablets (50-100 mg total) by mouth at bedtime as needed for sleep., Disp: 90 tablet, Rfl: 1   triamcinolone cream (KENALOG) 0.1 %, Apply 1 Application topically 2 (two) times daily., Disp: 454 g, Rfl: 2  Review of Systems:  Negative unless indicated in HPI.   Physical Exam: Vitals:   04/08/24 1335  BP: 110/80  Pulse: 79  Temp: 97.8 F (36.6 C)  TempSrc: Oral  SpO2: 98%  Weight: 184 lb 9.6 oz (83.7 kg)    Body mass index is 28.91 kg/m.     Impression and Plan:  IGT (impaired glucose tolerance) -     POCT glycosylated hemoglobin (Hb A1C) -     Zepbound; Inject 10 mg into the skin once a week.  Dispense: 2 mL; Refill: 2  Class 1 obesity due to excess calories with serious comorbidity and body mass index (BMI) of 32.0 to 32.9 in adult -     Zepbound; Inject 10 mg into the skin once a week.  Dispense: 2 mL; Refill: 2  -Increase Zepbound to 10 mg weekly.  Follow-up in 3 months.   Time spent:22 minutes reviewing chart, interviewing and  examining patient and formulating plan of care.     Chaya Jan, MD [facility]

## 2024-04-20 ENCOUNTER — Other Ambulatory Visit: Payer: Self-pay | Admitting: Internal Medicine

## 2024-04-20 ENCOUNTER — Other Ambulatory Visit (HOSPITAL_COMMUNITY): Payer: Self-pay

## 2024-04-20 DIAGNOSIS — N951 Menopausal and female climacteric states: Secondary | ICD-10-CM

## 2024-04-20 DIAGNOSIS — F900 Attention-deficit hyperactivity disorder, predominantly inattentive type: Secondary | ICD-10-CM

## 2024-04-20 MED ORDER — ALBUTEROL SULFATE HFA 108 (90 BASE) MCG/ACT IN AERS
2.0000 | INHALATION_SPRAY | Freq: Four times a day (QID) | RESPIRATORY_TRACT | 1 refills | Status: AC | PRN
Start: 2024-04-20 — End: ?
  Filled 2024-04-20: qty 6.7, 25d supply, fill #0

## 2024-04-20 MED ORDER — ESTRADIOL 1 MG PO TABS
1.0000 mg | ORAL_TABLET | Freq: Every day | ORAL | 1 refills | Status: DC
  Filled 2024-04-20 – 2024-06-30 (×2): qty 90, 90d supply, fill #0
  Filled 2024-09-29: qty 90, 90d supply, fill #1

## 2024-04-20 NOTE — Progress Notes (Unsigned)
 [facility] [address] [place] [address] Phone: [phone] Subjective:   Barbara Wood am a scribe for Dr. Felipe Horton.   I'm seeing this patient by the request  of:  Zilphia Hilt, Charyl Coppersmith, MD  CC: back and neck pain follow up   [id number]  Barbara Wood is a 54 y.o. female coming in with complaint of back and neck pain. OMT on 03/04/2024. Patient states that would like injections in the shoulders and in the left hip injections.   Medications patient has been prescribed: no  Taking: none         Reviewed prior external information including notes and imaging from previsou exam, outside providers and external EMR if available.   As well as notes that were available from care everywhere and other healthcare systems.  Past medical history, social, surgical and family history all reviewed in electronic medical record.  No pertanent information unless stated regarding to the chief complaint.   Past Medical History:  Diagnosis Date   Allergy Age 58   Arthritis    shoulders, rt hip   Asthma Since age 65   Depression Age 6   Family history of adverse reaction to anesthesia    mother and sister   GERD (gastroesophageal reflux disease)    Hashimoto's disease    Heart murmur Mitral valve prolapse age 76   Migraine    PONV (postoperative nausea and vomiting)    Pre-diabetes    Psoriasis    Thyroid  disease Since age 45    Allergies  Allergen Reactions   Amoxicillin-Pot Clavulanate Nausea And Vomiting    Augmentin causes nausea and vomiting   Sertraline Other (See Comments)    hallucinations    Sulfamethoxazole-Trimethoprim Hives   Triptans Other (See Comments)    Chest pain      Review of Systems:  No headache, visual changes, nausea, vomiting, diarrhea, constipation, dizziness, abdominal pain, skin rash, fevers, chills, night sweats, weight loss, swollen lymph nodes, body aches, joint swelling, chest pain, shortness of  breath, mood changes. POSITIVE muscle aches  Objective  Last menstrual period 10/31/2019.   General: No apparent distress alert and oriented x3 mood and affect normal, dressed appropriately.  HEENT: Pupils equal, extraocular movements intact  Respiratory: Patient's speak in full sentences and does not appear short of breath  Cardiovascular: No lower extremity edema, non tender, no erythema  Gait MSK:  Back   Osteopathic findings  C2 flexed rotated and side bent right C6 flexed rotated and side bent left T3 extended rotated and side bent right inhaled rib T9 extended rotated and side bent left L2 flexed rotated and side bent right Sacrum right on right       Assessment and Plan:  No problem-specific Assessment & Plan notes found for this encounter.    Nonallopathic problems  Decision today to treat with OMT was based on Physical Exam  After verbal consent patient was treated with HVLA, ME, FPR techniques in cervical, rib, thoracic, lumbar, and sacral  areas  Patient tolerated the procedure well with improvement in symptoms  Patient given exercises, stretches and lifestyle modifications  See medications in patient instructions if given  Patient will follow up in 4-8 weeks    The above documentation has been reviewed and is accurate and complete Zygmund Passero M Jax Kentner, DO          Note: This dictation was prepared with Dragon dictation along with smaller phrase technology. Any transcriptional errors that  result from this process are unintentional.

## 2024-04-21 ENCOUNTER — Other Ambulatory Visit: Payer: Self-pay

## 2024-04-21 ENCOUNTER — Other Ambulatory Visit (HOSPITAL_COMMUNITY): Payer: Self-pay

## 2024-04-21 ENCOUNTER — Ambulatory Visit: Admitting: Psychiatry

## 2024-04-21 DIAGNOSIS — F4310 Post-traumatic stress disorder, unspecified: Secondary | ICD-10-CM | POA: Diagnosis not present

## 2024-04-21 MED ORDER — LISDEXAMFETAMINE DIMESYLATE 50 MG PO CAPS
50.0000 mg | ORAL_CAPSULE | Freq: Every day | ORAL | 0 refills | Status: DC
Start: 2024-06-21 — End: 2024-07-20
  Filled 2024-04-21 – 2024-06-23 (×2): qty 30, 30d supply, fill #0

## 2024-04-21 MED ORDER — LISDEXAMFETAMINE DIMESYLATE 50 MG PO CAPS
50.0000 mg | ORAL_CAPSULE | Freq: Every day | ORAL | 0 refills | Status: DC
  Filled 2024-04-21 – 2024-05-21 (×2): qty 30, 30d supply, fill #0

## 2024-04-21 MED ORDER — LISDEXAMFETAMINE DIMESYLATE 50 MG PO CAPS
50.0000 mg | ORAL_CAPSULE | Freq: Every day | ORAL | 0 refills | Status: DC
  Filled 2024-04-21: qty 30, 30d supply, fill #0

## 2024-04-21 NOTE — Progress Notes (Unsigned)
 [facility] Counselor/Therapist Progress Note  Patient ID: Barbara Wood, MRN: [id number],    Date: 04/21/2024  Time Spent: 54 minutes start time 3:57 PM end time 4:51 PM Virtual Visit via Video Note Connected with patient by a telemedicine/telehealth application, with their informed consent, and verified patient privacy and that I am speaking with the correct person using two identifiers. I discussed the limitations, risks, security and privacy concerns of performing psychotherapy and the availability of in person appointments. I also discussed with the patient that there may be a patient responsible charge related to this service. The patient expressed understanding and agreed to proceed. I discussed the treatment planning with the patient. The patient was provided an opportunity to ask questions and all were answered. The patient agreed with the plan and demonstrated an understanding of the instructions. The patient was advised to call  our office if  symptoms worsen or feel they are in a crisis state and need immediate contact.   Therapist Location: home Patient Location: home    Treatment Type: Individual Therapy  Reported Symptoms: triggered responses,   Mental Status Exam:  Appearance:   Well Groomed     Behavior:  Appropriate  Motor:  Normal  Speech/Language:   Normal Rate  Affect:  Appropriate  Mood:  anxious  Thought process:  normal  Thought content:    WNL  Sensory/Perceptual disturbances:    WNL  Orientation:  oriented to person, place, time/date, and situation  Attention:  Good  Concentration:  Good  Memory:  WNL  Fund of knowledge:   Good  Insight:    Good  Judgment:   Good  Impulse Control:  Good   Risk Assessment: Danger to Self:  No Self-injurious Behavior: No Danger to Others: No Duty to Warn:no Physical Aggression / Violence:No  Access to Firearms a concern: No  Gang Involvement:No   Subjective: Met with patient via virtual session. She shared  that the trip with her daughter and a friend over Spring Break and it went well overall. She went on to share that her ex is not being responsible again with their daughter. She shared it is triggering and frustrating for her. She went on to share that her friend's husband is triggering for her due to him reminding her of her dad .  Patient shared that when her friend talks about things as she needs to it agitates her and she gets very frustrated and knows she needs to work through the issue. Picture friend talking about the things ex does SUDS level 9, negative cognition "I should be able to stop it" feeling anger in her eyes and chest.  Patient was able to reduce his level to 3.  Through the processing she was able to recognize that she can let go of feeling that she should stop things and that she also has been a good friend and supported her friend and helped her see where she needed to do things differently to protect her children.  Patient also has been a support for friend's children and has helped them to see the way things should be rather than the way things are.  Patient was encouraged to remind herself of those facts/truce when she starts feeling triggered.  Patient also shared she is continuing to get CMEs completed and has a plan to get to where she needs to by the end of the year.  Also encouraged patient to continue setting appropriate limits with her own husband and daughter  and to feel good about the fact that she is talking with her daughter about the things going on with her dad in a respectful manner and empowering her to make to some decisions on how she wants to proceed when he is not being present for her the way he should be.    Interventions: Eye Movement Desensitization and Reprocessing (EMDR) and Insight-Oriented  Diagnosis:   ICD-10-CM   1. PTSD (post-traumatic stress disorder)  F43.10       Plan: Patient is to use coping skills to decrease triggered responses.  Patient is  to continue setting limits with her daughter and ex-husband.  Patient is to get back to exercising regularly.  Patient is to continue trying to focus on the positive things that are happening in her life.  Patient is to use self spotting as needed to decrease anxiety and rumination.  Patient is to work on diaphragmatic breathing safe place exercise and stretching/yoga throughout the day.  Patient is to focus on what she can control fix and change.  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  Patient is to allow processing to continue between sessions .  Patient is to continue working on limit setting with daughter and ex-husband.  Patient is to release negative emotions through exercise    Marlise Simpers, LCMHC

## 2024-04-22 ENCOUNTER — Other Ambulatory Visit: Payer: Self-pay

## 2024-04-22 ENCOUNTER — Ambulatory Visit: Admitting: Family Medicine

## 2024-04-22 VITALS — BP 110/70 | HR 84 | Ht 67.0 in

## 2024-04-22 DIAGNOSIS — M9901 Segmental and somatic dysfunction of cervical region: Secondary | ICD-10-CM | POA: Diagnosis not present

## 2024-04-22 DIAGNOSIS — M9904 Segmental and somatic dysfunction of sacral region: Secondary | ICD-10-CM

## 2024-04-22 DIAGNOSIS — M9908 Segmental and somatic dysfunction of rib cage: Secondary | ICD-10-CM

## 2024-04-22 DIAGNOSIS — M25512 Pain in left shoulder: Secondary | ICD-10-CM | POA: Diagnosis not present

## 2024-04-22 DIAGNOSIS — M7062 Trochanteric bursitis, left hip: Secondary | ICD-10-CM

## 2024-04-22 DIAGNOSIS — M25511 Pain in right shoulder: Secondary | ICD-10-CM | POA: Diagnosis not present

## 2024-04-22 DIAGNOSIS — M9902 Segmental and somatic dysfunction of thoracic region: Secondary | ICD-10-CM | POA: Diagnosis not present

## 2024-04-22 DIAGNOSIS — M25412 Effusion, left shoulder: Secondary | ICD-10-CM | POA: Diagnosis not present

## 2024-04-22 DIAGNOSIS — M9903 Segmental and somatic dysfunction of lumbar region: Secondary | ICD-10-CM

## 2024-04-22 NOTE — Patient Instructions (Signed)
 Injected shoulder in 2 spots Injected hip See me again in

## 2024-04-23 ENCOUNTER — Encounter: Payer: Self-pay | Admitting: Family Medicine

## 2024-04-23 DIAGNOSIS — M7062 Trochanteric bursitis, left hip: Secondary | ICD-10-CM | POA: Insufficient documentation

## 2024-04-23 NOTE — Assessment & Plan Note (Signed)
 Injection given and tolerated the procedure well, discussed icing regimen and home exercises, which activities to do and which ones to avoid.  Patient will continue to work on hip abductor strengthening.

## 2024-04-23 NOTE — Assessment & Plan Note (Signed)
 Chronic, with worsening symptoms.  Responded well to injections previously and hopeful we will see it again.  Discussed icing regimen and home exercises, which activities to do and which ones to avoid.  Increase activity slowly.  Follow-up again in 6 to 8 weeks.

## 2024-04-29 ENCOUNTER — Other Ambulatory Visit: Payer: Self-pay

## 2024-04-29 ENCOUNTER — Telehealth: Payer: Self-pay | Admitting: Neurology

## 2024-04-29 ENCOUNTER — Telehealth: Payer: Commercial Managed Care - PPO | Admitting: Neurology

## 2024-04-29 ENCOUNTER — Other Ambulatory Visit (HOSPITAL_COMMUNITY): Payer: Self-pay

## 2024-04-29 DIAGNOSIS — G43009 Migraine without aura, not intractable, without status migrainosus: Secondary | ICD-10-CM

## 2024-04-29 DIAGNOSIS — G43119 Migraine with aura, intractable, without status migrainosus: Secondary | ICD-10-CM | POA: Diagnosis not present

## 2024-04-29 MED ORDER — MECLIZINE HCL 12.5 MG PO TABS
12.5000 mg | ORAL_TABLET | Freq: Three times a day (TID) | ORAL | 11 refills | Status: DC | PRN
  Filled 2024-04-29: qty 10, 4d supply, fill #0

## 2024-04-29 MED ORDER — SODIUM CHLORIDE 0.9 % IV SOLN: 300.0000 mg | INTRAVENOUS | 4 refills | Status: AC

## 2024-04-29 MED ORDER — NURTEC 75 MG PO TBDP
1.0000 | ORAL_TABLET | Freq: Every day | ORAL | 11 refills | Status: DC | PRN
  Filled 2024-04-29: qty 8, 30d supply, fill #0
  Filled 2024-06-10: qty 8, 30d supply, fill #1
  Filled 2024-08-03: qty 8, 30d supply, fill #2
  Filled 2024-09-01: qty 8, 30d supply, fill #3
  Filled 2024-09-29: qty 8, 30d supply, fill #4
  Filled 2024-11-02: qty 8, 30d supply, fill #5

## 2024-04-29 NOTE — Telephone Encounter (Signed)
 Called patient to schedule follow up. Patient is requesting an appointment on Wednesday afternoons. However, Barbara Wood's schedule is blocked during that time. I scheduled [name] with Dr. Tresia Fruit for 01/20/25 at 1:30pm
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
## 2024-04-29 NOTE — Telephone Encounter (Signed)
 Please schedule video follow up for med check in January with Carolan millikan thank you

## 2024-05-05 ENCOUNTER — Ambulatory Visit (INDEPENDENT_AMBULATORY_CARE_PROVIDER_SITE_OTHER): Admitting: Psychiatry

## 2024-05-05 ENCOUNTER — Other Ambulatory Visit (HOSPITAL_COMMUNITY): Payer: Self-pay

## 2024-05-05 DIAGNOSIS — F4310 Post-traumatic stress disorder, unspecified: Secondary | ICD-10-CM | POA: Diagnosis not present

## 2024-05-05 NOTE — Progress Notes (Signed)
 [facility] Counselor/Therapist Progress Note  Patient ID: Barbara Wood, MRN: [id number],    Date: 05/05/2024  Time Spent: 52 minutes start time 4:00 PM end time 4:52 PM Virtual Visit via Video Note Connected with patient by a telemedicine/telehealth application, with their informed consent, and verified patient privacy and that I am speaking with the correct person using two identifiers. I discussed the limitations, risks, security and privacy concerns of performing psychotherapy and the availability of in person appointments. I also discussed with the patient that there may be a patient responsible charge related to this service. The patient expressed understanding and agreed to proceed. I discussed the treatment planning with the patient. The patient was provided an opportunity to ask questions and all were answered. The patient agreed with the plan and demonstrated an understanding of the instructions. The patient was advised to call  our office if  symptoms worsen or feel they are in a crisis state and need immediate contact.   Therapist Location: home Patient Location: home    Treatment Type: Individual Therapy  Reported Symptoms: Sadness, triggered responses, anxiety  Mental Status Exam:  Appearance:   Well Groomed     Behavior:  Appropriate  Motor:  Normal  Speech/Language:   Normal Rate  Affect:  Appropriate  Mood:  anxious  Thought process:  normal  Thought content:    WNL  Sensory/Perceptual disturbances:    WNL  Orientation:  oriented to person, place, time/date, and situation  Attention:  Good  Concentration:  Good  Memory:  WNL  Fund of knowledge:   Good  Insight:    Good  Judgment:   Good  Impulse Control:  Good   Risk Assessment: Danger to Self:  No Self-injurious Behavior: No Danger to Others: No Duty to Warn:no Physical Aggression / Violence:No  Access to Firearms a concern: No  Gang Involvement:No   Subjective: Met with patient via virtual  session. She shared that her best friend is not communicating with her due to her boyfriend and it has triggered her abandonment issues.  Patient was allowed time to discuss the situation and think through, what was realistic for her to be able to do.  Through the processing she was able to recognize that at this point she can do this given an invitation  to her friend to contact her when she has time like to but being critical about the situation will not be helpful.  Patient has encouraged to find healthy ways to release the emotions that surface to her situation and to make sure she takes care of herself as she is dealing with something that is very stressful.  The patient stated that things continue to be very difficult with her ex who is continuing to not be responsible and she is having to deal with his responsibility.  Encouraged patient to allow him to feel the consequences of his poor choices and not feel that she needs to fix things for him only take care of things for her daughter.  Patient also reported she is continuing to lose weight the medication she is currently taking for that issue and that has been a good thing at the same time she recognizes it is not giving her some of the joy that she hoped.  Patient is going to continue working to lose weight and see where things go.  Interventions: Solution-Oriented/Positive Psychology and Insight-Oriented  Diagnosis:   ICD-10-CM   1. PTSD (post-traumatic stress disorder)  F43.10  Plan: Patient is to use coping skills to decrease triggered responses.  Patient is to give her friend and the invitation to talk when she is ready and leave that they are at this time.  Patient is to continue setting limits with her daughter and ex-husband.  Patient is to get back to exercising regularly.  Patient is to continue trying to focus on the positive things that are happening in her life.  Patient is to use self spotting as needed to decrease anxiety and  rumination.  Patient is to work on diaphragmatic breathing safe place exercise and stretching/yoga throughout the day.  Patient is to focus on what she can control fix and change.  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  Patient is to allow processing to continue between sessions .  Patient is to continue working on limit setting with daughter and ex-husband.  Patient is to release negative emotions through exercise    Marlise Simpers, LCMHC

## 2024-05-19 ENCOUNTER — Other Ambulatory Visit (HOSPITAL_COMMUNITY): Payer: Self-pay

## 2024-05-20 DIAGNOSIS — G43E19 Chronic migraine with aura, intractable, without status migrainosus: Secondary | ICD-10-CM | POA: Diagnosis not present

## 2024-05-21 ENCOUNTER — Ambulatory Visit (INDEPENDENT_AMBULATORY_CARE_PROVIDER_SITE_OTHER): Admitting: Psychiatry

## 2024-05-21 ENCOUNTER — Other Ambulatory Visit (HOSPITAL_COMMUNITY): Payer: Self-pay

## 2024-05-21 DIAGNOSIS — F4310 Post-traumatic stress disorder, unspecified: Secondary | ICD-10-CM | POA: Diagnosis not present

## 2024-05-21 NOTE — Progress Notes (Unsigned)
      [facility] Counselor/Therapist Progress Note  Patient ID: Barbara Wood, MRN: [id number],    Date: 05/21/2024  Time Spent: 47 minutes start time 3:03 PM end time 3:50 PM  Treatment Type: Individual Therapy  Reported Symptoms: triggered responses, anxiety, sadness  Mental Status Exam:  Appearance:   Well Groomed     Behavior:  Appropriate  Motor:  Normal  Speech/Language:   Normal Rate  Affect:  Appropriate  Mood:  anxious  Thought process:  normal  Thought content:    WNL  Sensory/Perceptual disturbances:    WNL  Orientation:  oriented to person, place, time/date, and situation  Attention:  Good  Concentration:  Good  Memory:  WNL  Fund of knowledge:   Good  Insight:    Good  Judgment:   Good  Impulse Control:  Good   Risk Assessment: Danger to Self:  No Self-injurious Behavior: No Danger to Others: No Duty to Warn:no Physical Aggression / Violence:No  Access to Firearms a concern: No  Gang Involvement:No   Subjective: Patient was present for session. She shared that it had been a bad week and emotionally draining. She shared her daughter was difficult at her PCP appointment. Things are better with her friend because she broke up with her boyfriend.  Patient went on to share what happened with her daughter.  She also shared that when she had a migraine her daughter brought her some medicine and water to try and make her feel better.  Patient was encouraged to recognize the huge progress that was what she meant that she was doing something right with her daughter for her to have insight.  Also discussed the fact that patient's ex continues to live with the other lady and continue to not follow through on parenting as they had agreed.  Patient was encouraged to continue working with her attorney on what may be the best option.  Patient is getting her CME completed but has more to do by a certain deadline.  Discussed the plans she had to make sure that happened.  Patient  was able to see that she is making progress and does have plans to do things in a positive direction.  Interventions: Insight-Oriented  Diagnosis:   ICD-10-CM   1. PTSD (post-traumatic stress disorder)  F43.10       Plan:  Patient is to use coping skills to decrease triggered responses.  Patient is to continue setting limits with her daughter and ex-husband.  Patient is to get back to exercising regularly.  Patient is to continue trying to focus on the positive things that are happening in her life.  Patient is to use self spotting as needed to decrease anxiety and rumination.  Patient is to work on diaphragmatic breathing safe place exercise and stretching/yoga throughout the day.  Patient is to focus on what she can control fix and change.  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      Marlise Simpers, [facility]

## 2024-05-26 ENCOUNTER — Other Ambulatory Visit (HOSPITAL_COMMUNITY): Payer: Self-pay

## 2024-06-04 ENCOUNTER — Ambulatory Visit: Admitting: Sports Medicine

## 2024-06-04 VITALS — BP 110/82 | HR 65 | Ht 67.0 in | Wt 169.0 lb

## 2024-06-04 DIAGNOSIS — G8929 Other chronic pain: Secondary | ICD-10-CM

## 2024-06-04 DIAGNOSIS — M546 Pain in thoracic spine: Secondary | ICD-10-CM

## 2024-06-04 DIAGNOSIS — M9901 Segmental and somatic dysfunction of cervical region: Secondary | ICD-10-CM

## 2024-06-04 DIAGNOSIS — M9908 Segmental and somatic dysfunction of rib cage: Secondary | ICD-10-CM | POA: Diagnosis not present

## 2024-06-04 DIAGNOSIS — M9902 Segmental and somatic dysfunction of thoracic region: Secondary | ICD-10-CM

## 2024-06-04 DIAGNOSIS — M9905 Segmental and somatic dysfunction of pelvic region: Secondary | ICD-10-CM

## 2024-06-04 DIAGNOSIS — M9903 Segmental and somatic dysfunction of lumbar region: Secondary | ICD-10-CM

## 2024-06-04 NOTE — Progress Notes (Signed)
 [facility] [address] [place] [address] Phone: [phone]   Assessment and Plan:     1. Chronic bilateral thoracic back pain 2. Somatic dysfunction of thoracic region 3. Somatic dysfunction of lumbar region 4. Somatic dysfunction of cervical region 5. Somatic dysfunction of pelvic region 6. Somatic dysfunction of rib region  -Chronic with exacerbation, subsequent visit - Recurrence of musculoskeletal pain, primarily through thoracic spine - Patient has received relief with OMT in the past.  Elects for repeat OMT today.  Tolerated well per note below. - Decision today to treat with OMT was based on Physical Exam   After verbal consent patient was treated with HVLA (high velocity low amplitude), ME (muscle energy), FPR (flex positional release), ST (soft tissue), PC/PD (Pelvic Compression/ Pelvic Decompression) techniques in cervical, rib, thoracic, lumbar, and pelvic areas. Patient tolerated the procedure well with improvement in symptoms.  Patient educated on potential side effects of soreness and recommended to rest, hydrate, and use Tylenol  as needed for pain control.   Pertinent previous records reviewed include none  Follow Up: Patient has follow-up scheduled with Dr. Felipe Wood next week.  Recommend keeping this appointment and could consider repeat OMT   Subjective:   I, Barbara Wood, am serving as a Neurosurgeon for Barbara Wood  Chief Complaint: OMT  HPI:  03/04/2024 Barbara Wood is a 54 y.o. female coming in with complaint of back and neck pain. OMT 01/16/2024. Patient states needs you to take a look at hip. This hasn't been a great week for the hip.   06/04/24 Patient states thoracic tightness   Relevant Historical Information:  migraines, hx of hypermobility   Additional pertinent review of systems negative.  Current Outpatient Medications  Medication Sig Dispense Refill   albuterol  (PROAIR  HFA) 108 (90 Base)  MCG/ACT inhaler Inhale 2 puffs into the lungs every 6 (six) hours as needed for wheezing or shortness of breath. 6.7 g 2   albuterol  (VENTOLIN  HFA) 108 (90 Base) MCG/ACT inhaler Inhale 2 puffs into the lungs every 6 (six) hours as needed for wheezing or shortness of breath 6.7 g 1   ARIPiprazole  (ABILIFY ) 10 MG tablet Take 1 tablet (10 mg total) by mouth daily. 90 tablet 0   atenolol  (TENORMIN ) 25 MG tablet Take 2 tablets (50 mg total) by mouth daily. 180 tablet 1   Baclofen  5 MG TABS Take 1-2 tablets (5-10 mg total) by mouth 3 (three) times daily as needed. 180 tablet 3   betamethasone  dipropionate 0.05 % cream Apply topically 2 (two) times daily. 45 g 5   betamethasone  dipropionate 0.05 % lotion Apply 1 application topically to the scalp daily. Taper use as able. 120 mL 3   Budesonide  (RHINOCORT  ALLERGY NA) Place 1 spray into both nostrils 2 (two) times daily.     budesonide -formoterol  (SYMBICORT ) 160-4.5 MCG/ACT inhaler Inhale 2 puffs into the lungs 2 (two) times daily. 10.2 g 3   cetirizine (ZYRTEC) 10 MG tablet Take 1 tablet by mouth daily.     Cholecalciferol (VITAMIN D3) 250 MCG (10000 UT) capsule Take 10,000 Units by mouth daily.     conjugated estrogens  (PREMARIN ) vaginal cream Place 0.5 g vaginally 3 (three) times a week. 30 g 12   doxycycline  (VIBRA -TABS) 100 MG tablet Take 1 tablet (100 mg total) by mouth 2 (two) times daily. 20 tablet 0   estradiol  (ESTRACE ) 1 MG tablet Take 1 tablet (1 mg total) by mouth daily. 90 tablet 1  HYDROcodone -acetaminophen  (NORCO/VICODIN) 5-325 MG tablet Take 1 tablet by mouth every 6 (six) hours as needed for pain. 30 tablet 0   HYDROcodone -acetaminophen  (NORCO/VICODIN) 5-325 MG tablet Take 1 tablet by mouth every 6 (six) hours as needed for pain 60 tablet 0   HYDROcodone -acetaminophen  (NORCO/VICODIN) 5-325 MG tablet Take 1 tablet by mouth every 6 (six) hours as needed for pain. 60 tablet 0   levothyroxine  (SYNTHROID ) 100 MCG tablet Take 1 tablet (100 mcg  total) by mouth daily.     levothyroxine  (SYNTHROID ) 100 MCG tablet Take 1 tablet (100 mcg total) by mouth daily. 90 tablet 1   lidocaine  (LIDODERM ) 5 % Place 1 patch onto the skin daily. Remove & Discard patch within 12 hours or as directed by MD 30 patch 2   [START ON 06/21/2024] lisdexamfetamine (VYVANSE ) 50 MG capsule Take 1 capsule (50 mg total) by mouth daily. (06/21/24) 30 capsule 0   lisdexamfetamine (VYVANSE ) 50 MG capsule Take 1 capsule (50 mg total) by mouth daily. 30 capsule 0   lisdexamfetamine (VYVANSE ) 50 MG capsule Take 1 capsule (50 mg total) by mouth daily. (05/21/24) 30 capsule 0   meclizine  (ANTIVERT ) 12.5 MG tablet Take 1 tablet (12.5 mg total) by mouth 3 (three) times daily as needed for dizziness. 10 tablet 11   metaxalone  (SKELAXIN ) 800 MG tablet Take 1 tablet (800 mg total) by mouth 3 (three) times daily. 90 tablet 1   montelukast  (SINGULAIR ) 10 MG tablet Take 1 tablet (10 mg total) by mouth at bedtime. 90 tablet 1   NON FORMULARY Vyepti  infusions for migraine     omeprazole  (PRILOSEC) 20 MG capsule Take 1 capsule (20 mg total) by mouth daily. 30 capsule 3   progesterone  (PROMETRIUM ) 100 MG capsule Take 1 capsule (100 mg total) by mouth daily. 90 capsule 4   promethazine  (PHENERGAN ) 25 MG tablet Take 1 tablet (25 mg total) by mouth every 6 (six) hours as needed for nausea or vomiting. 30 tablet 3   Rimegepant Sulfate  ([name]) 75 MG TBDP Take 1 tablet (75 mg total) by mouth daily as needed (migraine). Max dose 1 tablet in 24 hours. 8 tablet 11   sodium chloride  0.9 % SOLN 100 mL with Eptinezumab -jjmr 100 MG/ML SOLN 300 mg Inject 300 mg into the vein every 3 (three) months. 300 mg 4   tirzepatide  (ZEPBOUND ) 10 MG/0.5ML injection vial Inject 10 mg into the skin once a week. 2 mL 2   traMADol  (ULTRAM ) 50 MG tablet Take 2 tablets (100 mg total) by mouth 2 (two) times daily. 360 tablet 0   traZODone  (DESYREL ) 50 MG tablet Take 1-2 tablets (50-100 mg total) by mouth at bedtime as  needed for sleep. 90 tablet 1   triamcinolone  cream (KENALOG ) 0.1 % Apply 1 Application topically 2 (two) times daily. 454 g 2   No current facility-administered medications for this visit.      Objective:     Vitals:   06/04/24 1125  BP: 110/82  Pulse: 65  SpO2: 100%  Weight: 169 lb (76.7 kg)  Height: 5\' 7"  (1.702 m)      Body mass index is 26.47 kg/m.    Physical Exam:     General: Well-appearing, cooperative, sitting comfortably in no acute distress.   OMT Physical Exam:  ASIS Compression Test: Positive left Cervical: TTP paraspinal, C3 RLSR   Rib: Bilateral elevated first rib with TTP, worse on right Thoracic: TTP paraspinal, T3-5 RRSL, T6-8 RLSR Lumbar: TTP paraspinal, L1-3 RLSR Pelvis: Left anterior  innominate  Electronically signed by:  Marshall Skeeter [facility] 12:18 PM 06/04/24

## 2024-06-09 ENCOUNTER — Other Ambulatory Visit (HOSPITAL_COMMUNITY): Payer: Self-pay

## 2024-06-09 ENCOUNTER — Ambulatory Visit: Admitting: Psychiatry

## 2024-06-09 ENCOUNTER — Telehealth: Admitting: Physician Assistant

## 2024-06-09 DIAGNOSIS — B3731 Acute candidiasis of vulva and vagina: Secondary | ICD-10-CM | POA: Diagnosis not present

## 2024-06-09 DIAGNOSIS — F4310 Post-traumatic stress disorder, unspecified: Secondary | ICD-10-CM

## 2024-06-09 DIAGNOSIS — B370 Candidal stomatitis: Secondary | ICD-10-CM | POA: Diagnosis not present

## 2024-06-09 MED ORDER — FLUCONAZOLE 100 MG PO TABS
100.0000 mg | ORAL_TABLET | Freq: Every day | ORAL | 0 refills | Status: AC
  Filled 2024-06-09: qty 6, 6d supply, fill #0

## 2024-06-09 MED ORDER — FLUCONAZOLE 200 MG PO TABS
200.0000 mg | ORAL_TABLET | Freq: Once | ORAL | 0 refills | Status: AC
  Filled 2024-06-09: qty 1, 1d supply, fill #0

## 2024-06-09 MED ORDER — NYSTATIN 100000 UNIT/ML MT SUSP
5.0000 mL | Freq: Four times a day (QID) | OROMUCOSAL | 0 refills | Status: AC
  Filled 2024-06-09: qty 60, 3d supply, fill #0

## 2024-06-09 NOTE — Progress Notes (Unsigned)
 [facility] Counselor/Therapist Progress Note  Patient ID: Barbara Wood, MRN: [id number],    Date: 06/09/2024  Time Spent: 52 minutes start time 4:00 PM end time 4:52 PM Virtual Visit via Video Note Connected with patient by a telemedicine/telehealth application, with their informed consent, and verified patient privacy and that I am speaking with the correct person using two identifiers. I discussed the limitations, risks, security and privacy concerns of performing psychotherapy and the availability of in person appointments. I also discussed with the patient that there may be a patient responsible charge related to this service. The patient expressed understanding and agreed to proceed. I discussed the treatment planning with the patient. The patient was provided an opportunity to ask questions and all were answered. The patient agreed with the plan and demonstrated an understanding of the instructions. The patient was advised to call  our office if  symptoms worsen or feel they are in a crisis state and need immediate contact.   Therapist Location: home Patient Location: home    Treatment Type: Individual Therapy  Reported Symptoms: stomach issues, muscle tension, anxiety  Mental Status Exam:  Appearance:   Well Groomed     Behavior:  Appropriate  Motor:  Normal  Speech/Language:   Normal Rate  Affect:  Appropriate and Tearful  Mood:  anxious and irritable  Thought process:  normal  Thought content:    WNL  Sensory/Perceptual disturbances:    WNL  Orientation:  oriented to person, place, time/date, and situation  Attention:  Good  Concentration:  Good  Memory:  WNL  Fund of knowledge:   Good  Insight:    Good  Judgment:   Good  Impulse Control:  Good   Risk Assessment: Danger to Self:  No Self-injurious Behavior: No Danger to Others: No Duty to Warn:no Physical Aggression / Violence:No  Access to Firearms a concern: No  Gang Involvement:No   Subjective: Met  with patient via virtual session. She shared that work is stressful and she is having to work long hours. She went on to share that her daughter is having issues due to her ex forgetting her and not taking care of his home.  Situations with daughter is feeling overwhelming due to not being able to do anything about the situation. The mess in smaking her feel overwhelmed. Just looking at her house makes her feel powerless. SUDS level 8, I'm powerless I can't fix it felt overwhelmed in herchest and shoulders.  Patient was struggling to reduce suds level.  Through the processing that feeling of being powerless surfaced and there were multiple traumas that surfaced.  Patient was able to recognize that as a child she was able to have adults in her life that were helpful and can be supportive and help her recognize things were not her fault.  She shared currently that that is difficult since her friend and she had the falling out when her friend had a boyfriend and did not contact her.  Patient explained that her friend would be coming in the next couple weeks and they could communicate about that issue.  Encouraged patient to remind herself currently she can manage things as a adult and that she has to continue focusing on the things that she can control fix and change.  Interventions: Solution-Oriented/Positive Psychology, Eye Movement Desensitization and Reprocessing (EMDR), and Insight-Oriented  Diagnosis:   ICD-10-CM   1. PTSD (post-traumatic stress disorder)  F43.10       Plan:  Patient  is to use coping skills to decrease triggered responses.  Patient is to talk to her friend about the issues in the relationship and her need for there to be consistent support.  Patient is to continue setting limits with her daughter and ex-husband.  Patient is to get back to exercising regularly.  Patient is to continue trying to focus on the positive things that are happening in her life.  Patient is to use self  spotting as needed to decrease anxiety and rumination.  Patient is to work on diaphragmatic breathing safe place exercise and stretching/yoga throughout the day.  Patient is to focus on what she can control fix and change.  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    Marlise Simpers, [facility]

## 2024-06-09 NOTE — Progress Notes (Unsigned)
 [facility] [address] [place] [address] Phone: [phone] Subjective:   Barbara Wood, am serving as a scribe for Dr. Ronnell Coins.  I'm seeing this patient by the request  of:  Zilphia Hilt, Charyl Coppersmith, MD  CC: back and neck pain follow up, shoulder pain follow up   BJY:[id number]  Barbara Wood is a 54 y.o. female coming in with complaint of back and neck pain. OMT on 04/22/2024. Patient states that she has had a migraine 6 out of the past 7 days. T4 feels caught. Tspine pain was worse after last manipulation. Has tried yoga wheel. Took Skelaxin  today 1.5 hours ago.   Glute medius pain and GT bursitis has been manageable. Shoulder pain has also improved. Started estrogen supplementation 4-6 months ago and feels like this is helping Gt pain.           Reviewed prior external information including notes and imaging from previsou exam, outside providers and external EMR if available.   As well as notes that were available from care everywhere and other healthcare systems.  Past medical history, social, surgical and family history all reviewed in electronic medical record.  No pertanent information unless stated regarding to the chief complaint.   Past Medical History:  Diagnosis Date   Allergy Age 31   Arthritis    shoulders, rt hip   Asthma Since age 55   Depression Age 60   Family history of adverse reaction to anesthesia    mother and sister   GERD (gastroesophageal reflux disease)    Hashimoto's disease    Heart murmur Mitral valve prolapse age 53   Migraine    PONV (postoperative nausea and vomiting)    Pre-diabetes    Psoriasis    Thyroid  disease Since age 89    Allergies  Allergen Reactions   Amoxicillin-Pot Clavulanate Nausea And Vomiting    Augmentin causes nausea and vomiting   Sertraline Other (See Comments)    hallucinations    Sulfamethoxazole-Trimethoprim Hives   Triptans Other (See Comments)    Chest  pain      Review of Systems:  No headache, visual changes, nausea, vomiting, diarrhea, constipation, dizziness, abdominal pain, skin rash, fevers, chills, night sweats, weight loss, swollen lymph nodes, body aches, joint swelling, chest pain, shortness of breath, mood changes. POSITIVE muscle aches  Objective  Blood pressure 108/78, pulse 76, height 5' 7 (1.702 m), weight 168 lb (76.2 kg), last menstrual period 10/31/2019, SpO2 100%.   General: No apparent distress alert and oriented x3 mood and affect normal, dressed appropriately.  HEENT: Pupils equal, extraocular movements intact  Respiratory: Patient's speak in full sentences and does not appear short of breath  Cardiovascular: No lower extremity edema, non tender, no erythema  MSK:  Back Loss of lordosis noted, tightness faber negative SLT   Osteopathic findings  C2 flexed rotated and side bent right C6 flexed rotated and side bent left T3 extended rotated and side bent right inhaled rib T9 extended rotated and side bent left L2 flexed rotated and side bent right L3 F RS right  Sacrum right on right    Assessment and Plan:  Trigger point of left shoulder region Discussed which activities to do and which ones to avoid.  Increase activity noted  Could contribute headache   Degenerative disc disease, cervical DDD discussed which activities to do and which ones to avoid.  Could contribute to hedaches.  Patient has significant tightness and headaches  with Toradol  and Depo-Medrol  injection given today as well.    Nonallopathic problems  Decision today to treat with OMT was based on Physical Exam  After verbal consent patient was treated with HVLA, ME, FPR techniques in cervical, rib, thoracic, lumbar, and sacral  areas, No HVLA on cervical   Patient tolerated the procedure well with improvement in symptoms  Patient given exercises, stretches and lifestyle modifications  See medications in patient instructions if  given  Patient will follow up in 4-8 weeks     The above documentation has been reviewed and is accurate and complete Barbara Nouri M Arnav Cregg, DO         Note: This dictation was prepared with Dragon dictation along with smaller phrase technology. Any transcriptional errors that result from this process are unintentional.

## 2024-06-09 NOTE — Progress Notes (Signed)
 E-Visit for Vaginal Symptoms  We are sorry that you are not feeling well. Here is how we plan to help! Based on what you shared with me it looks like you: May have a yeast vaginosis  Vaginosis is an inflammation of the vagina that can result in discharge, itching and pain. The cause is usually a change in the normal balance of vaginal bacteria or an infection. Vaginosis can also result from reduced estrogen levels after menopause.  The most common causes of vaginosis are:   Bacterial vaginosis which results from an overgrowth of one on several organisms that are normally present in your vagina.   Yeast infections which are caused by a naturally occurring fungus called candida.   Vaginal atrophy (atrophic vaginosis) which results from the thinning of the vagina from reduced estrogen levels after menopause.   Trichomoniasis which is caused by a parasite and is commonly transmitted by sexual intercourse.  Factors that increase your risk of developing vaginosis include: Medications, such as antibiotics and steroids Uncontrolled diabetes Use of hygiene products such as bubble bath, vaginal spray or vaginal deodorant Douching Wearing damp or tight-fitting clothing Using an intrauterine device (IUD) for birth control Hormonal changes, such as those associated with pregnancy, birth control pills or menopause Sexual activity Having a sexually transmitted infection  Your treatment plan is Diflucan  200 mg once, followed by a course of 100 mg daily due to concomitant oral thrush. I have also sent in a nystatin  solution for you to use as directed.  Aaron Aas   HOME CARE:  Good hygiene may prevent some types of vaginosis from recurring and may relieve some symptoms:  Avoid baths, hot tubs and whirlpool spas. Rinse soap from your outer genital area after a shower, and dry the area well to prevent irritation. Don't use scented or harsh soaps, such as those with deodorant or antibacterial action. Avoid  irritants. These include scented tampons and pads. Wipe from front to back after using the toilet. Doing so avoids spreading fecal bacteria to your vagina.  Other things that may help prevent vaginosis include:  Don't douche. Your vagina doesn't require cleansing other than normal bathing. Repetitive douching disrupts the normal organisms that reside in the vagina and can actually increase your risk of vaginal infection. Douching won't clear up a vaginal infection. Use a latex condom. Both female and female latex condoms may help you avoid infections spread by sexual contact. Wear cotton underwear. Also wear pantyhose with a cotton crotch. If you feel comfortable without it, skip wearing underwear to bed. Yeast thrives in [employer] Your symptoms should improve in the next day or two.  GET HELP RIGHT AWAY IF:  You have pain in your lower abdomen ( pelvic area or over your ovaries) You develop nausea or vomiting You develop a fever Your discharge changes or worsens You have persistent pain with intercourse You develop shortness of breath, a rapid pulse, or you faint.  These symptoms could be signs of problems or infections that need to be evaluated by a medical provider now.  MAKE SURE YOU   Understand these instructions. Will watch your condition. Will get help right away if you are not doing well or get worse.  Thank you for choosing an e-visit.  Your e-visit answers were reviewed by a board certified advanced clinical practitioner to complete your personal care plan. Depending upon the condition, your plan could have included both over the counter or prescription medications.  Please review your pharmacy choice. Make sure  the pharmacy is open so you can pick up prescription now. If there is a problem, you may contact your provider through [employer] and have the prescription routed to another pharmacy.  Your safety is important to us . If you have drug allergies check  your prescription carefully.   For the next 24 hours you can use MyChart to ask questions about today's visit, request a non-urgent call back, or ask for a work or school excuse. You will get an email in the next two days asking about your experience. I hope that your e-visit has been valuable and will speed your recovery.

## 2024-06-09 NOTE — Progress Notes (Signed)
 I have spent 5 minutes in review of e-visit questionnaire, review and updating patient chart, medical decision making and response to patient.   Piedad Climes, PA-C

## 2024-06-10 ENCOUNTER — Other Ambulatory Visit (HOSPITAL_COMMUNITY): Payer: Self-pay

## 2024-06-10 ENCOUNTER — Encounter: Payer: Self-pay | Admitting: Family Medicine

## 2024-06-10 ENCOUNTER — Ambulatory Visit: Admitting: Family Medicine

## 2024-06-10 VITALS — BP 108/78 | HR 76 | Ht 67.0 in | Wt 168.0 lb

## 2024-06-10 DIAGNOSIS — M546 Pain in thoracic spine: Secondary | ICD-10-CM | POA: Diagnosis not present

## 2024-06-10 DIAGNOSIS — M9901 Segmental and somatic dysfunction of cervical region: Secondary | ICD-10-CM

## 2024-06-10 DIAGNOSIS — M9903 Segmental and somatic dysfunction of lumbar region: Secondary | ICD-10-CM

## 2024-06-10 DIAGNOSIS — M9902 Segmental and somatic dysfunction of thoracic region: Secondary | ICD-10-CM

## 2024-06-10 DIAGNOSIS — M9908 Segmental and somatic dysfunction of rib cage: Secondary | ICD-10-CM | POA: Diagnosis not present

## 2024-06-10 DIAGNOSIS — M25512 Pain in left shoulder: Secondary | ICD-10-CM

## 2024-06-10 DIAGNOSIS — G8929 Other chronic pain: Secondary | ICD-10-CM | POA: Diagnosis not present

## 2024-06-10 DIAGNOSIS — M5030 Other cervical disc degeneration, unspecified cervical region: Secondary | ICD-10-CM | POA: Diagnosis not present

## 2024-06-10 DIAGNOSIS — M9905 Segmental and somatic dysfunction of pelvic region: Secondary | ICD-10-CM

## 2024-06-10 MED ORDER — KETOROLAC TROMETHAMINE 60 MG/2ML IM SOLN
60.0000 mg | Freq: Once | INTRAMUSCULAR | Status: AC
  Administered 2024-06-10: 60 mg via INTRAMUSCULAR

## 2024-06-10 MED ORDER — METHYLPREDNISOLONE ACETATE 40 MG/ML IJ SUSP
40.0000 mg | Freq: Once | INTRAMUSCULAR | Status: AC
  Administered 2024-06-10: 40 mg via INTRAMUSCULAR

## 2024-06-10 NOTE — Assessment & Plan Note (Signed)
 DDD discussed which activities to do and which ones to avoid.  Could contribute to hedaches.  Patient has significant tightness and headaches with Toradol  and Depo-Medrol  injection given today as well.

## 2024-06-10 NOTE — Patient Instructions (Addendum)
 Trigger point injections See me in 5-6 weeks

## 2024-06-10 NOTE — Assessment & Plan Note (Signed)
 Discussed which activities to do and which ones to avoid.  Increase activity noted  Could contribute headache

## 2024-06-22 DIAGNOSIS — H5213 Myopia, bilateral: Secondary | ICD-10-CM | POA: Diagnosis not present

## 2024-06-23 ENCOUNTER — Ambulatory Visit: Admitting: Psychiatry

## 2024-06-23 ENCOUNTER — Other Ambulatory Visit (HOSPITAL_COMMUNITY): Payer: Self-pay

## 2024-06-23 ENCOUNTER — Other Ambulatory Visit: Payer: Self-pay | Admitting: Internal Medicine

## 2024-06-23 DIAGNOSIS — G43909 Migraine, unspecified, not intractable, without status migrainosus: Secondary | ICD-10-CM

## 2024-06-23 DIAGNOSIS — F4310 Post-traumatic stress disorder, unspecified: Secondary | ICD-10-CM | POA: Diagnosis not present

## 2024-06-23 DIAGNOSIS — M7918 Myalgia, other site: Secondary | ICD-10-CM

## 2024-06-23 MED ORDER — TRAMADOL HCL 50 MG PO TABS
100.0000 mg | ORAL_TABLET | Freq: Two times a day (BID) | ORAL | 0 refills | Status: DC
  Filled 2024-06-23: qty 360, 90d supply, fill #0

## 2024-06-23 NOTE — Progress Notes (Addendum)
 [facility] Counselor/Therapist Progress Note  Patient ID: Kynnedy Carreno, MRN: [id number],    Date: 06/23/2024  Time Spent: 48 minutes start time 3:57 PM end time 4:45 PM Virtual Visit via Video Note Connected with patient by a telemedicine/telehealth application, with their informed consent, and verified patient privacy and that I am speaking with the correct person using two identifiers. I discussed the limitations, risks, security and privacy concerns of performing psychotherapy and the availability of in person appointments. I also discussed with the patient that there may be a patient responsible charge related to this service. The patient expressed understanding and agreed to proceed. I discussed the treatment planning with the patient. The patient was provided an opportunity to ask questions and all were answered. The patient agreed with the plan and demonstrated an understanding of the instructions. The patient was advised to call  our office if  symptoms worsen or feel they are in a crisis state and need immediate contact.   Therapist Location: home Patient Location: home    Treatment Type: Individual Therapy  Reported Symptoms: anxiety, triggered responses  Mental Status Exam:  Appearance:   Casual     Behavior:  Appropriate  Motor:  Normal  Speech/Language:   Normal Rate  Affect:  Appropriate  Mood:  normal  Thought process:  normal  Thought content:    WNL  Sensory/Perceptual disturbances:    WNL  Orientation:  oriented to person, place, time/date, and situation  Attention:  Good  Concentration:  Good  Memory:  WNL  Fund of knowledge:   Good  Insight:    Good  Judgment:   Good  Impulse Control:  Good   Risk Assessment: Danger to Self:  No Self-injurious Behavior: No Danger to Others: No Duty to Warn:no Physical Aggression / Violence:No  Access to Firearms a concern: No  Gang Involvement:No   Subjective: Met with patient via virtual session. She shared  she had a good time on her trip. She shared that the weather was good. Her friend is there with her. She processed what she needs to tell her concerning the situation with her boyfriend. Patient was able to discuss memory from childhood that the situation had triggered. Encouraged her to share that with her friend and to remind her friend and herself that it it more about being able to have a healthy relationship and make sure that they are taking care of each other.  Patient reported feeling good about plan developed in session. She shared that her daughter has continued to have some behavior challenges. Patient is able to stay calmer as she addresses them and is working on reminding herself of positive things with her.  [name] is still not following through on things with daughter which is hard for both of them. Patient is going to just support daughter through the situation. She has not completed her CMEs and the projects yet but feels motivated to get the completed and will be working on them after session.   Interventions: Assertiveness/Communication and Solution-Oriented/Positive Psychology  Diagnosis:   ICD-10-CM   1. PTSD (post-traumatic stress disorder)  F43.10       Plan: Patient is to use coping skills to decrease triggered responses.  Patient is to talk to her friend about the issues in the relationship and her need for there to be consistent support.  Patient is to continue setting limits with her daughter and ex-husband.  Patient is to get back to exercising regularly.  Patient  is to continue trying to focus on the positive things that are happening in her life.  Patient is to use self spotting as needed to decrease anxiety and rumination.  Patient is to work on diaphragmatic breathing safe place exercise and stretching/yoga throughout the day.  Patient is to focus on what she can control fix and change.  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      Silvano Pacini, [facility]

## 2024-06-24 ENCOUNTER — Other Ambulatory Visit (HOSPITAL_COMMUNITY): Payer: Self-pay

## 2024-06-24 ENCOUNTER — Other Ambulatory Visit: Payer: Self-pay

## 2024-06-25 ENCOUNTER — Other Ambulatory Visit (HOSPITAL_COMMUNITY): Payer: Self-pay

## 2024-06-29 ENCOUNTER — Other Ambulatory Visit: Payer: Self-pay | Admitting: Internal Medicine

## 2024-06-29 DIAGNOSIS — Z0000 Encounter for general adult medical examination without abnormal findings: Secondary | ICD-10-CM

## 2024-06-30 ENCOUNTER — Other Ambulatory Visit (HOSPITAL_COMMUNITY): Payer: Self-pay

## 2024-07-05 ENCOUNTER — Other Ambulatory Visit: Payer: Self-pay | Admitting: Internal Medicine

## 2024-07-05 DIAGNOSIS — G4700 Insomnia, unspecified: Secondary | ICD-10-CM

## 2024-07-06 ENCOUNTER — Other Ambulatory Visit: Payer: Self-pay

## 2024-07-06 ENCOUNTER — Encounter (HOSPITAL_BASED_OUTPATIENT_CLINIC_OR_DEPARTMENT_OTHER): Payer: Self-pay

## 2024-07-06 DIAGNOSIS — Z5321 Procedure and treatment not carried out due to patient leaving prior to being seen by health care provider: Secondary | ICD-10-CM | POA: Diagnosis not present

## 2024-07-06 DIAGNOSIS — R519 Headache, unspecified: Secondary | ICD-10-CM | POA: Diagnosis not present

## 2024-07-06 NOTE — ED Triage Notes (Signed)
 Pt presents via POV c/o headache. Reports hx of migraines. Reports took meds as prescribed for migraines. Reports occipital headache.

## 2024-07-07 ENCOUNTER — Emergency Department (HOSPITAL_BASED_OUTPATIENT_CLINIC_OR_DEPARTMENT_OTHER)
Admission: EM | Admit: 2024-07-07 | Discharge: 2024-07-07 | Discharge status: Against Medical Advice | Attending: Emergency Medicine | Admitting: Emergency Medicine

## 2024-07-07 ENCOUNTER — Other Ambulatory Visit (HOSPITAL_COMMUNITY): Payer: Self-pay

## 2024-07-07 ENCOUNTER — Telehealth: Payer: Self-pay

## 2024-07-07 MED ORDER — TRAZODONE HCL 50 MG PO TABS
50.0000 mg | ORAL_TABLET | Freq: Every evening | ORAL | 1 refills | Status: DC | PRN
Start: 2024-07-07 — End: 2024-09-07
  Filled 2024-07-07: qty 90, 45d supply, fill #0
  Filled 2024-08-19: qty 90, 45d supply, fill #1

## 2024-07-07 MED ORDER — ARIPIPRAZOLE 10 MG PO TABS
10.0000 mg | ORAL_TABLET | Freq: Every day | ORAL | 0 refills | Status: DC
  Filled 2024-07-07: qty 90, 90d supply, fill #0

## 2024-07-07 NOTE — Telephone Encounter (Signed)
 I can't make assessments over a phone message unfortunately, patient needs to be seen and examined. If the migraine is severe I do recommend waiting to be seen in the emergency room. Or do any of our NPs have an opening?

## 2024-07-07 NOTE — Telephone Encounter (Signed)
 Patient called in to advise that she had a 10/10 migraine yesterday. States it was the migraine of her life and that this level of migraine has only happened one other time. She stated that normally her migraines are in the front of her head but this one was in the back. She could not lay down or put any pressure on her head at all. If she did touch her head, the pain lingered 30+ minutes. She took [name], Hydrocodone , Fenergan and a muscle relaxant - trying to alleviate the migraine but nothing worked.  She stated she threw up three times due to the migraines. She had no vision changes - did have mild aphasia which she normally has with her migraines. No numbness or tingling other than being unable to touch her head due to pain.  She went to the ED as she was concerned for other neurological causes of the migraine. She stated when she got there, there were 15 patients waiting. She ended up leaving as all patients were called back before here and she did not feel like they were taking her situation seriously. At that point, her headache was 4/10 so she left.   States that today her headache is 3/10 - she does feel hung over but she was able to go into work.  She is curious as to whether she needs any type of imaging to make sure no other neurological process is going on.   Please advise, thanks!SABRA

## 2024-07-07 NOTE — Telephone Encounter (Signed)
 I see pt has been scheduled with Lauraine NP.

## 2024-07-08 ENCOUNTER — Other Ambulatory Visit: Payer: Self-pay | Admitting: Neurology

## 2024-07-08 ENCOUNTER — Encounter: Payer: Self-pay | Admitting: Neurology

## 2024-07-08 ENCOUNTER — Ambulatory Visit: Admitting: Psychiatry

## 2024-07-08 ENCOUNTER — Ambulatory Visit (INDEPENDENT_AMBULATORY_CARE_PROVIDER_SITE_OTHER)

## 2024-07-08 DIAGNOSIS — R519 Headache, unspecified: Secondary | ICD-10-CM | POA: Diagnosis not present

## 2024-07-08 DIAGNOSIS — F4310 Post-traumatic stress disorder, unspecified: Secondary | ICD-10-CM

## 2024-07-08 MED ORDER — GADOBENATE DIMEGLUMINE 529 MG/ML IV SOLN
15.0000 mL | Freq: Once | INTRAVENOUS | Status: AC | PRN
  Administered 2024-07-08: 15 mL via INTRAVENOUS

## 2024-07-08 NOTE — Telephone Encounter (Signed)
 I spoke with Barbara Wood and she would like to go ahead and get scheduled for an appointment for an MRI today at 2:00 pm. Spoke with [name] and she is aware, just waiting for the order to be placed.

## 2024-07-08 NOTE — Progress Notes (Signed)
      [facility] Counselor/Therapist Progress Note  Patient ID: Barbara Wood, MRN: [id number],    Date: 07/08/2024  Time Spent: 34 minutes Start time 1:05 PM end time 1:39 PM  Treatment Type: Individual Therapy  Reported Symptoms: anxiety, migraines, triggered responses  Mental Status Exam:  Appearance:   Well Groomed     Behavior:  Appropriate  Motor:  Normal  Speech/Language:   Normal Rate  Affect:  Appropriate  Mood:  anxious  Thought process:  normal  Thought content:    WNL  Sensory/Perceptual disturbances:    WNL  Orientation:  oriented to person, place, time/date, and situation  Attention:  Good  Concentration:  Good  Memory:  WNL  Fund of knowledge:   Good  Insight:    Good  Judgment:   Good  Impulse Control:  Good   Risk Assessment: Danger to Self:  No Self-injurious Behavior: No Danger to Others: No Duty to Warn:no Physical Aggression / Violence:No  Access to Firearms a concern: No  Gang Involvement:No   Subjective: Patient was present for session. She shared that she had the worst headache of her life she went to the ED and was ignored so she had to leave. She is going have a brain MRI after session.   Patient would have to leave the session early due to that appointment.  She shared that she had followed through on last sessions plans and talk to her best friend about how she was feeling and she felt that it went very well.  She shared that both of them felt more connected and supported which was a positive thing.  Patient went on to share she is continuing to struggle with her daughter who is having difficulty being respectful and doing her chores.  Encouraged her to continue taking things 1 step at a time and allowing her daughter to have natural consequences as much as possible for her choices and behaviors.  Interventions: Solution-Oriented/Positive Psychology and Humanistic/Existential  Diagnosis:   ICD-10-CM   1. PTSD (post-traumatic stress disorder)   F43.10       Plan:  Patient is to use coping skills to decrease triggered responses.   Patient is to continue setting limits with her daughter and ex-husband.  Patient is to get back to exercising regularly.  Patient is to continue trying to focus on the positive things that are happening in her life.  Patient is to use self spotting as needed to decrease anxiety and rumination.  Patient is to work on diaphragmatic breathing safe place exercise and stretching/yoga throughout the day.  Patient is to focus on what she can control fix and change.  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    Silvano Pacini, [facility]
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
## 2024-07-09 ENCOUNTER — Ambulatory Visit: Payer: Self-pay | Admitting: Neurology

## 2024-07-09 ENCOUNTER — Encounter: Payer: Self-pay | Admitting: Neurology

## 2024-07-09 ENCOUNTER — Ambulatory Visit: Admitting: Neurology

## 2024-07-09 VITALS — BP 100/69 | HR 80 | Ht 67.0 in | Wt 162.5 lb

## 2024-07-09 DIAGNOSIS — G43909 Migraine, unspecified, not intractable, without status migrainosus: Secondary | ICD-10-CM | POA: Diagnosis not present

## 2024-07-09 NOTE — Patient Instructions (Signed)
 Nice to meet you today! Thankfully MRI of the brain was normal :) Continue current migraine treatment plan Call for any concerns or worsening migraines Keep appointment with Dr. Ines Thanks!!

## 2024-07-15 ENCOUNTER — Ambulatory Visit
Admission: RE | Admit: 2024-07-15 | Discharge: 2024-07-15 | Disposition: A | Discharge status: Home / Self Care | Source: Ambulatory Visit | Attending: Internal Medicine | Admitting: Internal Medicine

## 2024-07-15 DIAGNOSIS — Z0000 Encounter for general adult medical examination without abnormal findings: Secondary | ICD-10-CM

## 2024-07-15 DIAGNOSIS — Z1231 Encounter for screening mammogram for malignant neoplasm of breast: Secondary | ICD-10-CM | POA: Diagnosis not present

## 2024-07-20 ENCOUNTER — Other Ambulatory Visit: Payer: Self-pay | Admitting: Internal Medicine

## 2024-07-20 ENCOUNTER — Other Ambulatory Visit: Payer: Self-pay

## 2024-07-20 ENCOUNTER — Other Ambulatory Visit (HOSPITAL_COMMUNITY): Payer: Self-pay

## 2024-07-20 DIAGNOSIS — F900 Attention-deficit hyperactivity disorder, predominantly inattentive type: Secondary | ICD-10-CM

## 2024-07-20 DIAGNOSIS — M7918 Myalgia, other site: Secondary | ICD-10-CM

## 2024-07-20 DIAGNOSIS — J4540 Moderate persistent asthma, uncomplicated: Secondary | ICD-10-CM

## 2024-07-20 MED ORDER — BUDESONIDE-FORMOTEROL FUMARATE 160-4.5 MCG/ACT IN AERO
2.0000 | INHALATION_SPRAY | Freq: Two times a day (BID) | RESPIRATORY_TRACT | 3 refills | Status: DC
  Filled 2024-07-20: qty 10.2, 30d supply, fill #0
  Filled 2024-09-07: qty 10.2, 30d supply, fill #1
  Filled 2024-10-05: qty 10.2, 30d supply, fill #2

## 2024-07-20 MED ORDER — MONTELUKAST SODIUM 10 MG PO TABS
10.0000 mg | ORAL_TABLET | Freq: Every day | ORAL | 1 refills | Status: DC
  Filled 2024-07-20: qty 90, 90d supply, fill #0
  Filled 2024-10-14: qty 90, 90d supply, fill #1

## 2024-07-20 MED ORDER — METAXALONE 800 MG PO TABS
800.0000 mg | ORAL_TABLET | Freq: Three times a day (TID) | ORAL | 1 refills | Status: DC
Start: 2024-07-20 — End: 2024-11-22
  Filled 2024-07-20: qty 90, 30d supply, fill #0
  Filled 2024-10-05: qty 90, 30d supply, fill #1

## 2024-07-20 MED ORDER — LISDEXAMFETAMINE DIMESYLATE 50 MG PO CAPS
50.0000 mg | ORAL_CAPSULE | Freq: Every day | ORAL | 0 refills | Status: DC
  Filled 2024-07-20 – 2024-07-22 (×3): qty 30, 30d supply, fill #0

## 2024-07-20 MED ORDER — ARIPIPRAZOLE 10 MG PO TABS
10.0000 mg | ORAL_TABLET | Freq: Every day | ORAL | 0 refills | Status: DC
  Filled 2024-07-20 – 2024-10-05 (×2): qty 90, 90d supply, fill #0

## 2024-07-21 ENCOUNTER — Other Ambulatory Visit (HOSPITAL_COMMUNITY): Payer: Self-pay

## 2024-07-22 ENCOUNTER — Ambulatory Visit (INDEPENDENT_AMBULATORY_CARE_PROVIDER_SITE_OTHER): Admitting: Psychiatry

## 2024-07-22 ENCOUNTER — Other Ambulatory Visit (HOSPITAL_COMMUNITY): Payer: Self-pay

## 2024-07-22 DIAGNOSIS — F4310 Post-traumatic stress disorder, unspecified: Secondary | ICD-10-CM

## 2024-07-22 NOTE — Progress Notes (Unsigned)
      [facility] Counselor/Therapist Progress Note  Patient ID: Barbara Wood, MRN: [id number],    Date: 07/22/2024  Time Spent: 50 minutes start time 1:05 PM end time 1:55 PM  Treatment Type: Individual Therapy  Reported Symptoms:   anxiety, triggered responses, health issues  Mental Status Exam:  Appearance:   Well Groomed     Behavior:  Appropriate  Motor:  Normal  Speech/Language:   Normal Rate  Affect:  Appropriate  Mood:  anxious  Thought process:  normal  Thought content:    WNL  Sensory/Perceptual disturbances:    WNL  Orientation:  oriented to person, place, time/date, and situation  Attention:  Good  Concentration:  Good  Memory:  WNL  Fund of knowledge:   Good  Insight:    Good  Judgment:   Good  Impulse Control:  Good   Risk Assessment: Danger to Self:  No Self-injurious Behavior: No Danger to Others: No Duty to Warn:no Physical Aggression / Violence:No  Access to Firearms a concern: No  Gang Involvement:No   Subjective: Patient was present for session. She shared that things have been very busy. She had a bad GI bug.  She went on to share she is concerned about her daughter who is sleeping 14 hours a day.  She shared she is taking her to the position to try and figure out what may be going on with her.  Patient explained that things are going somewhat better with her as far as their relationship they are talking through things more but she is concerned because of the constant fatigue with her daughter she is not accomplishing much during the day.  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  Helped patient think through all that it was going on for her and discussed different plans to get things accomplished so that she was not so triggered and overwhelmed with the situation.  Especially since the fatigue that her daughter is exhibiting  was the same fatigue that she had at that age.  Interventions: Solution-Oriented/Positive Psychology  Diagnosis:   ICD-10-CM   1. PTSD (post-traumatic stress disorder)  F43.10       Plan:  Patient is to use coping skills to decrease triggered responses.  Patient is to work on plans written out in session.  Patient is to continue setting limits with her daughter and ex-husband.  Patient is to get back to exercising regularly.  Patient is to continue trying to focus on the positive things that are happening in her life.  Patient is to use self spotting as needed to decrease anxiety and rumination.  Patient is to work on diaphragmatic breathing safe place exercise and stretching/yoga throughout the day.  Patient is to focus on what she can control fix and change.  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    Silvano Pacini, [facility]

## 2024-07-24 ENCOUNTER — Other Ambulatory Visit: Payer: Self-pay | Admitting: Internal Medicine

## 2024-07-24 DIAGNOSIS — E66811 Other obesity due to excess calories: Secondary | ICD-10-CM

## 2024-07-24 DIAGNOSIS — R7302 Impaired glucose tolerance (oral): Secondary | ICD-10-CM

## 2024-07-28 NOTE — Progress Notes (Unsigned)
 [name] [address] [place] [address] Phone: [phone] Subjective:   Barbara Wood, am serving as a scribe for Dr. Arthea Claudene.  I'm seeing this patient by the request  of:  Theophilus Andrews, Tully GRADE, MD  CC: back and neck pain follow up   YEP:Dlagzrupcz  Barbara Wood is a 54 y.o. female coming in with complaint of back and neck pain. OMT on 06/10/2024. Patient states that she is experiencing more neck tightness since end of June. States that she did some trigger points on neck musculature yesterday but had no relief. Patient notes fatigue and nausea as well since end of June. Headaches do seem to be more frequent almost daily.   Medications patient has been prescribed:   Taking:      ?recheck labs for B12, TSH,    Reviewed prior external information including notes and imaging from previsou exam, outside providers and external EMR if available.   As well as notes that were available from care everywhere and other healthcare systems.  Past medical history, social, surgical and family history all reviewed in electronic medical record.  No pertanent information unless stated regarding to the chief complaint.   Past Medical History:  Diagnosis Date   Allergy Age 53   Arthritis    shoulders, rt hip   Asthma Since age 60   Depression Age 38   Family history of adverse reaction to anesthesia    mother and sister   GERD (gastroesophageal reflux disease)    Hashimoto's disease    Heart murmur Mitral valve prolapse age 86   Migraine    PONV (postoperative nausea and vomiting)    Pre-diabetes    Psoriasis    Thyroid  disease Since age 68    Allergies  Allergen Reactions   Amoxicillin-Pot Clavulanate Nausea And Vomiting    Augmentin causes nausea and vomiting   Sertraline Other (See Comments)    hallucinations    Sulfamethoxazole-Trimethoprim Hives   Trimethoprim    Triptans Other (See Comments)    Chest pain       Review of Systems:  No visual changes, nausea, vomiting, diarrhea, constipation, dizziness, abdominal pain, skin rash, fevers, chills, night sweats, weight loss, swollen lymph nodes, , chest pain, shortness of breath, mood changes. POSITIVE muscle aches, dysuria, body aches, joint swelling, headaches  Objective  Blood pressure 102/78, height 5' 7 (1.702 m), weight 158 lb (71.7 kg), last menstrual period 10/31/2019.   General: No apparent distress alert and oriented x3 mood and affect normal, dressed appropriately.  HEENT: Pupils equal, extraocular movements intact  Respiratory: Patient's speak in full sentences and does not appear short of breath  Cardiovascular: No lower extremity edema, non tender, no erythema  Neck exam does have some crepitus noted.  Some limited sidebending bilaterally.  Patient does have some more tightness on the right side.  Multiple different tightness in the right shoulder blade area as well.  Patient just does not seem to be feeling well.  Osteopathic findings  C5 flexed rotated and side bent right T3 extended rotated and side bent right inhaled rib T9 extended rotated and side bent left L2 flexed rotated and side bent right Sacrum right on right     Assessment and Plan:  Neck pain Chronic problem with worsening pain.  Discussed arthritis and home exercises.  He is currently following up with primary care provider will get some laboratory workup to further evaluate.  Discussed regimen.  Discussed posture  and ergonomics.  Patient has multiple different medications.    Nonallopathic problems  Decision today to treat with OMT was based on Physical Exam  After verbal consent patient was treated with HVLA, ME, FPR techniques in cervical, thoracic, lumbar, and sacral  areas avoided any HVLA on the cervical spine.  Patient does have past medical history significant for surgery  Patient tolerated the procedure well with improvement in symptoms  Patient  given exercises, stretches and lifestyle modifications  See medications in patient instructions if given  Patient will follow up in 4-8 weeks     The above documentation has been reviewed and is accurate and complete Barbara Pugh M Cammi Consalvo, DO         Note: This dictation was prepared with Dragon dictation along with smaller phrase technology. Any transcriptional errors that result from this process are unintentional.

## 2024-07-29 ENCOUNTER — Other Ambulatory Visit (HOSPITAL_COMMUNITY): Payer: Self-pay

## 2024-07-29 ENCOUNTER — Ambulatory Visit: Admitting: Family Medicine

## 2024-07-29 VITALS — BP 102/78 | Ht 67.0 in | Wt 158.0 lb

## 2024-07-29 DIAGNOSIS — M9903 Segmental and somatic dysfunction of lumbar region: Secondary | ICD-10-CM

## 2024-07-29 DIAGNOSIS — E039 Hypothyroidism, unspecified: Secondary | ICD-10-CM

## 2024-07-29 DIAGNOSIS — E063 Autoimmune thyroiditis: Secondary | ICD-10-CM

## 2024-07-29 DIAGNOSIS — E538 Deficiency of other specified B group vitamins: Secondary | ICD-10-CM

## 2024-07-29 DIAGNOSIS — M9904 Segmental and somatic dysfunction of sacral region: Secondary | ICD-10-CM

## 2024-07-29 DIAGNOSIS — M542 Cervicalgia: Secondary | ICD-10-CM | POA: Diagnosis not present

## 2024-07-29 DIAGNOSIS — M9902 Segmental and somatic dysfunction of thoracic region: Secondary | ICD-10-CM

## 2024-07-29 DIAGNOSIS — M9901 Segmental and somatic dysfunction of cervical region: Secondary | ICD-10-CM | POA: Diagnosis not present

## 2024-07-29 DIAGNOSIS — R300 Dysuria: Secondary | ICD-10-CM | POA: Diagnosis not present

## 2024-07-29 DIAGNOSIS — M2550 Pain in unspecified joint: Secondary | ICD-10-CM | POA: Diagnosis not present

## 2024-07-29 DIAGNOSIS — R7302 Impaired glucose tolerance (oral): Secondary | ICD-10-CM

## 2024-07-29 DIAGNOSIS — G43909 Migraine, unspecified, not intractable, without status migrainosus: Secondary | ICD-10-CM

## 2024-07-29 LAB — CBC WITH DIFFERENTIAL/PLATELET
Basophils Absolute: 0 K/uL (ref 0.0–0.1)
Basophils Relative: 0.5 % (ref 0.0–3.0)
Eosinophils Absolute: 0.1 K/uL (ref 0.0–0.7)
Eosinophils Relative: 0.7 % (ref 0.0–5.0)
HCT: 40.7 % (ref 36.0–46.0)
Hemoglobin: 13.4 g/dL (ref 12.0–15.0)
Lymphocytes Relative: 22.1 % (ref 12.0–46.0)
Lymphs Abs: 1.6 K/uL (ref 0.7–4.0)
MCHC: 32.9 g/dL (ref 30.0–36.0)
MCV: 88.4 fl (ref 78.0–100.0)
Monocytes Absolute: 0.4 K/uL (ref 0.1–1.0)
Monocytes Relative: 5.5 % (ref 3.0–12.0)
Neutro Abs: 5 K/uL (ref 1.4–7.7)
Neutrophils Relative %: 71.2 % (ref 43.0–77.0)
Platelets: 220 K/uL (ref 150.0–400.0)
RBC: 4.6 Mil/uL (ref 3.87–5.11)
RDW: 13.5 % (ref 11.5–15.5)
WBC: 7.1 K/uL (ref 4.0–10.5)

## 2024-07-29 LAB — COMPREHENSIVE METABOLIC PANEL WITH GFR
ALT: 13 U/L (ref 0–35)
AST: 16 U/L (ref 0–37)
Albumin: 4.4 g/dL (ref 3.5–5.2)
Alkaline Phosphatase: 77 U/L (ref 39–117)
BUN: 8 mg/dL (ref 6–23)
CO2: 28 meq/L (ref 19–32)
Calcium: 9.6 mg/dL (ref 8.4–10.5)
Chloride: 102 meq/L (ref 96–112)
Creatinine, Ser: 0.71 mg/dL (ref 0.40–1.20)
GFR: 98.31 mL/min (ref >60.00)
Glucose, Bld: 94 mg/dL (ref 70–99)
Potassium: 3.5 meq/L (ref 3.5–5.1)
Sodium: 140 meq/L (ref 135–145)
Total Bilirubin: 0.6 mg/dL (ref 0.2–1.2)
Total Protein: 7.2 g/dL (ref 6.0–8.3)

## 2024-07-29 LAB — LIPID PANEL
Cholesterol: 190 mg/dL (ref 0–200)
HDL: 74.5 mg/dL (ref >39.00)
LDL Cholesterol: 97 mg/dL (ref 0–99)
NonHDL: 115.01
Total CHOL/HDL Ratio: 3
Triglycerides: 91 mg/dL (ref 0.0–149.0)
VLDL: 18.2 mg/dL (ref 0.0–40.0)

## 2024-07-29 LAB — URINALYSIS
Bilirubin Urine: NEGATIVE
Hgb urine dipstick: NEGATIVE
Ketones, ur: NEGATIVE
Leukocytes,Ua: NEGATIVE
Nitrite: NEGATIVE
Specific Gravity, Urine: 1.025 (ref 1.000–1.030)
Total Protein, Urine: NEGATIVE
Urine Glucose: NEGATIVE
Urobilinogen, UA: 0.2 (ref 0.0–1.0)
pH: 6 (ref 5.0–8.0)

## 2024-07-29 LAB — IBC PANEL
Iron: 57 ug/dL (ref 42–145)
Saturation Ratios: 15.6 % — ABNORMAL LOW (ref 20.0–50.0)
TIBC: 365.4 ug/dL (ref 250.0–450.0)
Transferrin: 261 mg/dL (ref 212.0–360.0)

## 2024-07-29 LAB — TSH: TSH: 0.07 u[IU]/mL — ABNORMAL LOW (ref 0.35–5.50)

## 2024-07-29 LAB — HEMOGLOBIN A1C: Hgb A1c MFr Bld: 5.8 % (ref 4.6–6.5)

## 2024-07-29 LAB — VITAMIN D 25 HYDROXY (VIT D DEFICIENCY, FRACTURES): VITD: 75.44 ng/mL (ref 30.00–100.00)

## 2024-07-29 LAB — FERRITIN: Ferritin: 16.3 ng/mL (ref 10.0–291.0)

## 2024-07-29 LAB — VITAMIN B12: Vitamin B-12: 232 pg/mL (ref 211–911)

## 2024-07-29 LAB — SEDIMENTATION RATE: Sed Rate: 18 mm/h (ref 0–30)

## 2024-07-29 LAB — T4, FREE: Free T4: 0.9 ng/dL (ref 0.60–1.60)

## 2024-07-29 LAB — T3, FREE: T3, Free: 3.8 pg/mL (ref 2.3–4.2)

## 2024-07-29 MED ORDER — METHYLPREDNISOLONE ACETATE 80 MG/ML IJ SUSP
80.0000 mg | Freq: Once | INTRAMUSCULAR | Status: AC
  Administered 2024-07-29: 80 mg via INTRAMUSCULAR

## 2024-07-29 MED ORDER — CYANOCOBALAMIN 1000 MCG/ML IJ SOLN
1000.0000 ug | Freq: Once | INTRAMUSCULAR | Status: AC
  Administered 2024-07-29: 1000 ug via INTRAMUSCULAR

## 2024-07-29 MED ORDER — CEPHALEXIN 500 MG PO CAPS
500.0000 mg | ORAL_CAPSULE | Freq: Two times a day (BID) | ORAL | 0 refills | Status: DC
  Filled 2024-07-29: qty 14, 7d supply, fill #0

## 2024-07-29 MED ORDER — KETOROLAC TROMETHAMINE 30 MG/ML IJ SOLN
60.0000 mg | Freq: Once | INTRAMUSCULAR | Status: AC
  Administered 2024-07-29: 60 mg via INTRAMUSCULAR

## 2024-07-29 NOTE — Assessment & Plan Note (Addendum)
 Chronic problem with worsening pain.  Discussed arthritis and home exercises.  He is currently following up with primary care provider will get some laboratory workup to further evaluate.  Discussed regimen.  Discussed posture and ergonomics.  Patient has multiple different medications.  Cocktail given today as well.  Given Toradol  and Depo-Medrol .

## 2024-07-29 NOTE — Patient Instructions (Addendum)
 Injections in backside Labs today Keflex  500mg  BID for 7 days B12 today after labs See me again in 8 weeks

## 2024-07-29 NOTE — Assessment & Plan Note (Signed)
Recheck thyroid panel 

## 2024-07-29 NOTE — Assessment & Plan Note (Signed)
 Toradol  and Depo-Medrol  given

## 2024-07-29 NOTE — Assessment & Plan Note (Signed)
 Continues to have dysuria and frequent urination.  Will get labs, following up with primary care so will get annual labs to just make sure nothing else is abnormal.  Having increasing fatigue recently that is also giving patient difficulty.

## 2024-07-30 LAB — UA/M W/RFLX CULTURE, ROUTINE

## 2024-07-31 LAB — PTH, INTACT AND CALCIUM
Calcium: 9.4 mg/dL (ref 8.6–10.4)
PTH: 27 pg/mL (ref 16–77)

## 2024-08-03 ENCOUNTER — Encounter: Payer: Self-pay | Admitting: Internal Medicine

## 2024-08-03 ENCOUNTER — Other Ambulatory Visit (HOSPITAL_COMMUNITY): Payer: Self-pay

## 2024-08-03 ENCOUNTER — Ambulatory Visit: Admitting: Internal Medicine

## 2024-08-03 VITALS — BP 102/68 | HR 99 | Temp 98.2°F | Wt 155.7 lb

## 2024-08-03 DIAGNOSIS — E538 Deficiency of other specified B group vitamins: Secondary | ICD-10-CM | POA: Diagnosis not present

## 2024-08-03 DIAGNOSIS — E063 Autoimmune thyroiditis: Secondary | ICD-10-CM | POA: Diagnosis not present

## 2024-08-03 DIAGNOSIS — F902 Attention-deficit hyperactivity disorder, combined type: Secondary | ICD-10-CM | POA: Diagnosis not present

## 2024-08-03 DIAGNOSIS — N3000 Acute cystitis without hematuria: Secondary | ICD-10-CM

## 2024-08-03 DIAGNOSIS — R300 Dysuria: Secondary | ICD-10-CM | POA: Diagnosis not present

## 2024-08-03 DIAGNOSIS — E039 Hypothyroidism, unspecified: Secondary | ICD-10-CM | POA: Insufficient documentation

## 2024-08-03 LAB — URINALYSIS, ROUTINE W REFLEX MICROSCOPIC
Hgb urine dipstick: NEGATIVE
Nitrite: NEGATIVE
RBC / HPF: NONE SEEN (ref 0–?)
Specific Gravity, Urine: >=1.03 — AB (ref 1.000–1.030)
Total Protein, Urine: 30 — AB
Urine Glucose: NEGATIVE
Urobilinogen, UA: 0.2 (ref 0.0–1.0)
pH: 6 (ref 5.0–8.0)

## 2024-08-03 LAB — POC URINALSYSI DIPSTICK (AUTOMATED)
Bilirubin, UA: NEGATIVE
Blood, UA: NEGATIVE
Glucose, UA: NEGATIVE
Ketones, UA: NEGATIVE
Nitrite, UA: NEGATIVE
Protein, UA: POSITIVE — AB
Spec Grav, UA: 1.025 (ref 1.010–1.025)
Urobilinogen, UA: 0.2 U/dL
pH, UA: 6 (ref 5.0–8.0)

## 2024-08-03 MED ORDER — LIOTHYRONINE SODIUM 5 MCG PO TABS
5.0000 ug | ORAL_TABLET | Freq: Every day | ORAL | 1 refills | Status: DC
  Filled 2024-08-03: qty 90, 90d supply, fill #0
  Filled 2024-11-02: qty 90, 90d supply, fill #1

## 2024-08-03 MED ORDER — NITROFURANTOIN MONOHYD MACRO 100 MG PO CAPS
100.0000 mg | ORAL_CAPSULE | Freq: Two times a day (BID) | ORAL | 0 refills | Status: AC
  Filled 2024-08-03: qty 14, 7d supply, fill #0

## 2024-08-03 MED ORDER — CYANOCOBALAMIN 1000 MCG/ML IJ SOLN
1000.0000 ug | INTRAMUSCULAR | 0 refills | Status: DC
  Filled 2024-08-03: qty 3, 90d supply, fill #0

## 2024-08-03 MED ORDER — LEVOTHYROXINE SODIUM 50 MCG PO TABS: 50.0000 ug | ORAL_TABLET | Freq: Every day | ORAL | Status: DC

## 2024-08-03 NOTE — Progress Notes (Signed)
 Established Patient Office Visit     CC/Reason for Visit: Discussed acute concerns  HPI: Barbara Wood is a 54 y.o. female who is coming in today for the above mentioned reasons.  Here to discuss a few issues.  After recent trip she has been feeling very fatigued, with hair loss.  At another physician's office had labs drawn that showed a very low TSH and vitamin B12 deficiency.  She has started IM B12 supplementation and decrease dose of Synthroid  from 100 to 50 mcg and has started Cytomel  5 mg daily.  She was also recently treated with Keflex  for UTI.  Feels like UTI has returned.   Past Medical/Surgical History: Past Medical History:  Diagnosis Date   Allergy Age 63   Arthritis    shoulders, rt hip   Asthma Since age 63   Depression Age 86   Family history of adverse reaction to anesthesia    mother and sister   GERD (gastroesophageal reflux disease)    Hashimoto's disease    Heart murmur Mitral valve prolapse age 3   Migraine    PONV (postoperative nausea and vomiting)    Pre-diabetes    Psoriasis    Thyroid  disease Since age 42    Past Surgical History:  Procedure Laterality Date   BREAST SURGERY  Reduction age 62   CERVICAL DISC ARTHROPLASTY N/A 11/27/2022   Procedure: ARTIFICIAL DISC REPLACEMENT Cervical five-six;  Surgeon: Carollee Lani BROCKS, DO;  Location: [facility] OR;  Service: Neurosurgery;  Laterality: N/A;   CHOLECYSTECTOMY  Age 70   KNEE ARTHROSCOPY W/ LATERAL RELEASE Right    REDUCTION MAMMAPLASTY     WISDOM TOOTH EXTRACTION      Social History:  reports that she has never smoked. She has never been exposed to tobacco smoke. She has never used smokeless tobacco. She reports current alcohol use. She reports current drug use.  Allergies: Allergies  Allergen Reactions   Amoxicillin-Pot Clavulanate Nausea And Vomiting    Augmentin causes nausea and vomiting   Sertraline Other (See Comments)    hallucinations    Sulfamethoxazole-Trimethoprim Hives    Trimethoprim    Triptans Other (See Comments)    Chest pain     Family History:  Family History  Problem Relation Age of Onset   Cancer Mother    Depression Mother    Hashimoto's thyroiditis Mother    Diabetes Mother    Bipolar disorder Mother    Hypertension Father    Ankylosing spondylitis Father    Psoriasis Father    Asthma Sister    Miscarriages / [place] Sister    Depression Sister    Psoriasis Sister    Psoriasis Sister    Arthritis Sister        psoriatic arthritis    Psoriasis Sister    Rheum arthritis Sister    Depression Sister    Anxiety disorder Sister    Eczema Sister    Psoriasis Brother    Migraines Brother    ADD / ADHD Brother    Eczema Brother    Allergies Brother    Psoriasis Brother    Psoriasis Maternal Aunt    ADD / ADHD Daughter    Eczema Daughter    Asthma Daughter    Hypermobility Daughter      Current Outpatient Medications:    albuterol  (PROAIR  HFA) 108 (90 Base) MCG/ACT inhaler, Inhale 2 puffs into the lungs every 6 (six) hours as needed for wheezing or shortness of breath.,  Disp: 6.7 g, Rfl: 2   albuterol  (VENTOLIN  HFA) 108 (90 Base) MCG/ACT inhaler, Inhale 2 puffs into the lungs every 6 (six) hours as needed for wheezing or shortness of breath, Disp: 6.7 g, Rfl: 1   ARIPiprazole  (ABILIFY ) 10 MG tablet, Take 1 tablet (10 mg total) by mouth daily., Disp: 90 tablet, Rfl: 0   atenolol  (TENORMIN ) 25 MG tablet, Take 2 tablets (50 mg total) by mouth daily., Disp: 180 tablet, Rfl: 1   Baclofen  5 MG TABS, Take 1-2 tablets (5-10 mg total) by mouth 3 (three) times daily as needed., Disp: 180 tablet, Rfl: 3   betamethasone  dipropionate 0.05 % cream, Apply topically 2 (two) times daily., Disp: 45 g, Rfl: 5   betamethasone  dipropionate 0.05 % lotion, Apply 1 application topically to the scalp daily. Taper use as able., Disp: 120 mL, Rfl: 3   Budesonide  (RHINOCORT  ALLERGY NA), Place 1 spray into both nostrils 2 (two) times daily., Disp: , Rfl:     budesonide -formoterol  (SYMBICORT ) 160-4.5 MCG/ACT inhaler, Inhale 2 puffs into the lungs 2 (two) times daily., Disp: 10.2 g, Rfl: 3   cephALEXin  (KEFLEX ) 500 MG capsule, Take 1 capsule (500 mg total) by mouth 2 (two) times daily., Disp: 14 capsule, Rfl: 0   cetirizine (ZYRTEC) 10 MG tablet, Take 1 tablet by mouth daily., Disp: , Rfl:    Cholecalciferol (VITAMIN D3) 250 MCG (10000 UT) capsule, Take 10,000 Units by mouth daily., Disp: , Rfl:    conjugated estrogens  (PREMARIN ) vaginal cream, Place 0.5 g vaginally 3 (three) times a week., Disp: 30 g, Rfl: 12   cyanocobalamin  (VITAMIN B12) 1000 MCG/ML injection, Inject 1 mL (1,000 mcg total) into the muscle every 30 (thirty) days., Disp: 3 mL, Rfl: 0   estradiol  (ESTRACE ) 1 MG tablet, Take 1 tablet (1 mg total) by mouth daily., Disp: 90 tablet, Rfl: 1   HYDROcodone -acetaminophen  (NORCO/VICODIN) 5-325 MG tablet, Take 1 tablet by mouth every 6 (six) hours as needed for pain., Disp: 30 tablet, Rfl: 0   HYDROcodone -acetaminophen  (NORCO/VICODIN) 5-325 MG tablet, Take 1 tablet by mouth every 6 (six) hours as needed for pain, Disp: 60 tablet, Rfl: 0   HYDROcodone -acetaminophen  (NORCO/VICODIN) 5-325 MG tablet, Take 1 tablet by mouth every 6 (six) hours as needed for pain., Disp: 60 tablet, Rfl: 0   lidocaine  (LIDODERM ) 5 %, Place 1 patch onto the skin daily. Remove & Discard patch within 12 hours or as directed by MD, Disp: 30 patch, Rfl: 2   liothyronine  (CYTOMEL ) 5 MCG tablet, Take 1 tablet (5 mcg total) by mouth daily., Disp: 90 tablet, Rfl: 1   lisdexamfetamine (VYVANSE ) 50 MG capsule, Take 1 capsule (50 mg total) by mouth daily., Disp: 30 capsule, Rfl: 0   lisdexamfetamine (VYVANSE ) 50 MG capsule, Take 1 capsule (50 mg total) by mouth daily. (05/21/24), Disp: 30 capsule, Rfl: 0   lisdexamfetamine (VYVANSE ) 50 MG capsule, Take 1 capsule (50 mg total) by mouth daily., Disp: 30 capsule, Rfl: 0   meclizine  (ANTIVERT ) 12.5 MG tablet, Take 1 tablet (12.5 mg total)  by mouth 3 (three) times daily as needed for dizziness., Disp: 10 tablet, Rfl: 11   metaxalone  (SKELAXIN ) 800 MG tablet, Take 1 tablet (800 mg total) by mouth 3 (three) times daily., Disp: 90 tablet, Rfl: 1   montelukast  (SINGULAIR ) 10 MG tablet, Take 1 tablet (10 mg total) by mouth at bedtime., Disp: 90 tablet, Rfl: 1   nitrofurantoin , macrocrystal-monohydrate, (MACROBID ) 100 MG capsule, Take 1 capsule (100 mg total) by mouth  2 (two) times daily for 7 days., Disp: 14 capsule, Rfl: 0   NON FORMULARY, Vyepti  infusions for migraine, Disp: , Rfl:    nystatin  (MYCOSTATIN ) 100000 UNIT/ML suspension, Take 5 mLs (500,000 Units total) by mouth 4 (four) times daily., Disp: 60 mL, Rfl: 0   omeprazole  (PRILOSEC) 20 MG capsule, Take 1 capsule (20 mg total) by mouth daily., Disp: 30 capsule, Rfl: 3   progesterone  (PROMETRIUM ) 100 MG capsule, Take 1 capsule (100 mg total) by mouth daily., Disp: 90 capsule, Rfl: 4   promethazine  (PHENERGAN ) 25 MG tablet, Take 1 tablet (25 mg total) by mouth every 6 (six) hours as needed for nausea or vomiting., Disp: 30 tablet, Rfl: 3   Rimegepant Sulfate  ([name]) 75 MG TBDP, Take 1 tablet (75 mg total) by mouth daily as needed (migraine). Max dose 1 tablet in 24 hours., Disp: 8 tablet, Rfl: 11   sodium chloride  0.9 % SOLN 100 mL with Eptinezumab -jjmr 100 MG/ML SOLN 300 mg, Inject 300 mg into the vein every 3 (three) months., Disp: 300 mg, Rfl: 4   traMADol  (ULTRAM ) 50 MG tablet, Take 2 tablets (100 mg total) by mouth 2 (two) times daily., Disp: 360 tablet, Rfl: 0   traZODone  (DESYREL ) 50 MG tablet, Take 1-2 tablets (50-100 mg total) by mouth at bedtime as needed for sleep., Disp: 90 tablet, Rfl: 1   triamcinolone  cream (KENALOG ) 0.1 %, Apply 1 Application topically 2 (two) times daily., Disp: 454 g, Rfl: 2   ZEPBOUND  10 MG/0.5ML injection vial, INJECT 0.5 ML (10 MG) UNDER THE SKIN ONCE WEEKLY (0.5ML= 50 UNITS), Disp: 2 mL, Rfl: 2   levothyroxine  (SYNTHROID ) 50 MCG tablet, Take 1  tablet (50 mcg total) by mouth daily., Disp: , Rfl:   Review of Systems:  Negative unless indicated in HPI.   Physical Exam: Vitals:   08/03/24 1326  BP: 102/68  Pulse: 99  Temp: 98.2 F (36.8 C)  TempSrc: Oral  SpO2: 100%  Weight: 155 lb 11.2 oz (70.6 kg)    Body mass index is 24.39 kg/m.   Physical Exam   Impression and Plan:  Acute cystitis without hematuria -     POCT Urinalysis Dipstick (Automated) -     Urine Culture -     Urinalysis; Future -     Nitrofurantoin  Monohyd Macro; Take 1 capsule (100 mg total) by mouth 2 (two) times daily for 7 days.  Dispense: 14 capsule; Refill: 0  Acquired hypothyroidism -     Liothyronine  Sodium; Take 1 tablet (5 mcg total) by mouth daily.  Dispense: 90 tablet; Refill: 1 -     TSH; Future  B12 deficiency -     Cyanocobalamin ; Inject 1 mL (1,000 mcg total) into the muscle every 30 (thirty) days.  Dispense: 3 mL; Refill: 0  Attention deficit hyperactivity disorder (ADHD), combined type  Hashimoto's disease -     Levothyroxine  Sodium; Take 1 tablet (50 mcg total) by mouth daily.   - Recheck TSH in 4 weeks to see if changes have normalized TSH. - In office urine dipstick positive for leukocytes, will treat with nitrofurantoin  for 7 days, send for urine culture. - Will send in IM B12 to do monthly at home.  Time spent:31 minutes reviewing chart, interviewing and examining patient and formulating plan of care.     Tully Theophilus Andrews, MD [facility]

## 2024-08-04 LAB — URINE CULTURE
MICRO NUMBER:: 16783214
Result:: NO GROWTH
SPECIMEN QUALITY:: ADEQUATE

## 2024-08-05 ENCOUNTER — Ambulatory Visit: Payer: Self-pay | Admitting: Adult Health

## 2024-08-06 ENCOUNTER — Ambulatory Visit: Admitting: Psychiatry

## 2024-08-06 DIAGNOSIS — F4310 Post-traumatic stress disorder, unspecified: Secondary | ICD-10-CM

## 2024-08-06 NOTE — Progress Notes (Signed)
 [facility] Counselor/Therapist Progress Note  Patient ID: Barbara Wood, MRN: [id number],    Date: 08/06/2024  Time Spent: 48 minutes start time 4:02 PM end time 4:50 PM  Treatment Type: Individual Therapy  Reported Symptoms: Migraines, health issues, fatigue, anxiety  Mental Status Exam:  Appearance:   Well Groomed     Behavior:  Appropriate  Motor:  Normal  Speech/Language:   Normal Rate  Affect:  Appropriate  Mood:  normal  Thought process:  normal  Thought content:    WNL  Sensory/Perceptual disturbances:    WNL  Orientation:  oriented to person, place, time/date, and situation  Attention:  Good  Concentration:  Good  Memory:  WNL  Fund of knowledge:   Good  Insight:    Good  Judgment:   Good  Impulse Control:  Good   Risk Assessment: Danger to Self:  No Self-injurious Behavior: No Danger to Others: No Duty to Warn:no Physical Aggression / Violence:No  Access to Firearms a concern: No  Gang Involvement:No   Subjective: Patient was present for session. She shared that her daughter had gotten very upset with her dad because he was not being a dad to her.  Patient explained that daughter had acknowledged she had been sleeping for 14 to 16 hours and he was not paying attention to that, he does not make sure she gets her medication as directed, and he does not typically respond to text messages or calls so she does not feel she can count on him.  Patient explained she had encouraged her daughter to talk to him about how she feels but she reported that she got too upset and asked patient to communicate those things with him.  Patient went on to share that her daughter has asked that she not contact him and tell him to apologize to daughter for his behavior which is what the daughter really wants in the situation.  Encouraged patient just to allow her daughter to say what she needs to say since he is choosing not to do the basics and parenting.  Patient was encouraged to  make arrangements to make sure daughters needs are met and to document all that she is doing at this time.  Patient was also encouraged to continue working with her attorney regarding financial situation with her ex since he is not doing anything to buy the truck from her like he is supposed to and continues to make bad choices regarding finances leaving patient with primary responsibility financially and physically of daughter.  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  Patient was encouraged to feel good about the progress that she is making and to think through what may be next in treatment.  Patient shared she is concerned that her health is not where she wants to be because of the fatigue but she is working with providers on that issue.  She is also worried about her daughter because of the sleeping 14 to 16 hours a day and that not being the norm for a child her age.  Encouraged her to leave a message for Dr. Conny so he can see what he might want to do regarding the situation.  Interventions: Solution-Oriented/Positive Psychology  Diagnosis:   ICD-10-CM   1. PTSD (post-traumatic stress disorder)  F43.10  Plan:  Patient is to use coping skills to decrease triggered responses.  Patient is to continue setting limits with her daughter and ex-husband.  Patient is to get back to exercising regularly.  Patient is to continue trying to focus on the positive things that are happening in her life.  Patient is to use self spotting as needed to decrease anxiety and rumination.  Patient is to work on diaphragmatic breathing safe place exercise and stretching/yoga throughout the day.  Patient is to focus on what she can control fix and change.  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    Silvano Pacini,  [facility]

## 2024-08-13 ENCOUNTER — Other Ambulatory Visit (HOSPITAL_COMMUNITY): Payer: Self-pay

## 2024-08-13 ENCOUNTER — Other Ambulatory Visit: Payer: Self-pay | Admitting: Internal Medicine

## 2024-08-13 DIAGNOSIS — E063 Autoimmune thyroiditis: Secondary | ICD-10-CM

## 2024-08-14 ENCOUNTER — Other Ambulatory Visit (HOSPITAL_COMMUNITY): Payer: Self-pay

## 2024-08-17 ENCOUNTER — Other Ambulatory Visit (HOSPITAL_COMMUNITY): Payer: Self-pay

## 2024-08-17 MED ORDER — LEVOTHYROXINE SODIUM 100 MCG PO TABS
100.0000 ug | ORAL_TABLET | Freq: Every day | ORAL | 2 refills | Status: DC
  Filled 2024-08-17: qty 90, 90d supply, fill #0
  Filled 2024-08-19: qty 30, 30d supply, fill #0

## 2024-08-19 ENCOUNTER — Other Ambulatory Visit: Payer: Self-pay | Admitting: Internal Medicine

## 2024-08-19 ENCOUNTER — Other Ambulatory Visit (HOSPITAL_COMMUNITY): Payer: Self-pay

## 2024-08-19 ENCOUNTER — Ambulatory Visit (INDEPENDENT_AMBULATORY_CARE_PROVIDER_SITE_OTHER): Admitting: Psychiatry

## 2024-08-19 ENCOUNTER — Encounter (HOSPITAL_COMMUNITY): Payer: Self-pay

## 2024-08-19 DIAGNOSIS — F900 Attention-deficit hyperactivity disorder, predominantly inattentive type: Secondary | ICD-10-CM

## 2024-08-19 DIAGNOSIS — F4310 Post-traumatic stress disorder, unspecified: Secondary | ICD-10-CM | POA: Diagnosis not present

## 2024-08-19 MED ORDER — LISDEXAMFETAMINE DIMESYLATE 50 MG PO CAPS
50.0000 mg | ORAL_CAPSULE | Freq: Every day | ORAL | 0 refills | Status: DC
  Filled 2024-08-19 – 2024-10-16 (×2): qty 30, 30d supply, fill #0
  Filled ????-??-??: fill #0

## 2024-08-19 MED ORDER — LISDEXAMFETAMINE DIMESYLATE 50 MG PO CAPS
50.0000 mg | ORAL_CAPSULE | Freq: Every day | ORAL | 0 refills | Status: DC
  Filled 2024-08-19 – 2024-09-17 (×2): qty 30, 30d supply, fill #0

## 2024-08-19 MED ORDER — LISDEXAMFETAMINE DIMESYLATE 50 MG PO CAPS
50.0000 mg | ORAL_CAPSULE | Freq: Every day | ORAL | 0 refills | Status: DC
  Filled 2024-08-19 – 2024-08-20 (×2): qty 30, 30d supply, fill #0

## 2024-08-19 NOTE — Progress Notes (Unsigned)
      [facility] Counselor/Therapist Progress Note  Patient ID: Taysia Rivere, MRN: [id number],    Date: 08/19/2024  Time Spent: 45 minutes start time 1:12 PM end time 1:57 PM  Treatment Type: Individual Therapy  Reported Symptoms: anxiety, sadness, triggered responses  Mental Status Exam:  Appearance:   Well Groomed     Behavior:  Appropriate  Motor:  Normal  Speech/Language:   Normal Rate  Affect:  Appropriate  Mood:  anxious  Thought process:  normal  Thought content:    WNL  Sensory/Perceptual disturbances:    WNL  Orientation:  oriented to person, place, time/date, and situation  Attention:  Good  Concentration:  Good  Memory:  WNL  Fund of knowledge:   Good  Insight:    Good  Judgment:   Good  Impulse Control:  Good   Risk Assessment: Danger to Self:  No Self-injurious Behavior: No Danger to Others: No Duty to Warn:no Physical Aggression / Violence:No  Access to Firearms a concern: No  Gang Involvement:No   Subjective: Patient was present for session. She shared that work has been stressful. She is still waiting to see what is next.  She explained she is not sure what to do with her work situation since expectations are increasing and her monetary compensation is decreasing.  She is also continuing to feel frustrated with her ex he still has not communicated with their daughter and she is spending the weekends with patient.  Patient is continuing to have to pay him child support as well as alimony.  She recognized at this point she is now able to make any changes but once she gets her CME's taking care of she may be able to at that time.  Patient also discussed having the anxiety and getting triggered with her daughter's behaviors because she still seems to have a great deal of depression and avoids things like her father did.  Discussed different ways to help her have some cognitive dissidence so she can help her daughter develop the skills she needs to be able to be  successful.  Patient was encouraged to continue using coping skills that have been helpful and make sure she is still taking time for her own self-care.  Interventions: Solution-Oriented/Positive Psychology  Diagnosis:   ICD-10-CM   1. PTSD (post-traumatic stress disorder)  F43.10       Plan: Patient is to use coping skills to decrease triggered responses.  Patient is to continue setting limits with her daughter and ex-husband.  Patient is to get back to exercising regularly.  Patient is to continue trying to focus on the positive things that are happening in her life.  Patient is to use self spotting as needed to decrease anxiety and rumination.  Patient is to work on diaphragmatic breathing safe place exercise and stretching/yoga throughout the day.  Patient is to focus on what she can control fix and change.  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    Silvano Pacini, [facility]

## 2024-08-20 ENCOUNTER — Other Ambulatory Visit (HOSPITAL_COMMUNITY): Payer: Self-pay

## 2024-08-20 ENCOUNTER — Telehealth: Payer: Self-pay | Admitting: Neurology

## 2024-08-20 ENCOUNTER — Other Ambulatory Visit: Payer: Self-pay

## 2024-08-20 DIAGNOSIS — G43E19 Chronic migraine with aura, intractable, without status migrainosus: Secondary | ICD-10-CM | POA: Diagnosis not present

## 2024-08-20 NOTE — Telephone Encounter (Signed)
 I saw her note from Lauraine on 7/10, and it appears she is having 4 migraine days a month(which is grea). But unfortunately unless she is having > 8 migraine days a month and > 15 total headache days a month, botox will not be approved. Unfortunately we may have breakthrough migraines during times of barometric pressure and treating them acutely is how we treat. If she is doing well I recommend continuing to follow up with NP for medication checks and refills. Glad she is doing well!

## 2024-08-20 NOTE — Telephone Encounter (Signed)
 Pt was wondering if she can talk to dr. Ines about possible botox in addition to Vyepti  b/c the only headaches she's still getting are due to barimetric pressure.

## 2024-09-01 ENCOUNTER — Other Ambulatory Visit (HOSPITAL_COMMUNITY): Payer: Self-pay

## 2024-09-02 ENCOUNTER — Telehealth: Payer: Self-pay | Admitting: Internal Medicine

## 2024-09-02 ENCOUNTER — Other Ambulatory Visit (INDEPENDENT_AMBULATORY_CARE_PROVIDER_SITE_OTHER)

## 2024-09-02 DIAGNOSIS — E039 Hypothyroidism, unspecified: Secondary | ICD-10-CM | POA: Diagnosis not present

## 2024-09-02 LAB — TSH: TSH: 4.86 u[IU]/mL (ref 0.35–5.50)

## 2024-09-02 NOTE — Telephone Encounter (Signed)
 Patient came in and is requesting a prescription for the COVID vaccine as the pharmacy will not give without the prescription.  Please advise at [phone]

## 2024-09-04 ENCOUNTER — Other Ambulatory Visit (HOSPITAL_COMMUNITY): Payer: Self-pay

## 2024-09-06 ENCOUNTER — Encounter: Payer: Self-pay | Admitting: Internal Medicine

## 2024-09-06 DIAGNOSIS — E063 Autoimmune thyroiditis: Secondary | ICD-10-CM

## 2024-09-07 ENCOUNTER — Other Ambulatory Visit (HOSPITAL_COMMUNITY): Payer: Self-pay

## 2024-09-07 ENCOUNTER — Other Ambulatory Visit: Payer: Self-pay | Admitting: Internal Medicine

## 2024-09-07 ENCOUNTER — Other Ambulatory Visit: Payer: Self-pay

## 2024-09-07 DIAGNOSIS — M7989 Other specified soft tissue disorders: Secondary | ICD-10-CM

## 2024-09-07 DIAGNOSIS — G4700 Insomnia, unspecified: Secondary | ICD-10-CM

## 2024-09-07 DIAGNOSIS — B0229 Other postherpetic nervous system involvement: Secondary | ICD-10-CM

## 2024-09-07 DIAGNOSIS — M5030 Other cervical disc degeneration, unspecified cervical region: Secondary | ICD-10-CM

## 2024-09-07 MED ORDER — ATENOLOL 25 MG PO TABS
50.0000 mg | ORAL_TABLET | Freq: Every day | ORAL | 1 refills | Status: AC
  Filled 2024-09-07: qty 180, 90d supply, fill #0
  Filled 2024-12-29: qty 180, 90d supply, fill #1

## 2024-09-08 ENCOUNTER — Other Ambulatory Visit (HOSPITAL_COMMUNITY): Payer: Self-pay

## 2024-09-08 ENCOUNTER — Other Ambulatory Visit: Payer: Self-pay

## 2024-09-08 MED ORDER — LIDOCAINE 5 % EX PTCH
1.0000 | MEDICATED_PATCH | CUTANEOUS | 2 refills | Status: DC
  Filled 2024-09-08: qty 30, 30d supply, fill #0
  Filled 2024-10-05: qty 30, 30d supply, fill #1
  Filled 2024-11-22: qty 30, 30d supply, fill #2

## 2024-09-08 MED ORDER — TRAZODONE HCL 50 MG PO TABS
50.0000 mg | ORAL_TABLET | Freq: Every evening | ORAL | 1 refills | Status: DC | PRN
  Filled 2024-09-08 – 2024-10-05 (×2): qty 90, 45d supply, fill #0
  Filled 2024-11-16: qty 90, 45d supply, fill #1

## 2024-09-09 ENCOUNTER — Other Ambulatory Visit (HOSPITAL_COMMUNITY): Payer: Self-pay

## 2024-09-09 ENCOUNTER — Ambulatory Visit: Admitting: Family Medicine

## 2024-09-09 VITALS — Ht 67.0 in

## 2024-09-09 DIAGNOSIS — M9908 Segmental and somatic dysfunction of rib cage: Secondary | ICD-10-CM

## 2024-09-09 DIAGNOSIS — M9903 Segmental and somatic dysfunction of lumbar region: Secondary | ICD-10-CM | POA: Diagnosis not present

## 2024-09-09 DIAGNOSIS — M9901 Segmental and somatic dysfunction of cervical region: Secondary | ICD-10-CM | POA: Diagnosis not present

## 2024-09-09 DIAGNOSIS — M25511 Pain in right shoulder: Secondary | ICD-10-CM

## 2024-09-09 DIAGNOSIS — M9902 Segmental and somatic dysfunction of thoracic region: Secondary | ICD-10-CM | POA: Diagnosis not present

## 2024-09-09 DIAGNOSIS — M9904 Segmental and somatic dysfunction of sacral region: Secondary | ICD-10-CM | POA: Diagnosis not present

## 2024-09-09 NOTE — Progress Notes (Unsigned)
 [facility] [address] [place] [address] Phone: [phone] Subjective:    I'm seeing this patient by the request  of:  Theophilus Delma Tully [name], MD  CC: Back pain, upper back pain  YEP:Dlagzrupcz  Barbara Wood is a 54 y.o. female coming in with complaint of back and neck pain. OMT 07/29/2024. Patient states has discomfort to the upper back.  Patient states that does not know if that is the shoulder this.  Is going to be traveling.  Finds it aggravating and is not responding to all the medications patient has on hand.  Medications patient has been prescribed:   Taking:         Reviewed prior external information including notes and imaging from previsou exam, outside providers and external EMR if available.   As well as notes that were available from care everywhere and other healthcare systems.  Past medical history, social, surgical and family history all reviewed in electronic medical record.  No pertanent information unless stated regarding to the chief complaint.   Past Medical History:  Diagnosis Date   Allergy Age 67   Arthritis    shoulders, rt hip   Asthma Since age 32   Depression Age 35   Family history of adverse reaction to anesthesia    mother and sister   GERD (gastroesophageal reflux disease)    Hashimoto's disease    Heart murmur Mitral valve prolapse age 24   Migraine    PONV (postoperative nausea and vomiting)    Pre-diabetes    Psoriasis    Thyroid  disease Since age 42    Allergies  Allergen Reactions   Amoxicillin-Pot Clavulanate Nausea And Vomiting    Augmentin causes nausea and vomiting   Sertraline Other (See Comments)    hallucinations    Sulfamethoxazole-Trimethoprim Hives   Trimethoprim    Triptans Other (See Comments)    Chest pain      Review of Systems:  No headache, visual changes, nausea, vomiting, diarrhea, constipation, dizziness, abdominal pain, skin rash, fevers, chills, night  sweats, weight loss, swollen lymph nodes, body aches, joint swelling, chest pain, shortness of breath, mood changes. POSITIVE muscle aches  Objective  Height 5' 7 (1.702 m), last menstrual period 10/31/2019.   General: No apparent distress alert and oriented x3 mood and affect normal, dressed appropriately.  HEENT: Pupils equal, extraocular movements intact  Respiratory: Patient's speak in full sentences and does not appear short of breath  Cardiovascular: No lower extremity edema, non tender, no erythema  Gait relatively normal MSK:  Back does have some loss of lordosis noted.  Some tenderness to palpation in the paraspinal musculature.  Tightness noted with FABER right greater than left.  Severe tightness noted noted in the right shoulder blade area.  Multiple small trigger points noted.  Mild positive impingement of the shoulder.  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  Patient is going to be traveling.  Has done well with her weight loss which is remarkable and will help with  every facet of her health.  Patient will be following up with me again 6 to 8 weeks otherwise.    Nonallopathic problems  Decision today to treat with OMT was based on Physical Exam  After verbal consent patient was treated with HVLA, ME, FPR techniques in cervical, rib, thoracic, lumbar, and sacral  areas avoided HVLA on the cervical spine  Patient tolerated the procedure well with improvement in symptoms  Patient given exercises, stretches and lifestyle  modifications  See medications in patient instructions if given  Patient will follow up in 4-8 weeks     The above documentation has been reviewed and is accurate and complete Barbara Serio M Lucciano Vitali, DO         Note: This dictation was prepared with Dragon dictation along with smaller phrase technology. Any transcriptional errors that result from this process are unintentional.

## 2024-09-10 ENCOUNTER — Encounter: Payer: Self-pay | Admitting: Family Medicine

## 2024-09-10 NOTE — Assessment & Plan Note (Signed)
 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  Patient is going to be traveling.  Has done well with her weight loss which is remarkable and will help with every facet of her health.  Patient will be following up with me again 6 to 8 weeks otherwise.

## 2024-09-11 ENCOUNTER — Other Ambulatory Visit (HOSPITAL_COMMUNITY): Payer: Self-pay

## 2024-09-14 ENCOUNTER — Other Ambulatory Visit (HOSPITAL_COMMUNITY): Payer: Self-pay

## 2024-09-15 ENCOUNTER — Ambulatory Visit: Admitting: Psychiatry

## 2024-09-15 DIAGNOSIS — F4310 Post-traumatic stress disorder, unspecified: Secondary | ICD-10-CM
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
## 2024-09-15 NOTE — Progress Notes (Unsigned)
 [name] [address] [place] [address] Phone: [phone] Subjective:   Barbara Wood Barbara Wood, am serving as a scribe for Dr. Arthea Claudene.  I'm seeing this patient by the request  of:  Theophilus Andrews, Tully GRADE, MD  CC: Back pain and shoulder pain.  YEP:Dlagzrupcz  Barbara Wood is a 54 y.o. female coming in with complaint of back and neck pain. OMT on 09/09/2024. Patient states that her shoulder pain is anterior. Using Voltaren  tabs and gel.   Medications patient has been prescribed:   Taking:         Reviewed prior external information including notes and imaging from previsou exam, outside providers and external EMR if available.   As well as notes that were available from care everywhere and other healthcare systems.  Past medical history, social, surgical and family history all reviewed in electronic medical record.  No pertanent information unless stated regarding to the chief complaint.   Past Medical History:  Diagnosis Date   Allergy Age 18   Arthritis    shoulders, rt hip   Asthma Since age 29   Depression Age 89   Family history of adverse reaction to anesthesia    mother and sister   GERD (gastroesophageal reflux disease)    Hashimoto's disease    Heart murmur Mitral valve prolapse age 68   Migraine    PONV (postoperative nausea and vomiting)    Pre-diabetes    Psoriasis    Thyroid  disease Since age 10    Allergies  Allergen Reactions   Amoxicillin-Pot Clavulanate Nausea And Vomiting    Augmentin causes nausea and vomiting   Sertraline Other (See Comments)    hallucinations    Sulfamethoxazole-Trimethoprim Hives   Trimethoprim    Triptans Other (See Comments)    Chest pain      Review of Systems:  No headache, visual changes, nausea, vomiting, diarrhea, constipation, dizziness, abdominal pain, skin rash, fevers, chills, night sweats, weight loss, swollen lymph nodes, body aches, joint swelling, chest pain,  shortness of breath, mood changes. POSITIVE muscle aches  Objective  Blood pressure 122/78, pulse 79, height 5' 7 (1.702 m), weight 154 lb (69.9 kg), last menstrual period 10/31/2019, SpO2 98%.   General: No apparent distress alert and oriented x3 mood and affect normal, dressed appropriately.  HEENT: Pupils equal, extraocular movements intact  Respiratory: Patient's speak in full sentences and does not appear short of breath  Cardiovascular: No lower extremity edema, non tender, no erythema  Gait MSK:  Back does have some tightness noted in the paraspinal musculature of the lumbar spine right greater than the left.  Does have tenderness slowly noted over the left sacroiliac joint that is fairly severe today.  Right shoulder exam shows tenderness over the acromioclavicular joint.  Positive crossover noted.  Osteopathic findings  T3 extended rotated and side bent right inhaled rib T9 extended rotated and side bent left L2 flexed rotated and side bent right Sacrum left on left   Procedure: Real-time Ultrasound Guided Injection of The right acromioclavicular joint Device: GE Logiq Q7 Ultrasound guided injection is preferred based studies that show increased duration, increased effect, greater accuracy, decreased procedural pain, increased response rate, and decreased cost with ultrasound guided versus blind injection.  Verbal informed consent obtained.  Time-out conducted.  Noted no overlying erythema, induration, or other signs of local infection.  Skin prepped in a sterile fashion.  Local anesthesia: Topical Ethyl chloride.  With sterile technique and under  real time ultrasound guidance: With a 25-gauge half inch needle injected with 0.5 cc of 0.5% Marcaine and 0.5 cc of Kenalog  40 mg/mL. Completed without difficulty  Pain immediately resolved suggesting accurate placement of the medication.  Images saved Advised to call if fevers/chills, erythema, induration, drainage, or  persistent bleeding.  Impression: Technically successful ultrasound guided injection.   Assessment and Plan:  Lumbar radiculopathy, acute Increase activity tolerance noted.  Discussed icing regimen and home exercises, increase activity slowly.  Discussed icing regimen.  Responded well to manipulation.  Follow-up again in 6 to 8 weeks  Arthritis of right acromioclavicular joint Patient given injection and tolerated the procedure well discussed icing regimen and home exercises, increase activity slowly.  Patient has responded well to injections previously and hopeful that this will give her significant improvement.  Has been 8 months since the last injection.  Follow-up again in 2 to 3 months    Nonallopathic problems  Decision today to treat with OMT was based on Physical Exam  After verbal consent patient was treated with HVLA, ME, FPR techniques in rib, thoracic, lumbar, and sacral  areas  Patient tolerated the procedure well with improvement in symptoms  Patient given exercises, stretches and lifestyle modifications  See medications in patient instructions if given  Patient will follow up in 6-8 weeks    The above documentation has been reviewed and is accurate and complete Marquelle Balow M Jahlia Omura, DO          Note: This dictation was prepared with Dragon dictation along with smaller phrase technology. Any transcriptional errors that result from this process are unintentional.

## 2024-09-16 ENCOUNTER — Other Ambulatory Visit: Payer: Self-pay | Admitting: Internal Medicine

## 2024-09-16 ENCOUNTER — Ambulatory Visit: Admitting: Family Medicine

## 2024-09-16 ENCOUNTER — Encounter: Payer: Self-pay | Admitting: Family Medicine

## 2024-09-16 ENCOUNTER — Other Ambulatory Visit: Payer: Self-pay

## 2024-09-16 VITALS — BP 122/78 | HR 79 | Ht 67.0 in | Wt 154.0 lb

## 2024-09-16 DIAGNOSIS — M9903 Segmental and somatic dysfunction of lumbar region: Secondary | ICD-10-CM

## 2024-09-16 DIAGNOSIS — M25511 Pain in right shoulder: Secondary | ICD-10-CM | POA: Diagnosis not present

## 2024-09-16 DIAGNOSIS — M9904 Segmental and somatic dysfunction of sacral region: Secondary | ICD-10-CM | POA: Diagnosis not present

## 2024-09-16 DIAGNOSIS — M19011 Primary osteoarthritis, right shoulder: Secondary | ICD-10-CM | POA: Diagnosis not present

## 2024-09-16 DIAGNOSIS — M9902 Segmental and somatic dysfunction of thoracic region: Secondary | ICD-10-CM

## 2024-09-16 DIAGNOSIS — M9908 Segmental and somatic dysfunction of rib cage: Secondary | ICD-10-CM | POA: Diagnosis not present

## 2024-09-16 DIAGNOSIS — M5416 Radiculopathy, lumbar region: Secondary | ICD-10-CM

## 2024-09-16 DIAGNOSIS — E063 Autoimmune thyroiditis: Secondary | ICD-10-CM

## 2024-09-16 MED ORDER — LEVOTHYROXINE SODIUM 88 MCG PO TABS: 88.0000 ug | ORAL_TABLET | Freq: Every day | ORAL | 1 refills | Status: DC

## 2024-09-16 NOTE — Assessment & Plan Note (Signed)
 Patient given injection and tolerated the procedure well discussed icing regimen and home exercises, increase activity slowly.  Patient has responded well to injections previously and hopeful that this will give her significant improvement.  Has been 8 months since the last injection.  Follow-up again in 2 to 3 months

## 2024-09-16 NOTE — Patient Instructions (Addendum)
Injected AC joint today

## 2024-09-16 NOTE — Assessment & Plan Note (Signed)
 Increase activity tolerance noted.  Discussed icing regimen and home exercises, increase activity slowly.  Discussed icing regimen.  Responded well to manipulation.  Follow-up again in 6 to 8 weeks

## 2024-09-17 ENCOUNTER — Other Ambulatory Visit (HOSPITAL_COMMUNITY): Payer: Self-pay

## 2024-09-18 ENCOUNTER — Ambulatory Visit

## 2024-09-18 ENCOUNTER — Other Ambulatory Visit: Payer: Self-pay

## 2024-09-18 ENCOUNTER — Encounter: Payer: Self-pay | Admitting: Family Medicine

## 2024-09-18 ENCOUNTER — Ambulatory Visit: Admitting: Family Medicine

## 2024-09-18 ENCOUNTER — Ambulatory Visit: Payer: Self-pay

## 2024-09-18 DIAGNOSIS — R42 Dizziness and giddiness: Secondary | ICD-10-CM

## 2024-09-18 DIAGNOSIS — R0781 Pleurodynia: Secondary | ICD-10-CM

## 2024-09-18 DIAGNOSIS — M898X1 Other specified disorders of bone, shoulder: Secondary | ICD-10-CM

## 2024-09-18 DIAGNOSIS — M25511 Pain in right shoulder: Secondary | ICD-10-CM

## 2024-09-18 DIAGNOSIS — M25551 Pain in right hip: Secondary | ICD-10-CM

## 2024-09-18 DIAGNOSIS — M542 Cervicalgia: Secondary | ICD-10-CM

## 2024-09-18 DIAGNOSIS — M5416 Radiculopathy, lumbar region: Secondary | ICD-10-CM | POA: Diagnosis not present

## 2024-09-18 DIAGNOSIS — M546 Pain in thoracic spine: Secondary | ICD-10-CM | POA: Diagnosis not present

## 2024-09-18 MED ORDER — KETOROLAC TROMETHAMINE 60 MG/2ML IM SOLN
60.0000 mg | Freq: Once | INTRAMUSCULAR | Status: AC
  Administered 2024-09-18: 60 mg via INTRAMUSCULAR

## 2024-09-18 MED ORDER — METHYLPREDNISOLONE ACETATE 80 MG/ML IJ SUSP
80.0000 mg | Freq: Once | INTRAMUSCULAR | Status: AC
  Administered 2024-09-18: 80 mg via INTRAMUSCULAR
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
## 2024-09-18 NOTE — Progress Notes (Signed)
 Patient received Toradol  60 mg/2ml in left buttocks and Methylprednisolone  80 mg/ml in right buttocks. Patient tolerated well.
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
## 2024-09-18 NOTE — Patient Instructions (Signed)
 Follow up as needed or as scheduled Take the NSAIDs and muscle relaxers as needed We will assess the xrays and determine next steps ICE/HEAT, gentle stretching Massage if tolerable Meclizine  as needed for dizziness Call with any questions or concerns Stay Safe!  Stay Healthy! Hang in there!!!

## 2024-09-18 NOTE — Telephone Encounter (Signed)
 Appt today at 12:40pm with Dr Mahlon.
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
## 2024-09-21 ENCOUNTER — Ambulatory Visit: Admitting: Family Medicine

## 2024-09-21 VITALS — BP 108/76 | HR 80 | Ht 67.0 in

## 2024-09-21 DIAGNOSIS — M5030 Other cervical disc degeneration, unspecified cervical region: Secondary | ICD-10-CM

## 2024-09-21 DIAGNOSIS — M5416 Radiculopathy, lumbar region: Secondary | ICD-10-CM | POA: Diagnosis not present

## 2024-09-21 DIAGNOSIS — M542 Cervicalgia: Secondary | ICD-10-CM

## 2024-09-21 MED ORDER — KETOROLAC TROMETHAMINE 60 MG/2ML IM SOLN
60.0000 mg | Freq: Once | INTRAMUSCULAR | Status: AC
  Administered 2024-09-21: 60 mg via INTRAMUSCULAR

## 2024-09-21 NOTE — Patient Instructions (Signed)
 Good to see you. Toradol  60 mg in backside. [place] and AC sprain. Mri cervical Use medications accordingly. Keep scheduled appointments.
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
## 2024-09-25 ENCOUNTER — Telehealth: Payer: Self-pay | Admitting: Family Medicine

## 2024-09-25 NOTE — Telephone Encounter (Addendum)
 Received a call from [facility] with a call report in regards to the right scapula xray.  FINDINGS: Irregularity along the scapular neck of unclear etiology. Question healed fracture. Well corticated 3 mm triangular density along the inferior glenoid likely old healed fracture. Soft tissues are unremarkable.

## 2024-09-27 ENCOUNTER — Ambulatory Visit: Payer: Self-pay | Admitting: Family Medicine

## 2024-09-29 ENCOUNTER — Other Ambulatory Visit (HOSPITAL_COMMUNITY): Payer: Self-pay

## 2024-09-29 ENCOUNTER — Other Ambulatory Visit: Payer: Self-pay | Admitting: Radiology

## 2024-09-29 ENCOUNTER — Other Ambulatory Visit: Payer: Self-pay | Admitting: Internal Medicine

## 2024-09-29 ENCOUNTER — Ambulatory Visit: Admitting: Psychiatry

## 2024-09-29 DIAGNOSIS — G43909 Migraine, unspecified, not intractable, without status migrainosus: Secondary | ICD-10-CM

## 2024-09-29 DIAGNOSIS — R9389 Abnormal findings on diagnostic imaging of other specified body structures: Secondary | ICD-10-CM

## 2024-09-29 DIAGNOSIS — E063 Autoimmune thyroiditis: Secondary | ICD-10-CM

## 2024-09-29 DIAGNOSIS — M7918 Myalgia, other site: Secondary | ICD-10-CM

## 2024-09-29 NOTE — Telephone Encounter (Signed)
 Med refill request:  Progesterone  (prometrium ) 100 mg capsules Start: 07/24/23 Disp: 90 cap with 0 of 4 remaining  Last AEX:  07/24/23 Next AEX:  11/18/24 Last MMG (if hormonal med):  07/15/24 Refill authorized?  Please Advise.

## 2024-09-30 ENCOUNTER — Other Ambulatory Visit: Payer: Self-pay

## 2024-09-30 ENCOUNTER — Other Ambulatory Visit (HOSPITAL_COMMUNITY): Payer: Self-pay

## 2024-09-30 ENCOUNTER — Other Ambulatory Visit: Payer: Self-pay | Admitting: *Deleted

## 2024-09-30 MED ORDER — PROGESTERONE MICRONIZED 100 MG PO CAPS
100.0000 mg | ORAL_CAPSULE | Freq: Every day | ORAL | 4 refills | Status: DC
  Filled 2024-09-30: qty 90, 90d supply, fill #0
  Filled 2024-12-29: qty 90, 90d supply, fill #1

## 2024-09-30 MED ORDER — TRAMADOL HCL 50 MG PO TABS
100.0000 mg | ORAL_TABLET | Freq: Two times a day (BID) | ORAL | 0 refills | Status: DC
  Filled 2024-09-30: qty 360, 90d supply, fill #0

## 2024-09-30 MED ORDER — BACLOFEN 5 MG PO TABS
5.0000 mg | ORAL_TABLET | Freq: Three times a day (TID) | ORAL | 3 refills | Status: DC | PRN
  Filled 2024-09-30: qty 180, 30d supply, fill #0
  Filled 2024-11-02: qty 180, 30d supply, fill #1
  Filled 2024-12-01: qty 180, 30d supply, fill #2

## 2024-09-30 MED ORDER — LEVOTHYROXINE SODIUM 88 MCG PO TABS
88.0000 ug | ORAL_TABLET | Freq: Every day | ORAL | 1 refills | Status: AC
  Filled 2024-09-30: qty 90, 90d supply, fill #0
  Filled 2024-12-29: qty 90, 90d supply, fill #1

## 2024-09-30 NOTE — Telephone Encounter (Signed)
 Opened in error

## 2024-09-30 NOTE — Telephone Encounter (Signed)
 Patient notified of refill.

## 2024-10-01 ENCOUNTER — Other Ambulatory Visit (HOSPITAL_COMMUNITY): Payer: Self-pay

## 2024-10-01 DIAGNOSIS — D2272 Melanocytic nevi of left lower limb, including hip: Secondary | ICD-10-CM | POA: Diagnosis not present

## 2024-10-01 DIAGNOSIS — Z85828 Personal history of other malignant neoplasm of skin: Secondary | ICD-10-CM | POA: Diagnosis not present

## 2024-10-01 DIAGNOSIS — L308 Other specified dermatitis: Secondary | ICD-10-CM | POA: Diagnosis not present

## 2024-10-01 DIAGNOSIS — L821 Other seborrheic keratosis: Secondary | ICD-10-CM | POA: Diagnosis not present

## 2024-10-01 DIAGNOSIS — D2271 Melanocytic nevi of right lower limb, including hip: Secondary | ICD-10-CM | POA: Diagnosis not present

## 2024-10-01 DIAGNOSIS — L905 Scar conditions and fibrosis of skin: Secondary | ICD-10-CM | POA: Diagnosis not present

## 2024-10-01 DIAGNOSIS — L400 Psoriasis vulgaris: Secondary | ICD-10-CM | POA: Diagnosis not present

## 2024-10-01 DIAGNOSIS — D225 Melanocytic nevi of trunk: Secondary | ICD-10-CM | POA: Diagnosis not present

## 2024-10-01 MED ORDER — BETAMETHASONE DIPROPIONATE 0.05 % EX LOTN
TOPICAL_LOTION | CUTANEOUS | 11 refills | Status: AC
  Filled 2024-10-01: qty 60, 30d supply, fill #0
  Filled 2024-11-16: qty 60, 30d supply, fill #1

## 2024-10-01 MED ORDER — BETAMETHASONE DIPROPIONATE 0.05 % EX OINT
TOPICAL_OINTMENT | CUTANEOUS | 6 refills | Status: AC
  Filled 2024-10-01: qty 90, 30d supply, fill #0

## 2024-10-02 ENCOUNTER — Other Ambulatory Visit (HOSPITAL_COMMUNITY): Payer: Self-pay

## 2024-10-05 ENCOUNTER — Other Ambulatory Visit (HOSPITAL_COMMUNITY): Payer: Self-pay

## 2024-10-06 ENCOUNTER — Other Ambulatory Visit (HOSPITAL_COMMUNITY): Payer: Self-pay

## 2024-10-07 ENCOUNTER — Telehealth: Payer: Self-pay

## 2024-10-07 NOTE — Telephone Encounter (Signed)
 Called and left voicemail for patient to reschedule appointment on 1/21 with Dr Ines.  If patient calls back, they can be rescheduled with Dr Vear

## 2024-10-12 NOTE — Telephone Encounter (Signed)
 Patient rescheduled appointment with Dr. Vear.05/12/25 at 1:30 pm

## 2024-10-13 ENCOUNTER — Ambulatory Visit (INDEPENDENT_AMBULATORY_CARE_PROVIDER_SITE_OTHER): Admitting: Psychiatry

## 2024-10-13 DIAGNOSIS — F4310 Post-traumatic stress disorder, unspecified: Secondary | ICD-10-CM

## 2024-10-13 NOTE — Progress Notes (Signed)
 [facility] Counselor[facility]Therapist Progress Note  Patient ID: Barbara Wood, MRN: [id number],    Date: 10/13/2024  Time Spent: 40 minutes start time 2:58 PM end time 3:38 PM Virtual Visit via Video Note Connected with patient by a telemedicine/telehealth application, with their informed consent, and verified patient privacy and that I am speaking with the correct person using two identifiers. I discussed the limitations, risks, security and privacy concerns of performing psychotherapy and the availability of in person appointments. I also discussed with the patient that there may be a patient responsible charge related to this service. The patient expressed understanding and agreed to proceed. I discussed the treatment planning with the patient. The patient was provided an opportunity to ask questions and all were answered. The patient agreed with the plan and demonstrated an understanding of the instructions. The patient was advised to call  our office if  symptoms worsen or feel they are in a crisis state and need immediate contact.   Therapist Location: home Patient Location: parking garage at hospital    Treatment Type: Individual Therapy  Reported Symptoms: anxiety, triggered responses, crying spell  Mental Status Exam:  Appearance:   Well Groomed     Behavior:  Appropriate  Motor:  Normal  Speech/Language:   Normal Rate  Affect:  Appropriate  Mood:  anxious  Thought process:  normal  Thought content:    WNL  Sensory/Perceptual disturbances:    WNL  Orientation:  oriented to person, place, time/date, and situation  Attention:  Good  Concentration:  Good  Memory:  WNL  Fund of knowledge:   Good  Insight:    Good  Judgment:   Good  Impulse Control:  Good   Risk Assessment: Danger to Self:  No Self-injurious Behavior: No Danger to Others: No Duty to Warn:no Physical Aggression / Violence:No  Access to Firearms a concern: No  Gang Involvement:No   Subjective: Met  with patient via virtual session. She shared that she was stressed due to her scheduling situation at work. She had finished everything at work and they added things at the last minute.  She shared she had to cancel last session due to having a code at work.  She went on to share that last week everything got overwhelming for her work because expectations continue to go up as well as jobs but nothing is being taken away so she is feeling very overwhelmed.  Patient was able to share some of her frustrations and concerns and tried to problem solve ways to manage the stress differently.  Patient stated that things are also very difficult at home and her daughter is reporting so the house is overwhelming.  A friend of hers is coming over to help her purge some of the things at their house.  Patient still has some CME's to finish up by December discussed different ways that she can make that happen with her situation as well.  Patient stated that her ex is still not getting their daughter so she is not getting any breaks or able to get time when she is not responsible for someone.  Encouraged her to recognize that she is doing the right thing by taking care of her daughter even though it is difficult but even through that she is go to find some time for self-care.  Patient was able to think of some things that she can do in her busy schedule for herself so that she can stay grounded and continue taking  care of her daughter patient's and running her household.  Interventions: Solution-Oriented/Positive Psychology and Insight-Oriented  Diagnosis:   ICD-10-CM   1. PTSD (post-traumatic stress disorder)  F43.10       Plan: Patient is to use coping skills to decrease triggered responses.  Patient is to work on ways to find self-care and her very busy situation.  Patient is to get back to exercising regularly.  Patient is to continue trying to focus on the positive things that are happening in her life.  Patient is to  use self spotting as needed to decrease anxiety and rumination.  Patient is to work on diaphragmatic breathing safe place exercise and stretching/yoga throughout the day.  Patient is to focus on what she can control fix and change.  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    Silvano Pacini, [facility]
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
## 2024-10-14 ENCOUNTER — Other Ambulatory Visit (HOSPITAL_COMMUNITY): Payer: Self-pay

## 2024-10-15 ENCOUNTER — Ambulatory Visit (INDEPENDENT_AMBULATORY_CARE_PROVIDER_SITE_OTHER): Admitting: Family Medicine

## 2024-10-15 ENCOUNTER — Encounter: Payer: Self-pay | Admitting: Family Medicine

## 2024-10-15 VITALS — BP 96/62 | HR 87 | Ht 67.0 in | Wt 148.0 lb

## 2024-10-15 DIAGNOSIS — M9902 Segmental and somatic dysfunction of thoracic region: Secondary | ICD-10-CM

## 2024-10-15 DIAGNOSIS — M9903 Segmental and somatic dysfunction of lumbar region: Secondary | ICD-10-CM | POA: Diagnosis not present

## 2024-10-15 DIAGNOSIS — M9908 Segmental and somatic dysfunction of rib cage: Secondary | ICD-10-CM

## 2024-10-15 DIAGNOSIS — M5030 Other cervical disc degeneration, unspecified cervical region: Secondary | ICD-10-CM

## 2024-10-15 DIAGNOSIS — M9901 Segmental and somatic dysfunction of cervical region: Secondary | ICD-10-CM | POA: Diagnosis not present

## 2024-10-15 DIAGNOSIS — M9904 Segmental and somatic dysfunction of sacral region: Secondary | ICD-10-CM

## 2024-10-15 NOTE — Assessment & Plan Note (Signed)
 Patient is doing somewhat better with the baclofen  but taking it extremely regularly 8 to 10 mg up to 3 times a day to help with spasms.  This is in the neck and lower extremities.  Still awaiting the MRI which is ordered in 2 days.  Did do some muscle energy to try to help with some of the range of motion and discomfort.  Follow-up again in 6 to 8 weeks

## 2024-10-16 ENCOUNTER — Other Ambulatory Visit (HOSPITAL_COMMUNITY): Payer: Self-pay

## 2024-10-17 ENCOUNTER — Ambulatory Visit
Admission: RE | Admit: 2024-10-17 | Discharge: 2024-10-17 | Disposition: A | Source: Ambulatory Visit | Attending: Family Medicine | Admitting: Family Medicine

## 2024-10-17 DIAGNOSIS — M4802 Spinal stenosis, cervical region: Secondary | ICD-10-CM | POA: Diagnosis not present

## 2024-10-17 DIAGNOSIS — M542 Cervicalgia: Secondary | ICD-10-CM

## 2024-10-17 DIAGNOSIS — M5020 Other cervical disc displacement, unspecified cervical region: Secondary | ICD-10-CM | POA: Diagnosis not present

## 2024-10-21 ENCOUNTER — Ambulatory Visit: Payer: Self-pay | Admitting: Family Medicine

## 2024-10-27 NOTE — Progress Notes (Addendum)
 [facility] [address] [place] [address] Phone: [phone] Subjective:   Barbara Wood, am serving as a scribe for Dr. Arthea Wood.  I'm seeing this patient by the request  of:  Theophilus Andrews, Tully GRADE, MD  CC: back and neck and pan f/u  YEP:Dlagzrupcz  Barbara Wood is a 54 y.o. female coming in with complaint of back and neck pain. OMT 10/15/2024. Patient states doing okay. Needs shoulder injection and posterior ribs hurt last week.  Medications patient has been prescribed: None  Taking:     Patient was having more neck pain unfortunately MRI did show that there continued to be bone spurring noted with compression at the vicinity of patient's previous fusion.    Reviewed prior external information including notes and imaging from previsou exam, outside providers and external EMR if available.   As well as notes that were available from care everywhere and other healthcare systems.  Past medical history, social, surgical and family history all reviewed in electronic medical record.  No pertanent information unless stated regarding to the chief complaint.   Past Medical History:  Diagnosis Date   Allergy Age 31   Arthritis    shoulders, rt hip   Asthma Since age 71   Depression Age 89   Family history of adverse reaction to anesthesia    mother and sister   GERD (gastroesophageal reflux disease)    Hashimoto's disease    Heart murmur Mitral valve prolapse age 51   Migraine    PONV (postoperative nausea and vomiting)    Pre-diabetes    Psoriasis    Thyroid  disease Since age 18    Allergies  Allergen Reactions   Amoxicillin-Pot Clavulanate Nausea And Vomiting    Augmentin causes nausea and vomiting   Sertraline Other (See Comments)    hallucinations    Sulfamethoxazole-Trimethoprim Hives   Trimethoprim    Triptans Other (See Comments)    Chest pain      Review of Systems:  No headache, visual changes, nausea,  vomiting, diarrhea, constipation, dizziness, abdominal pain, skin rash, fevers, chills, night sweats, weight loss, swollen lymph nodes, body aches, joint swelling, chest pain, shortness of breath, mood changes. POSITIVE muscle aches  Objective  Blood pressure 118/76, pulse 85, height 5' 7 (1.702 m), weight 148 lb (67.1 kg), last menstrual period 10/31/2019, SpO2 97%.   General: No apparent distress alert and oriented x3 mood and affect normal, dressed appropriately.  HEENT: Pupils equal, extraocular movements intact  Respiratory: Patient's speak in full sentences and does not appear short of breath  Cardiovascular: No lower extremity edema, non tender, no erythema  Gait MSK:  Back mild loss lordosis noted.  Tightness in the thoracolumbar juncture.  Seems to be more left greater than right.  Some tightness noted in the parascapular area right greater than left.  Deferred neck exam today. Left shoulder exam shows positive crossover noted.  Tenderness to palpation over the superior aspect of the acromioclavicular joint.  Osteopathic findings  T3 extended rotated and side bent right inhaled rib T11 extended rotated and side bent left L1 flexed rotated and side bent right Sacrum right on right   Procedure: Real-time Ultrasound Guided Injection of left acromioclavicular joint Device: GE Logiq Q7 Ultrasound guided injection is preferred based studies that show increased duration, increased effect, greater accuracy, decreased procedural pain, increased response rate, and decreased cost with ultrasound guided versus blind injection.  Verbal informed consent obtained.  Time-out conducted.  Noted no overlying erythema, induration, or other signs of local infection.  Skin prepped in a sterile fashion.  Local anesthesia: Topical Ethyl chloride.  With sterile technique and under real time ultrasound guidance: With a 25-gauge half inch needle injected with 0.5 cc of 0.5% Marcaine and 0.5 cc of  depomedrol 40 mg/mL Completed without difficulty  Pain immediately resolved suggesting accurate placement of the medication.  Advised to call if fevers/chills, erythema, induration, drainage, or persistent bleeding.  Images saved Impression: Technically successful ultrasound guided injection.    Assessment and Plan:  Effusion of acromioclavicular joint, left 6 months since previous injection.  Hopeful that this will make a significant difference.  Have seen patient for quite some time.  Has been making significant improvement.  Discussed icing regimen and home exercises, continue with core strengthening where possible and discussed ergonomics.  Follow-up again in 6 to 8 weeks  Lumbar radiculopathy, acute Likely no radicular symptoms at the moment.  Discussed which activities to do and which ones to avoid.  Increase activity slowly.  Discussed icing regimen.  Follow-up again in 6 to 8 weeks    Nonallopathic problems  Decision today to treat with OMT was based on Physical Exam  After verbal consent patient was treated with  ME, FPR techniques in  rib, thoracic, lumbar, and sacral  areas  Patient tolerated the procedure well with improvement in symptoms  Patient given exercises, stretches and lifestyle modifications  See medications in patient instructions if given  Patient will follow up in 4-8 weeks      The above documentation has been reviewed and is accurate and complete Barbara Wood M Barbara Holm, DO        Note: This dictation was prepared with Dragon dictation along with smaller phrase technology. Any transcriptional errors that result from this process are unintentional.

## 2024-10-28 ENCOUNTER — Ambulatory Visit: Admitting: Psychiatry

## 2024-10-29 ENCOUNTER — Other Ambulatory Visit: Payer: Self-pay

## 2024-10-29 ENCOUNTER — Ambulatory Visit: Admitting: Family Medicine

## 2024-10-29 ENCOUNTER — Encounter: Payer: Self-pay | Admitting: Family Medicine

## 2024-10-29 VITALS — BP 118/76 | HR 85 | Ht 67.0 in | Wt 148.0 lb

## 2024-10-29 DIAGNOSIS — M9904 Segmental and somatic dysfunction of sacral region: Secondary | ICD-10-CM

## 2024-10-29 DIAGNOSIS — M5416 Radiculopathy, lumbar region: Secondary | ICD-10-CM

## 2024-10-29 DIAGNOSIS — M9903 Segmental and somatic dysfunction of lumbar region: Secondary | ICD-10-CM

## 2024-10-29 DIAGNOSIS — M25512 Pain in left shoulder: Secondary | ICD-10-CM

## 2024-10-29 DIAGNOSIS — M9908 Segmental and somatic dysfunction of rib cage: Secondary | ICD-10-CM

## 2024-10-29 DIAGNOSIS — M9902 Segmental and somatic dysfunction of thoracic region: Secondary | ICD-10-CM | POA: Diagnosis not present

## 2024-10-29 DIAGNOSIS — M25412 Effusion, left shoulder: Secondary | ICD-10-CM | POA: Diagnosis not present

## 2024-10-29 MED ORDER — METHYLPREDNISOLONE ACETATE 40 MG/ML IJ SUSP
20.0000 mg | Freq: Once | INTRAMUSCULAR | Status: AC
  Administered 2024-10-29: 20 mg via INTRA_ARTICULAR

## 2024-10-29 NOTE — Patient Instructions (Addendum)
 Injection in shoulder today Good to see you! See you again in 6-8 weeks

## 2024-10-29 NOTE — Assessment & Plan Note (Signed)
 Likely no radicular symptoms at the moment.  Discussed which activities to do and which ones to avoid.  Increase activity slowly.  Discussed icing regimen.  Follow-up again in 6 to 8 weeks

## 2024-10-29 NOTE — Assessment & Plan Note (Signed)
 6 months since previous injection.  Hopeful that this will make a significant difference.  Have seen patient for quite some time.  Has been making significant improvement.  Discussed icing regimen and home exercises, continue with core strengthening where possible and discussed ergonomics.  Follow-up again in 6 to 8 weeks

## 2024-11-02 ENCOUNTER — Other Ambulatory Visit (HOSPITAL_COMMUNITY): Payer: Self-pay

## 2024-11-03 ENCOUNTER — Other Ambulatory Visit (HOSPITAL_COMMUNITY): Payer: Self-pay

## 2024-11-03 ENCOUNTER — Telehealth (HOSPITAL_COMMUNITY): Payer: Self-pay

## 2024-11-04 ENCOUNTER — Other Ambulatory Visit (HOSPITAL_COMMUNITY): Payer: Self-pay

## 2024-11-04 ENCOUNTER — Telehealth (HOSPITAL_COMMUNITY): Payer: Self-pay

## 2024-11-04 ENCOUNTER — Telehealth: Payer: Self-pay

## 2024-11-04 DIAGNOSIS — G43009 Migraine without aura, not intractable, without status migrainosus: Secondary | ICD-10-CM

## 2024-11-04 MED ORDER — NURTEC 75 MG PO TBDP
1.0000 | ORAL_TABLET | Freq: Every day | ORAL | 5 refills | Status: AC | PRN
  Filled 2024-11-04: qty 8, 30d supply, fill #0
  Filled 2024-12-14: qty 8, 8d supply, fill #0
  Filled 2024-12-14: qty 8, 30d supply, fill #0

## 2024-11-04 NOTE — Telephone Encounter (Signed)
 Barbara Wood last saw patient. Will send refill.

## 2024-11-04 NOTE — Telephone Encounter (Signed)
 It is time for PT to renew PA for [name]-however she was a patient of Dr. Ines, would a prescriber be willing to send in updated RX for PT in order for us  to do a PA or have PT come in to office for updated chart notes with new provider? Please advise-Thanks.

## 2024-11-05 ENCOUNTER — Other Ambulatory Visit (HOSPITAL_COMMUNITY): Payer: Self-pay

## 2024-11-05 NOTE — Telephone Encounter (Signed)
 Pharmacy Patient Advocate Encounter  Received notification from [employer] that Prior Authorization for [name] has been APPROVED from 11/05/2024 to 11/04/2025. Ran test claim, Copay is $0. This test claim was processed through [facility]- copay amounts may vary at other pharmacies due to pharmacy/plan contracts, or as the patient moves through the different stages of their insurance plan.   PA #/Case ID/Reference #: [id number]

## 2024-11-10 ENCOUNTER — Telehealth: Payer: Self-pay | Admitting: Neurology

## 2024-11-10 NOTE — Telephone Encounter (Signed)
 ..  Pt understands that although there may be some limitations with this type of visit, we will take all precautions to reduce any security or privacy concerns.  Pt understands that this will be treated like an in office visit and we will file with pt's insurance, and there may be a patient responsible charge related to this service. ? ?

## 2024-11-11 ENCOUNTER — Ambulatory Visit: Admitting: Psychiatry

## 2024-11-11 DIAGNOSIS — F4310 Post-traumatic stress disorder, unspecified: Secondary | ICD-10-CM | POA: Diagnosis not present

## 2024-11-11 NOTE — Progress Notes (Signed)
 [facility] Counselor/Therapist Progress Note  Patient ID: Barbara Wood, MRN: [id number],    Date: 11/11/2024  Time Spent: 52 minutes start time 2:00 PM end time 2:52 PM  Treatment Type: Individual Therapy  Reported Symptoms: anxiety, triggered responses, frustration, rumination  Mental Status Exam:  Appearance:   Well Groomed     Behavior:  Appropriate  Motor:  Normal  Speech/Language:   Normal Rate  Affect:  Appropriate  Mood:  anxious  Thought process:  normal  Thought content:    WNL  Sensory/Perceptual disturbances:    WNL  Orientation:  oriented to person, place, time/date, and situation  Attention:  Good  Concentration:  Good  Memory:  WNL  Fund of knowledge:   Good  Insight:    Good  Judgment:   Good  Impulse Control:  Good   Risk Assessment: Danger to Self:  No Self-injurious Behavior: No Danger to Others: No Duty to Warn:no Physical Aggression / Violence:No  Access to Firearms a concern: No  Gang Involvement:No   Subjective: Patient was present for session. She went on to share that she had to have a meeting at work due to an incident with one of the nursing staff.  Patient explained that she was told that someone had made a complaint about her.  Patient explained the situation when she had a complaint made about her.  She explained there had been to 1 when patients waited for over 30 minutes to be checked again because office staff had decided to have a meeting rather than checking patient's in which put everybody late and patients were very upset.  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  Patient stated she only gets real agitated at work when she is concerned about the safety and care of her patients.  She did have a minute that she could get louder or more felt forceful when she needs to because of that issue.  Developed a visual for her to use in the situations to help calm  herself so that she does not get as agitated and and is able to confront the issues in a calm manner.  Patient went on to share she has been able to get the work done for her license which is huge.  She is doing better with her daughter and was able to set some limits with her ex-husband that hopefully will allow things to be resolved soon.  Patient was also able to realize through the discussion that the reason she got so triggered from the situation at work was once again she felt very powerless to manage other people's behaviors and felt the responsibility of everything was on her shoulders.  Patient was encouraged to remind herself of what is hers and what is others and to stay focused on those things.  Patient is to continue working on self-care as she deals with the stress of her work.  Interventions: Solution-Oriented/Positive Psychology and Insight-Oriented  Diagnosis:   ICD-10-CM   1. PTSD (post-traumatic stress disorder)  F43.10       Plan:  Patient is to use coping skills to decrease triggered responses.  Patient is to practice visuals from session to help her stay calm. patient is to work on ways to find self-care and her very busy situation.  Patient is to get back to exercising regularly.  Patient is to continue trying to focus on the positive things  that are happening in her life.  Patient is to use self spotting as needed to decrease anxiety and rumination.  Patient is to work on diaphragmatic breathing safe place exercise and stretching/yoga throughout the day.  Patient is to focus on what she can control fix and change.      Silvano Pacini, [facility]

## 2024-11-16 ENCOUNTER — Other Ambulatory Visit: Payer: Self-pay | Admitting: Internal Medicine

## 2024-11-16 ENCOUNTER — Other Ambulatory Visit (HOSPITAL_COMMUNITY): Payer: Self-pay

## 2024-11-16 ENCOUNTER — Encounter: Payer: Self-pay | Admitting: Internal Medicine

## 2024-11-16 ENCOUNTER — Other Ambulatory Visit: Payer: Self-pay

## 2024-11-16 DIAGNOSIS — G4700 Insomnia, unspecified: Secondary | ICD-10-CM

## 2024-11-16 DIAGNOSIS — M7918 Myalgia, other site: Secondary | ICD-10-CM

## 2024-11-16 DIAGNOSIS — G43909 Migraine, unspecified, not intractable, without status migrainosus: Secondary | ICD-10-CM

## 2024-11-16 DIAGNOSIS — F900 Attention-deficit hyperactivity disorder, predominantly inattentive type: Secondary | ICD-10-CM

## 2024-11-16 MED ORDER — LISDEXAMFETAMINE DIMESYLATE 50 MG PO CAPS
50.0000 mg | ORAL_CAPSULE | Freq: Every day | ORAL | 0 refills | Status: DC
  Filled 2024-11-16: qty 30, 30d supply, fill #0

## 2024-11-16 MED ORDER — TRAZODONE HCL 50 MG PO TABS: 50.0000 mg | ORAL_TABLET | Freq: Every evening | ORAL | 1 refills | Status: DC | PRN

## 2024-11-17 ENCOUNTER — Telehealth: Admitting: Neurology

## 2024-11-17 ENCOUNTER — Other Ambulatory Visit (HOSPITAL_COMMUNITY): Payer: Self-pay

## 2024-11-17 ENCOUNTER — Encounter (HOSPITAL_COMMUNITY): Payer: Self-pay

## 2024-11-17 ENCOUNTER — Other Ambulatory Visit: Payer: Self-pay | Admitting: Internal Medicine

## 2024-11-17 DIAGNOSIS — G43E09 Chronic migraine with aura, not intractable, without status migrainosus: Secondary | ICD-10-CM

## 2024-11-17 DIAGNOSIS — G43009 Migraine without aura, not intractable, without status migrainosus: Secondary | ICD-10-CM

## 2024-11-17 DIAGNOSIS — E6609 Other obesity due to excess calories: Secondary | ICD-10-CM

## 2024-11-17 DIAGNOSIS — R7302 Impaired glucose tolerance (oral): Secondary | ICD-10-CM
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
## 2024-11-17 NOTE — Patient Instructions (Signed)
 Great to see you today! Continue Vyepti  for migraine prevention Continue [name] as needed Please reach out via MyChart if you need anything. Keep follow-up appointment in May with Dr. Vear Thanks!!

## 2024-11-17 NOTE — Telephone Encounter (Signed)
 Error

## 2024-11-18 ENCOUNTER — Ambulatory Visit: Admitting: Radiology

## 2024-11-19 ENCOUNTER — Telehealth: Payer: Self-pay | Admitting: *Deleted

## 2024-11-19 NOTE — Telephone Encounter (Signed)
 Per [facility], new order is needed for Vyepti  continuation under new provider. Sarah NP last saw patient but she is not here today. I have the order form for Vyepti  300 mg IV every 3 months ready for Dr Vear to sign.

## 2024-11-19 NOTE — Telephone Encounter (Signed)
 Order signed by Dr Vear. Order given to [facility].

## 2024-11-22 ENCOUNTER — Other Ambulatory Visit: Payer: Self-pay | Admitting: Internal Medicine

## 2024-11-22 DIAGNOSIS — M7918 Myalgia, other site: Secondary | ICD-10-CM

## 2024-11-22 DIAGNOSIS — E538 Deficiency of other specified B group vitamins: Secondary | ICD-10-CM

## 2024-11-23 ENCOUNTER — Other Ambulatory Visit (HOSPITAL_COMMUNITY): Payer: Self-pay

## 2024-11-23 ENCOUNTER — Other Ambulatory Visit: Payer: Self-pay

## 2024-11-23 ENCOUNTER — Encounter (HOSPITAL_COMMUNITY): Payer: Self-pay | Admitting: Pharmacist

## 2024-11-23 MED ORDER — CYANOCOBALAMIN 1000 MCG/ML IJ SOLN
1000.0000 ug | INTRAMUSCULAR | 0 refills | Status: DC
  Filled 2024-11-23: qty 3, 90d supply, fill #0

## 2024-11-23 MED ORDER — METAXALONE 800 MG PO TABS
800.0000 mg | ORAL_TABLET | Freq: Three times a day (TID) | ORAL | 1 refills | Status: DC
  Filled 2024-11-23: qty 90, 30d supply, fill #0

## 2024-11-24 ENCOUNTER — Ambulatory Visit: Admitting: Psychiatry

## 2024-11-24 DIAGNOSIS — F4310 Post-traumatic stress disorder, unspecified: Secondary | ICD-10-CM | POA: Diagnosis not present

## 2024-11-24 NOTE — Progress Notes (Unsigned)
 [facility] Counselor/Therapist Progress Note  Patient ID: Barbara Wood, MRN: [id number],    Date: 11/24/2024  Time Spent: 35 minutes start time 4:13 PM end time 4:48 PM Virtual Visit via Video Note Connected with patient by a telemedicine/telehealth application, with their informed consent, and verified patient privacy and that I am speaking with the correct person using two identifiers. I discussed the limitations, risks, security and privacy concerns of performing psychotherapy and the availability of in person appointments. I also discussed with the patient that there may be a patient responsible charge related to this service. The patient expressed understanding and agreed to proceed. I discussed the treatment planning with the patient. The patient was provided an opportunity to ask questions and all were answered. The patient agreed with the plan and demonstrated an understanding of the instructions. The patient was advised to call  our office if  symptoms worsen or feel they are in a crisis state and need immediate contact.   Therapist Location: home Patient Location: home    Treatment Type: Individual Therapy  Reported Symptoms: triggered responses, anxiety, migraines  Mental Status Exam:  Appearance:   Well Groomed     Behavior:  Appropriate  Motor:  Normal  Speech/Language:   Normal Rate  Affect:  Appropriate  Mood:  anxious  Thought process:  normal  Thought content:    WNL  Sensory/Perceptual disturbances:    WNL  Orientation:  oriented to person, place, time/date, situation, and day of week  Attention:  Good  Concentration:  Good  Memory:  WNL  Fund of knowledge:   Good  Insight:    Good  Judgment:   Good  Impulse Control:  Good   Risk Assessment: Danger to Self:  No Self-injurious Behavior: No Danger to Others: No Duty to Warn:no Physical Aggression / Violence:No  Access to Firearms a concern: No  Gang Involvement:No   Subjective: Met with patient  via virtual session. She shared it has been a stressful day at work.  Patient discussed the different work situations and what was going on with her migraines.  Patient was encouraged to find ways to release the stress from her body appropriately since she is only able to walk a couple times a week with the change in time.  Patient was reminded that the stress she is under with her work is a lot and if she does not take care of herself the migraines will just escalate.  Discussed different ways to talk or self through work as well as the importance of working on her breathing exercises and exercise.  Patient was able to share that other areas in her life seem to be going well and her daughters seems to be doing better at home which is a positive change.  Interventions: Insight-Oriented  Diagnosis:   ICD-10-CM   1. PTSD (post-traumatic stress disorder)  F43.10       Plan: Patient is to use coping skills to decrease triggered responses.  Patient is to practice visuals  to help her stay calm. patient is to work on ways to find self-care and her very busy situation.  Patient is to get back to exercising regularly.  Patient is to continue trying to focus on the positive things that are happening in her life.  Patient is to use self spotting as needed to decrease anxiety and rumination.  Patient is to work on diaphragmatic breathing safe place exercise and stretching/yoga throughout the day.  Patient is to focus  on what she can control fix and change.      Silvano Pacini, [facility]

## 2024-12-01 ENCOUNTER — Other Ambulatory Visit (HOSPITAL_COMMUNITY): Payer: Self-pay

## 2024-12-09 ENCOUNTER — Ambulatory Visit: Admitting: Psychiatry

## 2024-12-09 DIAGNOSIS — G43909 Migraine, unspecified, not intractable, without status migrainosus: Secondary | ICD-10-CM | POA: Diagnosis not present

## 2024-12-14 ENCOUNTER — Other Ambulatory Visit (HOSPITAL_COMMUNITY): Payer: Self-pay

## 2024-12-14 ENCOUNTER — Telehealth (HOSPITAL_COMMUNITY): Payer: Self-pay | Admitting: Pharmacist

## 2024-12-14 ENCOUNTER — Other Ambulatory Visit: Payer: Self-pay | Admitting: Internal Medicine

## 2024-12-14 DIAGNOSIS — F900 Attention-deficit hyperactivity disorder, predominantly inattentive type: Secondary | ICD-10-CM

## 2024-12-14 MED ORDER — ARIPIPRAZOLE 10 MG PO TABS
10.0000 mg | ORAL_TABLET | Freq: Every day | ORAL | 0 refills | Status: DC
  Filled 2024-12-14 – 2024-12-21 (×2): qty 90, 90d supply, fill #0

## 2024-12-14 MED ORDER — LISDEXAMFETAMINE DIMESYLATE 50 MG PO CAPS: 50.0000 mg | ORAL_CAPSULE | Freq: Every day | ORAL | 0 refills | Status: AC

## 2024-12-14 MED ORDER — LISDEXAMFETAMINE DIMESYLATE 50 MG PO CAPS
50.0000 mg | ORAL_CAPSULE | Freq: Every day | ORAL | 0 refills | Status: AC
  Filled 2024-12-14 – 2024-12-15 (×2): qty 30, 30d supply, fill #0

## 2024-12-14 MED ORDER — LISDEXAMFETAMINE DIMESYLATE 50 MG PO CAPS
50.0000 mg | ORAL_CAPSULE | Freq: Every day | ORAL | 0 refills | Status: AC
  Filled 2024-12-14 – 2025-01-13 (×2): qty 30, 30d supply, fill #0
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
## 2024-12-14 NOTE — Telephone Encounter (Signed)
 PA request has been Approved. New Encounter has been or will be created for follow up. For additional info see Pharmacy Prior Auth telephone encounter from 11/04/24.

## 2024-12-15 ENCOUNTER — Other Ambulatory Visit (HOSPITAL_COMMUNITY): Payer: Self-pay

## 2024-12-16 ENCOUNTER — Ambulatory Visit: Admitting: Family Medicine

## 2024-12-16 ENCOUNTER — Other Ambulatory Visit: Payer: Self-pay | Admitting: Family Medicine

## 2024-12-16 VITALS — BP 112/82 | HR 89 | Ht 67.0 in | Wt 147.0 lb

## 2024-12-16 DIAGNOSIS — M9908 Segmental and somatic dysfunction of rib cage: Secondary | ICD-10-CM

## 2024-12-16 DIAGNOSIS — M25511 Pain in right shoulder: Secondary | ICD-10-CM

## 2024-12-16 DIAGNOSIS — M9904 Segmental and somatic dysfunction of sacral region: Secondary | ICD-10-CM | POA: Diagnosis not present

## 2024-12-16 DIAGNOSIS — M25512 Pain in left shoulder: Secondary | ICD-10-CM

## 2024-12-16 DIAGNOSIS — M9901 Segmental and somatic dysfunction of cervical region: Secondary | ICD-10-CM | POA: Diagnosis not present

## 2024-12-16 DIAGNOSIS — M9902 Segmental and somatic dysfunction of thoracic region: Secondary | ICD-10-CM

## 2024-12-16 DIAGNOSIS — M9903 Segmental and somatic dysfunction of lumbar region: Secondary | ICD-10-CM

## 2024-12-16 NOTE — Patient Instructions (Addendum)
 MRI R shoulder Bonni See me again in January

## 2024-12-16 NOTE — Assessment & Plan Note (Signed)
 Right shoulder exam does have more pain th anticipated.  Patient should be back to her baseline and is not.  Patient's MRI of the cervical spine showed some adjacent segment disease from her surgery but would be more left than right.  With the right and how patient is responding concerned that there could be some worsening [facility] pathology or rotator cuff pathology.  Feel imaging in advance is necessary with this affecting daily activities and sleep.  Follow-up with me again 6 to 8 weeks

## 2024-12-20 ENCOUNTER — Other Ambulatory Visit

## 2024-12-20 DIAGNOSIS — M25511 Pain in right shoulder: Secondary | ICD-10-CM | POA: Diagnosis not present

## 2024-12-21 ENCOUNTER — Other Ambulatory Visit (HOSPITAL_COMMUNITY): Payer: Self-pay

## 2024-12-22 ENCOUNTER — Other Ambulatory Visit (HOSPITAL_COMMUNITY): Payer: Self-pay

## 2024-12-22 ENCOUNTER — Encounter: Payer: Self-pay | Admitting: Internal Medicine

## 2024-12-22 ENCOUNTER — Ambulatory Visit: Admitting: Internal Medicine

## 2024-12-22 ENCOUNTER — Ambulatory Visit: Admitting: Psychiatry

## 2024-12-22 ENCOUNTER — Other Ambulatory Visit: Payer: Self-pay

## 2024-12-22 VITALS — BP 110/80 | HR 74 | Temp 97.8°F | Wt 146.0 lb

## 2024-12-22 DIAGNOSIS — E063 Autoimmune thyroiditis: Secondary | ICD-10-CM

## 2024-12-22 DIAGNOSIS — G43119 Migraine with aura, intractable, without status migrainosus: Secondary | ICD-10-CM

## 2024-12-22 DIAGNOSIS — G4700 Insomnia, unspecified: Secondary | ICD-10-CM | POA: Diagnosis not present

## 2024-12-22 DIAGNOSIS — J4540 Moderate persistent asthma, uncomplicated: Secondary | ICD-10-CM | POA: Diagnosis not present

## 2024-12-22 DIAGNOSIS — E039 Hypothyroidism, unspecified: Secondary | ICD-10-CM | POA: Diagnosis not present

## 2024-12-22 DIAGNOSIS — F4310 Post-traumatic stress disorder, unspecified: Secondary | ICD-10-CM

## 2024-12-22 DIAGNOSIS — M7918 Myalgia, other site: Secondary | ICD-10-CM | POA: Diagnosis not present

## 2024-12-22 DIAGNOSIS — M79643 Pain in unspecified hand: Secondary | ICD-10-CM | POA: Diagnosis not present

## 2024-12-22 DIAGNOSIS — E538 Deficiency of other specified B group vitamins: Secondary | ICD-10-CM

## 2024-12-22 DIAGNOSIS — N951 Menopausal and female climacteric states: Secondary | ICD-10-CM | POA: Diagnosis not present

## 2024-12-22 DIAGNOSIS — G43909 Migraine, unspecified, not intractable, without status migrainosus: Secondary | ICD-10-CM | POA: Diagnosis not present

## 2024-12-22 DIAGNOSIS — R7302 Impaired glucose tolerance (oral): Secondary | ICD-10-CM

## 2024-12-22 DIAGNOSIS — B0229 Other postherpetic nervous system involvement: Secondary | ICD-10-CM

## 2024-12-22 DIAGNOSIS — K219 Gastro-esophageal reflux disease without esophagitis: Secondary | ICD-10-CM

## 2024-12-22 LAB — POCT GLYCOSYLATED HEMOGLOBIN (HGB A1C): Hemoglobin A1C: 5.4 % (ref 4.0–5.6)

## 2024-12-22 LAB — TSH: TSH: 0.83 u[IU]/mL (ref 0.35–5.50)

## 2024-12-22 MED ORDER — CYANOCOBALAMIN 1000 MCG/ML IJ SOLN
1000.0000 ug | INTRAMUSCULAR | 0 refills | Status: AC
  Filled 2024-12-22: qty 3, 90d supply, fill #0

## 2024-12-22 MED ORDER — ARIPIPRAZOLE 10 MG PO TABS: 10.0000 mg | ORAL_TABLET | Freq: Every day | ORAL | 0 refills | Status: AC

## 2024-12-22 MED ORDER — MECLIZINE HCL 12.5 MG PO TABS
12.5000 mg | ORAL_TABLET | Freq: Three times a day (TID) | ORAL | 11 refills | Status: AC | PRN
  Filled 2024-12-22: qty 10, 4d supply, fill #0

## 2024-12-22 MED ORDER — OMEPRAZOLE 20 MG PO CPDR
20.0000 mg | DELAYED_RELEASE_CAPSULE | Freq: Every day | ORAL | 3 refills | Status: AC
  Filled 2024-12-22: qty 30, 30d supply, fill #0

## 2024-12-22 MED ORDER — TRAMADOL HCL 50 MG PO TABS
100.0000 mg | ORAL_TABLET | Freq: Two times a day (BID) | ORAL | 0 refills | Status: AC
  Filled 2024-12-22 – 2024-12-29 (×3): qty 360, 90d supply, fill #0

## 2024-12-22 MED ORDER — TIRZEPATIDE-WEIGHT MANAGEMENT 15 MG/0.5ML ~~LOC~~ SOAJ: 15.0000 mg | SUBCUTANEOUS | 0 refills | Status: AC

## 2024-12-22 MED ORDER — LIDOCAINE 5 % EX PTCH
1.0000 | MEDICATED_PATCH | CUTANEOUS | 2 refills | Status: AC
  Filled 2024-12-22 (×2): qty 30, 30d supply, fill #0

## 2024-12-22 MED ORDER — DICLOFENAC SODIUM 75 MG PO TBEC
75.0000 mg | DELAYED_RELEASE_TABLET | Freq: Two times a day (BID) | ORAL | 0 refills | Status: AC
  Filled 2024-12-22: qty 60, 30d supply, fill #0

## 2024-12-22 MED ORDER — METAXALONE 800 MG PO TABS
800.0000 mg | ORAL_TABLET | Freq: Three times a day (TID) | ORAL | 1 refills | Status: AC
  Filled 2024-12-22: qty 90, 30d supply, fill #0

## 2024-12-22 MED ORDER — BACLOFEN 5 MG PO TABS
5.0000 mg | ORAL_TABLET | Freq: Three times a day (TID) | ORAL | 3 refills | Status: DC | PRN
  Filled 2024-12-22: qty 180, 30d supply, fill #0

## 2024-12-22 MED ORDER — BUDESONIDE-FORMOTEROL FUMARATE 160-4.5 MCG/ACT IN AERO
2.0000 | INHALATION_SPRAY | Freq: Two times a day (BID) | RESPIRATORY_TRACT | 3 refills | Status: AC
  Filled 2024-12-22: qty 10.2, 30d supply, fill #0

## 2024-12-22 MED ORDER — MONTELUKAST SODIUM 10 MG PO TABS
10.0000 mg | ORAL_TABLET | Freq: Every day | ORAL | 1 refills | Status: AC
  Filled 2024-12-22 – 2025-01-12 (×2): qty 90, 90d supply, fill #0

## 2024-12-22 MED ORDER — TRAZODONE HCL 50 MG PO TABS
50.0000 mg | ORAL_TABLET | Freq: Every evening | ORAL | 1 refills | Status: AC | PRN
  Filled 2024-12-22 – 2025-01-04 (×2): qty 180, 90d supply, fill #0

## 2024-12-22 MED ORDER — ESTRADIOL 1 MG PO TABS
1.0000 mg | ORAL_TABLET | Freq: Every day | ORAL | 1 refills | Status: DC
  Filled 2024-12-22 – 2024-12-29 (×2): qty 90, 90d supply, fill #0
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
## 2024-12-22 NOTE — Progress Notes (Signed)
 "    Established Patient Office Visit     CC/Reason for Visit: Follow-up chronic conditions  HPI: Barbara Wood is a 54 y.o. female who is coming in today for the above mentioned reasons. Past Medical History is significant for: DHD on stable dose of Vyvanse  for approximately 9 years, she has a very significant history of migraines.  She has myofascial pain and cervical disc disease gets OMT by Dr. Zack Smith she takes tramadol  daily and has a very sporadic prescription for Norco for migraine relief when the [name] and Emgality  do not take care of it.  She also has a history of impaired glucose tolerance.  She also has hypothyroidism.   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  also has asthma.  Is requesting her TSH checked and multiple medications refilled.   Past Medical/Surgical History: Past Medical History:  Diagnosis Date   Allergy Age 30   Arthritis    shoulders, rt hip   Asthma Since age 89   Depression Age 58   Family history of adverse reaction to anesthesia    mother and sister   GERD (gastroesophageal reflux disease)    Hashimoto's disease    Heart murmur Mitral valve prolapse age 72   Migraine    PONV (postoperative nausea and vomiting)    Pre-diabetes    Psoriasis    Thyroid  disease Since age 89    Past Surgical History:  Procedure Laterality Date   BREAST SURGERY  Reduction age 85   CERVICAL DISC ARTHROPLASTY N/A 11/27/2022   Procedure: ARTIFICIAL DISC REPLACEMENT Cervical five-six;  Surgeon: Carollee Lani BROCKS, DO;  Location: [facility] OR;  Service: Neurosurgery;  Laterality: N/A;   CHOLECYSTECTOMY  Age 39   KNEE ARTHROSCOPY W/ LATERAL RELEASE Right    REDUCTION MAMMAPLASTY     WISDOM TOOTH EXTRACTION      Social History:  reports that she has never smoked. She has never been exposed to tobacco smoke. She has never used smokeless tobacco. She reports current alcohol use. She reports current drug use.  Allergies: Allergies[1]  Family History:  Family History  Problem  Relation Age of Onset   Cancer Mother    Depression Mother    Hashimoto's thyroiditis Mother    Diabetes Mother    Bipolar disorder Mother    Hypertension Father    Ankylosing spondylitis Father    Psoriasis Father    Asthma Sister    Miscarriages / Stillbirths Sister    Depression Sister    Psoriasis Sister    Psoriasis Sister    Arthritis Sister        psoriatic arthritis    Psoriasis Sister    Rheum arthritis Sister    Depression Sister    Anxiety disorder Sister    Eczema Sister    Psoriasis Brother    Migraines Brother    ADD / ADHD Brother    Eczema Brother    Allergies Brother    Psoriasis Brother    Psoriasis Maternal Aunt    ADD / ADHD Daughter    Eczema Daughter    Asthma Daughter    Hypermobility Daughter     Current Medications[2]  Review of Systems:  Negative unless indicated in HPI.   Physical Exam: Vitals:   12/22/24 0936  BP: 110/80  Pulse: 74  Temp: 97.8 F (36.6 C)  TempSrc: Oral  SpO2: 95%  Weight: 146 lb (66.2 kg)    Body mass index is 22.87 kg/m.   Physical Exam Vitals reviewed.  Constitutional:      Appearance: Normal appearance.  HENT:     Head: Normocephalic and atraumatic.  Eyes:     Conjunctiva/sclera: Conjunctivae normal.     Pupils: Pupils are equal, round, and reactive to light.  Cardiovascular:     Rate and Rhythm: Normal rate and regular rhythm.  Pulmonary:     Effort: Pulmonary effort is normal.     Breath sounds: Normal breath sounds.  Skin:    General: Skin is warm and dry.  Neurological:     General: No focal deficit present.     Mental Status: She is alert and oriented to person, place, and time.  Psychiatric:        Mood and Affect: Mood normal.        Behavior: Behavior normal.        Thought Content: Thought content normal.        Judgment: Judgment normal.      Impression and Plan:  IGT (impaired glucose tolerance) -     POCT glycosylated hemoglobin (Hb A1C) -     Tirzepatide -Weight  Management; Inject 15 mg into the skin once a week.  Dispense: 2 mL; Refill: 0  Insomnia, unspecified type -     traZODone  HCl; Take 1-2 tablets (50-100 mg total) by mouth at bedtime as needed for sleep.  Dispense: 180 tablet; Refill: 1  Moderate persistent asthma without complication -     Budesonide -Formoterol  Fumarate; Inhale 2 puffs into the lungs 2 (two) times daily.  Dispense: 10.2 g; Refill: 3 -     Montelukast  Sodium; Take 1 tablet (10 mg total) by mouth at bedtime.  Dispense: 90 tablet; Refill: 1  B12 deficiency -     Cyanocobalamin ; Inject 1 mL (1,000 mcg total) into the muscle every 30 (thirty) days.  Dispense: 3 mL; Refill: 0  Vasomotor symptoms due to menopause -     Estradiol ; Take 1 tablet (1 mg total) by mouth daily.  Dispense: 90 tablet; Refill: 1  Intermittent pain and swelling of hand -     Baclofen ; Take 1-2 tablets (5-10 mg total) by mouth 3 (three) times daily as needed.  Dispense: 180 tablet; Refill: 3 -     Diclofenac  Sodium; Take 1 tablet (75 mg total) by mouth 2 (two) times daily.  Dispense: 60 tablet; Refill: 0 -     Lidocaine ; Place 1 patch onto the skin daily. Remove & Discard patch within 12 hours or as directed by MD  Dispense: 30 patch; Refill: 2  Postherpetic neuralgia -     Lidocaine ; Place 1 patch onto the skin daily. Remove & Discard patch within 12 hours or as directed by MD  Dispense: 30 patch; Refill: 2  Intractable migraine with aura without status migrainosus -     Meclizine  HCl; Take 1 tablet (12.5 mg total) by mouth 3 (three) times daily as needed for dizziness.  Dispense: 10 tablet; Refill: 11  Myofascial pain -     ARIPiprazole ; Take 1 tablet (10 mg total) by mouth daily.  Dispense: 90 tablet; Refill: 0 -     Metaxalone ; Take 1 tablet (800 mg total) by mouth 3 (three) times daily.  Dispense: 90 tablet; Refill: 1 -     traMADol  HCl; Take 2 tablets (100 mg total) by mouth 2 (two) times daily.  Dispense: 360 tablet; Refill: 0  Migraine without  status migrainosus, not intractable, unspecified migraine type -     traMADol  HCl; Take 2 tablets (100 mg total)  by mouth 2 (two) times daily.  Dispense: 360 tablet; Refill: 0  Hashimoto's disease -     TSH; Future  Acquired hypothyroidism  Gastroesophageal reflux disease, unspecified whether esophagitis present -     Omeprazole ; Take 1 capsule (20 mg total) by mouth daily.  Dispense: 30 capsule; Refill: 3  -All medical diseases stable, multiple medications have been refilled. - Check TSH to make sure it has normalized.   Time spent:32 minutes reviewing chart, interviewing and examining patient and formulating plan of care.     Tully Theophilus Andrews, MD [facility]     [1]  Allergies Allergen Reactions   Amoxicillin-Pot Clavulanate Nausea And Vomiting    Augmentin causes nausea and vomiting   Sertraline Other (See Comments)    hallucinations    Sulfamethoxazole-Trimethoprim Hives   Trimethoprim    Triptans Other (See Comments)    Chest pain   [2]  Current Outpatient Medications:    albuterol  (PROAIR  HFA) 108 (90 Base) MCG/ACT inhaler, Inhale 2 puffs into the lungs every 6 (six) hours as needed for wheezing or shortness of breath., Disp: 6.7 g, Rfl: 2   albuterol  (VENTOLIN  HFA) 108 (90 Base) MCG/ACT inhaler, Inhale 2 puffs into the lungs every 6 (six) hours as needed for wheezing or shortness of breath, Disp: 6.7 g, Rfl: 1   atenolol  (TENORMIN ) 25 MG tablet, Take 2 tablets (50 mg total) by mouth daily., Disp: 180 tablet, Rfl: 1   betamethasone  dipropionate (DIPROLENE ) 0.05 % ointment, Use as directed to skin every night to affected areas on hands as needed., Disp: 90 g, Rfl: 6   betamethasone  dipropionate 0.05 % cream, Apply topically 2 (two) times daily., Disp: 45 g, Rfl: 5   betamethasone  dipropionate 0.05 % lotion, Apply 1 application topically to the scalp daily. Taper use as able., Disp: 120 mL, Rfl: 3   betamethasone  dipropionate 0.05 %  lotion, Apply as directed to affected area of scalp once daily as needed with flares., Disp: 60 mL, Rfl: 11   Budesonide  (RHINOCORT  ALLERGY NA), Place 1 spray into both nostrils 2 (two) times daily., Disp: , Rfl:    cephALEXin  (KEFLEX ) 500 MG capsule, Take 1 capsule (500 mg total) by mouth 2 (two) times daily., Disp: 14 capsule, Rfl: 0   cetirizine (ZYRTEC) 10 MG tablet, Take 1 tablet by mouth daily., Disp: , Rfl:    Cholecalciferol (VITAMIN D3) 250 MCG (10000 UT) capsule, Take 10,000 Units by mouth daily., Disp: , Rfl:    HYDROcodone -acetaminophen  (NORCO/VICODIN) 5-325 MG tablet, Take 1 tablet by mouth every 6 (six) hours as needed for pain., Disp: 30 tablet, Rfl: 0   HYDROcodone -acetaminophen  (NORCO/VICODIN) 5-325 MG tablet, Take 1 tablet by mouth every 6 (six) hours as needed for pain, Disp: 60 tablet, Rfl: 0   HYDROcodone -acetaminophen  (NORCO/VICODIN) 5-325 MG tablet, Take 1 tablet by mouth every 6 (six) hours as needed for pain., Disp: 60 tablet, Rfl: 0   levothyroxine  (SYNTHROID ) 88 MCG tablet, Take 1 tablet (88 mcg total) by mouth daily., Disp: 90 tablet, Rfl: 1   liothyronine  (CYTOMEL ) 5 MCG tablet, Take 1 tablet (5 mcg total) by mouth daily., Disp: 90 tablet, Rfl: 1   lisdexamfetamine  (VYVANSE ) 50 MG capsule, Take 1 capsule (50 mg total) by mouth daily., Disp: 30 capsule, Rfl: 0   [START ON 01/11/2025] lisdexamfetamine  (VYVANSE ) 50 MG capsule, Take 1 capsule (50 mg total) by mouth daily. (Oct rx), Disp: 30 capsule, Rfl: 0   lisdexamfetamine  (VYVANSE ) 50 MG capsule, Take  1 capsule (50 mg total) by mouth daily, Disp: 30 capsule, Rfl: 0   NON FORMULARY, Vyepti  infusions for migraine, Disp: , Rfl:    nystatin  (MYCOSTATIN ) 100000 UNIT/ML suspension, Take 5 mLs (500,000 Units total) by mouth 4 (four) times daily., Disp: 60 mL, Rfl: 0   progesterone  (PROMETRIUM ) 100 MG capsule, Take 1 capsule (100 mg total) by mouth daily., Disp: 90 capsule, Rfl: 4   promethazine  (PHENERGAN ) 25 MG tablet, Take 1  tablet (25 mg total) by mouth every 6 (six) hours as needed for nausea or vomiting., Disp: 30 tablet, Rfl: 3   Rimegepant Sulfate  ([name]) 75 MG TBDP, Take 1 tablet (75 mg total) by mouth daily as needed (migraine). Max dose 1 tablet in 24 hours., Disp: 8 tablet, Rfl: 5   sodium chloride  0.9 % SOLN 100 mL with Eptinezumab -jjmr 100 MG/ML SOLN 300 mg, Inject 300 mg into the vein every 3 (three) months., Disp: 300 mg, Rfl: 4   tirzepatide  (ZEPBOUND ) 15 MG/0.5ML Pen, Inject 15 mg into the skin once a week., Disp: 2 mL, Rfl: 0   triamcinolone  cream (KENALOG ) 0.1 %, Apply 1 Application topically 2 (two) times daily., Disp: 454 g, Rfl: 2   ARIPiprazole  (ABILIFY ) 10 MG tablet, Take 1 tablet (10 mg total) by mouth daily., Disp: 90 tablet, Rfl: 0   Baclofen  5 MG TABS, Take 1-2 tablets (5-10 mg total) by mouth 3 (three) times daily as needed., Disp: 180 tablet, Rfl: 3   budesonide -formoterol  (SYMBICORT ) 160-4.5 MCG/ACT inhaler, Inhale 2 puffs into the lungs 2 (two) times daily., Disp: 10.2 g, Rfl: 3   conjugated estrogens  (PREMARIN ) vaginal cream, Place 0.5 g vaginally 3 (three) times a week. (Patient not taking: Reported on 12/22/2024), Disp: 30 g, Rfl: 12   cyanocobalamin  (VITAMIN B12) 1000 MCG/ML injection, Inject 1 mL (1,000 mcg total) into the muscle every 30 (thirty) days., Disp: 3 mL, Rfl: 0   diclofenac  (VOLTAREN ) 75 MG EC tablet, Take 1 tablet (75 mg total) by mouth 2 (two) times daily., Disp: 60 tablet, Rfl: 0   estradiol  (ESTRACE ) 1 MG tablet, Take 1 tablet (1 mg total) by mouth daily., Disp: 90 tablet, Rfl: 1   lidocaine  (LIDODERM ) 5 %, Place 1 patch onto the skin daily. Remove & Discard patch within 12 hours or as directed by MD, Disp: 30 patch, Rfl: 2   meclizine  (ANTIVERT ) 12.5 MG tablet, Take 1 tablet (12.5 mg total) by mouth 3 (three) times daily as needed for dizziness., Disp: 10 tablet, Rfl: 11   metaxalone  (SKELAXIN ) 800 MG tablet, Take 1 tablet (800 mg total) by mouth 3 (three) times daily.,  Disp: 90 tablet, Rfl: 1   montelukast  (SINGULAIR ) 10 MG tablet, Take 1 tablet (10 mg total) by mouth at bedtime., Disp: 90 tablet, Rfl: 1   omeprazole  (PRILOSEC) 20 MG capsule, Take 1 capsule (20 mg total) by mouth daily., Disp: 30 capsule, Rfl: 3   traMADol  (ULTRAM ) 50 MG tablet, Take 2 tablets (100 mg total) by mouth 2 (two) times daily., Disp: 360 tablet, Rfl: 0   traZODone  (DESYREL ) 50 MG tablet, Take 1-2 tablets (50-100 mg total) by mouth at bedtime as needed for sleep., Disp: 180 tablet, Rfl: 1  "

## 2024-12-29 ENCOUNTER — Ambulatory Visit: Payer: Self-pay | Admitting: Internal Medicine

## 2024-12-29 ENCOUNTER — Other Ambulatory Visit (HOSPITAL_COMMUNITY): Payer: Self-pay

## 2024-12-30 ENCOUNTER — Encounter: Payer: Self-pay | Admitting: Radiology

## 2024-12-30 ENCOUNTER — Other Ambulatory Visit (HOSPITAL_COMMUNITY): Payer: Self-pay

## 2024-12-30 ENCOUNTER — Other Ambulatory Visit (HOSPITAL_COMMUNITY)
Admission: RE | Admit: 2024-12-30 | Discharge: 2024-12-30 | Disposition: A | Discharge status: Home / Self Care | Source: Ambulatory Visit | Attending: Radiology | Admitting: Radiology

## 2024-12-30 ENCOUNTER — Ambulatory Visit: Admitting: Radiology

## 2024-12-30 VITALS — BP 110/78 | HR 84 | Ht 66.5 in | Wt 144.0 lb

## 2024-12-30 DIAGNOSIS — Z79890 Hormone replacement therapy (postmenopausal): Secondary | ICD-10-CM

## 2024-12-30 DIAGNOSIS — Z01419 Encounter for gynecological examination (general) (routine) without abnormal findings: Secondary | ICD-10-CM

## 2024-12-30 DIAGNOSIS — N958 Other specified menopausal and perimenopausal disorders: Secondary | ICD-10-CM

## 2024-12-30 DIAGNOSIS — Z1331 Encounter for screening for depression: Secondary | ICD-10-CM

## 2024-12-30 MED ORDER — PROGESTERONE MICRONIZED 100 MG PO CAPS
100.0000 mg | ORAL_CAPSULE | Freq: Every evening | ORAL | 4 refills | Status: AC
  Filled 2024-12-30: qty 90, 90d supply, fill #0

## 2024-12-30 MED ORDER — ESTRADIOL 0.1 MG/24HR TD PTTW
1.0000 | MEDICATED_PATCH | TRANSDERMAL | 4 refills | Status: AC
  Filled 2024-12-30: qty 24, 84d supply, fill #0

## 2024-12-30 MED ORDER — ESTRADIOL 10 MCG VA TABS
1.0000 | ORAL_TABLET | VAGINAL | 4 refills | Status: AC
  Filled 2024-12-30: qty 24, 84d supply, fill #0

## 2024-12-30 NOTE — Progress Notes (Signed)
 "  Barbara Wood [birth year] [id number]   History: Postmenopausal 54 y.o. presents for annual exam. Was started on oral estrogen to accompany nightly progesterone . Stopped vaginal premarin . C/o fissures of vulva at times. Also c/o bladder pressure and spasms with a full bladder or sitting. Started on a GLP-1 this year, has lost 60lbs.   Gynecologic History Postmenopausal Last Pap: 2022. Results were: normal Last mammogram: 07/15/24. Results were: normal Last colonoscopy: 2016   Obstetric History OB History  Gravida Para Term Preterm AB Living  1 1    1   SAB IAB Ectopic Multiple Live Births      1    # Outcome Date GA Lbr Len/2nd Weight Sex Type Anes PTL Lv  1 Para                12/30/2024    1:36 PM 01/08/2024    2:35 PM 06/20/2023    3:08 PM  Depression screen PHQ 2/9  Decreased Interest 0 1 1  Down, Depressed, Hopeless 0 1 1  PHQ - 2 Score 0 2 2  Altered sleeping  2 1  Tired, decreased energy  2 1  Change in appetite  2 2  Feeling bad or failure about yourself   1 1  Trouble concentrating  1 0  Moving slowly or fidgety/restless  1 0  Suicidal thoughts  0 0  PHQ-9 Score  11  7      Data saved with a previous flowsheet row definition     The following portions of the patient's history were reviewed and updated as appropriate: allergies, current medications, past family history, past medical history, past social history, past surgical history, and problem list.  Review of Systems Pertinent items noted in HPI and remainder of comprehensive ROS otherwise negative.  Past medical history, past surgical history, family history and social history were all reviewed and documented in the EPIC chart.  Exam:  Vitals:   12/30/24 1326  BP: 110/78  Pulse: 84  SpO2: 99%  Weight: 144 lb (65.3 kg)  Height: 5' 6.5 (1.689 m)   Body mass index is 22.89 kg/m.  General appearance:  Normal Thyroid :  Symmetrical, normal in size, without palpable masses or  nodularity. Respiratory  Auscultation:  Clear without wheezing or rhonchi Cardiovascular  Auscultation:  Regular rate, without rubs, murmurs or gallops  Edema/varicosities:  Not grossly evident Abdominal  Soft,nontender, without masses, guarding or rebound.  Liver/spleen:  No organomegaly noted  Hernia:  None appreciated  Skin  Inspection:  Grossly normal Breasts: Examined lying and sitting.   Right: Without masses, retractions, nipple discharge or axillary adenopathy.   Left: Without masses, retractions, nipple discharge or axillary adenopathy. Genitourinary   Inguinal/mons:  Normal without inguinal adenopathy  External genitalia:  Normal appearing vulva with no masses, tenderness, or lesions  BUS/Urethra/Skene's glands:  Normal  Vagina:  Normal appearing with normal color and discharge, no lesions. Atrophy: moderate   Cervix:  Normal appearing without discharge or lesions  Uterus:  Normal in size, shape and contour.  Midline and mobile, nontender  Adnexa/parametria:     Rt: Normal in size, without masses or tenderness.   Lt: Normal in size, without masses or tenderness.  Anus and perineum: Normal    Darice Hoit, CMA present for exam  Assessment/Plan:   1. Well woman exam with routine gynecological exam (Primary) - Cytology - PAP( [facility])  2. Postmenopausal HRT (hormone replacement therapy) Will switch to transdermal method to decrease risks. - estradiol  (  DOTTI ) 0.1 MG/24HR patch; Place 1 patch (0.1 mg total) onto the skin 2 (two) times a week.  Dispense: 24 patch; Refill: 4 - progesterone  (PROMETRIUM ) 100 MG capsule; Take 1 capsule (100 mg total) by mouth at bedtime.  Dispense: 90 capsule; Refill: 4  3. Genitourinary syndrome of menopause - Estradiol  10 MCG TABS vaginal tablet; Place 1 tablet (10 mcg total) vaginally 2 (two) times a week.  Dispense: 24 tablet; Refill: 4  4. Depression screen    Return in 1 year for annual or sooner prn.  Jermal Dismuke B  WHNP-BC, 1:54 PM 12/30/2024 "

## 2025-01-04 ENCOUNTER — Other Ambulatory Visit: Payer: Self-pay

## 2025-01-04 ENCOUNTER — Other Ambulatory Visit (HOSPITAL_COMMUNITY): Payer: Self-pay

## 2025-01-04 DIAGNOSIS — M7989 Other specified soft tissue disorders: Secondary | ICD-10-CM

## 2025-01-05 ENCOUNTER — Ambulatory Visit: Payer: Self-pay | Admitting: Family Medicine

## 2025-01-05 ENCOUNTER — Other Ambulatory Visit (HOSPITAL_COMMUNITY): Payer: Self-pay

## 2025-01-05 LAB — CYTOLOGY - PAP
Comment: NEGATIVE
Comment: NEGATIVE
Comment: NEGATIVE
HPV 16: NEGATIVE
HPV 18 / 45: NEGATIVE
High risk HPV: POSITIVE — AB

## 2025-01-05 MED ORDER — BACLOFEN 5 MG PO TABS
5.0000 mg | ORAL_TABLET | Freq: Three times a day (TID) | ORAL | 3 refills | Status: AC | PRN
  Filled 2025-01-05: qty 180, 30d supply, fill #0

## 2025-01-06 ENCOUNTER — Ambulatory Visit: Payer: Self-pay | Admitting: Radiology

## 2025-01-06 ENCOUNTER — Ambulatory Visit: Admitting: Psychiatry

## 2025-01-06 DIAGNOSIS — B977 Papillomavirus as the cause of diseases classified elsewhere: Secondary | ICD-10-CM

## 2025-01-06 DIAGNOSIS — F4310 Post-traumatic stress disorder, unspecified: Secondary | ICD-10-CM

## 2025-01-06 DIAGNOSIS — R87612 Low grade squamous intraepithelial lesion on cytologic smear of cervix (LGSIL): Secondary | ICD-10-CM

## 2025-01-06 NOTE — Progress Notes (Signed)
 "       [facility] Counselor/Therapist Progress Note  Patient ID: Barbara Wood, MRN: [id number],    Date: 01/06/2025  Time Spent: 54 minutes start time 1:03 PM end time 1:57 PM  Treatment Type: Individual Therapy  Reported Symptoms: anxiety, triggered responses, irritability  Mental Status Exam:  Appearance:   Well Groomed     Behavior:  Appropriate  Motor:  Normal  Speech/Language:   Normal Rate  Affect:  Appropriate  Mood:  anxious  Thought process:  normal  Thought content:    WNL  Sensory/Perceptual disturbances:    WNL  Orientation:  oriented to person, place, time/date, and situation  Attention:  Good  Concentration:  Good  Memory:  WNL  Fund of knowledge:   Good  Insight:    Good  Judgment:   Good  Impulse Control:  Good   Risk Assessment: Danger to Self:  No Self-injurious Behavior: No Danger to Others: No Duty to Warn:no Physical Aggression / Violence:No  Access to Firearms a concern: No  Gang Involvement:No   Subjective: Patient was present for session. She shared that things are still stressful with her daughter's concussion.  She went on to share that it is even more frustrating because she is uncertain as to what are the biggest issues for her daughter.  She explained her daughter is still not always doing the things that she is supposed to do and after she is told her daughter multiple times she starts getting upset and may yell.  She also shared that her daughter told her that she does not really listen until she yells.  Patient explained that that is all very triggering for her because she never wanted things to be even close to her the way it was when she was growing up.  She shared that if she did not do what her mother wanted  her to do at that moment which could change at any point depending upon her mother's moods and she would just be hit.  She shared she never hits her child but yelling seems to upset her and that is triggering for her as well because she  it triggers things that she went through as a child.Did processing set on issues with daughter. Picture Yelling at her cognition I'm out of control SUDS level 9 felt anger in her nose and shoulders. Patient was able to reduce SUDS level to 3.  Through the processing patient was able to recognize that she is not her mom.  She was also able to think through the importance of her releasing all the stress that she is feeling on a more regular basis.  She is also going to give consequences faster to her daughter while she is still in a very calm manner and tried to help her see the importance of responding to patient when she is asked to do something on the first time or at least the second.  She was able to realize that her ex-husband was really bad about ignoring and still is bad about ignoring everyone and so her daughter has learned that behavior but she wants to help her learn more effective choices.  Interventions: Eye Movement Desensitization and Reprocessing (EMDR) and Insight-Oriented  Diagnosis:   ICD-10-CM   1. PTSD (post-traumatic stress disorder)  F43.10       Plan: Patient is to use coping skills to decrease triggered responses.  Patient is to work on ways to find self-care more consistently rather than letting things build  to the poor frustration.  Patient is to set quicker limits with her daughter especially when she is not listening to her to try and decrease triggered responses.  Patient is to get back to exercising regularly.  Patient is to continue trying to focus on the positive things that are happening in her life.  Patient is to use self spotting as needed to decrease anxiety and rumination.  Patient is to work on diaphragmatic breathing safe place exercise and stretching/yoga throughout the day.  Patient is to focus on what she can control fix and change.      Silvano Pacini, [facility]                   "

## 2025-01-12 ENCOUNTER — Other Ambulatory Visit (HOSPITAL_COMMUNITY): Payer: Self-pay

## 2025-01-13 ENCOUNTER — Other Ambulatory Visit (HOSPITAL_COMMUNITY): Payer: Self-pay

## 2025-01-20 ENCOUNTER — Telehealth: Admitting: Neurology

## 2025-02-01 ENCOUNTER — Other Ambulatory Visit: Payer: Self-pay | Admitting: Internal Medicine

## 2025-02-01 ENCOUNTER — Other Ambulatory Visit (HOSPITAL_COMMUNITY): Payer: Self-pay

## 2025-02-01 DIAGNOSIS — E039 Hypothyroidism, unspecified: Secondary | ICD-10-CM

## 2025-02-02 ENCOUNTER — Other Ambulatory Visit (HOSPITAL_COMMUNITY): Payer: Self-pay

## 2025-02-02 ENCOUNTER — Ambulatory Visit: Admitting: Psychiatry

## 2025-02-02 DIAGNOSIS — F4310 Post-traumatic stress disorder, unspecified: Secondary | ICD-10-CM

## 2025-02-02 MED ORDER — LIOTHYRONINE SODIUM 5 MCG PO TABS
5.0000 ug | ORAL_TABLET | Freq: Every day | ORAL | 1 refills | Status: AC
  Filled 2025-02-02 (×2): qty 90, 90d supply, fill #0

## 2025-02-02 NOTE — Progress Notes (Unsigned)
"   °      [facility] Counselor/Therapist Progress Note  Patient ID: Barbara Wood, MRN: [id number],    Date: 02/02/2025  Time Spent: 31 minutes start 3:59 AM end time 4:30 PM Virtual Visit via Video Note Connected with patient by a telemedicine/telehealth application, with their informed consent, and verified patient privacy and that I am speaking with the correct person using two identifiers. I discussed the limitations, risks, security and privacy concerns of performing psychotherapy and the availability of in person appointments. I also discussed with the patient that there may be a patient responsible charge related to this service. The patient expressed understanding and agreed to proceed. I discussed the treatment planning with the patient. The patient was provided an opportunity to ask questions and all were answered. The patient agreed with the plan and demonstrated an understanding of the instructions. The patient was advised to call  our office if  symptoms worsen or feel they are in a crisis state and need immediate contact.   Therapist Location: home Patient Location: home    Treatment Type: Individual Therapy  Reported Symptoms: anxiety, triggered responses, fatigue  Mental Status Exam:  Appearance:   Well Groomed     Behavior:  Appropriate  Motor:  Normal  Speech/Language:   Normal Rate  Affect:  Appropriate  Mood:  anxious  Thought process:  normal  Thought content:    WNL  Sensory/Perceptual disturbances:    Migraine  Orientation:  oriented to person, place, time/date, and situation  Attention:  Good  Concentration:  Good  Memory:  WNL  Fund of knowledge:   Good  Insight:    Good  Judgment:   Good  Impulse Control:  Good   Risk Assessment: Danger to Self:  No Self-injurious Behavior: No Danger to Others: No Duty to Warn:no Physical Aggression / Violence:No  Access to Firearms a concern: No  Gang Involvement:No   Subjective: Met with patient via virtual  session. She shared that her daughter may be having seizures.  She is in the process of testing so patient is hopeful they will know what is going on with her soon.  Interventions: {[phone]}  Diagnosis:   ICD-10-CM   1. PTSD (post-traumatic stress disorder)  F43.10       Plan: ***  Silvano Pacini, [facility]                   "

## 2025-02-04 ENCOUNTER — Other Ambulatory Visit (HOSPITAL_COMMUNITY): Payer: Self-pay

## 2025-02-10 ENCOUNTER — Ambulatory Visit: Admitting: Family Medicine

## 2025-02-18 ENCOUNTER — Ambulatory Visit: Admitting: Psychiatry

## 2025-03-04 ENCOUNTER — Ambulatory Visit: Admitting: Psychiatry

## 2025-03-09 ENCOUNTER — Encounter: Admitting: Radiology

## 2025-03-18 ENCOUNTER — Ambulatory Visit: Admitting: Psychiatry

## 2025-05-12 ENCOUNTER — Ambulatory Visit: Admitting: Neurology
